# Patient Record
Sex: Female | Born: 1950 | Race: Black or African American | Hispanic: No | Marital: Married | Smoking: Never smoker
Health system: Southern US, Community
[De-identification: ages and names within clinical notes are randomized; demographics above are authoritative.]

## PROBLEM LIST (undated history)

## (undated) DIAGNOSIS — G238 Other specified degenerative diseases of basal ganglia: Secondary | ICD-10-CM

## (undated) HISTORY — DX: Other specified degenerative diseases of basal ganglia: G23.8

---

## 2001-09-13 ENCOUNTER — Inpatient Hospital Stay (HOSPITAL_COMMUNITY): Admission: EM | Admit: 2001-09-13 | Discharge: 2001-09-16 | Payer: Self-pay | Admitting: Emergency Medicine

## 2001-09-14 ENCOUNTER — Encounter: Payer: Self-pay | Admitting: Emergency Medicine

## 2001-09-14 ENCOUNTER — Encounter (HOSPITAL_BASED_OUTPATIENT_CLINIC_OR_DEPARTMENT_OTHER): Payer: Self-pay | Admitting: Internal Medicine

## 2001-09-15 ENCOUNTER — Encounter (HOSPITAL_BASED_OUTPATIENT_CLINIC_OR_DEPARTMENT_OTHER): Payer: Self-pay | Admitting: Internal Medicine

## 2001-09-30 ENCOUNTER — Encounter: Admission: RE | Admit: 2001-09-30 | Discharge: 2001-09-30 | Payer: Self-pay | Admitting: Internal Medicine

## 2001-10-06 ENCOUNTER — Ambulatory Visit (HOSPITAL_COMMUNITY): Admission: RE | Admit: 2001-10-06 | Discharge: 2001-10-06 | Payer: Self-pay | Admitting: Internal Medicine

## 2002-04-13 ENCOUNTER — Encounter: Admission: RE | Admit: 2002-04-13 | Discharge: 2002-04-13 | Payer: Self-pay | Admitting: Internal Medicine

## 2002-07-10 ENCOUNTER — Encounter: Admission: RE | Admit: 2002-07-10 | Discharge: 2002-07-10 | Payer: Self-pay | Admitting: Internal Medicine

## 2002-07-11 ENCOUNTER — Encounter: Admission: RE | Admit: 2002-07-11 | Discharge: 2002-07-11 | Payer: Self-pay | Admitting: Internal Medicine

## 2002-08-14 ENCOUNTER — Other Ambulatory Visit: Admission: RE | Admit: 2002-08-14 | Discharge: 2002-08-14 | Payer: Self-pay | Admitting: Internal Medicine

## 2002-08-14 ENCOUNTER — Encounter: Admission: RE | Admit: 2002-08-14 | Discharge: 2002-08-14 | Payer: Self-pay | Admitting: Internal Medicine

## 2002-10-09 ENCOUNTER — Ambulatory Visit (HOSPITAL_COMMUNITY): Admission: RE | Admit: 2002-10-09 | Discharge: 2002-10-09 | Payer: Self-pay | Admitting: Internal Medicine

## 2002-11-27 ENCOUNTER — Encounter: Admission: RE | Admit: 2002-11-27 | Discharge: 2002-11-27 | Payer: Self-pay | Admitting: Internal Medicine

## 2002-12-01 ENCOUNTER — Encounter: Admission: RE | Admit: 2002-12-01 | Discharge: 2002-12-01 | Payer: Self-pay | Admitting: Internal Medicine

## 2003-04-13 ENCOUNTER — Encounter: Admission: RE | Admit: 2003-04-13 | Discharge: 2003-04-13 | Payer: Self-pay | Admitting: Internal Medicine

## 2003-05-30 ENCOUNTER — Ambulatory Visit (HOSPITAL_COMMUNITY): Admission: RE | Admit: 2003-05-30 | Discharge: 2003-05-30 | Payer: Self-pay | Admitting: Neurology

## 2003-08-06 ENCOUNTER — Encounter: Admission: RE | Admit: 2003-08-06 | Discharge: 2003-08-06 | Payer: Self-pay | Admitting: Internal Medicine

## 2003-08-22 ENCOUNTER — Encounter: Admission: RE | Admit: 2003-08-22 | Discharge: 2003-08-22 | Payer: Self-pay | Admitting: Internal Medicine

## 2003-11-26 ENCOUNTER — Encounter (INDEPENDENT_AMBULATORY_CARE_PROVIDER_SITE_OTHER): Payer: Self-pay | Admitting: Specialist

## 2003-11-26 ENCOUNTER — Encounter: Admission: RE | Admit: 2003-11-26 | Discharge: 2003-11-26 | Payer: Self-pay | Admitting: Internal Medicine

## 2003-12-05 ENCOUNTER — Encounter: Admission: RE | Admit: 2003-12-05 | Discharge: 2003-12-05 | Payer: Self-pay | Admitting: Internal Medicine

## 2004-04-02 ENCOUNTER — Ambulatory Visit: Payer: Self-pay | Admitting: Internal Medicine

## 2004-04-15 ENCOUNTER — Ambulatory Visit: Payer: Self-pay | Admitting: Internal Medicine

## 2004-08-20 ENCOUNTER — Ambulatory Visit: Payer: Self-pay | Admitting: Internal Medicine

## 2004-09-16 ENCOUNTER — Ambulatory Visit (HOSPITAL_COMMUNITY): Admission: RE | Admit: 2004-09-16 | Discharge: 2004-09-16 | Payer: Self-pay | Admitting: Internal Medicine

## 2005-04-08 ENCOUNTER — Ambulatory Visit: Payer: Self-pay | Admitting: Internal Medicine

## 2005-04-23 ENCOUNTER — Ambulatory Visit: Payer: Self-pay | Admitting: Internal Medicine

## 2005-07-05 ENCOUNTER — Emergency Department (HOSPITAL_COMMUNITY): Admission: EM | Admit: 2005-07-05 | Discharge: 2005-07-05 | Payer: Self-pay | Admitting: Emergency Medicine

## 2005-09-24 ENCOUNTER — Ambulatory Visit (HOSPITAL_COMMUNITY): Admission: RE | Admit: 2005-09-24 | Discharge: 2005-09-24 | Payer: Self-pay | Admitting: Internal Medicine

## 2005-10-30 ENCOUNTER — Ambulatory Visit: Payer: Self-pay | Admitting: Internal Medicine

## 2005-10-30 LAB — CONVERTED CEMR LAB: Pap Smear: NEGATIVE

## 2006-02-26 ENCOUNTER — Ambulatory Visit: Payer: Self-pay | Admitting: Internal Medicine

## 2006-03-01 ENCOUNTER — Encounter (INDEPENDENT_AMBULATORY_CARE_PROVIDER_SITE_OTHER): Payer: Self-pay | Admitting: *Deleted

## 2006-03-01 ENCOUNTER — Ambulatory Visit: Payer: Self-pay | Admitting: Hospitalist

## 2006-03-01 LAB — CONVERTED CEMR LAB
Alkaline Phosphatase: 58 units/L (ref 39–117)
BUN: 23 mg/dL (ref 6–23)
CO2: 19 meq/L (ref 19–32)
Cholesterol: 103 mg/dL (ref 0–200)
Creatinine, Ser: 1.43 mg/dL — ABNORMAL HIGH (ref 0.40–1.20)
Glucose, Bld: 179 mg/dL — ABNORMAL HIGH (ref 70–99)
HDL: 30 mg/dL — ABNORMAL LOW (ref >39)
Sodium: 144 meq/L (ref 135–145)
Total Bilirubin: 0.2 mg/dL — ABNORMAL LOW (ref 0.3–1.2)
Total Protein: 7.4 g/dL (ref 6.0–8.3)
Triglycerides: 71 mg/dL (ref <150)
VLDL: 14 mg/dL (ref 0–40)

## 2006-05-14 ENCOUNTER — Telehealth: Payer: Self-pay | Admitting: *Deleted

## 2006-06-03 ENCOUNTER — Encounter (INDEPENDENT_AMBULATORY_CARE_PROVIDER_SITE_OTHER): Payer: Self-pay | Admitting: *Deleted

## 2006-06-03 ENCOUNTER — Ambulatory Visit: Payer: Self-pay | Admitting: Internal Medicine

## 2006-06-03 DIAGNOSIS — G238 Other specified degenerative diseases of basal ganglia: Secondary | ICD-10-CM

## 2006-06-03 DIAGNOSIS — R809 Proteinuria, unspecified: Secondary | ICD-10-CM | POA: Insufficient documentation

## 2006-06-03 DIAGNOSIS — K921 Melena: Secondary | ICD-10-CM

## 2006-06-03 LAB — CONVERTED CEMR LAB
Albumin: 4.6 g/dL (ref 3.5–5.2)
BUN: 14 mg/dL (ref 6–23)
CO2: 24 meq/L (ref 19–32)
Glucose, Bld: 145 mg/dL — ABNORMAL HIGH (ref 70–99)
Sodium: 140 meq/L (ref 135–145)
Total Bilirubin: 0.3 mg/dL (ref 0.3–1.2)
Total Protein: 7.5 g/dL (ref 6.0–8.3)

## 2006-06-14 ENCOUNTER — Ambulatory Visit: Payer: Self-pay | Admitting: Hospitalist

## 2006-09-13 ENCOUNTER — Telehealth (INDEPENDENT_AMBULATORY_CARE_PROVIDER_SITE_OTHER): Payer: Self-pay | Admitting: *Deleted

## 2006-09-27 ENCOUNTER — Ambulatory Visit: Payer: Self-pay | Admitting: Internal Medicine

## 2006-09-27 ENCOUNTER — Encounter (INDEPENDENT_AMBULATORY_CARE_PROVIDER_SITE_OTHER): Payer: Self-pay | Admitting: *Deleted

## 2006-09-27 LAB — CONVERTED CEMR LAB
Blood Glucose, Fingerstick: 222
Hgb A1c MFr Bld: 7.5 %

## 2006-09-28 ENCOUNTER — Encounter (INDEPENDENT_AMBULATORY_CARE_PROVIDER_SITE_OTHER): Payer: Self-pay | Admitting: *Deleted

## 2006-09-28 LAB — CONVERTED CEMR LAB
BUN: 23 mg/dL (ref 6–23)
CO2: 18 meq/L — ABNORMAL LOW (ref 19–32)
Chloride: 111 meq/L (ref 96–112)
Creatinine, Ser: 1.39 mg/dL — ABNORMAL HIGH (ref 0.40–1.20)
Creatinine, Urine: 73.8 mg/dL

## 2006-09-30 ENCOUNTER — Ambulatory Visit (HOSPITAL_COMMUNITY): Admission: RE | Admit: 2006-09-30 | Discharge: 2006-09-30 | Payer: Self-pay | Admitting: Internal Medicine

## 2006-10-01 ENCOUNTER — Encounter (INDEPENDENT_AMBULATORY_CARE_PROVIDER_SITE_OTHER): Payer: Self-pay | Admitting: *Deleted

## 2006-10-01 ENCOUNTER — Ambulatory Visit: Payer: Self-pay | Admitting: Internal Medicine

## 2006-10-03 LAB — CONVERTED CEMR LAB
BUN: 39 mg/dL — ABNORMAL HIGH (ref 6–23)
Creatinine, Ser: 1.64 mg/dL — ABNORMAL HIGH (ref 0.40–1.20)

## 2006-10-04 ENCOUNTER — Encounter (INDEPENDENT_AMBULATORY_CARE_PROVIDER_SITE_OTHER): Payer: Self-pay | Admitting: *Deleted

## 2006-10-04 ENCOUNTER — Ambulatory Visit: Payer: Self-pay | Admitting: Internal Medicine

## 2006-10-04 LAB — CONVERTED CEMR LAB
Creatinine, Urine: 89.4 mg/dL
Nitrite: POSITIVE — AB
Sodium, Ur: 78 meq/L
Specific Gravity, Urine: 1.015 (ref 1.005–1.03)
Urobilinogen, UA: 0.2 (ref 0.0–1.0)

## 2006-10-05 LAB — CONVERTED CEMR LAB
Potassium: 4.6 meq/L (ref 3.5–5.3)
Sodium: 142 meq/L (ref 135–145)

## 2006-10-06 ENCOUNTER — Ambulatory Visit: Payer: Self-pay | Admitting: Internal Medicine

## 2006-10-06 ENCOUNTER — Ambulatory Visit (HOSPITAL_COMMUNITY): Admission: RE | Admit: 2006-10-06 | Discharge: 2006-10-06 | Payer: Self-pay | Admitting: Internal Medicine

## 2006-10-06 ENCOUNTER — Encounter (INDEPENDENT_AMBULATORY_CARE_PROVIDER_SITE_OTHER): Payer: Self-pay | Admitting: *Deleted

## 2006-12-15 ENCOUNTER — Telehealth: Payer: Self-pay | Admitting: *Deleted

## 2006-12-17 ENCOUNTER — Ambulatory Visit: Payer: Self-pay | Admitting: Internal Medicine

## 2006-12-17 ENCOUNTER — Telehealth: Payer: Self-pay | Admitting: *Deleted

## 2006-12-17 ENCOUNTER — Encounter (INDEPENDENT_AMBULATORY_CARE_PROVIDER_SITE_OTHER): Payer: Self-pay | Admitting: *Deleted

## 2006-12-17 LAB — CONVERTED CEMR LAB
CO2: 21 meq/L (ref 19–32)
Calcium: 10.4 mg/dL (ref 8.4–10.5)
Creatinine, Ser: 1.25 mg/dL — ABNORMAL HIGH (ref 0.40–1.20)

## 2006-12-29 ENCOUNTER — Ambulatory Visit: Payer: Self-pay | Admitting: Internal Medicine

## 2006-12-29 ENCOUNTER — Encounter (INDEPENDENT_AMBULATORY_CARE_PROVIDER_SITE_OTHER): Payer: Self-pay | Admitting: *Deleted

## 2007-03-28 ENCOUNTER — Ambulatory Visit: Payer: Self-pay | Admitting: Internal Medicine

## 2007-03-28 ENCOUNTER — Encounter (INDEPENDENT_AMBULATORY_CARE_PROVIDER_SITE_OTHER): Payer: Self-pay | Admitting: *Deleted

## 2007-03-28 LAB — CONVERTED CEMR LAB
BUN: 18 mg/dL (ref 6–23)
CO2: 19 meq/L (ref 19–32)
Chloride: 112 meq/L (ref 96–112)
Creatinine, Ser: 1.28 mg/dL — ABNORMAL HIGH (ref 0.40–1.20)
Glucose, Bld: 314 mg/dL — ABNORMAL HIGH (ref 70–99)
Hgb A1c MFr Bld: 7.3 %

## 2007-07-11 ENCOUNTER — Encounter (INDEPENDENT_AMBULATORY_CARE_PROVIDER_SITE_OTHER): Payer: Self-pay | Admitting: *Deleted

## 2007-07-11 ENCOUNTER — Ambulatory Visit: Payer: Self-pay | Admitting: Infectious Diseases

## 2007-07-11 LAB — CONVERTED CEMR LAB: Hgb A1c MFr Bld: 8 %

## 2007-07-22 ENCOUNTER — Encounter (INDEPENDENT_AMBULATORY_CARE_PROVIDER_SITE_OTHER): Payer: Self-pay | Admitting: *Deleted

## 2007-07-22 ENCOUNTER — Ambulatory Visit: Payer: Self-pay | Admitting: Hospitalist

## 2007-07-25 LAB — CONVERTED CEMR LAB
BUN: 27 mg/dL — ABNORMAL HIGH (ref 6–23)
CO2: 18 meq/L — ABNORMAL LOW (ref 19–32)
Calcium: 10.4 mg/dL (ref 8.4–10.5)
Creatinine, Ser: 1.39 mg/dL — ABNORMAL HIGH (ref 0.40–1.20)
Glucose, Bld: 183 mg/dL — ABNORMAL HIGH (ref 70–99)

## 2007-07-29 ENCOUNTER — Telehealth (INDEPENDENT_AMBULATORY_CARE_PROVIDER_SITE_OTHER): Payer: Self-pay | Admitting: *Deleted

## 2007-09-22 ENCOUNTER — Encounter (INDEPENDENT_AMBULATORY_CARE_PROVIDER_SITE_OTHER): Payer: Self-pay | Admitting: *Deleted

## 2007-10-04 ENCOUNTER — Ambulatory Visit (HOSPITAL_COMMUNITY): Admission: RE | Admit: 2007-10-04 | Discharge: 2007-10-04 | Payer: Self-pay | Admitting: Internal Medicine

## 2007-10-24 ENCOUNTER — Encounter (INDEPENDENT_AMBULATORY_CARE_PROVIDER_SITE_OTHER): Payer: Self-pay | Admitting: *Deleted

## 2007-10-24 ENCOUNTER — Ambulatory Visit: Payer: Self-pay | Admitting: Internal Medicine

## 2007-10-24 DIAGNOSIS — N949 Unspecified condition associated with female genital organs and menstrual cycle: Secondary | ICD-10-CM | POA: Insufficient documentation

## 2007-10-24 LAB — CONVERTED CEMR LAB
Blood Glucose, Fingerstick: 211
Hgb A1c MFr Bld: 7.5 %

## 2008-01-30 ENCOUNTER — Telehealth (INDEPENDENT_AMBULATORY_CARE_PROVIDER_SITE_OTHER): Payer: Self-pay | Admitting: Internal Medicine

## 2008-02-16 ENCOUNTER — Encounter (INDEPENDENT_AMBULATORY_CARE_PROVIDER_SITE_OTHER): Payer: Self-pay | Admitting: Internal Medicine

## 2008-02-16 ENCOUNTER — Ambulatory Visit: Payer: Self-pay | Admitting: Internal Medicine

## 2008-02-16 DIAGNOSIS — G4700 Insomnia, unspecified: Secondary | ICD-10-CM | POA: Insufficient documentation

## 2008-02-16 LAB — CONVERTED CEMR LAB
Blood Glucose, Fingerstick: 359
Hgb A1c MFr Bld: 8 %

## 2008-02-21 ENCOUNTER — Ambulatory Visit: Payer: Self-pay | Admitting: Internal Medicine

## 2008-02-21 LAB — CONVERTED CEMR LAB: Blood Glucose, Fingerstick: 196

## 2008-03-07 ENCOUNTER — Encounter (INDEPENDENT_AMBULATORY_CARE_PROVIDER_SITE_OTHER): Payer: Self-pay | Admitting: Internal Medicine

## 2008-03-15 ENCOUNTER — Telehealth (INDEPENDENT_AMBULATORY_CARE_PROVIDER_SITE_OTHER): Payer: Self-pay | Admitting: *Deleted

## 2008-03-19 ENCOUNTER — Encounter (INDEPENDENT_AMBULATORY_CARE_PROVIDER_SITE_OTHER): Payer: Self-pay | Admitting: Internal Medicine

## 2008-03-26 ENCOUNTER — Ambulatory Visit: Payer: Self-pay | Admitting: Internal Medicine

## 2008-03-29 ENCOUNTER — Encounter (INDEPENDENT_AMBULATORY_CARE_PROVIDER_SITE_OTHER): Payer: Self-pay | Admitting: Internal Medicine

## 2008-04-02 ENCOUNTER — Telehealth (INDEPENDENT_AMBULATORY_CARE_PROVIDER_SITE_OTHER): Payer: Self-pay | Admitting: Internal Medicine

## 2008-04-03 ENCOUNTER — Encounter (INDEPENDENT_AMBULATORY_CARE_PROVIDER_SITE_OTHER): Payer: Self-pay | Admitting: Internal Medicine

## 2008-06-08 ENCOUNTER — Ambulatory Visit: Payer: Self-pay | Admitting: Internal Medicine

## 2008-06-08 DIAGNOSIS — F329 Major depressive disorder, single episode, unspecified: Secondary | ICD-10-CM | POA: Insufficient documentation

## 2008-06-08 LAB — CONVERTED CEMR LAB
Blood Glucose, Fingerstick: 267
Hgb A1c MFr Bld: 8.7 %

## 2008-07-09 ENCOUNTER — Ambulatory Visit: Payer: Self-pay | Admitting: Internal Medicine

## 2008-07-09 LAB — CONVERTED CEMR LAB: Blood Glucose, Fingerstick: 186

## 2008-09-07 ENCOUNTER — Encounter (INDEPENDENT_AMBULATORY_CARE_PROVIDER_SITE_OTHER): Payer: Self-pay | Admitting: Internal Medicine

## 2008-09-07 ENCOUNTER — Ambulatory Visit: Payer: Self-pay | Admitting: *Deleted

## 2008-09-07 LAB — CONVERTED CEMR LAB: Blood Glucose, Fingerstick: 188

## 2008-09-10 LAB — CONVERTED CEMR LAB
Calcium: 11 mg/dL — ABNORMAL HIGH (ref 8.4–10.5)
Cholesterol: 127 mg/dL (ref 0–200)
Creatinine, Ser: 1.4 mg/dL — ABNORMAL HIGH (ref 0.40–1.20)
Creatinine, Urine: 83 mg/dL
GFR calc Af Amer: 46 mL/min — ABNORMAL LOW (ref >60)
Hemoglobin: 11.2 g/dL — ABNORMAL LOW (ref 12.0–15.0)
Ketones, ur: NEGATIVE mg/dL
LDL Cholesterol: 74 mg/dL (ref 0–99)
MCHC: 32.7 g/dL (ref 30.0–36.0)
MCV: 83 fL (ref 78.0–100.0)
Microalb, Ur: 5.84 mg/dL — ABNORMAL HIGH (ref 0.00–1.89)
Nitrite: NEGATIVE
RBC / HPF: NONE SEEN (ref <3)
RDW: 13.8 % (ref 11.5–15.5)
Sodium: 143 meq/L (ref 135–145)
Specific Gravity, Urine: 1.013 (ref 1.005–1.030)
Triglycerides: 117 mg/dL (ref <150)
Urobilinogen, UA: 0.2 (ref 0.0–1.0)
VLDL: 23 mg/dL (ref 0–40)
pH: 6 (ref 5.0–8.0)

## 2008-09-14 ENCOUNTER — Ambulatory Visit: Payer: Self-pay | Admitting: Infectious Disease

## 2008-09-14 LAB — FECAL OCCULT BLOOD, GUAIAC: Fecal Occult Blood: NEGATIVE

## 2008-09-27 LAB — CONVERTED CEMR LAB: OCCULT 2: NEGATIVE

## 2008-10-08 ENCOUNTER — Ambulatory Visit (HOSPITAL_COMMUNITY): Admission: RE | Admit: 2008-10-08 | Discharge: 2008-10-08 | Payer: Self-pay | Admitting: Internal Medicine

## 2008-10-08 LAB — HM MAMMOGRAPHY: HM Mammogram: NEGATIVE

## 2008-10-25 ENCOUNTER — Encounter (INDEPENDENT_AMBULATORY_CARE_PROVIDER_SITE_OTHER): Payer: Self-pay | Admitting: Internal Medicine

## 2008-10-25 ENCOUNTER — Ambulatory Visit: Payer: Self-pay | Admitting: Internal Medicine

## 2008-10-25 LAB — CONVERTED CEMR LAB: Calcium: 10.1 mg/dL (ref 8.4–10.5)

## 2009-01-16 ENCOUNTER — Encounter (INDEPENDENT_AMBULATORY_CARE_PROVIDER_SITE_OTHER): Payer: Self-pay | Admitting: Internal Medicine

## 2009-03-11 ENCOUNTER — Encounter (INDEPENDENT_AMBULATORY_CARE_PROVIDER_SITE_OTHER): Payer: Self-pay | Admitting: Internal Medicine

## 2009-03-18 ENCOUNTER — Ambulatory Visit (HOSPITAL_COMMUNITY): Admission: RE | Admit: 2009-03-18 | Discharge: 2009-03-18 | Payer: Self-pay | Admitting: Internal Medicine

## 2009-03-18 ENCOUNTER — Encounter (INDEPENDENT_AMBULATORY_CARE_PROVIDER_SITE_OTHER): Payer: Self-pay | Admitting: Internal Medicine

## 2009-03-19 ENCOUNTER — Encounter (INDEPENDENT_AMBULATORY_CARE_PROVIDER_SITE_OTHER): Payer: Self-pay | Admitting: Internal Medicine

## 2009-03-19 LAB — HM DIABETES EYE EXAM

## 2009-04-04 ENCOUNTER — Telehealth: Payer: Self-pay | Admitting: *Deleted

## 2009-04-04 DIAGNOSIS — M949 Disorder of cartilage, unspecified: Secondary | ICD-10-CM

## 2009-04-04 DIAGNOSIS — M899 Disorder of bone, unspecified: Secondary | ICD-10-CM | POA: Insufficient documentation

## 2009-04-11 ENCOUNTER — Ambulatory Visit: Payer: Self-pay | Admitting: Internal Medicine

## 2009-04-11 ENCOUNTER — Encounter (INDEPENDENT_AMBULATORY_CARE_PROVIDER_SITE_OTHER): Payer: Self-pay | Admitting: Internal Medicine

## 2009-04-11 DIAGNOSIS — D649 Anemia, unspecified: Secondary | ICD-10-CM | POA: Insufficient documentation

## 2009-04-11 LAB — CONVERTED CEMR LAB
Bilirubin Urine: NEGATIVE
Blood Glucose, Fingerstick: 231
Glucose, Urine, Semiquant: 100
Hgb A1c MFr Bld: 8.6 %
Specific Gravity, Urine: 1.015

## 2009-04-16 LAB — CONVERTED CEMR LAB
Bilirubin Urine: NEGATIVE
CO2: 18 meq/L — ABNORMAL LOW (ref 19–32)
Casts: NONE SEEN /lpf
Ferritin: 22 ng/mL (ref 10–291)
Glucose, Bld: 274 mg/dL — ABNORMAL HIGH (ref 70–99)
Ketones, ur: NEGATIVE mg/dL
MCHC: 33.1 g/dL (ref 30.0–36.0)
Microalb Creat Ratio: 136.5 mg/g — ABNORMAL HIGH (ref 0.0–30.0)
Nitrite: NEGATIVE
Potassium: 4.1 meq/L (ref 3.5–5.3)
RBC / HPF: NONE SEEN (ref <3)
RBC: 3.94 M/uL (ref 3.87–5.11)
Sodium: 140 meq/L (ref 135–145)
TSH: 2.948 microintl units/mL (ref 0.350–4.5)
Vitamin B-12: 422 pg/mL (ref 211–911)
WBC: 9.7 10*3/uL (ref 4.0–10.5)
pH: 5.5 (ref 5.0–8.0)

## 2009-04-29 ENCOUNTER — Ambulatory Visit: Payer: Self-pay | Admitting: Internal Medicine

## 2009-04-29 LAB — CONVERTED CEMR LAB
Blood Glucose, Fingerstick: 218
Calcium: 10.8 mg/dL — ABNORMAL HIGH (ref 8.4–10.5)
Creatinine, Ser: 1.43 mg/dL — ABNORMAL HIGH (ref 0.40–1.20)

## 2009-05-01 ENCOUNTER — Telehealth: Payer: Self-pay | Admitting: *Deleted

## 2009-05-01 ENCOUNTER — Telehealth (INDEPENDENT_AMBULATORY_CARE_PROVIDER_SITE_OTHER): Payer: Self-pay | Admitting: Internal Medicine

## 2009-05-03 ENCOUNTER — Telehealth (INDEPENDENT_AMBULATORY_CARE_PROVIDER_SITE_OTHER): Payer: Self-pay | Admitting: *Deleted

## 2009-05-06 ENCOUNTER — Telehealth (INDEPENDENT_AMBULATORY_CARE_PROVIDER_SITE_OTHER): Payer: Self-pay | Admitting: Internal Medicine

## 2009-05-15 ENCOUNTER — Encounter (INDEPENDENT_AMBULATORY_CARE_PROVIDER_SITE_OTHER): Payer: Self-pay | Admitting: Internal Medicine

## 2009-05-17 ENCOUNTER — Ambulatory Visit: Payer: Self-pay | Admitting: Internal Medicine

## 2009-05-17 LAB — CONVERTED CEMR LAB
AST: 20 units/L (ref 0–37)
Albumin: 4.2 g/dL (ref 3.5–5.2)
Alkaline Phosphatase: 156 units/L — ABNORMAL HIGH (ref 39–117)
Blood Glucose, Fingerstick: 305
LDL Cholesterol: 55 mg/dL (ref 0–99)
Potassium: 4.4 meq/L (ref 3.5–5.3)
Sodium: 140 meq/L (ref 135–145)
Total Bilirubin: 0.4 mg/dL (ref 0.3–1.2)
Total Protein: 7.2 g/dL (ref 6.0–8.3)
VLDL: 18 mg/dL (ref 0–40)

## 2009-05-20 ENCOUNTER — Telehealth (INDEPENDENT_AMBULATORY_CARE_PROVIDER_SITE_OTHER): Payer: Self-pay | Admitting: Internal Medicine

## 2009-05-24 ENCOUNTER — Telehealth (INDEPENDENT_AMBULATORY_CARE_PROVIDER_SITE_OTHER): Payer: Self-pay | Admitting: Internal Medicine

## 2009-05-29 ENCOUNTER — Encounter (INDEPENDENT_AMBULATORY_CARE_PROVIDER_SITE_OTHER): Payer: Self-pay | Admitting: Internal Medicine

## 2009-05-30 ENCOUNTER — Encounter (INDEPENDENT_AMBULATORY_CARE_PROVIDER_SITE_OTHER): Payer: Self-pay | Admitting: Internal Medicine

## 2009-06-04 ENCOUNTER — Telehealth (INDEPENDENT_AMBULATORY_CARE_PROVIDER_SITE_OTHER): Payer: Self-pay | Admitting: Internal Medicine

## 2009-06-18 ENCOUNTER — Encounter (INDEPENDENT_AMBULATORY_CARE_PROVIDER_SITE_OTHER): Payer: Self-pay | Admitting: Internal Medicine

## 2009-09-03 ENCOUNTER — Telehealth (INDEPENDENT_AMBULATORY_CARE_PROVIDER_SITE_OTHER): Payer: Self-pay | Admitting: Internal Medicine

## 2010-05-08 ENCOUNTER — Telehealth: Payer: Self-pay | Admitting: Internal Medicine

## 2010-05-25 LAB — CONVERTED CEMR LAB
CO2: 20 meq/L (ref 19–32)
Calcium: 10.2 mg/dL (ref 8.4–10.5)
Glucose, Bld: 325 mg/dL — ABNORMAL HIGH (ref 70–99)
Sodium: 140 meq/L (ref 135–145)

## 2010-05-27 NOTE — Procedures (Signed)
Summary: [facility]: Colonoscopy  [facility]   Imported By: Florinda Marker 05/22/2009 14:17:25  _____________________________________________________________________  External Attachment:    Type:   Image     Comment:   External Document

## 2010-05-27 NOTE — Progress Notes (Signed)
Summary: new to insulin/diabete support/dmr  Phone Note Outgoing Call   Call placed by: Jamison Neighbor RD,CDE,  May 03, 2009 10:37 AM Summary of Call: called to follow-up on new to insulin and blood sugars: "doing good" per patient, CBGs in ams: 285 first day- but hadn't taken insulin yet,  05/01/08(first day after 9 units)- 238,194  05/02/08- 225 Discussed with patient that she might need to increase her dose. offered to route this note to discuss insulin increase with Dr Clent Ridges- However, patient  wants to wait until next week to see if CBgs decrease further. She agreed to call me early next week with additional CBg readings.

## 2010-05-27 NOTE — Progress Notes (Signed)
Summary: blood sugars improving on new dose/dmr  Phone Note Outgoing Call   Call placed by: Jamison Neighbor RD,CDE,  May 24, 2009 9:37 AM Summary of Call: returned patient call about blood sugars: using 25 units lantus instead of 24 by mistake. CBgs are as follows: all before meals 1/28=  82 this am 1/27=154, 263, 137 1/26= 278, 313, 223  advised her to continue with 25 units and call if symptoms of hypoglycemia- that are relieved with caloric intake no matter what her blood sugar. (Reviewed symptoms), or nay other questions or concerns. She did ask that we check into her pen needle prescription because she had to pay 44.00 for the 100 needles.    Follow-up for Phone Call         [facility] called [facility] [address] and spoke to pharmacist who said Genifer only paid 4 dollars for the pen needles and the way the prescription is written is fine. Follow-up by: Jamison Neighbor RD,CDE,  May 24, 2009 11:09 AM  Additional Follow-up for Phone Call Additional follow up Details #1::        cbg's sound great. thank you donna for speaking with Ms. Tobler about her cbg's and for following up on the pen needle question.

## 2010-05-27 NOTE — Progress Notes (Signed)
Summary: CBGs high/new lantus dose/dmr  Phone Note Call from Patient   Caller: Patient Summary of Call: patient called early to see if she could have her insulin adjusted today because her sugar is still high:  CBgs 340 this am before eating or drinking, yesterday evening 321 Sunday:am 285, 321- went to churchand fixed dinner, watched TV. Saturday: 302, 197 evening- more physical activity- went to mall, shopped.  Initial call taken by: Jamison Neighbor RD,CDE,  May 06, 2009 9:18 AM  Follow-up for Phone Call        disussed with Dr. Clent Ridges who is in clinic today who advised that patient  increase insulin to 15 units daily. Follow-up by: Jamison Neighbor RD,CDE,  May 06, 2009 9:22 AM  Additional Follow-up for Phone Call Additional follow up Details #1::        Called patient- she repeated back new insulin dose of 15 units- understands to give it 3-5 days for full effectiveness and call us if blood sugars still not satisfactory Additional Follow-up by: Jamison Neighbor RD,CDE,  May 06, 2009 9:24 AM    Additional Follow-up for Phone Call Additional follow up Details #2::    Thank you Lupita Leash.  I agree with this plan.  New/Updated Medications: LANTUS SOLOSTAR 100 UNIT/ML SOLN (INSULIN GLARGINE) Inject 15 units once a day at the same time each day.

## 2010-05-27 NOTE — Assessment & Plan Note (Signed)
Summary: med questions/pcp-Alaja Goldinger/hla   Vital Signs:  Patient profile:   60 year old female Height:      62 inches (157.48 cm) Weight:      135.7 pounds (61.68 kg) BMI:     24.91 Temp:     97.8 degrees F (36.56 degrees C) oral Pulse rate:   86 / minute BP sitting:   109 / 77  (right arm)  Vitals Entered By: Stanton Kidney Ditzler RN (April 29, 2009 2:21 PM) Is Patient Diabetic? Yes Did you bring your meter with you today? No Pain Assessment Patient in pain? no      Nutritional Status BMI of 19 -24 = normal Nutritional Status Detail appetite good CBG Result 218  Have you ever been in a relationship where you felt threatened, hurt or afraid?denies   Does patient need assistance? Functional Status Self care Ambulation Normal Comments Discuss meds.   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  I have recently stopped her metformin due to concern about metabolic acidosis and renal insufficiency.  I then increased her glypizide to control cbg's.  I also increased her lisinopril in response to a high microalb/cr ratio.  She stopped the metformin and then her cbg's went out of control, so she started taking it again.  She wants to know what to do.     Depression History:     The patient denies a depressed mood most of the day and a diminished interest in her usual daily activities.         Preventive Screening-Counseling & Management  Alcohol-Tobacco     Alcohol drinks/day: 0     Smoking Status: quit     Year Quit: 1969  Caffeine-Diet-Exercise     Does Patient Exercise: yes     Type  of exercise: walking  Current Medications (verified): 1)  Lisinopril 10 Mg Tabs (Lisinopril) .... Take One Tablet Daily. 2)  Aspir-Low 81 Mg Tbec (Aspirin) .... Take 1 Tablet By Mouth Once A Day 3)  Bayer Contour Test  Strp (Glucose Blood) 4)  Glipizide 10 Mg Tabs (Glipizide) .... Take One Tablet Two Times A Day For Diabetes. 5)  Oyster Calcium + D 500-125 Mg-Unit Tabs (Calcium-Vitamin D) .... Take Two Tablets Daily. 6)  Ferrous Sulfate 325 (65 Fe) Mg Tabs (Ferrous Sulfate) .... Take One Tablet Two Times A Day.  Allergies: No Known Drug Allergies  Review of Systems       per hpi  Physical Exam  General:  no exam.  Diabetes Management Exam:    Foot Exam (with socks and/or shoes not present):       Sensory-Monofilament:          Left foot: normal          Right foot: normal   Impression & Recommendations:  Problem # 1:  DIABETES MELLITUS, TYPE II (ICD-250.00) Off metformin due to metabolic acidosis and renal dysfunction. Glypizide alone not controling cbg's. Will stop pills and start insulin today. start lantus at 0.15u/kgx 61kg= 9 units daily. rtc in 2 weeks to see how it is going. keep checking cbg's two times a day. visit with DR asap to go over insulin use. BMET today to be sure lisinopril is not worsening CRI.  The following medications were removed from the medication list:    Glipizide 10 Mg Tabs (Glipizide) .Marland Kitchen... Take one tablet two times a day for diabetes. Her updated medication list for this problem includes:    Lisinopril 10 Mg Tabs (Lisinopril) .Marland Kitchen... Take one tablet daily.    Aspir-low 81 Mg Tbec (Aspirin) .Marland Kitchen... Take 1 tablet by mouth once  a day    Lantus Solostar 100 Unit/ml Soln (Insulin glargine) ..... Inject 9 units once a day at the same time each day.  Orders: Capillary Blood Glucose/CBG ([address]) Diabetic Clinic Referral (Diabetic) T-Basic Metabolic Panel ([id number])  Problem # 2:  RENAL INSUFFICIENCY, CHRONIC (ICD-585.9) Cr increased and nongap acidosis.  CRI likely due to DM.  Have stopped metformin.  Concerned that ACE will worsen CRI, but may help protenuria.  Will monitor Cr carefully.  Labs Reviewed: BUN: 15 (04/11/2009)   Cr: 1.24 (04/11/2009)    Hgb: 11.2 (04/11/2009)   Hct: 33.8 (04/11/2009)   Ca++: 10.5 (04/11/2009)    TP: 7.5 (06/03/2006)   Alb: 4.6 (06/03/2006)  Complete Medication List: 1)  Lisinopril 10 Mg Tabs (Lisinopril) .... Take one tablet daily. 2)  Aspir-low 81 Mg Tbec (Aspirin) .... Take 1 tablet by mouth once a day 3)  Bayer Contour Test Strp (Glucose blood) 4)  Oyster Calcium + D 500-125 Mg-unit Tabs (Calcium-vitamin d) .... Take two tablets daily. 5)  Ferrous Sulfate 325 (65 Fe) Mg Tabs (Ferrous sulfate) .... Take one tablet two times a day. 6)  Lantus Solostar 100 Unit/ml Soln (Insulin glargine) .... Inject 9 units once a day at the same time each day.  Patient Instructions: 1)  Please make a follow up appointment in 2 weeks. 2)  Please make an appointment with Jamison Neighbor as soon as possible. 3)  You have a new prescription for Lantus insulin.  You should inject 9 units at the same time each day.  4)  Stop taking metformin and glypizide. 5)  Continue to check your blood sugar three times a day. Prescriptions: LANTUS SOLOSTAR 100  UNIT/ML SOLN (INSULIN GLARGINE) Inject 9 units once a day at the same time each day.  #1 box x 3   Entered and Authorized by:   Elby Showers MD   Signed by:   Elby Showers MD on 04/29/2009   Method used:   [facility]        [facility]  [address] [phone]* (retail)       [address]       [place], [place]   [id number]       Ph: [phone] or [phone]       Fax: [phone]   RxID:   [id number]  Process Orders Check Orders Results:     [facility]: ABN not required for this insurance Tests Sent for requisitioning (April 29, 2009 3:12 PM):     04/29/2009: [facility] -- T-Basic Metabolic Panel [phone] (signed)    Prevention & Chronic Care Immunizations   Influenza vaccine: Not documented    Tetanus booster: Not documented    Pneumococcal vaccine: Not documented    H. zoster vaccine: Not documented  Colorectal Screening   Hemoccult: Not documented    Colonoscopy: Not documented   Colonoscopy action/deferral: GI referral  (10/25/2008)  Other Screening   Pap smear: NEGATIVE FOR INTRAEPITHELIAL LESIONS OR MALIGNANCY. BENIGN REACTIVE/REPARATIVE CHANGES  (10/24/2007)   Pap smear due: 10/2007    Mammogram: ASSESSMENT: Negative - BI-RADS 1^MM DIGITAL SCREENING  (10/08/2008)   Mammogram due: 09/2007    DXA bone density scan: Not documented   DXA bone density action/deferral: Ordered  (10/25/2008)   Smoking status: quit  (04/29/2009)  Diabetes Mellitus   HgbA1C: 8.6  (04/11/2009)   HgbA1C action/deferral: Ordered  (10/25/2008)    Eye exam: No diabetic retinopathy.     (03/19/2009)   Diabetic eye exam action/deferral: Ophthalmology referral  (10/25/2008)   Eye exam due: 03/2010    Foot exam: yes  (04/29/2009)   Foot exam action/deferral: Not indicated   High risk foot: Not documented   Foot care education: Not documented   Foot exam due: 10/25/2009    Urine microalbumin/creatinine ratio: 136.5  (04/11/2009)  Lipids   Total Cholesterol: 127  (09/07/2008)   Lipid panel action/deferral: Not indicated   LDL: 74  (09/07/2008)   LDL Direct: Not documented   HDL: 30  (09/07/2008)   Triglycerides: 117  (09/07/2008)  Self-Management Support :    Patient will work on the following items until the next clinic visit to reach  self-care goals:     Medications and monitoring: take my medicines every day, check my blood sugar, check my blood pressure, weigh myself weekly, examine my feet every day  (04/29/2009)     Eating: drink diet soda or water instead of juice or soda, eat more vegetables, use fresh or frozen vegetables, eat foods that are low in salt, eat baked foods instead of fried foods, eat fruit for snacks and desserts  (04/29/2009)     Activity: take a 30 minute walk every day, take the stairs instead of the elevator, park at the far end of the parking lot  (04/29/2009)    Diabetes self-management support: Not documented   Last diabetes self-management training by diabetes educator: 06/15/2008   Referred for diabetes self-mgmt training.   Last medical nutrition therapy: 06/08/2008  Last LDL:  74 (09/07/2008 9:55:00 PM)        Diabetic Foot Exam Foot Inspection Is there a history of a foot ulcer?              No Is there a foot ulcer now?              No Can the patient see the bottom of their feet?          Yes Are the shoes appropriate in style and fit?          Yes Is there swelling or an abnormal foot shape?          No Are the toenails long?                No Are the toenails thick?                No Are the toenails ingrown?              No Is there heavy callous build-up?              No Is there a claw toe deformity?                          No Is there elevated skin temperature?            No Is there limited ankle dorsiflexion?            No Is there foot or ankle muscle weakness?            No Do you have pain in calf while walking?           No         10-g (5.07) Semmes-Weinstein Monofilament Test Performed by: Stanton Kidney Ditzler RN          Right Foot          Left Foot Visual Inspection     normal         normal Test Control      normal         normal Site 1         normal         normal Site 2         normal         normal Site 3          normal         normal Site 4         normal         normal Site 5         normal         normal Site 6         normal         normal Site 7         normal         normal Site 8         normal         normal Site 9         normal         normal Site 10         normal         normal  Impression      normal         normal   Appended Document: med questions/pcp-Talise Sligh/hla    Clinical Lists Changes  Medications: Added  new medication of PEN NEEDLES 5/16" 31G X 8 MM MISC (INSULIN PEN NEEDLE) use to inject insulin once daily- do not leave needle on pen, do not reuse. - Signed Rx of PEN NEEDLES 5/16" 31G X 8 MM MISC (INSULIN PEN NEEDLE) use to inject insulin once daily- do not leave needle on pen, do not reuse.;  #100 x 3;  Signed;  Entered by: Jamison Neighbor RD,CDE;  Authorized by: Elby Showers MD;  Method used: Electronically to [facility] [address], [place], [place]  [id number], Ph: [phone], Fax: [phone]    Prescriptions: PEN NEEDLES 5/16" 31G X 8 MM MISC (INSULIN PEN NEEDLE) use to inject insulin once daily- do not leave needle on pen, do not reuse.  #100 x 3   Entered by:   Jamison Neighbor RD,CDE   Authorized by:   Elby Showers MD   Signed by:   Elby Showers MD on 04/29/2009   Method used:   Electronically to        [facility] (retail)       [address]       [place], [place]  [address]       Ph: [phone]       Fax: [phone]   RxID:   [id number]

## 2010-05-27 NOTE — Progress Notes (Signed)
Summary: refill/gg  Phone Note Refill Request  on June 04, 2009 2:24 PM  Refills Requested: Medication #1:  LISINOPRIL 10 MG TABS Take one tablet daily.  Method Requested: Electronic Initial call taken by: Merrie Roof RN,  June 04, 2009 2:25 PM    Prescriptions: LISINOPRIL 10 MG TABS (LISINOPRIL) Take one tablet daily.  #32 x 2   Entered and Authorized by:   Elby Showers MD   Signed by:   Elby Showers MD on 06/05/2009   Method used:   [facility]        [facility]  [address] [phone]* (retail)       [address] [place] [address]       [place], [place]  [id number]       Ph: [phone] or [phone]       Fax: [phone]   RxID:   [id number] LISINOPRIL 10 MG TABS (LISINOPRIL) Take one tablet daily.  #32 x 2   Entered and Authorized by:   Elby Showers MD   Signed by:   Elby Showers MD on 06/05/2009   Method used:   [facility] ...       [facility]       [address]       [place], [place]  [address]       Ph: [phone]       Fax: [phone]   RxID:   [id number]

## 2010-05-27 NOTE — Consult Note (Signed)
Summary: [facility]  [facility]   Imported By: Florinda Marker 06/04/2009 16:02:19  _____________________________________________________________________  External Attachment:    Type:   Image     Comment:   External Document

## 2010-05-27 NOTE — Progress Notes (Signed)
Summary: refill/gg  Phone Note Refill Request  on Sep 03, 2009 2:25 PM  Refills Requested: Medication #1:  LISINOPRIL 10 MG TABS Take one tablet daily.   Last Refilled: 08/03/2009  Method Requested: Electronic Initial call taken by: Merrie Roof RN,  Sep 03, 2009 2:26 PM    Prescriptions: LISINOPRIL 10 MG TABS (LISINOPRIL) Take one tablet daily.  #32 x 6   Entered and Authorized by:   Elby Showers MD   Signed by:   Elby Showers MD on 09/06/2009   Method used:   [facility]        [facility]  [address] [phone]* (retail)       [address]       [place], [place]  [address]       Ph: [phone] or [phone]       Fax: [phone]   RxID:   [id number]

## 2010-05-27 NOTE — Miscellaneous (Signed)
  Clinical Lists Changes  Medications: Changed medication from PEN NEEDLES 5/16" 31G X 8 MM MISC (INSULIN PEN NEEDLE) use to inject insulin once daily- do not leave needle on pen, do not reuse. to BD PEN NEEDLE SHORT U/F 31G X 8 MM MISC (INSULIN PEN NEEDLE) Use insulin as directed - Signed Rx of BD PEN NEEDLE SHORT U/F 31G X 8 MM MISC (INSULIN PEN NEEDLE) Use insulin as directed;  #60 x 6;  Signed;  Entered by: Elby Showers MD;  Authorized by: Elby Showers MD;  Method used: Electronically to [facility] [address], [place], [place]  [id number], Ph: [phone], Fax: [phone]    Prescriptions: BD PEN NEEDLE SHORT U/F 31G X 8 MM MISC (INSULIN PEN NEEDLE) Use insulin as directed  #60 x 6   Entered and Authorized by:   Elby Showers MD   Signed by:   Elby Showers MD on 05/29/2009   Method used:   Electronically to        [facility] (retail)       [address]       [place], [place]  [id number]       Ph: [phone]       Fax: [phone]   RxID:   [id number]

## 2010-05-27 NOTE — Miscellaneous (Signed)
Summary: [facility]   Imported By: Florinda Marker 06/19/2009 16:14:32  _____________________________________________________________________  External Attachment:    Type:   Image     Comment:   External Document

## 2010-05-27 NOTE — Assessment & Plan Note (Signed)
Summary: 2WK F/U/EST/VS   Vital Signs:  Patient profile:   60 year old female Height:      62 inches (157.48 cm) Weight:      132.2 pounds (60.09 kg) BMI:     24.27 Temp:     97.6 degrees F (36.44 degrees C) oral Pulse rate:   84 / minute BP sitting:   123 / 81  (left arm)  Vitals Entered By: Stanton Kidney Ditzler RN (May 17, 2009 10:20 AM) Is Patient Diabetic? Yes Did you bring your meter with you today? Yes Pain Assessment Patient in pain? no      Nutritional Status BMI of 19 -24 = normal Nutritional Status Detail appetite good CBG Result 305  Have you ever been in a relationship where you felt threatened, hurt or afraid?denies   Does patient need assistance? Functional Status Self care Ambulation Normal Comments FU - CBG are higher since on insulin. Has not taken meds today.   Primary Care Provider:  Thayer Dallas   History of Present Illness: This is a  year old woman with past medical history of DM.  She is here to discuss new medication for dm.  She was started on lantus 9 units 3 weeks ago.  Cbg's were high on this, so dose was increased to 15 units, cbg's have gotten even higher!  She has no symptoms and feels "the same."  Her cbg yesterday afternoon was 600!  Depression History:      The patient denies a depressed mood most of the day and a diminished interest in her usual daily activities.         Preventive Screening-Counseling & Management  Alcohol-Tobacco     Alcohol drinks/day: 0     Smoking Status: quit     Year Quit: 1969  Caffeine-Diet-Exercise     Does Patient Exercise: yes     Type of exercise: walking  Medications Prior to Update: 1)  Lisinopril 10 Mg Tabs (Lisinopril) .... Take One Tablet Daily. 2)  Aspir-Low 81 Mg Tbec (Aspirin) .... Take 1 Tablet By Mouth Once A Day 3)  Bayer Contour Test  Strp (Glucose Blood) 4)  Oyster Calcium + D 500-125 Mg-Unit Tabs (Calcium-Vitamin D) .... Take Two Tablets Daily. 5)  Ferrous Sulfate 325 (65 Fe) Mg  Tabs (Ferrous Sulfate) .... Take One Tablet Two Times A Day. 6)  Lantus Solostar 100 Unit/ml Soln (Insulin Glargine) .... Inject 15 Units Once A Day At The Same Time Each Day. 7)  Pen Needles 5/16" 31g X 8 Mm Misc (Insulin Pen Needle) .... Use To Inject Insulin Once Daily- Do Not Leave Needle On Pen, Do Not Reuse.  Allergies (verified): No Known Drug Allergies  Review of Systems       per hpi  Physical Exam  General:  alert and well-developed.     Impression & Recommendations:  Problem # 1:  DIABETES MELLITUS, TYPE II (ICD-250.00) cbg's higher since started on lantus and taken off metformin.   looking at her meter she was  with cbg's 70-200 before the switch, then went to 180- 250's on 9 units of lantus, we increased dose to 15 units and cbg's are now even higher in the 300-400-600 range. no other changes in diet and life style etc... demonstrated appropirate use of the pen injection to abdomen. Will increase lantus to 20 units daily.  Will ask her to check cbg qid for one week and rtc with meter. I am not sure why cbg's continue to increase,  she was moderatley controled on oral regimen.  May need to consider meal time coverage if cbg's do not improve significantly in this week.  Her updated medication list for this problem includes:    Lisinopril 10 Mg Tabs (Lisinopril) .Marland Kitchen... Take one tablet daily.    Aspir-low 81 Mg Tbec (Aspirin) .Marland Kitchen... Take 1 tablet by mouth once a day    Lantus Solostar 100 Unit/ml Soln (Insulin glargine) ..... Inject 20 units once a day at the same time each day.  Orders: Capillary Blood Glucose/CBG ([address]) T-Lipid Profile ([id number])  Problem # 2:  RENAL INSUFFICIENCY, CHRONIC (ICD-585.9) Will recheck today to see if there is any improvement off of metformin.  Complete Medication List: 1)  Lisinopril 10 Mg Tabs (Lisinopril) .... Take one tablet daily. 2)  Aspir-low 81 Mg Tbec (Aspirin) .... Take 1 tablet by mouth once a day 3)  Bayer Contour Test Strp  (Glucose blood) 4)  Oyster Calcium + D 500-125 Mg-unit Tabs (Calcium-vitamin d) .... Take two tablets daily. 5)  Ferrous Sulfate 325 (65 Fe) Mg Tabs (Ferrous sulfate) .... Take one tablet two times a day. 6)  Lantus Solostar 100 Unit/ml Soln (Insulin glargine) .... Inject 20 units once a day at the same time each day. 7)  Pen Needles 5/16" 31g X 8 Mm Misc (Insulin pen needle) .... Use to inject insulin once daily- do not leave needle on pen, do not reuse.  Other Orders: T-Comprehensive Metabolic Panel [id number])  Patient Instructions: 1)  You had blood work done today, we will call you if there is anything that needs to be addressed. 2)  Please increase Lantus to 20 units daily. 3)  Please check your blood sugars 4 times a day, before meals and bedtime for one week. 4)  Please schedule a follow-up appointment in 1 week, bring your meter. Process Orders Check Orders Results:     [facility]: ABN not required for this insurance Tests Sent for requisitioning (May 17, 2009 11:04 AM):     05/17/2009: [facility] -- T-Lipid Profile [phone] (signed)     05/17/2009: [facility] -- T-Comprehensive Metabolic Panel [id number] (signed)    Prevention & Chronic Care Immunizations   Influenza vaccine: Not documented    Tetanus booster: Not documented    Pneumococcal vaccine: Not documented    H. zoster vaccine: Not documented  Colorectal Screening   Hemoccult: Not documented    Colonoscopy: Not documented   Colonoscopy action/deferral: GI referral  (10/25/2008)  Other Screening   Pap smear: NEGATIVE FOR INTRAEPITHELIAL LESIONS OR MALIGNANCY. BENIGN REACTIVE/REPARATIVE CHANGES  (10/24/2007)   Pap smear due: 10/2007    Mammogram: ASSESSMENT: Negative - BI-RADS 1^MM DIGITAL SCREENING  (10/08/2008)   Mammogram due: 09/2007    DXA bone density scan: Not documented   DXA bone density action/deferral: Ordered   (10/25/2008)   Smoking status: quit  (05/17/2009)  Diabetes Mellitus   HgbA1C: 8.6  (04/11/2009)   HgbA1C action/deferral: Ordered  (10/25/2008)    Eye exam: No diabetic retinopathy.     (03/19/2009)   Diabetic eye exam action/deferral: Ophthalmology referral  (10/25/2008)   Eye exam due: 03/2010    Foot exam: yes  (04/29/2009)   Foot exam action/deferral: Not indicated   High risk foot: Not documented   Foot care education: Not documented   Foot exam due: 10/25/2009    Urine microalbumin/creatinine ratio: 136.5  (04/11/2009)  Lipids   Total Cholesterol: 127  (09/07/2008)   Lipid panel action/deferral: Not indicated  LDL: 74  (09/07/2008)   LDL Direct: Not documented   HDL: 30  (09/07/2008)   Triglycerides: 117  (09/07/2008)  Self-Management Support :    Patient will work on the following items until the next clinic visit to reach self-care goals:     Medications and monitoring: take my medicines every day, check my blood sugar, check my blood pressure, bring all of my medications to every visit, weigh myself weekly, examine my feet every day  (05/17/2009)     Eating: drink diet soda or water instead of juice or soda, eat more vegetables, use fresh or frozen vegetables, eat foods that are low in salt, eat baked foods instead of fried foods, eat fruit for snacks and desserts, limit or avoid alcohol  (05/17/2009)     Activity: take a 30 minute walk every day, take the stairs instead of the elevator, park at the far end of the parking lot  (05/17/2009)    Diabetes self-management support: Not documented   Last diabetes self-management training by diabetes educator: 05/27/2009   Last medical nutrition therapy: 06/08/2008

## 2010-05-27 NOTE — Progress Notes (Signed)
Summary: high blood sugars/dmr  Phone Note Call from Patient Call back at Home Phone [phone]   Caller: Patient Summary of Call: Patient called to report that blood sugar is still high on 20 units: CBGs in am and ?after breakfast vs before lunch: saturday 369/ 386 Sunday 342/348-   548 lst night before bed Today 322/367, feels the same, urine not darker yellow, drinking water, no diet or physical activity chage, no othe rnew medications, not feeling ill or symptoms of hypoglycemia. Suspect profound insulin resistance- fasting blood sugar trend is downward.  Consider having patient increase 4 units every 3 days if fasting glucose is > 180 mg/dl, increase 2 units between 130-180 until fastings 90-130 range. she is welcome to call me every three days with the increase.  requests Friday prescription [facility] [address]. Initial call taken by: Jamison Neighbor RD,CDE,  May 20, 2009 9:31 AM  Follow-up for Phone Call        That sounds like a good plan.  I have spoken with her about it and she is nervous about self titration.  She will take 24 units tomorrow morning and for Tuesday, Wednesday and Thursday.  She will call you on Thursday or Friday to discuss.  Thank you.    New/Updated Medications: LANTUS SOLOSTAR 100 UNIT/ML SOLN (INSULIN GLARGINE) Inject 20 units once a day. Increase 4 units every 3 days if fasting glucose is > 180 mg/dl, increase 2 units between 130 and 180 until fasting glucose is between 90- 130

## 2010-05-27 NOTE — Progress Notes (Signed)
Summary: needs pen needles [facility]r  Phone Note Call from Patient Call back at Home Phone [phone]   Caller: Patient Summary of Call: returned patient call about: how many days does she have to take insulin before blood sugar levels out?  pharmacy told her insurance didn't cover insulin 160.00, she calle [facility]insuranfe and was told they did, went back to pharmacy- she had to pay 185.00 for the pens as she has not met her deductable yet. [facility]: this am it was 238 after only 1 dose Lantus,  told her to call back Friday with CBGs to see if dose needs to be increased  [facility] pen needles [facility] [address] Initial call taken by: Jamison Neighbor RD,CDE,  May 01, 2009 12:56 PM  Follow-up for Phone Call        [facility]. Follow-up by: Chinita Pester RN,  May 02, 2009 8:46 AM

## 2010-05-27 NOTE — Progress Notes (Signed)
Summary: Refill/gh  Phone Note Refill Request Message from:  Fax from Pharmacy on May 01, 2009 10:11 AM  Refills Requested: Medication #1:  PEN NEEDLES 5/16" 31G X 8 MM MISC use to inject insulin once daily- do not leave needle on pen  Method Requested: Electronic Initial call taken by: Angelina Ok RN,  May 01, 2009 10:12 AM  Follow-up for Phone Call        prescription signed.  please call in.  thank you.    Prescriptions: PEN NEEDLES 5/16" 31G X 8 MM MISC (INSULIN PEN NEEDLE) use to inject insulin once daily- do not leave needle on pen, do not reuse.  #100 x 3   Entered and Authorized by:   Elby Showers MD   Signed by:   Elby Showers MD on 05/01/2009   Method used:   Telephoned to ...       [facility]       [address]       [place], [place]  [address]       Ph: [phone]       Fax: [phone]   RxID:   [id number]   Appended Document: Refill/gh Above Rx [facility].

## 2010-05-27 NOTE — Assessment & Plan Note (Signed)
Summary: dm trainin   Allergies: No Known Drug Allergies   Complete Medication List: 1)  Lisinopril 10 Mg Tabs (Lisinopril) .... Take one tablet daily. 2)  Aspir-low 81 Mg Tbec (Aspirin) .... Take 1 tablet by mouth once a day 3)  Bayer Contour Test Strp (Glucose blood) 4)  Oyster Calcium + D 500-125 Mg-unit Tabs (Calcium-vitamin d) .... Take two tablets daily. 5)  Ferrous Sulfate 325 (65 Fe) Mg Tabs (Ferrous sulfate) .... Take one tablet two times a day. 6)  Lantus Solostar 100 Unit/ml Soln (Insulin glargine) .... Inject 9 units once a day at the same time each day.  Other Orders: DSMT(Medicare) Individual, 30 Minutes (G0108)   Potential Barriers  Visual Impairment  Transportation  Economic/Supplies  Demonstrates competency  Diabetes Medications:  Lipid lowering Meds? No Anti-platelet Meds? Yes Herbs or Supplements: Yes Comments: taughter patient about storage, action, duration an dinjection techniques with lantus solostar. patient sel finjected 9 units of saline with a syinge and was also abel to deminstrate proper technique of injection with lantus solostar insulin pen. sample pen kit provided.   Long Acting  Insulin Type:Lantus Breakfast Dose: 9 units    Monitoring Self monitoring blood glucose 2 times a day Name of Meter  Ascencia Contour 2 Measures urine ketones? No  Recent Episodes of: Requiring Help from another person  Hypoglycemia: No Severe Hypoglycemia : No   Wears Medical I.D. No Carrys Food for Low Blood sugar Yes   Would you  say you are physically active: Yes Diabetes Disease Process  Discussed today  Medications State name-action-dose-duration-side effects-and time to take medication: Demonstrates competency   State appropriate timing of food related to medication: Demonstrates competency   Demonstrates/verbalizes site selection and rotation for injections Demonstrates competency   Correctly draw up and administer  insulin-Byetta-Symlin-glucagon: Demonstrates competency   Describe safe needle/lancet disposal: Psychiatrist    Nutritional Management  Monitoring State purpose and frequency of monitoring BG-ketones-HgbA1C  : Designer, multimedia target blood glucose and HgbA1C goals: Demonstrates competency    Complications State the causes- signs and symptoms and prevention of hypoglycemia: Demonstrates competency   Explain proper treatment of hypoglycemia: Demonstrates competency    Exercise  Psychosocial Adjustment State three common feelings that might be experienced when learning to cope with diabetes: Demonstrates competency   Identify two methods to cope with these feelings: Demonstrates competency   Diabetes Management Education Done: 05/27/2009    BEHAVIORAL GOALS INITIAL Utilizing medications if for therapeutic effectiveness: inject insulin once daily        Follow-up: by phone and then in 8-12 weeks with meter Diabetes Self Management Support:spouse, clinic staff

## 2010-05-29 NOTE — Progress Notes (Signed)
Summary: needs refill/ hla  Phone Note Call from Patient   Summary of Call: pt was called due to length of time from last appt, she was somewhat rude and stated she is going to a dr stone now, i notified the pharmacy and was informed that dr stone closed her practice 05/07/2010. so pt may need to be seen in internal med again, pharmacy will inform pt. the refill was for insulin so i would hope pt will contact the clinic. Initial call taken by: Marin Roberts RN,  May 08, 2010 2:09 PM  Follow-up for Phone Call        Agree Follow-up by: Blanch Media MD,  May 08, 2010 3:59 PM

## 2010-06-27 ENCOUNTER — Encounter: Payer: Self-pay | Admitting: Internal Medicine

## 2010-07-10 DIAGNOSIS — G238 Other specified degenerative diseases of basal ganglia: Secondary | ICD-10-CM | POA: Insufficient documentation

## 2010-07-10 DIAGNOSIS — D638 Anemia in other chronic diseases classified elsewhere: Secondary | ICD-10-CM | POA: Insufficient documentation

## 2010-07-13 LAB — GLUCOSE, CAPILLARY
Glucose-Capillary: 218 mg/dL — ABNORMAL HIGH (ref 70–99)
Glucose-Capillary: 305 mg/dL — ABNORMAL HIGH (ref 70–99)

## 2010-07-28 LAB — GLUCOSE, CAPILLARY: Glucose-Capillary: 231 mg/dL — ABNORMAL HIGH (ref 70–99)

## 2010-08-07 LAB — GLUCOSE, CAPILLARY: Glucose-Capillary: 186 mg/dL — ABNORMAL HIGH (ref 70–99)

## 2010-09-04 DIAGNOSIS — E1121 Type 2 diabetes mellitus with diabetic nephropathy: Secondary | ICD-10-CM | POA: Insufficient documentation

## 2010-09-12 NOTE — Consult Note (Signed)
[place]  Patient:    Victoria Morrow, Victoria Morrow Visit Number: [id number] MRN: [id number]          Service Type: MED Location: [id number] Attending Physician:  Terald Sleeper Dictated by:   Melvyn Novas, M.D. Proc. Date: 09/14/01 Admit Date:  09/13/2001 Discharge Date: 09/16/2001   CC:         Terald Sleeper, M.D.   Consultation Report  ATTENDING PHYSICIAN: Terald Sleeper, M.D.  CHIEF COMPLAINT:  Progressive ataxia for approximately six months.  HISTORY OF PRESENT ILLNESS:  This 60 year old, African-American, right-handed female is admitted to the service of Terald Sleeper, M.D., during unassigned call night.  The patient believed that she was well until the early summer of 2002 when she developed progressive problems with gait imbalance. She also found that her voice was changing and she has become increasingly weak over the last two months.  She states that she easily becomes dizzy and that over two days prior to the admission she was unable to walk at all.  The patient denies diplopia, dysphagia, and any numbness or tingling sensation, but she has had complaints of friends and family members calling her house since she changed the message on the answering machine and friends and family were unable to understand her message.  The patient has bilateral glaucoma and cataracts.  She states that she is on no medication, except for a multivitamin and has avoiding seeing a doctor for fear of having an intractable or untreatable disease.  She was brought to the ER somewhat against her will by her husband, who states that his wife has been developing a very monotonous speech that has been difficult even for him to understand.  She has been increasingly weak and walks occasionally "like being drunk."  Her son interjects further that the patient has been drinking milk and sugary sodas in large quantities throughout the day for  the last three to four months and that she has been having to go to the bathroom a lot, especially at night.  Both husband and son also mention that the patients lower extremities seem to have muscle shrinkage.  PAST MEDICAL HISTORY:  She was diagnosed with glaucoma in 2002 by her eye doctor.  MEDICATIONS:  She takes a multivitamins.  She occasionally takes an aspirin, but only for headaches.  SOCIAL HISTORY:  The patient is married for over 28 years and lives at home with her husband.  An adult son lives close nearby.  She used to work for 25 years in one of [employer] industries until she was laid off a year ago.  The patient does not state that the loss of her employment was related to her decline in physical capacity, her husband believes that this was the beginning of his wifes decline.  He also stated that she has been unable to find a new job, although trying very hard.  The patient has a 10th grade education.  She denies smoking or alcohol use, but then interjects that she stopped smoking approximately five years ago.  She denies having ever used illegal drugs.  FAMILY HISTORY:  The patients family history is somewhat scattered.  The husband states that the patients mother had diabetes mellitus and end-stage renal disease and was on hemodialysis and died with complications of diabetes. There is a sister of his wife that became ill for unknown reasons and died within two months in progressive decline and failure to thrive.  Neither  the patient nor her husband can tell me if the sister had a malignancy, infection, or if the reason for her illness was ever found.  The patient states that "everybody" in her family has diabetes mellitus and that there is also a lot of heart disease, high blood pressure, and strokes.  ALLERGIES:  The patient has no known drug allergies.  REVIEW OF SYSTEMS:  The patient complains of progressive decline in visual acuity, inability to read,  and inability to write.  She states that she has shaking hands and is unable to even sign coarsely when she receives a mail-in order or has to sign a check.  She further states that she has had weight loss, frequent urination without loss of continence, tendency to bruise, loss of appetite, feeling depressed and fatigued, loss of energy, and having frequent constipation and nausea.  PHYSICAL EXAMINATION:  Temperature 98.7 degrees, pulse rate 100, respiratory rate 20, blood pressure 165/80.  HEENT:  Bilateral cataracts with marked cloudiness on light reflex evaluation. Pupils bilaterally relative to light.  Extraocular movements show significant nystagmus.  NECK:  Unremarkable for bruits or jugular venous distention.  HEART:  Regular rate and rhythm.  No murmur.  LUNGS:  Clear to auscultation.  Normal lung expansion.  ABDOMEN:  Soft and round with positive bowel sounds.  The patients abdomen is somewhat distended, but raises the question of possible ascites.  BREASTS:  Exam was deferred.  NEUROLOGIC:  The patient appears awake, alert, and cooperative.  She is oriented to person, place, and time, but is notably psychomotor slowed.  Her behavior is not inappropriate, but the patient is very hesitant to ask questions and appears very tired and fatigued.  Her attention span is limited. She shows significant dysarthria with complete loss of prosody.  I cannot detect an aphasia syndrome.  There is also no dyspraxia, but significant ataxia.  The patient is able to name, follow commands, and repeat.  Cranial Nerves:  The patients visual fields to double simultaneous stimuli are significantly decreased.  Her pupils are round and reactive without anisocoria, but the fundi are not evaluated due to the significant cataracts. Optokinetic responses to moving finger shows zygotic eye movements almost scanning with rapid components horizontally.  There is no vertical nystagmus. The patients  hearing is bilaterally equal.  She does not lateralize the  ______ test.  She has symmetric facial strength, but probably bilateral facial weakness and loss of facial expression.  Her corneal reflex is present. The tongue and uvula are midline.  No palatomyoclonus.  No vesiculations on the tongue.  Motor:  Decreased strength, tone, and mass in the upper and lower extremities.  Decreased fine motor movements due to significant ataxia.  Grip strength is bilaterally 4/5.  The patient does not show a pronator drift, but significant almost asterixis-like movements with both arms when extended with closed eyes.  Reflexes are brisk and symmetrically so.  The patient has a three-beat clonus.  No upgoing toes.  Sensory:  The patients sensory is intact to primary and cortical modalities, cold, vibration, stereoagnosis, and two-point discrimination.  Cerebellar:  Massive impairment of finger-to-nose test with dysdiadochokinesia.  Unable to perform any rapid alternating movements.  Unable to perform a ______ test.  Significant asterixis-like coarse tremor, dystaxia, and dysmetria.  No presence of synkinesis.  Gait and station significantly impair the patients needs to hold onto an object.  She shows gait ataxia with massive swaying of the truncal structure, as well as small steps.  The  Romberg is strictly positive.  She is unable to balance her gait once she cannot focus her eyes towards the floor.  A tandem gait is virtually impossible.  Heel and toe walk were deferred.  LABORATORY DATA:  The patient was admitted through the hospital ER the night prior to the consultation.  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  An MRI/MRA is ordered within the hour of this evaluation.  The CBC found the white blood cell count to be 10.4, hemoglobin 14.4, hematocrit 42, and platelets 400.  The metabolic panel showed potassium  3.9, sodium 134, chloride 99, and CO2 24.  The glucose at admission was 486, BUN 14, and creatinine 0.9.  The urinalysis showed 15 ketones, 300 mg of protein, greater than 500 mg of glucose, a few bacteria, and a few yeast.  DIFFERENTIAL DIAGNOSIS:  The patients preexisting white matter disease and her cerebellar atrophy by CT would fit a nonacute degenerative course of a disease.  Also, a six to eight-month course of progressive ataxia in a 60 year old female without proven family history of oligopontocerebellar degeneration appears rarely.  In the differential therefore is a paraneoplastic syndrome such as occurs with lymphoma, small-cell lung cancer, and some of the breast cancer types.  It would be helpful to evaluate some of the patients siblings and if possible obtain genetic testing as her children and her grandchildren might be effected by a possible hereditary trait and this could effect their family planning.  To evaluate for a possible paraneoplastic disease, it seems beneficial to place the patient for a mammography to rule out breast cancer, as well as for a chest CT for tumor search.  A lymphoma was ruled out by the fairly normal CBC here in the hospital.  The patient will become a patient of the family practice clinic as she currently has no primary care physician.  Neurology would like to follow up and should the patient be discharged over the holiday weekend, I would like to see her at [facility] within two to three months for a follow-up visit.  Should there be progressive decline and admission to the hospital, an empiric treatment with IgG if a paraneoplastic disease is still suspected can be beneficial.  Further, I initiated isoniazid therapy at 300 mg daily, which is a treatment recommended in some of the literature.  Vitamin B6 is in addition needed to prevent isoniazid-induced polyneuropathy.  The patient will further follow up for diabetic  treatment and PT and will require a speech therapy evaluation. Dictated by:   Melvyn Novas, M.D. Attending Physician:  Terald Sleeper DD:  09/20/01 TD:  09/21/01 Job: [id number]

## 2010-09-12 NOTE — Discharge Summary (Signed)
[place]ia Morrow. [facility]  Patient:    Victoria Morrow, Victoria Morrow Visit Number: [id number] MRN: [id number]          Service Type: MED Location: 3000 [id number] Attending Physician:  Victoria Morrow Dictated by:   Victoria Morrow, N.P. Admit Date:  09/13/2001 Discharge Date: 09/16/2001   CC:         Victoria Morrow [facility]  Victoria Morrow, M.D.   Discharge Summary  DATE OF BIRTH:  May 05, 1950  CHIEF COMPLAINT:  Progressive gait ataxia for approximately six months.  HISTORY OF PRESENT ILLNESS:  This 60 year old female was admitted to the service of Victoria Morrow as unassigned call.  The patient was last well in the Summer of 2002.  Since that time, the patient has developed a progressive gait decline.  Recently, she has only been able to walk holding onto furniture in the home.  Over 48 hours prior to this admission, she was unable to walk at all.  She denies any diplopia, dysphagia and changes in sensation.  There are some complaints of dysarthric speech and decline in her visual acuity.  She is on no medications except for a multivitamin.  No alcohol or drug use.  PAST MEDICAL HISTORY:  Glaucoma diagnosed last year.  MEDICATION:  Multivitamin.  SOCIAL HISTORY:  The patient lives at home with her husband.  She was working up to approximately one year ago when her symptomatology began.  REVIEW OF SYSTEMS:  HEENT: The patient complains of decline in her visual acuity.  GU: No bladder pain.  GI: No bowel problems reported.  Other history as above.  FAMILY HISTORY:  No family history for neurologic disorders or diabetes.  PHYSICAL EXAMINATION:  VITAL SIGNS:  Temperature 99.4, pulse 113, respiratory rate 24, blood pressure 180/87.  HEENT:  Noted cataracts, otherwise unremarkable.  NECK:  Unremarkable for bruits or jugular venous distention.  HEART:  Regular rate and rhythm regular without murmur, S3, S4.  LUNGS:  Breath sounds  clear and equal without distress.  ABDOMEN:  Soft and round with positive bowel sounds.  No pain or masses with palpation.  BREASTS:  No masses palpable.  NEUROLOGIC:  Noted right gaze nystagmus.  Decreased left-sided gag reflex. Appears to have normal strength and tone.  Appears to have normal sensation. Positive Hoffmanns reflex bilaterally.  Reflexes appeared pathologically brisk 3+ at all sites.  Toes downgoing bilaterally.  Speech was dysarthric.  ASSESSMENT/PLAN:  The patient was admitted for further evaluation with rapidly worsening gait ataxia and dysarthric speech.  We suspected the possibility of brain tumor.  The patient was also noted to have significantly elevated blood glucose at 486 which is a new finding with no history of previous diabetes. Also noted dense, bilateral cataracts on admission exam.  LABORATORY DATA AND X-RAY FINDINGS:  CBC on Sep 13, 2001, found WBC 10.4, hemoglobin 14.4, hematocrit 42.0, platelets 400.  Metabolic panel with sodium slightly low at 134, potassium 3.9, chloride 99, CO2 24, glucose high at 486, BUN 14, creatinine 0.9.  Calcium slightly elevated at 10.9.  Alk phos high at 157 with other liver indices unremarkable.  Urinalysis with possible UTI with 15 ketones, 300 protein, greater than 1000 glucose, many epithelials, 7-10 wbcs, few bacteria and few yeast.  Urine culture was obtained.  A chest x-ray found no evidence of acute disease.  The CT scan of the head was done to evaluate for possible brain tumor.  She noted cerebellar atrophy with white  matter disease in the inferior and cerebellar hemispheres bilaterally. This does not appear to reflect an acute abnormality.  A cerebellar degenerative disorder is possible with MRI suggested for confirmation.  There was no acute intracranial abnormalities noted.  A MRI/MRA of the brain was done as followup on Sep 14, 2001, noting cerebellar atrophy with atrophy of the pons and atrophy of the  medulla/olivary region, abnormal signal within the middle cerebral peduncle extends into the inferior cerebral peduncles and involves the inferior/anterior aspect of the lower medulla.  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  Lateral and left orbital wall is noted a fatty heterogeneous lesion measuring 2 x 2.4 x 1.8 cm, not a simple lipoma.  This possibly represents a dermoid.  Exact etiology unclear.  The MRA portion suggested irregularity in the intracranial vasculature (see reports for details).  HOSPITAL COURSE:  A neurology consult was obtained with Victoria Morrow.  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  It was felt reasonable to proceed with a perineoplastic evaluation, most likely etiologies among cancer, breast cancer or lymphoma.  Recommended genetic screen to allow family planning, particularly for her children.  The patient was placed on sliding scale insulin as well as 10 units of Lantus at bedtime.  Oral agent, Glyburide, at 5 mg once daily a.m. was also added.  Blood sugars are better controlled prior to discharge, but will definitely require follow up postdischarge with likely increase needed in patients oral agent.  Per neurology, isoniazid therapy was initiated at 300 mg daily.  Vitamin B6 also provided.  She was seen in consultation by speech therapy considering marked dysarthria, question dysphagia.  Their evaluation noted no significant problems and recommended regular diet with thin liquids.  Per diabetes coordinator, dietary education was initiated.  A home glucometer will be provided.  She was seen by physical and occupational therapy.  Recommendations are for a rolling walker, three-in-one seat and continued home health physical and occupational therapy. I also will request home health nursing to follow the patients diabetes initially.  An appointment was arranged with the  outpatient clinic at the family medicine  practice at [facility] at 2:15 p.m. A chest CT scan was done to screen for any possible malignancy and on Sep 15, 2001, was negative.  It did note some atrophy in the right kidney.  She will still need to have a mammogram screening to rule out any malignancies there to rule out as an outpatient there.  I will ask case management to schedule this prior to discharge.  Followup blood work appears stable at discharge.  Please see discharge labs below.  Blood sugars are reasonably controlled and will discontinue sliding scale and Lantus insulin at discharge and ask home health nursing to follow blood sugars until the patient can be seen at follow-up appointment.  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  She will, however, require significant followup postdischarge with her functional ability likely to continue to decline.  DISCHARGE LABORATORY DATA AND X-RAY FINDINGS:  CBC done Sep 16, 2001, found WBC 11.1, hemoglobin 12.9, hematocrit 36.7, platelets 366.  Metabolic panel with sodium 140, potassium 3.3, chloride 110, CO2 24, BUN 6, creatinine 0.7. Urine culture produced only 15,000 colony count of multiple species. Radiology as reported above.  DISCHARGE MEDICATIONS: 1. Glyburide 5 mg once daily. 2. Isoniazid 300 mg once daily. 3. Multivitamin daily. 4. Vitamin B6 25 mg daily.  CONSULTATION:  Victoria Morrow, neurology.  PROCEDURES: 1. Computed tomography scan of the chest on Sep 15, 2001, with results as    above. 2. Computed tomography scan of the brain and magnetic resonance imaging and    magnetic resonance angiography of the brain on Sep 14, 2001, with results    as above.  DIET:  1800 calorie ADA diet.  SPECIAL INSTRUCTIONS:  Home health R.N. to follow postdischarge, esure blood sugar monitoring and reasonable control on oral agent only, pending followup at [facility] on September 30, 2001.  Home health PT/OT to follow postdischarge to ensure gait  stability with rolling walker and home safety.  FOLLOWUP:  Follow-up appointment arranged with [facility] on September 30, 2001, at 2:15 p.m.  Will need to follow up in regards to blood sugar control, physical and occupational therapy progress.  Will need to coordinate any follow-up appointments needed with neurology services.  Will attempt to arrange a mammogram postdischarge.  Results of this will need to be followed up at the next office appointment with family practice.  CONDITION ON DISCHARGE:  Stable, although expect further decline in the patients overall ADL ability.  DISPOSITION:  Returning home with home health services.  Extensive family support is available.  Discharge process greater than 30 minutes from 8 a.m. through 8:45 a.m. Dictated by:   Victoria Morrow, N.P. Attending Physician:  Victoria Morrow DD:  09/16/01 TD:  09/19/01 Job: [id number] [id number]

## 2011-05-21 ENCOUNTER — Other Ambulatory Visit (HOSPITAL_COMMUNITY): Payer: Self-pay | Admitting: Family Medicine

## 2011-05-21 DIAGNOSIS — Z780 Asymptomatic menopausal state: Secondary | ICD-10-CM

## 2011-05-21 DIAGNOSIS — Z1231 Encounter for screening mammogram for malignant neoplasm of breast: Secondary | ICD-10-CM

## 2011-06-19 ENCOUNTER — Ambulatory Visit (HOSPITAL_COMMUNITY)
Admission: RE | Admit: 2011-06-19 | Discharge: 2011-06-19 | Disposition: A | Discharge status: Home / Self Care | Payer: Medicare Other | Source: Ambulatory Visit | Attending: Family Medicine | Admitting: Family Medicine

## 2011-06-19 DIAGNOSIS — Z780 Asymptomatic menopausal state: Secondary | ICD-10-CM

## 2011-06-19 DIAGNOSIS — Z1231 Encounter for screening mammogram for malignant neoplasm of breast: Secondary | ICD-10-CM | POA: Insufficient documentation

## 2011-06-19 DIAGNOSIS — Z13820 Encounter for screening for osteoporosis: Secondary | ICD-10-CM | POA: Insufficient documentation

## 2012-05-24 ENCOUNTER — Other Ambulatory Visit (HOSPITAL_COMMUNITY): Payer: Self-pay | Admitting: Family Medicine

## 2012-05-24 DIAGNOSIS — Z1231 Encounter for screening mammogram for malignant neoplasm of breast: Secondary | ICD-10-CM

## 2012-06-20 ENCOUNTER — Ambulatory Visit (HOSPITAL_COMMUNITY)
Admission: RE | Admit: 2012-06-20 | Discharge: 2012-06-20 | Disposition: A | Discharge status: Home / Self Care | Payer: Medicare Other | Source: Ambulatory Visit | Attending: Family Medicine | Admitting: Family Medicine

## 2012-06-20 DIAGNOSIS — Z1231 Encounter for screening mammogram for malignant neoplasm of breast: Secondary | ICD-10-CM | POA: Insufficient documentation

## 2013-05-16 ENCOUNTER — Other Ambulatory Visit (HOSPITAL_COMMUNITY): Payer: Self-pay | Admitting: Family Medicine

## 2013-05-16 DIAGNOSIS — Z1231 Encounter for screening mammogram for malignant neoplasm of breast: Secondary | ICD-10-CM

## 2013-05-17 ENCOUNTER — Other Ambulatory Visit (HOSPITAL_COMMUNITY): Payer: Self-pay | Admitting: Family Medicine

## 2013-05-17 DIAGNOSIS — M8580 Other specified disorders of bone density and structure, unspecified site: Secondary | ICD-10-CM

## 2013-06-21 ENCOUNTER — Ambulatory Visit (HOSPITAL_COMMUNITY)
Admission: RE | Admit: 2013-06-21 | Discharge: 2013-06-21 | Disposition: A | Discharge status: Home / Self Care | Payer: Medicare HMO | Source: Ambulatory Visit | Attending: Family Medicine | Admitting: Family Medicine

## 2013-06-21 DIAGNOSIS — M8580 Other specified disorders of bone density and structure, unspecified site: Secondary | ICD-10-CM

## 2013-06-21 DIAGNOSIS — Z1231 Encounter for screening mammogram for malignant neoplasm of breast: Secondary | ICD-10-CM

## 2013-09-27 DIAGNOSIS — H35039 Hypertensive retinopathy, unspecified eye: Secondary | ICD-10-CM | POA: Insufficient documentation

## 2014-05-04 ENCOUNTER — Other Ambulatory Visit: Payer: Self-pay | Admitting: Family Medicine

## 2014-05-04 DIAGNOSIS — R7989 Other specified abnormal findings of blood chemistry: Secondary | ICD-10-CM

## 2014-05-10 ENCOUNTER — Ambulatory Visit
Admission: RE | Admit: 2014-05-10 | Discharge: 2014-05-10 | Disposition: A | Discharge status: Home / Self Care | Payer: Medicare HMO | Source: Ambulatory Visit | Attending: Family Medicine | Admitting: Family Medicine

## 2014-05-10 DIAGNOSIS — R7989 Other specified abnormal findings of blood chemistry: Secondary | ICD-10-CM

## 2015-04-28 ENCOUNTER — Encounter (HOSPITAL_COMMUNITY): Payer: Self-pay | Admitting: Emergency Medicine

## 2015-04-28 ENCOUNTER — Inpatient Hospital Stay (HOSPITAL_COMMUNITY)
Admission: EM | Admit: 2015-04-28 | Discharge: 2015-05-24 | DRG: 438 | Disposition: A | Discharge status: Other Acute Inpatient Hospital | Payer: Commercial Managed Care - HMO | Attending: Internal Medicine | Admitting: Internal Medicine

## 2015-04-28 ENCOUNTER — Inpatient Hospital Stay (HOSPITAL_COMMUNITY): Payer: Commercial Managed Care - HMO

## 2015-04-28 ENCOUNTER — Emergency Department (HOSPITAL_COMMUNITY): Payer: Commercial Managed Care - HMO

## 2015-04-28 DIAGNOSIS — Z789 Other specified health status: Secondary | ICD-10-CM

## 2015-04-28 DIAGNOSIS — Z7984 Long term (current) use of oral hypoglycemic drugs: Secondary | ICD-10-CM | POA: Diagnosis not present

## 2015-04-28 DIAGNOSIS — R509 Fever, unspecified: Secondary | ICD-10-CM

## 2015-04-28 DIAGNOSIS — Z6824 Body mass index (BMI) 24.0-24.9, adult: Secondary | ICD-10-CM | POA: Diagnosis not present

## 2015-04-28 DIAGNOSIS — Z4659 Encounter for fitting and adjustment of other gastrointestinal appliance and device: Secondary | ICD-10-CM

## 2015-04-28 DIAGNOSIS — J690 Pneumonitis due to inhalation of food and vomit: Secondary | ICD-10-CM | POA: Diagnosis not present

## 2015-04-28 DIAGNOSIS — K851 Biliary acute pancreatitis without necrosis or infection: Secondary | ICD-10-CM | POA: Diagnosis not present

## 2015-04-28 DIAGNOSIS — R6521 Severe sepsis with septic shock: Secondary | ICD-10-CM | POA: Diagnosis present

## 2015-04-28 DIAGNOSIS — I4891 Unspecified atrial fibrillation: Secondary | ICD-10-CM | POA: Diagnosis not present

## 2015-04-28 DIAGNOSIS — Z8249 Family history of ischemic heart disease and other diseases of the circulatory system: Secondary | ICD-10-CM

## 2015-04-28 DIAGNOSIS — E872 Acidosis: Secondary | ICD-10-CM | POA: Diagnosis present

## 2015-04-28 DIAGNOSIS — N359 Urethral stricture, unspecified: Secondary | ICD-10-CM | POA: Diagnosis present

## 2015-04-28 DIAGNOSIS — R609 Edema, unspecified: Secondary | ICD-10-CM | POA: Diagnosis not present

## 2015-04-28 DIAGNOSIS — E870 Hyperosmolality and hypernatremia: Secondary | ICD-10-CM | POA: Diagnosis present

## 2015-04-28 DIAGNOSIS — K8591 Acute pancreatitis with uninfected necrosis, unspecified: Principal | ICD-10-CM

## 2015-04-28 DIAGNOSIS — L0291 Cutaneous abscess, unspecified: Secondary | ICD-10-CM

## 2015-04-28 DIAGNOSIS — R6510 Systemic inflammatory response syndrome (SIRS) of non-infectious origin without acute organ dysfunction: Secondary | ICD-10-CM | POA: Diagnosis not present

## 2015-04-28 DIAGNOSIS — N189 Chronic kidney disease, unspecified: Secondary | ICD-10-CM | POA: Diagnosis not present

## 2015-04-28 DIAGNOSIS — Z841 Family history of disorders of kidney and ureter: Secondary | ICD-10-CM

## 2015-04-28 DIAGNOSIS — R112 Nausea with vomiting, unspecified: Secondary | ICD-10-CM | POA: Diagnosis present

## 2015-04-28 DIAGNOSIS — K8020 Calculus of gallbladder without cholecystitis without obstruction: Secondary | ICD-10-CM | POA: Diagnosis not present

## 2015-04-28 DIAGNOSIS — N179 Acute kidney failure, unspecified: Secondary | ICD-10-CM | POA: Diagnosis present

## 2015-04-28 DIAGNOSIS — B3749 Other urogenital candidiasis: Secondary | ICD-10-CM | POA: Diagnosis present

## 2015-04-28 DIAGNOSIS — R740 Nonspecific elevation of levels of transaminase and lactic acid dehydrogenase [LDH]: Secondary | ICD-10-CM | POA: Diagnosis not present

## 2015-04-28 DIAGNOSIS — E861 Hypovolemia: Secondary | ICD-10-CM | POA: Diagnosis present

## 2015-04-28 DIAGNOSIS — R0603 Acute respiratory distress: Secondary | ICD-10-CM

## 2015-04-28 DIAGNOSIS — D638 Anemia in other chronic diseases classified elsewhere: Secondary | ICD-10-CM | POA: Diagnosis present

## 2015-04-28 DIAGNOSIS — J189 Pneumonia, unspecified organism: Secondary | ICD-10-CM

## 2015-04-28 DIAGNOSIS — N183 Chronic kidney disease, stage 3 unspecified: Secondary | ICD-10-CM | POA: Diagnosis present

## 2015-04-28 DIAGNOSIS — K5710 Diverticulosis of small intestine without perforation or abscess without bleeding: Secondary | ICD-10-CM | POA: Diagnosis present

## 2015-04-28 DIAGNOSIS — R109 Unspecified abdominal pain: Secondary | ICD-10-CM

## 2015-04-28 DIAGNOSIS — Z7982 Long term (current) use of aspirin: Secondary | ICD-10-CM

## 2015-04-28 DIAGNOSIS — N3941 Urge incontinence: Secondary | ICD-10-CM | POA: Diagnosis present

## 2015-04-28 DIAGNOSIS — K429 Umbilical hernia without obstruction or gangrene: Secondary | ICD-10-CM

## 2015-04-28 DIAGNOSIS — E876 Hypokalemia: Secondary | ICD-10-CM | POA: Diagnosis present

## 2015-04-28 DIAGNOSIS — N8110 Cystocele, unspecified: Secondary | ICD-10-CM | POA: Diagnosis present

## 2015-04-28 DIAGNOSIS — R0600 Dyspnea, unspecified: Secondary | ICD-10-CM | POA: Diagnosis not present

## 2015-04-28 DIAGNOSIS — K8689 Other specified diseases of pancreas: Secondary | ICD-10-CM

## 2015-04-28 DIAGNOSIS — R7401 Elevation of levels of liver transaminase levels: Secondary | ICD-10-CM

## 2015-04-28 DIAGNOSIS — J9620 Acute and chronic respiratory failure, unspecified whether with hypoxia or hypercapnia: Secondary | ICD-10-CM

## 2015-04-28 DIAGNOSIS — E1165 Type 2 diabetes mellitus with hyperglycemia: Secondary | ICD-10-CM | POA: Diagnosis present

## 2015-04-28 DIAGNOSIS — G238 Other specified degenerative diseases of basal ganglia: Secondary | ICD-10-CM | POA: Diagnosis present

## 2015-04-28 DIAGNOSIS — J9621 Acute and chronic respiratory failure with hypoxia: Secondary | ICD-10-CM | POA: Diagnosis not present

## 2015-04-28 DIAGNOSIS — E440 Moderate protein-calorie malnutrition: Secondary | ICD-10-CM | POA: Diagnosis present

## 2015-04-28 DIAGNOSIS — E119 Type 2 diabetes mellitus without complications: Secondary | ICD-10-CM

## 2015-04-28 DIAGNOSIS — A419 Sepsis, unspecified organism: Secondary | ICD-10-CM | POA: Diagnosis present

## 2015-04-28 DIAGNOSIS — E785 Hyperlipidemia, unspecified: Secondary | ICD-10-CM | POA: Diagnosis present

## 2015-04-28 DIAGNOSIS — E1121 Type 2 diabetes mellitus with diabetic nephropathy: Secondary | ICD-10-CM | POA: Diagnosis present

## 2015-04-28 DIAGNOSIS — K567 Ileus, unspecified: Secondary | ICD-10-CM | POA: Diagnosis not present

## 2015-04-28 DIAGNOSIS — K8592 Acute pancreatitis with infected necrosis, unspecified: Secondary | ICD-10-CM

## 2015-04-28 DIAGNOSIS — E1122 Type 2 diabetes mellitus with diabetic chronic kidney disease: Secondary | ICD-10-CM | POA: Diagnosis present

## 2015-04-28 DIAGNOSIS — Z794 Long term (current) use of insulin: Secondary | ICD-10-CM

## 2015-04-28 DIAGNOSIS — I471 Supraventricular tachycardia: Secondary | ICD-10-CM | POA: Diagnosis present

## 2015-04-28 DIAGNOSIS — J9601 Acute respiratory failure with hypoxia: Secondary | ICD-10-CM | POA: Diagnosis not present

## 2015-04-28 DIAGNOSIS — K859 Acute pancreatitis without necrosis or infection, unspecified: Secondary | ICD-10-CM

## 2015-04-28 DIAGNOSIS — R0682 Tachypnea, not elsewhere classified: Secondary | ICD-10-CM

## 2015-04-28 DIAGNOSIS — Z452 Encounter for adjustment and management of vascular access device: Secondary | ICD-10-CM

## 2015-04-28 DIAGNOSIS — J9600 Acute respiratory failure, unspecified whether with hypoxia or hypercapnia: Secondary | ICD-10-CM

## 2015-04-28 LAB — CBC
HEMATOCRIT: 40.8 % (ref 36.0–46.0)
Hemoglobin: 13.9 g/dL (ref 12.0–15.0)
MCH: 29.6 pg (ref 26.0–34.0)
MCHC: 34.1 g/dL (ref 30.0–36.0)
MCV: 87 fL (ref 78.0–100.0)
Platelets: 460 10*3/uL — ABNORMAL HIGH (ref 150–400)
RBC: 4.69 MIL/uL (ref 3.87–5.11)
RDW: 13.6 % (ref 11.5–15.5)
WBC: 33.2 10*3/uL — ABNORMAL HIGH (ref 4.0–10.5)

## 2015-04-28 LAB — COMPREHENSIVE METABOLIC PANEL
ALBUMIN: 4.2 g/dL (ref 3.5–5.0)
ALT: 67 U/L — ABNORMAL HIGH (ref 14–54)
AST: 124 U/L — AB (ref 15–41)
Alkaline Phosphatase: 78 U/L (ref 38–126)
Anion gap: 14 (ref 5–15)
BILIRUBIN TOTAL: 1 mg/dL (ref 0.3–1.2)
BUN: 31 mg/dL — AB (ref 6–20)
CHLORIDE: 109 mmol/L (ref 101–111)
CO2: 17 mmol/L — AB (ref 22–32)
Calcium: 10.6 mg/dL — ABNORMAL HIGH (ref 8.9–10.3)
Creatinine, Ser: 2.08 mg/dL — ABNORMAL HIGH (ref 0.44–1.00)
GFR calc Af Amer: 27 mL/min — ABNORMAL LOW (ref >60)
GFR calc non Af Amer: 24 mL/min — ABNORMAL LOW (ref >60)
GLUCOSE: 356 mg/dL — AB (ref 65–99)
POTASSIUM: 4.2 mmol/L (ref 3.5–5.1)
SODIUM: 140 mmol/L (ref 135–145)
Total Protein: 7.3 g/dL (ref 6.5–8.1)

## 2015-04-28 LAB — BASIC METABOLIC PANEL
Anion gap: 11 (ref 5–15)
BUN: 37 mg/dL — AB (ref 6–20)
CALCIUM: 9.2 mg/dL (ref 8.9–10.3)
CHLORIDE: 117 mmol/L — AB (ref 101–111)
CO2: 13 mmol/L — AB (ref 22–32)
CREATININE: 2.13 mg/dL — AB (ref 0.44–1.00)
GFR calc Af Amer: 27 mL/min — ABNORMAL LOW (ref >60)
GFR calc non Af Amer: 23 mL/min — ABNORMAL LOW (ref >60)
GLUCOSE: 362 mg/dL — AB (ref 65–99)
Potassium: 5.6 mmol/L — ABNORMAL HIGH (ref 3.5–5.1)
Sodium: 141 mmol/L (ref 135–145)

## 2015-04-28 LAB — LACTIC ACID, PLASMA
LACTIC ACID, VENOUS: 2 mmol/L (ref 0.5–2.0)
Lactic Acid, Venous: 1.9 mmol/L (ref 0.5–2.0)

## 2015-04-28 LAB — PROCALCITONIN: PROCALCITONIN: 11.43 ng/mL

## 2015-04-28 LAB — GLUCOSE, CAPILLARY
Glucose-Capillary: 297 mg/dL — ABNORMAL HIGH (ref 65–99)
Glucose-Capillary: 294 mg/dL — ABNORMAL HIGH (ref 65–99)

## 2015-04-28 LAB — I-STAT CG4 LACTIC ACID, ED
Lactic Acid, Venous: 4.2 mmol/L (ref 0.5–2.0)
Lactic Acid, Venous: 1.74 mmol/L (ref 0.5–2.0)

## 2015-04-28 LAB — MRSA PCR SCREENING: MRSA by PCR: NEGATIVE

## 2015-04-28 LAB — LIPASE, BLOOD: Lipase: >3000 U/L — ABNORMAL HIGH (ref 11–51)

## 2015-04-28 LAB — TRIGLYCERIDES: TRIGLYCERIDES: 104 mg/dL (ref <150)

## 2015-04-28 LAB — PROTIME-INR
INR: 1.04 (ref 0.00–1.49)
Prothrombin Time: 13.8 seconds (ref 11.6–15.2)

## 2015-04-28 LAB — APTT: APTT: 27 s (ref 24–37)

## 2015-04-28 MED ORDER — CHLORHEXIDINE GLUCONATE 0.12 % MT SOLN
15.0000 mL | Freq: Two times a day (BID) | OROMUCOSAL | Status: DC
  Administered 2015-04-28 – 2015-05-01 (×6): 15 mL via OROMUCOSAL
  Filled 2015-04-28 (×6): qty 15

## 2015-04-28 MED ORDER — ACETAMINOPHEN 650 MG RE SUPP
650.0000 mg | Freq: Four times a day (QID) | RECTAL | Status: DC | PRN
  Administered 2015-05-11 – 2015-05-22 (×6): 650 mg via RECTAL
  Filled 2015-04-28 (×9): qty 1

## 2015-04-28 MED ORDER — IMIPENEM-CILASTATIN 250 MG IV SOLR
250.0000 mg | Freq: Once | INTRAVENOUS | Status: AC
  Administered 2015-04-28: 250 mg via INTRAVENOUS
  Filled 2015-04-28: qty 250

## 2015-04-28 MED ORDER — HYDROMORPHONE HCL 1 MG/ML IJ SOLN
1.0000 mg | INTRAMUSCULAR | Status: DC | PRN
  Administered 2015-04-29: 0.5 mg via INTRAVENOUS
  Filled 2015-04-28: qty 1

## 2015-04-28 MED ORDER — ONDANSETRON HCL 4 MG PO TABS: 4.0000 mg | ORAL_TABLET | Freq: Four times a day (QID) | ORAL | Status: DC | PRN

## 2015-04-28 MED ORDER — SODIUM BICARBONATE 8.4 % IV SOLN
INTRAVENOUS | Status: DC
  Administered 2015-04-29: 01:00:00 via INTRAVENOUS
  Filled 2015-04-28: qty 50

## 2015-04-28 MED ORDER — SODIUM CHLORIDE 0.9 % IV SOLN
250.0000 mg | Freq: Three times a day (TID) | INTRAVENOUS | Status: DC
  Administered 2015-04-29 – 2015-04-30 (×4): 250 mg via INTRAVENOUS
  Filled 2015-04-28 (×5): qty 250

## 2015-04-28 MED ORDER — SODIUM BICARBONATE 8.4 % IV SOLN
100.0000 meq | Freq: Once | INTRAVENOUS | Status: AC
  Administered 2015-04-29: 100 meq via INTRAVENOUS
  Filled 2015-04-28: qty 50

## 2015-04-28 MED ORDER — ONDANSETRON HCL 4 MG/2ML IJ SOLN
4.0000 mg | Freq: Four times a day (QID) | INTRAMUSCULAR | Status: DC | PRN
  Administered 2015-04-28: 4 mg via INTRAVENOUS
  Filled 2015-04-28: qty 2

## 2015-04-28 MED ORDER — SODIUM POLYSTYRENE SULFONATE 15 GM/60ML PO SUSP
30.0000 g | Freq: Once | ORAL | Status: AC
  Administered 2015-04-29: 30 g via RECTAL
  Filled 2015-04-28: qty 120

## 2015-04-28 MED ORDER — SODIUM CHLORIDE 0.9 % IV BOLUS (SEPSIS)
1000.0000 mL | Freq: Once | INTRAVENOUS | Status: AC
  Administered 2015-04-28: 1000 mL via INTRAVENOUS

## 2015-04-28 MED ORDER — INSULIN ASPART 100 UNIT/ML ~~LOC~~ SOLN
0.0000 [IU] | SUBCUTANEOUS | Status: AC
  Administered 2015-04-28 – 2015-04-29 (×2): 11 [IU] via SUBCUTANEOUS
  Administered 2015-04-29: 3 [IU] via SUBCUTANEOUS
  Administered 2015-04-29 (×2): 11 [IU] via SUBCUTANEOUS
  Administered 2015-04-29: 7 [IU] via SUBCUTANEOUS
  Administered 2015-04-30 – 2015-05-01 (×4): 4 [IU] via SUBCUTANEOUS
  Administered 2015-05-01 (×2): 3 [IU] via SUBCUTANEOUS
  Administered 2015-05-01: 4 [IU] via SUBCUTANEOUS
  Administered 2015-05-02 (×3): 3 [IU] via SUBCUTANEOUS
  Administered 2015-05-03: 4 [IU] via SUBCUTANEOUS
  Administered 2015-05-03: 7 [IU] via SUBCUTANEOUS
  Administered 2015-05-03 (×2): 3 [IU] via SUBCUTANEOUS
  Administered 2015-05-03: 4 [IU] via SUBCUTANEOUS
  Administered 2015-05-03 – 2015-05-04 (×2): 7 [IU] via SUBCUTANEOUS
  Administered 2015-05-04: 4 [IU] via SUBCUTANEOUS
  Administered 2015-05-04 (×3): 7 [IU] via SUBCUTANEOUS
  Administered 2015-05-05 (×3): 4 [IU] via SUBCUTANEOUS
  Administered 2015-05-05: 3 [IU] via SUBCUTANEOUS
  Administered 2015-05-05: 4 [IU] via SUBCUTANEOUS
  Administered 2015-05-05: 7 [IU] via SUBCUTANEOUS
  Administered 2015-05-06 (×2): 3 [IU] via SUBCUTANEOUS
  Administered 2015-05-06 (×2): 4 [IU] via SUBCUTANEOUS
  Administered 2015-05-06 – 2015-05-07 (×4): 7 [IU] via SUBCUTANEOUS
  Administered 2015-05-07: 4 [IU] via SUBCUTANEOUS
  Administered 2015-05-07 – 2015-05-08 (×3): 7 [IU] via SUBCUTANEOUS
  Administered 2015-05-08: 15 [IU] via SUBCUTANEOUS
  Administered 2015-05-08: 4 [IU] via SUBCUTANEOUS
  Administered 2015-05-08: 15 [IU] via SUBCUTANEOUS
  Administered 2015-05-08: 7 [IU] via SUBCUTANEOUS
  Administered 2015-05-08: 15 [IU] via SUBCUTANEOUS
  Administered 2015-05-09: 7 [IU] via SUBCUTANEOUS
  Administered 2015-05-09: 11 [IU] via SUBCUTANEOUS
  Administered 2015-05-09 (×3): 7 [IU] via SUBCUTANEOUS
  Administered 2015-05-09: 11 [IU] via SUBCUTANEOUS
  Administered 2015-05-10 (×5): 7 [IU] via SUBCUTANEOUS
  Administered 2015-05-10: 4 [IU] via SUBCUTANEOUS
  Administered 2015-05-11: 7 [IU] via SUBCUTANEOUS
  Administered 2015-05-11: 4 [IU] via SUBCUTANEOUS
  Administered 2015-05-11 (×4): 7 [IU] via SUBCUTANEOUS
  Administered 2015-05-11 – 2015-05-12 (×2): 4 [IU] via SUBCUTANEOUS
  Administered 2015-05-12 (×3): 7 [IU] via SUBCUTANEOUS
  Administered 2015-05-12 – 2015-05-13 (×3): 4 [IU] via SUBCUTANEOUS
  Administered 2015-05-13: 3 [IU] via SUBCUTANEOUS
  Administered 2015-05-13: 4 [IU] via SUBCUTANEOUS
  Administered 2015-05-13 – 2015-05-15 (×7): 3 [IU] via SUBCUTANEOUS
  Administered 2015-05-16: 4 [IU] via SUBCUTANEOUS
  Administered 2015-05-16: 3 [IU] via SUBCUTANEOUS
  Administered 2015-05-16: 4 [IU] via SUBCUTANEOUS
  Administered 2015-05-16 – 2015-05-17 (×6): 3 [IU] via SUBCUTANEOUS
  Administered 2015-05-17 – 2015-05-18 (×4): 4 [IU] via SUBCUTANEOUS
  Administered 2015-05-18 (×3): 3 [IU] via SUBCUTANEOUS
  Administered 2015-05-18: 4 [IU] via SUBCUTANEOUS
  Administered 2015-05-19: 3 [IU] via SUBCUTANEOUS
  Administered 2015-05-19: 4 [IU] via SUBCUTANEOUS
  Administered 2015-05-19 (×2): 3 [IU] via SUBCUTANEOUS
  Administered 2015-05-19 (×2): 4 [IU] via SUBCUTANEOUS
  Administered 2015-05-20 – 2015-05-21 (×10): 3 [IU] via SUBCUTANEOUS
  Administered 2015-05-21 – 2015-05-22 (×3): 4 [IU] via SUBCUTANEOUS
  Administered 2015-05-22: 3 [IU] via SUBCUTANEOUS
  Administered 2015-05-22: 4 [IU] via SUBCUTANEOUS
  Administered 2015-05-22 – 2015-05-23 (×3): 3 [IU] via SUBCUTANEOUS

## 2015-04-28 MED ORDER — CETYLPYRIDINIUM CHLORIDE 0.05 % MT LIQD
7.0000 mL | Freq: Two times a day (BID) | OROMUCOSAL | Status: DC
  Administered 2015-04-29 – 2015-05-02 (×7): 7 mL via OROMUCOSAL

## 2015-04-28 MED ORDER — ALBUTEROL SULFATE (2.5 MG/3ML) 0.083% IN NEBU: 2.5000 mg | INHALATION_SOLUTION | RESPIRATORY_TRACT | Status: DC | PRN

## 2015-04-28 MED ORDER — MORPHINE SULFATE (PF) 4 MG/ML IV SOLN
4.0000 mg | Freq: Once | INTRAVENOUS | Status: AC
  Administered 2015-04-28: 4 mg via INTRAVENOUS
  Filled 2015-04-28: qty 1

## 2015-04-28 MED ORDER — SODIUM CHLORIDE 0.9 % IV SOLN
INTRAVENOUS | Status: DC
  Administered 2015-04-28: 125 mL/h via INTRAVENOUS

## 2015-04-28 MED ORDER — IOHEXOL 300 MG/ML  SOLN
25.0000 mL | Freq: Once | INTRAMUSCULAR | Status: AC | PRN
  Administered 2015-04-28: 25 mL via ORAL

## 2015-04-28 MED ORDER — INSULIN GLARGINE 100 UNIT/ML ~~LOC~~ SOLN
15.0000 [IU] | Freq: Every day | SUBCUTANEOUS | Status: DC
  Administered 2015-04-28: 15 [IU] via SUBCUTANEOUS
  Filled 2015-04-28: qty 0.15

## 2015-04-28 MED ORDER — SODIUM CHLORIDE 0.9 % IJ SOLN
3.0000 mL | Freq: Two times a day (BID) | INTRAMUSCULAR | Status: DC
  Administered 2015-04-28 – 2015-05-23 (×31): 3 mL via INTRAVENOUS

## 2015-04-28 MED ORDER — ACETAMINOPHEN 325 MG PO TABS
650.0000 mg | ORAL_TABLET | Freq: Four times a day (QID) | ORAL | Status: DC | PRN
  Administered 2015-05-05 – 2015-05-15 (×5): 650 mg via ORAL
  Filled 2015-04-28 (×6): qty 2

## 2015-04-28 MED ORDER — HEPARIN SODIUM (PORCINE) 5000 UNIT/ML IJ SOLN
5000.0000 [IU] | Freq: Three times a day (TID) | INTRAMUSCULAR | Status: DC
  Administered 2015-04-28 – 2015-05-24 (×77): 5000 [IU] via SUBCUTANEOUS
  Filled 2015-04-28 (×77): qty 1

## 2015-04-28 MED ORDER — OXYCODONE HCL 5 MG PO TABS: 5.0000 mg | ORAL_TABLET | ORAL | Status: DC | PRN

## 2015-04-28 NOTE — ED Notes (Signed)
Pt not able to urinate.   

## 2015-04-28 NOTE — ED Notes (Signed)
Patient transported to CT 

## 2015-04-28 NOTE — ED Provider Notes (Signed)
CSN: [id number]     Arrival date & time 04/28/15  1131 History   First MD Initiated Contact with Patient 04/28/15 1145     Chief Complaint  Patient presents with  . Diarrhea  . Abdominal Pain    HPI   Victoria Morrow is a 65 y.o. female with a PMH of DM, olivopontocerebellar atrophy who presents to the ED with constant diffuse abdominal pain, nausea, vomiting, and diarrhea, which she states started this morning prior to arrival. She reports talking exacerbates her pain. She has not tried anything for symptom relief. She reports subjective fever and chills. She denies hematemesis, hematochezia, melena, dysuria, urgency, frequency.   Past Medical History  Diagnosis Date  . Olivopontocerebellar atrophy (HCC)     Mgmt Dr Vickey Huger  . Diabetes mellitus     Type II. Insulin dependent. Diabetic nephropathy.   History reviewed. No pertinent past surgical history. Family History  Problem Relation Age of Onset  . Kidney disease Mother   . Heart disease Father    Social History  Substance Use Topics  . Smoking status: Never Smoker   . Smokeless tobacco: None  . Alcohol Use: No   OB History    No data available      Review of Systems  Constitutional: Positive for fever and chills.  Respiratory: Negative for shortness of breath.   Cardiovascular: Negative for chest pain.  Gastrointestinal: Positive for nausea, vomiting, abdominal pain and diarrhea. Negative for constipation and blood in stool.  Genitourinary: Negative for dysuria, urgency and frequency.  All other systems reviewed and are negative.     Allergies  Review of patient's allergies indicates no known allergies.  Home Medications   Prior to Admission medications   Medication Sig Start Date End Date Taking? Authorizing Provider  acetaminophen (TYLENOL) 500 MG tablet Take 500 mg by mouth every 6 (six) hours as needed. pain 11/18/10  Yes Historical Provider, MD  aspirin 81 MG tablet Take 81 mg by mouth daily.     Yes  Historical Provider, MD  atorvastatin (LIPITOR) 10 MG tablet Take 10 mg by mouth daily. 03/19/15  Yes Historical Provider, MD  Calcium Carbonate-Vitamin D (OYSTER CALCIUM + D) 500-125 MG-UNIT TABS Take 2 tablets by mouth daily.     Yes Historical Provider, MD  fenofibrate 160 MG tablet Take 160 mg by mouth daily. 02/12/15  Yes Historical Provider, MD  ferrous sulfate 325 (65 FE) MG tablet Take 325 mg by mouth daily with breakfast.    Yes Historical Provider, MD  insulin glargine (LANTUS) 100 UNIT/ML injection Inject 55 Units into the skin at bedtime.    Yes Historical Provider, MD  lisinopril (PRINIVIL,ZESTRIL) 30 MG tablet Take 30 mg by mouth daily.   Yes Historical Provider, MD  metFORMIN (GLUCOPHAGE) 500 MG tablet Take 500 mg by mouth daily. 04/15/15  Yes Historical Provider, MD    BP 158/93 mmHg  Pulse 86  Temp(Src) 97.8 F (36.6 C) (Oral)  Resp 16  SpO2 100% Physical Exam  Constitutional: She is oriented to person, place, and time. She appears well-developed and well-nourished. No distress.  HENT:  Head: Normocephalic and atraumatic.  Right Ear: External ear normal.  Left Ear: External ear normal.  Nose: Nose normal.  Mouth/Throat: Uvula is midline, oropharynx is clear and moist and mucous membranes are normal.  Eyes: Conjunctivae, EOM and lids are normal. Pupils are equal, round, and reactive to light. Right eye exhibits no discharge. Left eye exhibits no discharge. No scleral icterus.  Neck: Normal range of motion. Neck supple.  Cardiovascular: Normal rate, regular rhythm, normal heart sounds, intact distal pulses and normal pulses.   Pulmonary/Chest: Effort normal and breath sounds normal. No respiratory distress. She has no wheezes. She has no rales.  Abdominal: Soft. Normal appearance and bowel sounds are normal. She exhibits no distension and no mass. There is tenderness. There is CVA tenderness. There is no rigidity, no rebound and no guarding.  Diffuse TTP. No rebound,  guarding, or masses. Bilateral CVA tenderness.  Musculoskeletal: Normal range of motion. She exhibits no edema or tenderness.  Neurological: She is alert and oriented to person, place, and time. She has normal strength. No sensory deficit.  Skin: Skin is warm, dry and intact. No rash noted. She is not diaphoretic. No erythema. No pallor.  Psychiatric: She has a normal mood and affect. Her speech is normal and behavior is normal.  Nursing note and vitals reviewed.   ED Course  Procedures (including critical care time) Labs Review Labs Reviewed  COMPREHENSIVE METABOLIC PANEL - Abnormal; Notable for the following:    CO2 17 (*)    Glucose, Bld 356 (*)    BUN 31 (*)    Creatinine, Ser 2.08 (*)    Calcium 10.6 (*)    AST 124 (*)    ALT 67 (*)    GFR calc non Af Amer 24 (*)    GFR calc Af Amer 27 (*)    All other components within normal limits  CBC - Abnormal; Notable for the following:    WBC 33.2 (*)    Platelets 460 (*)    All other components within normal limits  LIPASE, BLOOD - Abnormal; Notable for the following:    Lipase <3000 (*)    All other components within normal limits  I-STAT CG4 LACTIC ACID, ED - Abnormal; Notable for the following:    Lactic Acid, Venous 4.20 (*)    All other components within normal limits  CULTURE, BLOOD (ROUTINE X 2)  CULTURE, BLOOD (ROUTINE X 2)  URINE CULTURE  URINALYSIS, ROUTINE W REFLEX MICROSCOPIC (NOT AT [facility])  I-STAT CG4 LACTIC ACID, ED    Imaging Review Ct Abdomen Pelvis Wo Contrast  04/28/2015  CLINICAL DATA:  Diarrhea and abdominal cramping for 3 hours. Blood glucose 305. Abdominal pain. EXAM: CT ABDOMEN AND PELVIS WITHOUT CONTRAST TECHNIQUE: Multidetector CT imaging of the abdomen and pelvis was performed following the standard protocol without IV contrast. COMPARISON:  Abdominal ultrasound of 05/10/2014.  No prior CT. FINDINGS: Lower chest: Clear lung bases. Normal heart size without pericardial or pleural effusion. A tiny hiatal  hernia. Hepatobiliary: Normal noncontrast appearance of the liver. Gallstones. No pericholecystic edema. No choledocholithiasis or biliary duct dilatation. Pancreas: Enlargement of the pancreatic neck, head, and uncinate process. Peripancreatic edema, without well-defined collection. No pancreatic duct dilatation. Spleen: Normal in size, without focal abnormality. Adrenals/Urinary Tract: Normal adrenal glands. Mild left and moderate right renal cortical thinning. No hydronephrosis. No hydroureter or ureteric calculi. No bladder calculi. Stomach/Bowel: Normal remainder of the stomach. Normal colon and terminal ileum. The appendix is mildly prominent, measuring up to 8 mm on image 58/ series 2. No periappendiceal inflammation. Favored to be within normal variation. Gas and soft tissue density cephalad the transverse duodenum on image 36/ series 2 is favored to be related to a duodenal diverticulum. No edema posterior to this area. Small bowel otherwise unremarkable. Vascular/Lymphatic: Aortic and branch vessel atherosclerosis. No abdominopelvic adenopathy. Reproductive: Normal uterus and adnexa. Other: No  free pelvic fluid. small volume perihepatic ascites. Mild pelvic floor laxity. Musculoskeletal: Prominent disc bulge at L4-5. IMPRESSION: 1. Upper abdominal edema, eccentric right. Centered about the pancreatic head, uncinate process, and neck. Favor pancreatitis. Correlate with pancreatic enzymes (pending). If these are abnormal, less likely differential consideration would include peptic ulcer disease. 2. Cholelithiasis without specific evidence of cholecystitis or biliary duct dilatation. 3. Trace perihepatic ascites, likely secondary. 4. Small hiatal hernia. 5. Pelvic floor laxity. 6. Renal atrophy. Electronically Signed   By: Jeronimo GreavesKyle  Talbot M.D.   On: 04/28/2015 14:49   I have personally reviewed and evaluated these images and lab results as part of my medical decision-making.   EKG Interpretation None       MDM   Final diagnoses:  Abdominal pain  Acute pancreatitis, unspecified pancreatitis type    65 year old female presents with abdominal pain and diarrhea, which she states started earlier today prior to arrival. Reports associated nausea and vomiting. Denies hematemesis, hematochezia, melena, dysuria, urgency, frequency. Reports subjective fever and chills.  Patient is afebrile. Blood pressure 90s systolic. Patient given 1L NS bolus. Heart regular rate and rhythm. Lungs clear to auscultation bilaterally. Abdomen soft, with mild diffuse tenderness to palpation. No rebound, guarding, or masses. Bilateral CVA tenderness.  CBC remarkable for leukocytosis of 33.2. CMP remarkable for glucose elevated at 356, anion gap within normal limits, creatinine elevated at 2.08, AST elevated at 124, ALT elevated at 67. Lipase significantly elevated at <3000. Lactic acid 4.20.  Patient given fluids and pain medication.  Will obtain CT abdomen pelvis.  CT abdomen pelvis remarkable for gallstones and upper abdominal edema centered about pancreas, consistent with pancreatitis. Hospitalist consulted for admisson. Spoke with Dr. Rito EhrlichKrishnan, who will admit for further evaluation and management. General surgery consulted. Spoke with Dr. Andrey CampanileWilson, who advised surgery will see the patient but that pancreatitis will need to be resolved prior to intervention and that the patient can follow up as an outpatient for cholesytectomy.   Patient discussed with and seen by Dr. Fredderick PhenixBelfi.   BP 158/93 mmHg  Pulse 86  Temp(Src) 97.8 F (36.6 C) (Oral)  Resp 16  SpO2 100%   Mady Gemmalizabeth C Westfall, PA-C 04/28/15 1637  Rolan BuccoMelanie Belfi, MD 04/29/15 [phone]

## 2015-04-28 NOTE — ED Notes (Signed)
MAIN LAB IS TO DRAW BLOOD CULTURES AND LIPASE

## 2015-04-28 NOTE — ED Notes (Signed)
Md Belfi aware of istat lactic result

## 2015-04-28 NOTE — ED Notes (Signed)
Unsuccessful blood draw x2 main lab called

## 2015-04-28 NOTE — Progress Notes (Signed)
Called by PA due to gallstone pancreatitis.  Advised pa that we would see her in consultation However given degree of peripancreatic inflammation on CT it is unlikely that pt would be ready for cholecystectomy anytime soon and perhaps not during hospitalization depending on she responds.   Pt would benefit from a GI consult sooner than a surgery consult No cholecystitis on Ct - so no abx for gb Defer abx for pancreatitis to medicine and GI - Full consult to follow  Mary SellaEric M. Andrey CampanileWilson, MD, FACS General, Bariatric, & Minimally Invasive Surgery [facility], [place]

## 2015-04-28 NOTE — ED Notes (Signed)
Per EMS, Pt c/o diarrhea and abdominal cramping x 3 hours. Denies blood in diarrhea. A&Ox4. Pt from home. CBG 305. Pt only take insulin at night. Pt not very talkative.

## 2015-04-28 NOTE — Progress Notes (Signed)
ANTIBIOTIC CONSULT NOTE - INITIAL  Pharmacy Consult for Primaxin Indication: rule out sepsis  No Known Allergies  Patient Measurements: Height: 5\' 2"  (157.5 cm) Weight: 133 lb 9.6 oz (60.6 kg) IBW/kg (Calculated) : 50.1  Vital Signs: Temp: 97.8 F (36.6 C) (01/01 1140) Temp Source: Oral (01/01 1140) BP: 163/96 mmHg (01/01 1750) Pulse Rate: 111 (01/01 1750) Intake/Output from previous day:   Intake/Output from this shift:    Labs:  Recent Labs  04/28/15 1225  WBC 33.2*  HGB 13.9  PLT 460*  CREATININE 2.08*   Estimated Creatinine Clearance: 22.8 mL/min (by C-G formula based on Cr of 2.08). No results for input(s): VANCOTROUGH, VANCOPEAK, VANCORANDOM, GENTTROUGH, GENTPEAK, GENTRANDOM, TOBRATROUGH, TOBRAPEAK, TOBRARND, AMIKACINPEAK, AMIKACINTROU, AMIKACIN in the last 72 hours.   Microbiology: No results found for this or any previous visit (from the past 720 hour(s)).  Medical History: Past Medical History  Diagnosis Date  . Olivopontocerebellar atrophy (HCC)     Mgmt Dr Vickey Hugerohmeier  . Diabetes mellitus     Type II. Insulin dependent. Diabetic nephropathy.   Medications:  Scheduled:  . heparin  5,000 Units Subcutaneous 3 times per day  . imipenem-cilastatin  250 mg Intravenous Once  . [START ON 04/29/2015] imipenem-cilastatin  250 mg Intravenous Q8H  . insulin aspart  0-20 Units Subcutaneous 6 times per day  . insulin glargine  15 Units Subcutaneous QHS  . sodium chloride  3 mL Intravenous Q12H   Anti-infectives    Start     Dose/Rate Route Frequency Ordered Stop   04/29/15 0200  imipenem-cilastatin (PRIMAXIN) 250 mg in sodium chloride 0.9 % 100 mL IVPB     250 mg 200 mL/hr over 30 Minutes Intravenous Every 8 hours 04/28/15 1812     04/28/15 1830  imipenem-cilastatin (PRIMAXIN) 250 mg in sodium chloride 0.9 % 100 mL IVPB     250 mg 200 mL/hr over 30 Minutes Intravenous  Once 04/28/15 1812       Assessment: [age] yoF with gallstone pancreatitis, hx of CKD, HPL,  DM2-insulin dependent.  To ED with N/V/D, abd pain. Lactic acidosis, rule out sepsis, begin Primaxin per Pharmacy dosing.  Goal of Therapy:  Antibioti, dose, schedule appropriate for treatment, renal function  Plan:   Primaxin 250mg  IV q8hr  Otho BellowsGreen, Tariyah Pendry L PharmD Pager [phone] 04/28/2015, 6:25 PM

## 2015-04-28 NOTE — ED Notes (Signed)
Pt attempted to urinate on bedpan. Unsuccessful.

## 2015-04-28 NOTE — H&P (Signed)
[facility] History and Physical  Victoria Morrow HER:[id number] DOB: 28-Nov-1950 DOA: 04/28/2015   PCP: Rachell Cipro, MD  Specialists: Patient is followed by Dr. Moshe Cipro with nephrology  Chief Complaint: Nausea, vomiting, diarrhea and abdominal pain  HPI: Victoria Morrow is a 65 y.o. female with a past medical history of type 2 diabetes on insulin, chronic kidney disease of unknown stage, hyperlipidemia, who was in her usual state of health till earlier today when she started developing nausea followed by vomiting. She also started having upper abdominal pain. Symptoms got worse and she decided to come into the hospital for further evaluation. She denies having had similar symptoms in the past. Pain is located in the upper abdomen without any radiation. It's achy pain 10 out of 10 in intensity. No aggravating or relieving factors. No known precipitant factors. Denies any recent antibiotic use. No sick contacts. No recent hospitalization. Denies any fever but has had chills. Denies any blood in the emesis or in the stool.  Home Medications: Prior to Admission medications   Medication Sig Start Date End Date Taking? Authorizing Provider  acetaminophen (TYLENOL) 500 MG tablet Take 500 mg by mouth every 6 (six) hours as needed. pain 11/18/10  Yes Historical Provider, MD  aspirin 81 MG tablet Take 81 mg by mouth daily.     Yes Historical Provider, MD  atorvastatin (LIPITOR) 10 MG tablet Take 10 mg by mouth daily. 03/19/15  Yes Historical Provider, MD  Calcium Carbonate-Vitamin D (OYSTER CALCIUM + D) 500-125 MG-UNIT TABS Take 2 tablets by mouth daily.     Yes Historical Provider, MD  fenofibrate 160 MG tablet Take 160 mg by mouth daily. 02/12/15  Yes Historical Provider, MD  ferrous sulfate 325 (65 FE) MG tablet Take 325 mg by mouth daily with breakfast.    Yes Historical Provider, MD  insulin glargine (LANTUS) 100 UNIT/ML injection Inject 55 Units into the skin at bedtime.    Yes  Historical Provider, MD  lisinopril (PRINIVIL,ZESTRIL) 30 MG tablet Take 30 mg by mouth daily.   Yes Historical Provider, MD  metFORMIN (GLUCOPHAGE) 500 MG tablet Take 500 mg by mouth daily. 04/15/15  Yes Historical Provider, MD    Allergies: No Known Allergies  Past Medical History: Past Medical History  Diagnosis Date  . Olivopontocerebellar atrophy (HCC)     Mgmt Dr Brett Fairy  . Diabetes mellitus     Type II. Insulin dependent. Diabetic nephropathy.    History reviewed. No pertinent past surgical history.  Social History: She lives in [place] with her husband. Does not work. Denies any history of smoking or alcohol use. No illicit drug use. Independent with daily activities.  Family History:  Family History  Problem Relation Age of Onset  . Kidney disease Mother   . Heart disease Father      Review of Systems - History obtained from the patient General ROS: positive for  - fatigue Psychological ROS: negative Ophthalmic ROS: negative ENT ROS: negative Allergy and Immunology ROS: negative Hematological and Lymphatic ROS: negative Endocrine ROS: negative Respiratory ROS: no cough, shortness of breath, or wheezing Cardiovascular ROS: no chest pain or dyspnea on exertion Gastrointestinal ROS: as in hpi Genito-Urinary ROS: no dysuria, trouble voiding, or hematuria Musculoskeletal ROS: negative Neurological ROS: no TIA or stroke symptoms Dermatological ROS: negative  Physical Examination  Filed Vitals:   04/28/15 1140 04/28/15 1330 04/28/15 1430 04/28/15 1515  BP: 98/62 119/55 127/66 158/93  Pulse: 81 82 86 86  Temp: 97.8 F (36.6 C)  TempSrc: Oral     Resp: _0 SpO2: 100% 100% 100% 100%    BP 158/93 mmHg  Pulse 86  Temp(Src) 97.8 F (36.6 C) (Oral)  Resp 16  SpO2 100%  General appearance: alert, cooperative, appears stated age and no distress Head: Normocephalic, without obvious abnormality, atraumatic Eyes: conjunctivae/corneas clear.  PERRL, EOM's intact.  Throat: dry mm Neck: no adenopathy, no carotid bruit, no JVD, supple, symmetrical, trachea midline and thyroid not enlarged, symmetric, no tenderness/mass/nodules Resp: clear to auscultation bilaterally Cardio: regular rate and rhythm, S1, S2 normal, no murmur, click, rub or gallop GI: Abdomen is obese. Soft. Tender in the epigastric as well as right upper quadrant areas. No rebound, rigidity or guarding. No masses or organomegaly. Bowel sounds are present. Extremities: extremities normal, atraumatic, no cyanosis or edema Pulses: 2+ and symmetric Skin: Skin color normal. No rashes or lesions Lymph nodes: Cervical, supraclavicular, and axillary nodes normal. Neurologic: Alert and oriented 3. No facial asymmetry. Motor strength is equal bilateral upper and lower extremities.  Laboratory Data: Results for orders placed or performed during the hospital encounter of 04/28/15 (from the past 48 hour(s))  Comprehensive metabolic panel     Status: Abnormal   Collection Time: 04/28/15 12:25 PM  Result Value Ref Range   Sodium 140 135 - 145 mmol/L   Potassium 4.2 3.5 - 5.1 mmol/L   Chloride 109 101 - 111 mmol/L   CO2 17 (L) 22 - 32 mmol/L   Glucose, Bld 356 (H) 65 - 99 mg/dL   BUN 31 (H) 6 - 20 mg/dL   Creatinine, Ser 2.08 (H) 0.44 - 1.00 mg/dL   Calcium 10.6 (H) 8.9 - 10.3 mg/dL   Total Protein 7.3 6.5 - 8.1 g/dL   Albumin 4.2 3.5 - 5.0 g/dL   AST 124 (H) 15 - 41 U/L   ALT 67 (H) 14 - 54 U/L   Alkaline Phosphatase 78 38 - 126 U/L   Total Bilirubin 1.0 0.3 - 1.2 mg/dL   GFR calc non Af Amer 24 (L) >60 mL/min   GFR calc Af Amer 27 (L) >60 mL/min    Comment: (NOTE) The eGFR has been calculated using the CKD EPI equation. This calculation has not been validated in all clinical situations. eGFR's persistently <60 mL/min signify possible Chronic Kidney Disease.    Anion gap 14 5 - 15  CBC     Status: Abnormal   Collection Time: 04/28/15 12:25 PM  Result Value Ref  Range   WBC 33.2 (H) 4.0 - 10.5 K/uL   RBC 4.69 3.87 - 5.11 MIL/uL   Hemoglobin 13.9 12.0 - 15.0 g/dL   HCT 40.8 36.0 - 46.0 %   MCV 87.0 78.0 - 100.0 fL   MCH 29.6 26.0 - 34.0 pg   MCHC 34.1 30.0 - 36.0 g/dL   RDW 13.6 11.5 - 15.5 %   Platelets 460 (H) 150 - 400 K/uL  I-Stat CG4 Lactic Acid, ED     Status: Abnormal   Collection Time: 04/28/15  1:17 PM  Result Value Ref Range   Lactic Acid, Venous 4.20 (HH) 0.5 - 2.0 mmol/L   Comment NOTIFIED PHYSICIAN   Lipase, blood     Status: Abnormal   Collection Time: 04/28/15  1:50 PM  Result Value Ref Range   Lipase <3000 (H) 11 - 51 U/L    Comment: RESULTS CONFIRMED BY MANUAL DILUTION    Radiology Reports: Ct Abdomen Pelvis Wo Contrast  04/28/2015  CLINICAL  DATA:  Diarrhea and abdominal cramping for 3 hours. Blood glucose 305. Abdominal pain. EXAM: CT ABDOMEN AND PELVIS WITHOUT CONTRAST TECHNIQUE: Multidetector CT imaging of the abdomen and pelvis was performed following the standard protocol without IV contrast. COMPARISON:  Abdominal ultrasound of 05/10/2014.  No prior CT. FINDINGS: Lower chest: Clear lung bases. Normal heart size without pericardial or pleural effusion. A tiny hiatal hernia. Hepatobiliary: Normal noncontrast appearance of the liver. Gallstones. No pericholecystic edema. No choledocholithiasis or biliary duct dilatation. Pancreas: Enlargement of the pancreatic neck, head, and uncinate process. Peripancreatic edema, without well-defined collection. No pancreatic duct dilatation. Spleen: Normal in size, without focal abnormality. Adrenals/Urinary Tract: Normal adrenal glands. Mild left and moderate right renal cortical thinning. No hydronephrosis. No hydroureter or ureteric calculi. No bladder calculi. Stomach/Bowel: Normal remainder of the stomach. Normal colon and terminal ileum. The appendix is mildly prominent, measuring up to 8 mm on image 58/ series 2. No periappendiceal inflammation. Favored to be within normal variation. Gas  and soft tissue density cephalad the transverse duodenum on image 36/ series 2 is favored to be related to a duodenal diverticulum. No edema posterior to this area. Small bowel otherwise unremarkable. Vascular/Lymphatic: Aortic and branch vessel atherosclerosis. No abdominopelvic adenopathy. Reproductive: Normal uterus and adnexa. Other: No free pelvic fluid. small volume perihepatic ascites. Mild pelvic floor laxity. Musculoskeletal: Prominent disc bulge at L4-5. IMPRESSION: 1. Upper abdominal edema, eccentric right. Centered about the pancreatic head, uncinate process, and neck. Favor pancreatitis. Correlate with pancreatic enzymes (pending). If these are abnormal, less likely differential consideration would include peptic ulcer disease. 2. Cholelithiasis without specific evidence of cholecystitis or biliary duct dilatation. 3. Trace perihepatic ascites, likely secondary. 4. Small hiatal hernia. 5. Pelvic floor laxity. 6. Renal atrophy. Electronically Signed   By: Abigail Miyamoto M.D.   On: 04/28/2015 14:49     Problem List  Principal Problem:   Acute pancreatitis Active Problems:   Gallstones   Transaminitis   DM type 2 (diabetes mellitus, type 2) (HCC)   Hyperlipidemia   CKD (chronic kidney disease)   ARF (acute renal failure) (HCC)   Assessment: This is a 65 year old African-American female with past medical history as stated earlier, who presents with abdominal pain, nausea, vomiting and diarrhea. CT scan is suggestive of acute pancreatitis. Her lipase level appears to be significantly elevated. She also has transaminitis. There is evidence for renal insufficiency. She also has lactic acidosis.   Plan: #1 Acute pancreatitis: CT scan also showed gallstones. Patient denies any history of alcohol intake. This is probably gallstone pancreatitis. Ultrasound of the abdomen will be obtained. Triglyceride levels will be checked. She'll be given IV fluids. She'll be kept nothing by mouth. Will need to  involve general surgery once she is more medically stable.  #2 Lactic acidosis/possible sepsis: She is afebrile. She does have significantly elevated WBC. Most likely this is all secondary to her severe acute pancreatitis. Anion gap is normal and so DKA is thought to be less likely. Lactic acid levels will be repeated. Since this pancreatitis appears to be severe we will initiate imipenem for now. No evidence for pancreatic necrosis noted on CT scan. Check pro-calcitonin levels. Follow-up on blood cultures. Repeat basic metabolic panel later today.  #3 Possible acute on chronic kidney disease. Stage is unknown. UA is pending. IV fluids will be continued. Monitor urine output. No hydronephrosis noted on CT scan.  #4 Transaminitis: Probably related to her acute pancreatitis and gallstones. Ultrasound of the right upper quadrant will be  ordered. Hepatitis panel will be ordered.  #5 diabetes mellitus type 2, poorly controlled, with chronic kidney disease: Continue Lantus at a lower dose. Monitor blood sugars closely. Check HbA1c.  DVT Prophylaxis: Subcutaneous heparin Code Status: Full code Family Communication: Discussed with the patient and her husband  Disposition Plan: Patient will need close monitoring in the step down unit. Inpatient hospitalization.   Further management decisions will depend on results of further testing and patient's response to treatment.   [facility]  [facility] Pager [phone]  If 7PM-7AM, please contact night-coverage [email or web address] Password [email or web address]  04/28/2015, 4:13 PM

## 2015-04-28 NOTE — ED Notes (Signed)
Bed: WA02 Expected date: 04/28/15 Expected time: 11:31 AM Means of arrival: Ambulance Comments: Diarrhea x 3 hours CBG 355

## 2015-04-28 NOTE — Consult Note (Signed)
Reason for Consult:gallstone pancreatitis Referring Physician: Dr Terence Lux is an 65 y.o. female.  HPI: 65 yo AAF with DM, CKD who was in her usual state of health til earlier today when she developed acute onset diarrhea, nausea/vomiting, upper abd pain. Reports she was completely fine yesterday. Eating/drinking well. Denies changes in meds. Denies etoh. Came to ED found to ARF, severe pancreatitis, elevated lactate,  mildly elevated transaminases. O/w ROS negative.   Past Medical History  Diagnosis Date  . Olivopontocerebellar atrophy (HCC)     Mgmt Dr Brett Fairy  . Diabetes mellitus     Type II. Insulin dependent. Diabetic nephropathy.    History reviewed. No pertinent past surgical history.  Family History  Problem Relation Age of Onset  . Kidney disease Mother   . Heart disease Father     Social History:  reports that she has never smoked. She does not have any smokeless tobacco history on file. She reports that she does not drink alcohol or use illicit drugs.  Allergies: No Known Allergies  Medications: I have reviewed the patient's current medications.  Results for orders placed or performed during the hospital encounter of 04/28/15 (from the past 48 hour(s))  Comprehensive metabolic panel     Status: Abnormal   Collection Time: 04/28/15 12:25 PM  Result Value Ref Range   Sodium 140 135 - 145 mmol/L   Potassium 4.2 3.5 - 5.1 mmol/L   Chloride 109 101 - 111 mmol/L   CO2 17 (L) 22 - 32 mmol/L   Glucose, Bld 356 (H) 65 - 99 mg/dL   BUN 31 (H) 6 - 20 mg/dL   Creatinine, Ser 2.08 (H) 0.44 - 1.00 mg/dL   Calcium 10.6 (H) 8.9 - 10.3 mg/dL   Total Protein 7.3 6.5 - 8.1 g/dL   Albumin 4.2 3.5 - 5.0 g/dL   AST 124 (H) 15 - 41 U/L   ALT 67 (H) 14 - 54 U/L   Alkaline Phosphatase 78 38 - 126 U/L   Total Bilirubin 1.0 0.3 - 1.2 mg/dL   GFR calc non Af Amer 24 (L) >60 mL/min   GFR calc Af Amer 27 (L) >60 mL/min    Comment: (NOTE) The eGFR has been calculated  using the CKD EPI equation. This calculation has not been validated in all clinical situations. eGFR's persistently <60 mL/min signify possible Chronic Kidney Disease.    Anion gap 14 5 - 15  CBC     Status: Abnormal   Collection Time: 04/28/15 12:25 PM  Result Value Ref Range   WBC 33.2 (H) 4.0 - 10.5 K/uL   RBC 4.69 3.87 - 5.11 MIL/uL   Hemoglobin 13.9 12.0 - 15.0 g/dL   HCT 40.8 36.0 - 46.0 %   MCV 87.0 78.0 - 100.0 fL   MCH 29.6 26.0 - 34.0 pg   MCHC 34.1 30.0 - 36.0 g/dL   RDW 13.6 11.5 - 15.5 %   Platelets 460 (H) 150 - 400 K/uL  I-Stat CG4 Lactic Acid, ED     Status: Abnormal   Collection Time: 04/28/15  1:17 PM  Result Value Ref Range   Lactic Acid, Venous 4.20 (HH) 0.5 - 2.0 mmol/L   Comment NOTIFIED PHYSICIAN   Lipase, blood     Status: Abnormal   Collection Time: 04/28/15  1:50 PM  Result Value Ref Range   Lipase <3000 (H) 11 - 51 U/L    Comment: RESULTS CONFIRMED BY MANUAL DILUTION  I-Stat CG4 Lactic Acid,  ED     Status: None   Collection Time: 04/28/15  5:05 PM  Result Value Ref Range   Lactic Acid, Venous 1.74 0.5 - 2.0 mmol/L  Protime-INR     Status: None   Collection Time: 04/28/15  6:20 PM  Result Value Ref Range   Prothrombin Time 13.8 11.6 - 15.2 seconds   INR 1.04 0.00 - 1.49  APTT     Status: None   Collection Time: 04/28/15  6:20 PM  Result Value Ref Range   aPTT 27 24 - 37 seconds  Lactic acid, plasma     Status: None   Collection Time: 04/28/15  6:20 PM  Result Value Ref Range   Lactic Acid, Venous 2.0 0.5 - 2.0 mmol/L  Basic metabolic panel     Status: Abnormal   Collection Time: 04/28/15  6:20 PM  Result Value Ref Range   Sodium 141 135 - 145 mmol/L   Potassium 5.6 (H) 3.5 - 5.1 mmol/L    Comment: DELTA CHECK NOTED NO VISIBLE HEMOLYSIS    Chloride 117 (H) 101 - 111 mmol/L   CO2 13 (L) 22 - 32 mmol/L   Glucose, Bld 362 (H) 65 - 99 mg/dL   BUN 37 (H) 6 - 20 mg/dL   Creatinine, Ser 2.13 (H) 0.44 - 1.00 mg/dL   Calcium 9.2 8.9 - 10.3  mg/dL   GFR calc non Af Amer 23 (L) >60 mL/min   GFR calc Af Amer 27 (L) >60 mL/min    Comment: (NOTE) The eGFR has been calculated using the CKD EPI equation. This calculation has not been validated in all clinical situations. eGFR's persistently <60 mL/min signify possible Chronic Kidney Disease.    Anion gap 11 5 - 15    Ct Abdomen Pelvis Wo Contrast  04/28/2015  CLINICAL DATA:  Diarrhea and abdominal cramping for 3 hours. Blood glucose 305. Abdominal pain. EXAM: CT ABDOMEN AND PELVIS WITHOUT CONTRAST TECHNIQUE: Multidetector CT imaging of the abdomen and pelvis was performed following the standard protocol without IV contrast. COMPARISON:  Abdominal ultrasound of 05/10/2014.  No prior CT. FINDINGS: Lower chest: Clear lung bases. Normal heart size without pericardial or pleural effusion. A tiny hiatal hernia. Hepatobiliary: Normal noncontrast appearance of the liver. Gallstones. No pericholecystic edema. No choledocholithiasis or biliary duct dilatation. Pancreas: Enlargement of the pancreatic neck, head, and uncinate process. Peripancreatic edema, without well-defined collection. No pancreatic duct dilatation. Spleen: Normal in size, without focal abnormality. Adrenals/Urinary Tract: Normal adrenal glands. Mild left and moderate right renal cortical thinning. No hydronephrosis. No hydroureter or ureteric calculi. No bladder calculi. Stomach/Bowel: Normal remainder of the stomach. Normal colon and terminal ileum. The appendix is mildly prominent, measuring up to 8 mm on image 58/ series 2. No periappendiceal inflammation. Favored to be within normal variation. Gas and soft tissue density cephalad the transverse duodenum on image 36/ series 2 is favored to be related to a duodenal diverticulum. No edema posterior to this area. Small bowel otherwise unremarkable. Vascular/Lymphatic: Aortic and branch vessel atherosclerosis. No abdominopelvic adenopathy. Reproductive: Normal uterus and adnexa. Other: No  free pelvic fluid. small volume perihepatic ascites. Mild pelvic floor laxity. Musculoskeletal: Prominent disc bulge at L4-5. IMPRESSION: 1. Upper abdominal edema, eccentric right. Centered about the pancreatic head, uncinate process, and neck. Favor pancreatitis. Correlate with pancreatic enzymes (pending). If these are abnormal, less likely differential consideration would include peptic ulcer disease. 2. Cholelithiasis without specific evidence of cholecystitis or biliary duct dilatation. 3. Trace perihepatic ascites, likely secondary.  4. Small hiatal hernia. 5. Pelvic floor laxity. 6. Renal atrophy. Electronically Signed   By: Abigail Miyamoto M.D.   On: 04/28/2015 14:49    Review of Systems  Constitutional: Positive for chills. Negative for weight loss.  HENT: Negative for nosebleeds.   Eyes: Negative for blurred vision.  Respiratory: Negative for shortness of breath.   Cardiovascular: Negative for chest pain, palpitations, orthopnea and PND.       Denies DOE  Gastrointestinal: Positive for nausea, vomiting, abdominal pain and diarrhea.  Genitourinary: Negative for dysuria and hematuria.  Musculoskeletal: Negative.   Skin: Negative for itching and rash.  Neurological: Negative for dizziness, focal weakness, seizures, loss of consciousness and headaches.       Denies TIAs, amaurosis fugax  Endo/Heme/Allergies: Does not bruise/bleed easily.  Psychiatric/Behavioral: The patient is not nervous/anxious.    Blood pressure 153/72, pulse 107, temperature 97.8 F (36.6 C), temperature source Oral, resp. rate 27, height 5' 2"  (1.575 m), weight 60.6 kg (133 lb 9.6 oz), SpO2 99 %. Physical Exam  Constitutional: She appears well-developed and well-nourished. She appears lethargic. She has a sickly appearance. No distress.  HENT:  Head: Normocephalic and atraumatic.  Right Ear: External ear normal.  Left Ear: External ear normal.  Eyes: Conjunctivae are normal. No scleral icterus.  Neck: Normal range  of motion. Neck supple. No tracheal deviation present.  Cardiovascular: Normal heart sounds.   Respiratory: Effort normal and breath sounds normal. No stridor. No respiratory distress. She has no wheezes.  GI: Soft. She exhibits distension. There is tenderness in the right upper quadrant and epigastric area. There is no rigidity, no rebound and no guarding.    A little distended  Musculoskeletal: She exhibits no edema or tenderness.  Neurological: She appears lethargic. GCS eye subscore is 4. GCS verbal subscore is 5. GCS motor subscore is 6.  Skin: Skin is warm and dry. No rash noted. No erythema.    Assessment/Plan: Acute pancreatitis Cholelithiasis Mildly elevated transaminases Duodenal diverticulum ARF on CKD  She does not have cholecystitis based on ct findings therefore doesn't need coverage for GB. Normally would not recommend abx coverage for pancreatitis, but given the degree of severe peripancreatic inflammation, very elevated wbc, would consider abx.   Aggressive IVF resuscitation Bowel rest Timing of cholecystectomy will depend on she responds/pancreatitis  Daily labs  Leighton Ruff. Redmond Pulling, MD, FACS General, Bariatric, & Minimally Invasive Surgery [facility], [place]    [facility] M 04/28/2015, 8:01 PM

## 2015-04-29 DIAGNOSIS — N179 Acute kidney failure, unspecified: Secondary | ICD-10-CM

## 2015-04-29 DIAGNOSIS — K8020 Calculus of gallbladder without cholecystitis without obstruction: Secondary | ICD-10-CM

## 2015-04-29 DIAGNOSIS — K851 Biliary acute pancreatitis without necrosis or infection: Secondary | ICD-10-CM

## 2015-04-29 LAB — COMPREHENSIVE METABOLIC PANEL
ALK PHOS: 65 U/L (ref 38–126)
ALT: 66 U/L — AB (ref 14–54)
AST: 94 U/L — AB (ref 15–41)
Albumin: 3.2 g/dL — ABNORMAL LOW (ref 3.5–5.0)
Anion gap: 13 (ref 5–15)
BILIRUBIN TOTAL: 1.1 mg/dL (ref 0.3–1.2)
BUN: 39 mg/dL — AB (ref 6–20)
CHLORIDE: 118 mmol/L — AB (ref 101–111)
CO2: 16 mmol/L — ABNORMAL LOW (ref 22–32)
CREATININE: 2.25 mg/dL — AB (ref 0.44–1.00)
Calcium: 8.5 mg/dL — ABNORMAL LOW (ref 8.9–10.3)
GFR calc Af Amer: 25 mL/min — ABNORMAL LOW (ref >60)
GFR, EST NON AFRICAN AMERICAN: 22 mL/min — AB (ref >60)
Glucose, Bld: 300 mg/dL — ABNORMAL HIGH (ref 65–99)
Potassium: 4.1 mmol/L (ref 3.5–5.1)
SODIUM: 147 mmol/L — AB (ref 135–145)
Total Protein: 6.3 g/dL — ABNORMAL LOW (ref 6.5–8.1)

## 2015-04-29 LAB — BASIC METABOLIC PANEL
Anion gap: 12 (ref 5–15)
BUN: 48 mg/dL — AB (ref 6–20)
CALCIUM: 7.8 mg/dL — AB (ref 8.9–10.3)
CHLORIDE: 119 mmol/L — AB (ref 101–111)
CO2: 18 mmol/L — ABNORMAL LOW (ref 22–32)
CREATININE: 2.84 mg/dL — AB (ref 0.44–1.00)
GFR, EST AFRICAN AMERICAN: 19 mL/min — AB (ref >60)
GFR, EST NON AFRICAN AMERICAN: 16 mL/min — AB (ref >60)
Glucose, Bld: 192 mg/dL — ABNORMAL HIGH (ref 65–99)
Potassium: 4 mmol/L (ref 3.5–5.1)
SODIUM: 149 mmol/L — AB (ref 135–145)

## 2015-04-29 LAB — URINE MICROSCOPIC-ADD ON: RBC / HPF: NONE SEEN RBC/hpf (ref 0–5)

## 2015-04-29 LAB — CBC
HCT: 41.1 % (ref 36.0–46.0)
Hemoglobin: 14 g/dL (ref 12.0–15.0)
MCH: 28.9 pg (ref 26.0–34.0)
MCHC: 34.1 g/dL (ref 30.0–36.0)
MCV: 84.9 fL (ref 78.0–100.0)
PLATELETS: 335 10*3/uL (ref 150–400)
RBC: 4.84 MIL/uL (ref 3.87–5.11)
RDW: 13.4 % (ref 11.5–15.5)
WBC: 16.3 10*3/uL — AB (ref 4.0–10.5)

## 2015-04-29 LAB — URINALYSIS, ROUTINE W REFLEX MICROSCOPIC
BILIRUBIN URINE: NEGATIVE
Glucose, UA: 500 mg/dL — AB
KETONES UR: NEGATIVE mg/dL
NITRITE: POSITIVE — AB
Protein, ur: 30 mg/dL — AB
SPECIFIC GRAVITY, URINE: 1.013 (ref 1.005–1.030)
pH: 5.5 (ref 5.0–8.0)

## 2015-04-29 LAB — GLUCOSE, CAPILLARY
GLUCOSE-CAPILLARY: 266 mg/dL — AB (ref 65–99)
GLUCOSE-CAPILLARY: 209 mg/dL — AB (ref 65–99)
GLUCOSE-CAPILLARY: 128 mg/dL — AB (ref 65–99)
GLUCOSE-CAPILLARY: 108 mg/dL — AB (ref 65–99)
Glucose-Capillary: 253 mg/dL — ABNORMAL HIGH (ref 65–99)
Glucose-Capillary: 274 mg/dL — ABNORMAL HIGH (ref 65–99)

## 2015-04-29 LAB — LIPASE, BLOOD: LIPASE: 2825 U/L — AB (ref 11–51)

## 2015-04-29 MED ORDER — SODIUM BICARBONATE 8.4 % IV SOLN
INTRAVENOUS | Status: AC
  Filled 2015-04-29: qty 50

## 2015-04-29 MED ORDER — LACTATED RINGERS IV SOLN
INTRAVENOUS | Status: DC
  Administered 2015-04-29: 125 mL/h via INTRAVENOUS
  Administered 2015-04-30: 1000 mL via INTRAVENOUS
  Administered 2015-04-30 (×2): via INTRAVENOUS
  Administered 2015-05-01: 1000 mL via INTRAVENOUS

## 2015-04-29 MED ORDER — SODIUM CHLORIDE 0.45 % IV SOLN
INTRAVENOUS | Status: DC
  Administered 2015-04-29: 100 mL/h via INTRAVENOUS

## 2015-04-29 MED ORDER — SODIUM CHLORIDE 0.45 % IV BOLUS
500.0000 mL | Freq: Once | INTRAVENOUS | Status: AC
  Administered 2015-04-29: 500 mL via INTRAVENOUS

## 2015-04-29 MED ORDER — LACTATED RINGERS IV BOLUS (SEPSIS)
500.0000 mL | Freq: Once | INTRAVENOUS | Status: AC
  Administered 2015-04-29: 500 mL via INTRAVENOUS

## 2015-04-29 MED ORDER — HYDROMORPHONE HCL 1 MG/ML IJ SOLN
0.5000 mg | INTRAMUSCULAR | Status: DC | PRN
  Administered 2015-04-29: 1 mg via INTRAVENOUS
  Filled 2015-04-29: qty 1

## 2015-04-29 MED ORDER — INSULIN GLARGINE 100 UNIT/ML ~~LOC~~ SOLN
20.0000 [IU] | Freq: Every day | SUBCUTANEOUS | Status: DC
  Administered 2015-04-29: 20 [IU] via SUBCUTANEOUS
  Filled 2015-04-29: qty 0.2

## 2015-04-29 MED ORDER — VITAMINS A & D EX OINT
TOPICAL_OINTMENT | CUTANEOUS | Status: AC
  Administered 2015-04-29: 1
  Filled 2015-04-29: qty 5

## 2015-04-29 MED ORDER — SODIUM CHLORIDE 0.9 % IV SOLN: INTRAVENOUS | Status: DC

## 2015-04-29 MED ORDER — HYDROMORPHONE HCL 1 MG/ML IJ SOLN
0.5000 mg | INTRAMUSCULAR | Status: DC | PRN
  Administered 2015-04-29 – 2015-04-30 (×5): 1 mg via INTRAVENOUS
  Administered 2015-04-30 (×2): 0.5 mg via INTRAVENOUS
  Administered 2015-04-30 – 2015-05-01 (×3): 1 mg via INTRAVENOUS
  Filled 2015-04-29 (×11): qty 1

## 2015-04-29 NOTE — Progress Notes (Signed)
eLink Physician-Brief Progress Note Patient Name: Ave Filternn C Markson DOB: Nov 02, 1950 MRN: [id number]   Date of Service  04/29/2015  HPI/Events of Note  RN notified of borderline hypotension & tachycardia.  eICU Interventions  1. EKG 2. LR 500cc bolus IV     Intervention Category Intermediate Interventions: Hypotension - evaluation and management  Lawanda CousinsJennings Keiyon Plack 04/29/2015, 7:35 PM

## 2015-04-29 NOTE — Progress Notes (Signed)
Subjective: Feels a little better. No vomiting. Wants water. Still with abd pain  Objective: Vital signs in last 24 hours: Temp:  [97.8 F (36.6 C)-98.2 F (36.8 C)] 97.8 F (36.6 C) (01/02 0800) Pulse Rate:  [81-134] 134 (01/02 1034) Resp:  [16-35] 28 (01/02 1034) BP: (88-163)/(55-100) 93/70 mmHg (01/02 1034) SpO2:  [98 %-100 %] 100 % (01/02 1034) Weight:  [60.6 kg (133 lb 9.6 oz)] 60.6 kg (133 lb 9.6 oz) (01/01 1750) Last BM Date: 04/28/15  Intake/Output from previous day: 01/01 0701 - 01/02 0700 In: 1347 [I.V.:1147; IV Piggyback:200] Out: 225 [Urine:225] Intake/Output this shift: Total I/O In: 230 [I.V.:130; IV Piggyback:100] Out: 375 [Urine:375]  Ill appearing but nontoxic Soft, bloated, TTP in upper abd. No rt/peritonitis  Lab Results:   Recent Labs  04/28/15 1225 04/29/15 0350  WBC 33.2* 16.3*  HGB 13.9 14.0  HCT 40.8 41.1  PLT 460* 335   BMET  Recent Labs  04/28/15 1820 04/29/15 0350  NA 141 147*  K 5.6* 4.1  CL 117* 118*  CO2 13* 16*  GLUCOSE 362* 300*  BUN 37* 39*  CREATININE 2.13* 2.25*  CALCIUM 9.2 8.5*   Hepatic Function Latest Ref Rng 04/29/2015 04/28/2015 05/17/2009  Total Protein 6.5 - 8.1 g/dL 6.3(L) 7.3 7.2  Albumin 3.5 - 5.0 g/dL 3.2(L) 4.2 4.2  AST 15 - 41 U/L 94(H) 124(H) 20  ALT 14 - 54 U/L 66(H) 67(H) 40(H)  Alk Phosphatase 38 - 126 U/L 65 78 156(H)  Total Bilirubin 0.3 - 1.2 mg/dL 1.1 1.0 0.4     PT/INR  Recent Labs  04/28/15 1820  LABPROT 13.8  INR 1.04   ABG No results for input(s): PHART, HCO3 in the last 72 hours.  Invalid input(s): PCO2, PO2  Studies/Results: Ct Abdomen Pelvis Wo Contrast  04/28/2015  CLINICAL DATA:  Diarrhea and abdominal cramping for 3 hours. Blood glucose 305. Abdominal pain. EXAM: CT ABDOMEN AND PELVIS WITHOUT CONTRAST TECHNIQUE: Multidetector CT imaging of the abdomen and pelvis was performed following the standard protocol without IV contrast. COMPARISON:  Abdominal ultrasound of  05/10/2014.  No prior CT. FINDINGS: Lower chest: Clear lung bases. Normal heart size without pericardial or pleural effusion. A tiny hiatal hernia. Hepatobiliary: Normal noncontrast appearance of the liver. Gallstones. No pericholecystic edema. No choledocholithiasis or biliary duct dilatation. Pancreas: Enlargement of the pancreatic neck, head, and uncinate process. Peripancreatic edema, without well-defined collection. No pancreatic duct dilatation. Spleen: Normal in size, without focal abnormality. Adrenals/Urinary Tract: Normal adrenal glands. Mild left and moderate right renal cortical thinning. No hydronephrosis. No hydroureter or ureteric calculi. No bladder calculi. Stomach/Bowel: Normal remainder of the stomach. Normal colon and terminal ileum. The appendix is mildly prominent, measuring up to 8 mm on image 58/ series 2. No periappendiceal inflammation. Favored to be within normal variation. Gas and soft tissue density cephalad the transverse duodenum on image 36/ series 2 is favored to be related to a duodenal diverticulum. No edema posterior to this area. Small bowel otherwise unremarkable. Vascular/Lymphatic: Aortic and branch vessel atherosclerosis. No abdominopelvic adenopathy. Reproductive: Normal uterus and adnexa. Other: No free pelvic fluid. small volume perihepatic ascites. Mild pelvic floor laxity. Musculoskeletal: Prominent disc bulge at L4-5. IMPRESSION: 1. Upper abdominal edema, eccentric right. Centered about the pancreatic head, uncinate process, and neck. Favor pancreatitis. Correlate with pancreatic enzymes (pending). If these are abnormal, less likely differential consideration would include peptic ulcer disease. 2. Cholelithiasis without specific evidence of cholecystitis or biliary duct dilatation. 3. Trace perihepatic ascites, likely  secondary. 4. Small hiatal hernia. 5. Pelvic floor laxity. 6. Renal atrophy. Electronically Signed   By: Abigail Miyamoto M.D.   On: 04/28/2015 14:49   US  Abdomen Limited Ruq  04/28/2015  CLINICAL DATA:  Elevated LFTs. Acute pancreatitis noted on CT, with concern for underlying gallstones. Initial encounter. EXAM: US ABDOMEN LIMITED - RIGHT UPPER QUADRANT COMPARISON:  CT of the abdomen and pelvis performed earlier today at 2:28 p.m. FINDINGS: Gallbladder: Gallbladder wall thickening is noted, with mild pericholecystic fluid. This is nonspecific in the presence of the patient's known pancreatitis. Multiple stones are seen at the gallbladder fundus, measuring up to 5 mm, and a 5 mm stone is seen lodged at the neck of the gallbladder. No ultrasonographic Murphy's sign is elicited. Common bile duct: Diameter: 0.4 cm, within normal limits in caliber. No obstructing stone is seen. Liver: No focal lesion identified. Within normal limits in parenchymal echogenicity. Assess trace ascites about the liver reflects the patient's known pancreatitis. IMPRESSION: 1. Gallbladder wall thickening, with mild pericholecystic fluid. This is nonspecific in the presence of the patient's known pancreatitis. Stones noted within the gallbladder, with a 5 mm stone lodged at the neck of the gallbladder. Mild acute cholecystitis cannot be entirely excluded, but is considered less likely. 2. No evidence for bile duct obstruction. The patient's apparent duodenal diverticulum is not well characterized on ultrasound. 3. Trace ascites about the liver reflects the patient's known pancreatitis. Electronically Signed   By: Garald Balding M.D.   On: 04/28/2015 21:56    Anti-infectives: Anti-infectives    Start     Dose/Rate Route Frequency Ordered Stop   04/29/15 0200  imipenem-cilastatin (PRIMAXIN) 250 mg in sodium chloride 0.9 % 100 mL IVPB     250 mg 200 mL/hr over 30 Minutes Intravenous Every 8 hours 04/28/15 1812     04/28/15 1830  imipenem-cilastatin (PRIMAXIN) 250 mg in sodium chloride 0.9 % 100 mL IVPB     250 mg 200 mL/hr over 30 Minutes Intravenous  Once 04/28/15 1812 04/28/15 1904       Assessment/Plan: Severe acute pancreatitis Gallstones ARF DM2   Transaminases trending down Still with significant pancreatitis. Agree with increased IVF.  No role for cholecystectomy right now Will follow May have limited sips of water  Leighton Ruff. Redmond Pulling, MD, FACS General, Bariatric, & Minimally Invasive Surgery [facility], [place]    LOS: 1 day    Gayland Curry 04/29/2015

## 2015-04-29 NOTE — Consult Note (Signed)
Urology Consult  Referring physician: Dr. Krishinan  Reason for referral: difficult foley placement  Chief Complaint: abdominal pain  History of Present Illness: Victoria Morrow is a 64yo with no significant GU hx who is admitted with acute pancreatitis and acute renal failure. She has had marginal urine output and multiple attempts were made to place a foley which were unsuccessful. Currently the patient complains of severe suprapubic pain and an inability to urinate. Prior to admission she had significant frequency, urgency with urge incontinence and a feeling of incomplete emptying.   Past Medical History  Diagnosis Date  . Olivopontocerebellar atrophy (HCC)     Mgmt Dr Dohmeier  . Diabetes mellitus     Type II. Insulin dependent. Diabetic nephropathy.   History reviewed. No pertinent past surgical history.  Medications: I have reviewed the patient's current medications. Allergies: No Known Allergies  Family History  Problem Relation Age of Onset  . Kidney disease Mother   . Heart disease Father    Social History:  reports that she has never smoked. She does not have any smokeless tobacco history on file. She reports that she does not drink alcohol or use illicit drugs.  Review of Systems  Constitutional: Positive for malaise/fatigue.  Gastrointestinal: Positive for nausea and abdominal pain.  Genitourinary: Positive for dysuria, urgency and frequency.  Neurological: Positive for weakness.  All other systems reviewed and are negative.   Physical Exam:  Vital signs in last 24 hours: Temp:  [97.8 F (36.6 C)-98.2 F (36.8 C)] 97.8 F (36.6 C) (01/02 1200) Pulse Rate:  [106-134] 120 (01/02 1302) Resp:  [23-35] 31 (01/02 1302) BP: (83-163)/(56-100) 89/63 mmHg (01/02 1302) SpO2:  [97 %-100 %] 97 % (01/02 1302) Weight:  [60.6 kg (133 lb 9.6 oz)] 60.6 kg (133 lb 9.6 oz) (01/01 1750) Physical Exam  Constitutional: She is oriented to person, place, and time. She appears  well-developed and well-nourished.  HENT:  Head: Normocephalic and atraumatic.  Eyes: EOM are normal. Pupils are equal, round, and reactive to light.  Neck: Normal range of motion. No thyromegaly present.  Cardiovascular: Normal rate and regular rhythm.   Respiratory: Effort normal. No respiratory distress.  GI: Soft. She exhibits no distension and no mass. There is tenderness. There is no rebound and no guarding. Hernia confirmed negative in the right inguinal area and confirmed negative in the left inguinal area.  Genitourinary: Pelvic exam was performed with patient supine. There is no rash, tenderness, lesion or injury on the right labia. There is no rash, tenderness, lesion or injury on the left labia. No bleeding in the vagina. No vaginal discharge found.  Grade 2 cystocele. Urethral stenosis  Musculoskeletal: Normal range of motion.  Neurological: She is alert and oriented to person, place, and time.  Skin: Skin is warm and dry.  Psychiatric: She has a normal mood and affect. Her behavior is normal. Judgment and thought content normal.    Laboratory Data:  Results for orders placed or performed during the hospital encounter of 04/28/15 (from the past 72 hour(s))  Comprehensive metabolic panel     Status: Abnormal   Collection Time: 04/28/15 12:25 PM  Result Value Ref Range   Sodium 140 135 - 145 mmol/Morrow   Potassium 4.2 3.5 - 5.1 mmol/Morrow   Chloride 109 101 - 111 mmol/Morrow   CO2 17 (Morrow) 22 - 32 mmol/Morrow   Glucose, Bld 356 (H) 65 - 99 mg/dL   BUN 31 (H) 6 - 20 mg/dL   Creatinine, Ser   2.08 (H) 0.44 - 1.00 mg/dL   Calcium 10.6 (H) 8.9 - 10.3 mg/dL   Total Protein 7.3 6.5 - 8.1 g/dL   Albumin 4.2 3.5 - 5.0 g/dL   AST 124 (H) 15 - 41 U/Morrow   ALT 67 (H) 14 - 54 U/Morrow   Alkaline Phosphatase 78 38 - 126 U/Morrow   Total Bilirubin 1.0 0.3 - 1.2 mg/dL   GFR calc non Af Amer 24 (Morrow) >60 mL/min   GFR calc Af Amer 27 (Morrow) >60 mL/min    Comment: (NOTE) The eGFR has been calculated using the CKD EPI  equation. This calculation has not been validated in all clinical situations. eGFR's persistently <60 mL/min signify possible Chronic Kidney Disease.    Anion gap 14 5 - 15  CBC     Status: Abnormal   Collection Time: 04/28/15 12:25 PM  Result Value Ref Range   WBC 33.2 (H) 4.0 - 10.5 K/uL   RBC 4.69 3.87 - 5.11 MIL/uL   Hemoglobin 13.9 12.0 - 15.0 g/dL   HCT 40.8 36.0 - 46.0 %   MCV 87.0 78.0 - 100.0 fL   MCH 29.6 26.0 - 34.0 pg   MCHC 34.1 30.0 - 36.0 g/dL   RDW 13.6 11.5 - 15.5 %   Platelets 460 (H) 150 - 400 K/uL  I-Stat CG4 Lactic Acid, ED     Status: Abnormal   Collection Time: 04/28/15  1:17 PM  Result Value Ref Range   Lactic Acid, Venous 4.20 (HH) 0.5 - 2.0 mmol/Morrow   Comment NOTIFIED PHYSICIAN   Blood culture (routine x 2)     Status: None (Preliminary result)   Collection Time: 04/28/15  1:50 PM  Result Value Ref Range   Specimen Description BLOOD BLOOD LEFT ARM    Special Requests IN PEDIATRIC BOTTLE 1CC    Culture      NO GROWTH < 24 HOURS Performed at [facility]    Report Status PENDING   Lipase, blood     Status: Abnormal   Collection Time: 04/28/15  1:50 PM  Result Value Ref Range   Lipase >3000 (H) 11 - 51 U/Morrow    Comment: RESULT CALLED TO, READ BACK BY AND VERIFIED WITH: B.[email or web address] RN AT 2058 ON 1.1.17 BY W.[email or web address]. RESULTS CONFIRMED BY MANUAL DILUTION CORRECTED ON 01/01 AT 2057: PREVIOUSLY REPORTED AS <3000 RESULTS CONFIRMED BY MANUAL DILUTION   Blood culture (routine x 2)     Status: None (Preliminary result)   Collection Time: 04/28/15  1:55 PM  Result Value Ref Range   Specimen Description BLOOD BLOOD RIGHT HAND    Special Requests IN PEDIATRIC BOTTLE 1CC    Culture      NO GROWTH < 24 HOURS Performed at [facility]    Report Status PENDING   I-Stat CG4 Lactic Acid, ED     Status: None   Collection Time: 04/28/15  5:05 PM  Result Value Ref Range   Lactic Acid, Venous 1.74 0.5 - 2.0 mmol/Morrow  MRSA PCR Screening     Status: None    Collection Time: 04/28/15  5:59 PM  Result Value Ref Range   MRSA by PCR NEGATIVE NEGATIVE    Comment:        The GeneXpert MRSA Assay (FDA approved for NASAL specimens only), is one component of a comprehensive MRSA colonization surveillance program. It is not intended to diagnose MRSA infection nor to guide or monitor treatment for MRSA infections.   Triglycerides  Status: None   Collection Time: 04/28/15  6:20 PM  Result Value Ref Range   Triglycerides 104 <150 mg/dL    Comment: Performed at [facility]  Procalcitonin     Status: None   Collection Time: 04/28/15  6:20 PM  Result Value Ref Range   Procalcitonin 11.43 ng/mL    Comment:        Interpretation: PCT >= 10 ng/mL: Important systemic inflammatory response, almost exclusively due to severe bacterial sepsis or septic shock. (NOTE)         ICU PCT Algorithm               Non ICU PCT Algorithm    ----------------------------     ------------------------------         PCT < 0.25 ng/mL                 PCT < 0.1 ng/mL     Stopping of antibiotics            Stopping of antibiotics       strongly encouraged.               strongly encouraged.    ----------------------------     ------------------------------       PCT level decrease by               PCT < 0.25 ng/mL       >= 80% from peak PCT       OR PCT 0.25 - 0.5 ng/mL          Stopping of antibiotics                                             encouraged.     Stopping of antibiotics           encouraged.    ----------------------------     ------------------------------       PCT level decrease by              PCT >= 0.25 ng/mL       < 80% from peak PCT        AND PCT >= 0.5 ng/mL             Continuing antibiotics                                              encouraged.       Continuing antibiotics            encouraged.    ----------------------------     ------------------------------     PCT level increase compared          PCT > 0.5 ng/mL          with peak PCT AND          PCT >= 0.5 ng/mL             Escalation of antibiotics                                          strongly encouraged.      Escalation of antibiotics          strongly encouraged.   Protime-INR     Status: None   Collection Time: 04/28/15  6:20 PM  Result Value Ref Range   Prothrombin Time 13.8 11.6 - 15.2 seconds   INR 1.04 0.00 - 1.49  APTT     Status: None   Collection Time: 04/28/15  6:20 PM  Result Value Ref Range   aPTT 27 24 - 37 seconds  Lactic acid, plasma     Status: None   Collection Time: 04/28/15  6:20 PM  Result Value Ref Range   Lactic Acid, Venous 2.0 0.5 - 2.0 mmol/Morrow  Basic metabolic panel     Status: Abnormal   Collection Time: 04/28/15  6:20 PM  Result Value Ref Range   Sodium 141 135 - 145 mmol/Morrow   Potassium 5.6 (H) 3.5 - 5.1 mmol/Morrow    Comment: DELTA CHECK NOTED NO VISIBLE HEMOLYSIS    Chloride 117 (H) 101 - 111 mmol/Morrow   CO2 13 (Morrow) 22 - 32 mmol/Morrow   Glucose, Bld 362 (H) 65 - 99 mg/dL   BUN 37 (H) 6 - 20 mg/dL   Creatinine, Ser 2.13 (H) 0.44 - 1.00 mg/dL   Calcium 9.2 8.9 - 10.3 mg/dL   GFR calc non Af Amer 23 (Morrow) >60 mL/min   GFR calc Af Amer 27 (Morrow) >60 mL/min    Comment: (NOTE) The eGFR has been calculated using the CKD EPI equation. This calculation has not been validated in all clinical situations. eGFR's persistently <60 mL/min signify possible Chronic Kidney Disease.    Anion gap 11 5 - 15  Glucose, capillary     Status: Abnormal   Collection Time: 04/28/15  7:45 PM  Result Value Ref Range   Glucose-Capillary 297 (H) 65 - 99 mg/dL  Lactic acid, plasma     Status: None   Collection Time: 04/28/15  9:23 PM  Result Value Ref Range   Lactic Acid, Venous 1.9 0.5 - 2.0 mmol/Morrow  Glucose, capillary     Status: Abnormal   Collection Time: 04/28/15  9:52 PM  Result Value Ref Range   Glucose-Capillary 294 (H) 65 - 99 mg/dL  Urinalysis, Routine w reflex microscopic (not at [facility])     Status: Abnormal   Collection Time:  04/29/15 12:05 AM  Result Value Ref Range   Color, Urine YELLOW YELLOW   APPearance CLOUDY (A) CLEAR   Specific Gravity, Urine 1.013 1.005 - 1.030   pH 5.5 5.0 - 8.0   Glucose, UA 500 (A) NEGATIVE mg/dL   Hgb urine dipstick TRACE (A) NEGATIVE   Bilirubin Urine NEGATIVE NEGATIVE   Ketones, ur NEGATIVE NEGATIVE mg/dL   Protein, ur 30 (A) NEGATIVE mg/dL   Nitrite POSITIVE (A) NEGATIVE   Leukocytes, UA MODERATE (A) NEGATIVE  Urine microscopic-add on     Status: Abnormal   Collection Time: 04/29/15 12:05 AM  Result Value Ref Range   Squamous Epithelial / LPF 6-30 (A) NONE SEEN   WBC, UA 6-30 0 - 5 WBC/hpf   RBC / HPF NONE SEEN 0 - 5 RBC/hpf   Bacteria, UA MANY (A) NONE SEEN   Casts HYALINE CASTS (A) NEGATIVE  Glucose, capillary     Status: Abnormal   Collection Time: 04/29/15 12:27 AM  Result Value Ref Range   Glucose-Capillary 274 (H) 65 - 99 mg/dL  Lipase, blood     Status: Abnormal   Collection Time: 04/29/15  3:50 AM  Result Value Ref Range   Lipase 2825 (H) 11 - 51  U/Morrow    Comment: RESULTS CONFIRMED BY MANUAL DILUTION  Comprehensive metabolic panel     Status: Abnormal   Collection Time: 04/29/15  3:50 AM  Result Value Ref Range   Sodium 147 (H) 135 - 145 mmol/Morrow   Potassium 4.1 3.5 - 5.1 mmol/Morrow    Comment: DELTA CHECK NOTED REPEATED TO VERIFY    Chloride 118 (H) 101 - 111 mmol/Morrow   CO2 16 (Morrow) 22 - 32 mmol/Morrow   Glucose, Bld 300 (H) 65 - 99 mg/dL   BUN 39 (H) 6 - 20 mg/dL   Creatinine, Ser 2.25 (H) 0.44 - 1.00 mg/dL   Calcium 8.5 (Morrow) 8.9 - 10.3 mg/dL   Total Protein 6.3 (Morrow) 6.5 - 8.1 g/dL   Albumin 3.2 (Morrow) 3.5 - 5.0 g/dL   AST 94 (H) 15 - 41 U/Morrow   ALT 66 (H) 14 - 54 U/Morrow   Alkaline Phosphatase 65 38 - 126 U/Morrow   Total Bilirubin 1.1 0.3 - 1.2 mg/dL   GFR calc non Af Amer 22 (Morrow) >60 mL/min   GFR calc Af Amer 25 (Morrow) >60 mL/min    Comment: (NOTE) The eGFR has been calculated using the CKD EPI equation. This calculation has not been validated in all clinical  situations. eGFR's persistently <60 mL/min signify possible Chronic Kidney Disease.    Anion gap 13 5 - 15  CBC     Status: Abnormal   Collection Time: 04/29/15  3:50 AM  Result Value Ref Range   WBC 16.3 (H) 4.0 - 10.5 K/uL   RBC 4.84 3.87 - 5.11 MIL/uL   Hemoglobin 14.0 12.0 - 15.0 g/dL   HCT 41.1 36.0 - 46.0 %   MCV 84.9 78.0 - 100.0 fL   MCH 28.9 26.0 - 34.0 pg   MCHC 34.1 30.0 - 36.0 g/dL   RDW 13.4 11.5 - 15.5 %   Platelets 335 150 - 400 K/uL  Glucose, capillary     Status: Abnormal   Collection Time: 04/29/15  4:23 AM  Result Value Ref Range   Glucose-Capillary 253 (H) 65 - 99 mg/dL  Glucose, capillary     Status: Abnormal   Collection Time: 04/29/15  8:03 AM  Result Value Ref Range   Glucose-Capillary 266 (H) 65 - 99 mg/dL   Comment 1 Notify RN    Comment 2 Document in Chart   Glucose, capillary     Status: Abnormal   Collection Time: 04/29/15 12:09 PM  Result Value Ref Range   Glucose-Capillary 209 (H) 65 - 99 mg/dL   Comment 1 Notify RN    Comment 2 Document in Chart   Basic metabolic panel     Status: Abnormal   Collection Time: 04/29/15  2:56 PM  Result Value Ref Range   Sodium 149 (H) 135 - 145 mmol/Morrow   Potassium 4.0 3.5 - 5.1 mmol/Morrow   Chloride 119 (H) 101 - 111 mmol/Morrow   CO2 18 (Morrow) 22 - 32 mmol/Morrow   Glucose, Bld 192 (H) 65 - 99 mg/dL   BUN 48 (H) 6 - 20 mg/dL   Creatinine, Ser 2.84 (H) 0.44 - 1.00 mg/dL   Calcium 7.8 (Morrow) 8.9 - 10.3 mg/dL   GFR calc non Af Amer 16 (Morrow) >60 mL/min   GFR calc Af Amer 19 (Morrow) >60 mL/min    Comment: (NOTE) The eGFR has been calculated using the CKD EPI equation. This calculation has not been validated in all clinical situations. eGFR's persistently <60 mL/min signify   possible Chronic Kidney Disease.    Anion gap 12 5 - 15   Recent Results (from the past 240 hour(s))  Blood culture (routine x 2)     Status: None (Preliminary result)   Collection Time: 04/28/15  1:50 PM  Result Value Ref Range Status   Specimen Description  BLOOD BLOOD LEFT ARM  Final   Special Requests IN PEDIATRIC BOTTLE [place]  Final   Culture   Final    NO GROWTH < 24 HOURS Performed at [facility]    Report Status PENDING  Incomplete  Blood culture (routine x 2)     Status: None (Preliminary result)   Collection Time: 04/28/15  1:55 PM  Result Value Ref Range Status   Specimen Description BLOOD BLOOD RIGHT HAND  Final   Special Requests IN PEDIATRIC BOTTLE [place]  Final   Culture   Final    NO GROWTH < 24 HOURS Performed at [facility]    Report Status PENDING  Incomplete  MRSA PCR Screening     Status: None   Collection Time: 04/28/15  5:59 PM  Result Value Ref Range Status   MRSA by PCR NEGATIVE NEGATIVE Final    Comment:        The GeneXpert MRSA Assay (FDA approved for NASAL specimens only), is one component of a comprehensive MRSA colonization surveillance program. It is not intended to diagnose MRSA infection nor to guide or monitor treatment for MRSA infections.    Creatinine:  Recent Labs  04/28/15 1225 04/28/15 1820 04/29/15 0350 04/29/15 1456  CREATININE 2.08* 2.13* 2.25* 2.84*   Baseline Creatinine: unknown  Impression/Assessment:  64yo with acute pancreatitis, ARF, urinary retention, and urethral stenosis  Plan:  1. 16 french foley placed without complication and 938BO of dark urine drained. Foley should continue for 5 days prior to voiding trial Urology signing off. Please reconsult if necessary  Victoria Morrow 04/29/2015, 4:30 PM

## 2015-04-29 NOTE — Progress Notes (Signed)
[facility] PROGRESS NOTE  Ave [address] Vonruden JYN:[id number] DOB: [birth year] DOA: 04/28/2015  PCP: Maryelizabeth RowanEWEY,ELIZABETH, MD  Brief HPI: 65 year old African-American female with a past medical history of type 2 diabetes on insulin, chronic kidney disease of unknown stage, presented with abdominal pain, nausea and vomiting. She was found to have severe pancreatitis thought to be biliary in origin. She was hospitalized for further management.  Past medical history:  Past Medical History  Diagnosis Date  . Olivopontocerebellar atrophy (HCC)     Mgmt Dr Vickey Hugerohmeier  . Diabetes mellitus     Type II. Insulin dependent. Diabetic nephropathy.    Consultants: Gen. surgery. Critical care medicine. Urology  Procedures: None yet  Antibiotics: Imipenem 1/1  Subjective: Patient continues to have pain in her upper abdomen. Denies any further nausea, vomiting or diarrhea. Patient is not very communicative.  Objective: Vital Signs  Filed Vitals:   04/29/15 0700 04/29/15 0800 04/29/15 0900 04/29/15 1034  BP: 103/67 88/74 101/77 93/70  Pulse: 119 122 132 134  Temp:  97.8 F (36.6 C)    TempSrc:  Oral    Resp: 27 30 30 28   Height:      Weight:      SpO2: 98% 98% 99% 100%    Intake/Output Summary (Last 24 hours) at 04/29/15 1053 Last data filed at 04/29/15 1046  Gross per 24 hour  Intake   1577 ml  Output    600 ml  Net    977 ml   Filed Weights   04/28/15 1750  Weight: 60.6 kg (133 lb 9.6 oz)    General appearance: alert, cooperative, appears stated age, no distress and Flat affect Resp: Diminished air entry at the bases. Tachypnea. No definite crackles or wheezing. No rhonchi. Cardio: S1, S2 is tachycardic. Regular. No S3, S4. No rubs, murmurs, or bruit. No pedal edema. GI: Soft. Tender in the epigastric area without any rebound, rigidity. Mild guarding. No masses or organomegaly. Bowel sounds are sluggish Extremities: extremities normal, atraumatic, no cyanosis or  edema Neurologic: Awake and alert. No focal neurological deficits.  Lab Results:  Basic Metabolic Panel:  Recent Labs Lab 04/28/15 1225 04/28/15 1820 04/29/15 0350  NA 140 141 147*  K 4.2 5.6* 4.1  CL 109 117* 118*  CO2 17* 13* 16*  GLUCOSE 356* 362* 300*  BUN 31* 37* 39*  CREATININE 2.08* 2.13* 2.25*  CALCIUM 10.6* 9.2 8.5*   Liver Function Tests:  Recent Labs Lab 04/28/15 1225 04/29/15 0350  AST 124* 94*  ALT 67* 66*  ALKPHOS 78 65  BILITOT 1.0 1.1  PROT 7.3 6.3*  ALBUMIN 4.2 3.2*    Recent Labs Lab 04/28/15 1350 04/29/15 0350  LIPASE >3000* 2825*   No results for input(s): AMMONIA in the last 168 hours. CBC:  Recent Labs Lab 04/28/15 1225 04/29/15 0350  WBC 33.2* 16.3*  HGB 13.9 14.0  HCT 40.8 41.1  MCV 87.0 84.9  PLT 460* 335    CBG:  Recent Labs Lab 04/28/15 1945 04/28/15 2152 04/29/15 0423 04/29/15 0803  GLUCAP 297* 294* 253* 266*    Recent Results (from the past 240 hour(s))  MRSA PCR Screening     Status: None   Collection Time: 04/28/15  5:59 PM  Result Value Ref Range Status   MRSA by PCR NEGATIVE NEGATIVE Final    Comment:        The GeneXpert MRSA Assay (FDA approved for NASAL specimens only), is one component of a comprehensive MRSA colonization surveillance program.  It is not intended to diagnose MRSA infection nor to guide or monitor treatment for MRSA infections.       Studies/Results: Ct Abdomen Pelvis Wo Contrast  04/28/2015  CLINICAL DATA:  Diarrhea and abdominal cramping for 3 hours. Blood glucose 305. Abdominal pain. EXAM: CT ABDOMEN AND PELVIS WITHOUT CONTRAST TECHNIQUE: Multidetector CT imaging of the abdomen and pelvis was performed following the standard protocol without IV contrast. COMPARISON:  Abdominal ultrasound of 05/10/2014.  No prior CT. FINDINGS: Lower chest: Clear lung bases. Normal heart size without pericardial or pleural effusion. A tiny hiatal hernia. Hepatobiliary: Normal noncontrast  appearance of the liver. Gallstones. No pericholecystic edema. No choledocholithiasis or biliary duct dilatation. Pancreas: Enlargement of the pancreatic neck, head, and uncinate process. Peripancreatic edema, without well-defined collection. No pancreatic duct dilatation. Spleen: Normal in size, without focal abnormality. Adrenals/Urinary Tract: Normal adrenal glands. Mild left and moderate right renal cortical thinning. No hydronephrosis. No hydroureter or ureteric calculi. No bladder calculi. Stomach/Bowel: Normal remainder of the stomach. Normal colon and terminal ileum. The appendix is mildly prominent, measuring up to 8 mm on image 58/ series 2. No periappendiceal inflammation. Favored to be within normal variation. Gas and soft tissue density cephalad the transverse duodenum on image 36/ series 2 is favored to be related to a duodenal diverticulum. No edema posterior to this area. Small bowel otherwise unremarkable. Vascular/Lymphatic: Aortic and branch vessel atherosclerosis. No abdominopelvic adenopathy. Reproductive: Normal uterus and adnexa. Other: No free pelvic fluid. small volume perihepatic ascites. Mild pelvic floor laxity. Musculoskeletal: Prominent disc bulge at L4-5. IMPRESSION: 1. Upper abdominal edema, eccentric right. Centered about the pancreatic head, uncinate process, and neck. Favor pancreatitis. Correlate with pancreatic enzymes (pending). If these are abnormal, less likely differential consideration would include peptic ulcer disease. 2. Cholelithiasis without specific evidence of cholecystitis or biliary duct dilatation. 3. Trace perihepatic ascites, likely secondary. 4. Small hiatal hernia. 5. Pelvic floor laxity. 6. Renal atrophy. Electronically Signed   By: Jeronimo Greaves M.D.   On: 04/28/2015 14:49   US Abdomen Limited Ruq  04/28/2015  CLINICAL DATA:  Elevated LFTs. Acute pancreatitis noted on CT, with concern for underlying gallstones. Initial encounter. EXAM: US ABDOMEN LIMITED -  RIGHT UPPER QUADRANT COMPARISON:  CT of the abdomen and pelvis performed earlier today at 2:28 p.m. FINDINGS: Gallbladder: Gallbladder wall thickening is noted, with mild pericholecystic fluid. This is nonspecific in the presence of the patient's known pancreatitis. Multiple stones are seen at the gallbladder fundus, measuring up to 5 mm, and a 5 mm stone is seen lodged at the neck of the gallbladder. No ultrasonographic Murphy's sign is elicited. Common bile duct: Diameter: 0.4 cm, within normal limits in caliber. No obstructing stone is seen. Liver: No focal lesion identified. Within normal limits in parenchymal echogenicity. Assess trace ascites about the liver reflects the patient's known pancreatitis. IMPRESSION: 1. Gallbladder wall thickening, with mild pericholecystic fluid. This is nonspecific in the presence of the patient's known pancreatitis. Stones noted within the gallbladder, with a 5 mm stone lodged at the neck of the gallbladder. Mild acute cholecystitis cannot be entirely excluded, but is considered less likely. 2. No evidence for bile duct obstruction. The patient's apparent duodenal diverticulum is not well characterized on ultrasound. 3. Trace ascites about the liver reflects the patient's known pancreatitis. Electronically Signed   By: Roanna Raider M.D.   On: 04/28/2015 21:56    Medications:  Scheduled: . antiseptic oral rinse  7 mL Mouth Rinse q12n4p  . chlorhexidine  15 mL Mouth Rinse BID  . heparin  5,000 Units Subcutaneous 3 times per day  . imipenem-cilastatin  250 mg Intravenous Q8H  . insulin aspart  0-20 Units Subcutaneous 6 times per day  . insulin glargine  15 Units Subcutaneous QHS  . sodium chloride  3 mL Intravenous Q12H   Continuous: . sodium chloride 100 mL/hr (04/29/15 0842)  . sodium chloride 10 mL/hr at 04/29/15 0023  .  sodium bicarbonate  infusion 1000 mL 50 mL/hr at 04/29/15 0036   ZOX:[id number] **OR** acetaminophen, albuterol, HYDROmorphone  (DILAUDID) injection, ondansetron **OR** ondansetron (ZOFRAN) IV, oxyCODONE  Assessment/Plan:  Principal Problem:   Acute pancreatitis Active Problems:   Gallstones   Transaminitis   DM type 2 (diabetes mellitus, type 2) (HCC)   Hyperlipidemia   CKD (chronic kidney disease)   ARF (acute renal failure) (HCC)    Acute Biliary pancreatitis CT scan also showed gallstones. Patient denies any history of alcohol intake. This is probably gallstone pancreatitis. Ultrasound of the abdomen as above. CBD is normal size. General surgery was consulted by ED physician. No clear evidence for acute cholecystitis. Triglyceride was 104. It appears that the rate of her IV fluid got reduced overnight as she was initiated on bicarbonate infusion. We will give her a fluid bolus and increase the rate this morning. Due to severity of her pancreatitis and significantly elevated WBCshe was started on imipenem which will be continued for now. No necrotic changes in the pancreas noted on CT scan.  Sinus Tachycardia and tachypnea Most likely due to severe inflammation and acute illness. Continue supportive treatment as outlined. We'll also get critical care medicine input.  Metabolic acidosis/Lactic acidosis/possible sepsis Her bicarbonate level dropped overnight. She was started on a bicarbonate infusion with improvement in the levels this morning. Anion gap continues to be normal. Lactic acid level has improved. Ketones were negative. She was experiencing diarrhea as well. Her metabolic acidosis is most likely multifactorial including renal failure, hypovolemia. Continue to monitor closely for now. Pro-calcitonin levels noted to be elevated. Continue imipenem. WBC is improved this morning.  Possible acute on chronic kidney disease Stage is unknown. UA is reviewed. Hasn't had much urine output in the last 12 hours. Some degree of urinary retention is present per her nurse. Foley catheter could not be placed due to some  kind of anatomical deformity. Urology has been consulted. We need strict ins and outs in this patient who is quite sick. Continue IV fluids. If renal function continues to get worse, we may need to involve nephrology. No hydronephrosis noted on CT scan.  Transaminitis Probably related to her acute pancreatitis and gallstones. Ultrasound of the right upper quadrant as above. General surgery is following. Hepatitis panel is pending. Continue to trend LFTs.  Diabetes mellitus type 2, poorly controlled, with chronic kidney disease Continue Lantus. Monitor blood sugars closely. HbA1c is pending. Anion gap is normal.   DVT Prophylaxis: Subcutaneous heparin    Code Status: Full code  Family Communication: Discussed with the patient  Disposition Plan: Continue current treatment as outlined above. Critical care medicine has been consulted. Await urology input as well.     LOS: 1 day   [facility]  [facility] Pager [phone] 04/29/2015, 10:53 AM  If 7PM-7AM, please contact night-coverage at [email or web address], password [email or web address]

## 2015-04-29 NOTE — Progress Notes (Signed)
Utilization review completed.  

## 2015-04-29 NOTE — Consult Note (Signed)
PULMONARY / CRITICAL CARE MEDICINE   Name: Victoria Morrow MRN: [id number] DOB: 19-Jul-1950    ADMISSION DATE:  04/28/2015 CONSULTATION DATE: 1/2  REFERRING MD:  Rito Ehrlich  CHIEF COMPLAINT:  Hypotension and pancreatitis   HISTORY OF PRESENT ILLNESS:   65 year old African-American female with a past medical history of type 2 diabetes on insulin, chronic kidney disease of unknown stage, presented with abdominal pain, nausea and vomiting. She was found to have severe pancreatitis thought to be biliary in origin. She was hospitalized for further management. PCCM asked to see on 1/2 for boarderline hypotension, AKI and metabolic acidosis.   PAST MEDICAL HISTORY :  She  has a past medical history of Olivopontocerebellar atrophy (HCC) and Diabetes mellitus.  PAST SURGICAL HISTORY: She  has no past surgical history on file.  No Known Allergies  No current facility-administered medications on file prior to encounter.   Current Outpatient Prescriptions on File Prior to Encounter  Medication Sig  . aspirin 81 MG tablet Take 81 mg by mouth daily.    . Calcium Carbonate-Vitamin D (OYSTER CALCIUM + D) 500-125 MG-UNIT TABS Take 2 tablets by mouth daily.    . ferrous sulfate 325 (65 FE) MG tablet Take 325 mg by mouth daily with breakfast.   . insulin glargine (LANTUS) 100 UNIT/ML injection Inject 55 Units into the skin at bedtime.     FAMILY HISTORY:  Her indicated that her mother is deceased. She indicated that her father is deceased. She indicated that both of her brothers are alive. She indicated that only one of her two sons is alive.   SOCIAL HISTORY: She  reports that she has never smoked. She does not have any smokeless tobacco history on file. She reports that she does not drink alcohol or use illicit drugs.  REVIEW OF SYSTEMS:   Gen: no fever, + weakness, + pain. HENT: no HA, no visual changes, MMM. Pulm: denies SOB, cough, wheeze or CP. Card: no CP, palps, orthostasis. GU: no freq,  hesitancy, had difficult foley placement which required urology to place Abd: + pain, no nausea currently no vomiting currently MS: generalized weakness. Neuro: no memory loss, HA, dizziness. Endo: no hot cold abnormality, no wt loss or gain.   SUBJECTIVE:  No distress.   VITAL SIGNS: BP 93/70 mmHg  Pulse 134  Temp(Src) 97.8 F (36.6 C) (Oral)  Resp 28  Ht 5\' 2"  (1.575 m)  Wt 60.6 kg (133 lb 9.6 oz)  BMI 24.43 kg/m2  SpO2 100%  HEMODYNAMICS:    VENTILATOR SETTINGS:    INTAKE / OUTPUT: I/O last 3 completed shifts: In: 1347 [I.V.:1147; IV Piggyback:200] Out: 225 [Urine:225]  PHYSICAL EXAMINATION: General:  65 year old female, resting in bed. Not in acute distress but not comfortable  Neuro:  Awake, alert, no focal def  HEENT:  NCAT, no JVD, MMM Cardiovascular:  ST, w/ no MRG Lungs:  Clear, equal, no accessory muscle use  Abdomen:  Distended. Tender to palp. + bowel sounds Musculoskeletal:  Intact, equal strength and bulk Skin:  Warm, dry and intact   LABS:  BMET  Recent Labs Lab 04/28/15 1225 04/28/15 1820 04/29/15 0350  NA 140 141 147*  K 4.2 5.6* 4.1  CL 109 117* 118*  CO2 17* 13* 16*  BUN 31* 37* 39*  CREATININE 2.08* 2.13* 2.25*  GLUCOSE 356* 362* 300*    Electrolytes  Recent Labs Lab 04/28/15 1225 04/28/15 1820 04/29/15 0350  CALCIUM 10.6* 9.2 8.5*    CBC  Recent Labs Lab 04/28/15 1225 04/29/15 0350  WBC 33.2* 16.3*  HGB 13.9 14.0  HCT 40.8 41.1  PLT 460* 335    Coag's  Recent Labs Lab 04/28/15 1820  APTT 27  INR 1.04    Sepsis Markers  Recent Labs Lab 04/28/15 1705 04/28/15 1820 04/28/15 2123  LATICACIDVEN 1.74 2.0 1.9  PROCALCITON  --  11.43  --     ABG No results for input(s): PHART, PCO2ART, PO2ART in the last 168 hours.  Liver Enzymes  Recent Labs Lab 04/28/15 1225 04/29/15 0350  AST 124* 94*  ALT 67* 66*  ALKPHOS 78 65  BILITOT 1.0 1.1  ALBUMIN 4.2 3.2*    Cardiac Enzymes No results for  input(s): TROPONINI, PROBNP in the last 168 hours.  Glucose  Recent Labs Lab 04/28/15 1945 04/28/15 2152 04/29/15 0423 04/29/15 0803  GLUCAP 297* 294* 253* 266*    Imaging Ct Abdomen Pelvis Wo Contrast  04/28/2015  CLINICAL DATA:  Diarrhea and abdominal cramping for 3 hours. Blood glucose 305. Abdominal pain. EXAM: CT ABDOMEN AND PELVIS WITHOUT CONTRAST TECHNIQUE: Multidetector CT imaging of the abdomen and pelvis was performed following the standard protocol without IV contrast. COMPARISON:  Abdominal ultrasound of 05/10/2014.  No prior CT. FINDINGS: Lower chest: Clear lung bases. Normal heart size without pericardial or pleural effusion. A tiny hiatal hernia. Hepatobiliary: Normal noncontrast appearance of the liver. Gallstones. No pericholecystic edema. No choledocholithiasis or biliary duct dilatation. Pancreas: Enlargement of the pancreatic neck, head, and uncinate process. Peripancreatic edema, without well-defined collection. No pancreatic duct dilatation. Spleen: Normal in size, without focal abnormality. Adrenals/Urinary Tract: Normal adrenal glands. Mild left and moderate right renal cortical thinning. No hydronephrosis. No hydroureter or ureteric calculi. No bladder calculi. Stomach/Bowel: Normal remainder of the stomach. Normal colon and terminal ileum. The appendix is mildly prominent, measuring up to 8 mm on image 58/ series 2. No periappendiceal inflammation. Favored to be within normal variation. Gas and soft tissue density cephalad the transverse duodenum on image 36/ series 2 is favored to be related to a duodenal diverticulum. No edema posterior to this area. Small bowel otherwise unremarkable. Vascular/Lymphatic: Aortic and branch vessel atherosclerosis. No abdominopelvic adenopathy. Reproductive: Normal uterus and adnexa. Other: No free pelvic fluid. small volume perihepatic ascites. Mild pelvic floor laxity. Musculoskeletal: Prominent disc bulge at L4-5. IMPRESSION: 1. Upper  abdominal edema, eccentric right. Centered about the pancreatic head, uncinate process, and neck. Favor pancreatitis. Correlate with pancreatic enzymes (pending). If these are abnormal, less likely differential consideration would include peptic ulcer disease. 2. Cholelithiasis without specific evidence of cholecystitis or biliary duct dilatation. 3. Trace perihepatic ascites, likely secondary. 4. Small hiatal hernia. 5. Pelvic floor laxity. 6. Renal atrophy. Electronically Signed   By: Jeronimo Greaves M.D.   On: 04/28/2015 14:49   US Abdomen Limited Ruq  04/28/2015  CLINICAL DATA:  Elevated LFTs. Acute pancreatitis noted on CT, with concern for underlying gallstones. Initial encounter. EXAM: US ABDOMEN LIMITED - RIGHT UPPER QUADRANT COMPARISON:  CT of the abdomen and pelvis performed earlier today at 2:28 p.m. FINDINGS: Gallbladder: Gallbladder wall thickening is noted, with mild pericholecystic fluid. This is nonspecific in the presence of the patient's known pancreatitis. Multiple stones are seen at the gallbladder fundus, measuring up to 5 mm, and a 5 mm stone is seen lodged at the neck of the gallbladder. No ultrasonographic Murphy's sign is elicited. Common bile duct: Diameter: 0.4 cm, within normal limits in caliber. No obstructing stone is seen. Liver: No focal  lesion identified. Within normal limits in parenchymal echogenicity. Assess trace ascites about the liver reflects the patient's known pancreatitis. IMPRESSION: 1. Gallbladder wall thickening, with mild pericholecystic fluid. This is nonspecific in the presence of the patient's known pancreatitis. Stones noted within the gallbladder, with a 5 mm stone lodged at the neck of the gallbladder. Mild acute cholecystitis cannot be entirely excluded, but is considered less likely. 2. No evidence for bile duct obstruction. The patient's apparent duodenal diverticulum is not well characterized on ultrasound. 3. Trace ascites about the liver reflects the  patient's known pancreatitis. Electronically Signed   By: Roanna RaiderJeffery  Chang M.D.   On: 04/28/2015 21:56     STUDIES:  CT abd 1/1 1. Upper abdominal edema, eccentric right. Centered about the pancreatic head, uncinate process, and neck. Favor pancreatitis. Correlate with pancreatic enzymes (pending). If these are abnormal, less likely differential consideration would include peptic ulcer Disease. 2. Cholelithiasis without specific evidence of cholecystitis or biliary duct dilatation. 3. Trace perihepatic ascites, likely secondary. 4. Small hiatal hernia. 5. Pelvic floor laxity. 6. Renal atrophy. US abd 1/1: 1. Gallbladder wall thickening, with mild pericholecystic fluid.This is nonspecific in the presence of the patient's known pancreatitis. Stones noted within the gallbladder, with a 5 mm stone lodged at the neck of the gallbladder. Mild acute cholecystitis cannot be entirely excluded, but is considered less likely. 2. No evidence for bile duct obstruction. The patient's apparent duodenal diverticulum is not well characterized on ultrasound.  CULTURES: BCX2 1/1>>> UC 1/1>>>  ANTIBIOTICS: Imipenem 1/1>>>  SIGNIFICANT EVENTS:   LINES/TUBES:   DISCUSSION: 65 year old female admitted w/ Gallstone pancreatitis. No evidence of acute cholecystitis currently. Lipase is trending down. Has AKI w/ NAGMA-->hoping that this is hitting a plateau. Goal to keep SBP >95, cont IVFs, rest GI tract, f/u am lipase. Timing of cholecystectomy per general surgery.     ASSESSMENT / PLAN:  PULMONARY A: No acute but at risk for sympathetic effusions and atx d/t pancreatitis P:   Aspiration precautions Careful w/ IVFs F/u CXR in am 1/3 IS q hour while awake  CARDIOVASCULAR A:  SIRS in setting of pancreatitis r/o sepsis (LA normal) P:  Cont IVFs SBP goal >95 Tele  RENAL A:   AKI NAGMA Hyperchloremia  Hypernatremia  P:   Dc bicarb gtt, change to LR @ 15925ml/hr I&O Avoid hypotension F/u chemistry  this afternoon   GASTROINTESTINAL A:   Acute gallstone pancreatitis-->lipase trending down. No evidence of cholecystitis at this point  P:   Trend lipase rx pain  NPO/bowel rest w/ sips only Timing of Cholecystectomy per gen surg    HEMATOLOGIC A:   Leukocytosis in setting of SIRS/pancreatitis -->trending down P:  Trend CBC Transfuse per ICU protocol  [place] heparin   INFECTIOUS A:   SIRS in setting of gallstone Pancreatitis -->PCT elevated. Unclear if truly infected. No evidence of pseudocyst at this point but at risk P:   F/u culture data Cont imipenem (see above)  ENDOCRINE A:   Hyperglycemia P:   ICU glycemic control protocol   NEUROLOGIC A:   pain P:   PRN anlagesia   FAMILY  - Updates:   - Inter-disciplinary family meet or Palliative Care meeting due by:  Day 7 Simonne MartinetPeter E Babcock ACNP-BC [facility] Pager # [phone] OR # [phone] if no answer   04/29/2015, 11:35 AM

## 2015-04-30 DIAGNOSIS — Z794 Long term (current) use of insulin: Secondary | ICD-10-CM

## 2015-04-30 DIAGNOSIS — E1122 Type 2 diabetes mellitus with diabetic chronic kidney disease: Secondary | ICD-10-CM

## 2015-04-30 DIAGNOSIS — R6510 Systemic inflammatory response syndrome (SIRS) of non-infectious origin without acute organ dysfunction: Secondary | ICD-10-CM

## 2015-04-30 LAB — CBC
HEMATOCRIT: 34.7 % — AB (ref 36.0–46.0)
HEMOGLOBIN: 11.7 g/dL — AB (ref 12.0–15.0)
MCH: 28.5 pg (ref 26.0–34.0)
MCHC: 33.7 g/dL (ref 30.0–36.0)
MCV: 84.4 fL (ref 78.0–100.0)
Platelets: 265 10*3/uL (ref 150–400)
RBC: 4.11 MIL/uL (ref 3.87–5.11)
RDW: 13.4 % (ref 11.5–15.5)
WBC: 20.6 10*3/uL — AB (ref 4.0–10.5)

## 2015-04-30 LAB — URINALYSIS, ROUTINE W REFLEX MICROSCOPIC
Glucose, UA: NEGATIVE mg/dL
Ketones, ur: NEGATIVE mg/dL
Nitrite: NEGATIVE
Protein, ur: 30 mg/dL — AB
Specific Gravity, Urine: 1.012 (ref 1.005–1.030)
pH: 5.5 (ref 5.0–8.0)

## 2015-04-30 LAB — COMPREHENSIVE METABOLIC PANEL
ALK PHOS: 53 U/L (ref 38–126)
ALT: 45 U/L (ref 14–54)
AST: 77 U/L — AB (ref 15–41)
Albumin: 2.6 g/dL — ABNORMAL LOW (ref 3.5–5.0)
Anion gap: 10 (ref 5–15)
BUN: 45 mg/dL — AB (ref 6–20)
CALCIUM: 7.4 mg/dL — AB (ref 8.9–10.3)
CHLORIDE: 119 mmol/L — AB (ref 101–111)
CO2: 19 mmol/L — ABNORMAL LOW (ref 22–32)
CREATININE: 2.69 mg/dL — AB (ref 0.44–1.00)
GFR, EST AFRICAN AMERICAN: 20 mL/min — AB (ref >60)
GFR, EST NON AFRICAN AMERICAN: 17 mL/min — AB (ref >60)
Glucose, Bld: 98 mg/dL (ref 65–99)
Potassium: 3.3 mmol/L — ABNORMAL LOW (ref 3.5–5.1)
Sodium: 148 mmol/L — ABNORMAL HIGH (ref 135–145)
Total Bilirubin: 1.2 mg/dL (ref 0.3–1.2)
Total Protein: 5.6 g/dL — ABNORMAL LOW (ref 6.5–8.1)

## 2015-04-30 LAB — URINE MICROSCOPIC-ADD ON

## 2015-04-30 LAB — GLUCOSE, CAPILLARY
GLUCOSE-CAPILLARY: 161 mg/dL — AB (ref 65–99)
Glucose-Capillary: 113 mg/dL — ABNORMAL HIGH (ref 65–99)
Glucose-Capillary: 156 mg/dL — ABNORMAL HIGH (ref 65–99)
Glucose-Capillary: 78 mg/dL (ref 65–99)
Glucose-Capillary: 85 mg/dL (ref 65–99)
Glucose-Capillary: 90 mg/dL (ref 65–99)

## 2015-04-30 LAB — HEMOGLOBIN A1C
HEMOGLOBIN A1C: 7.7 % — AB (ref 4.8–5.6)
MEAN PLASMA GLUCOSE: 174 mg/dL

## 2015-04-30 LAB — HEPATITIS PANEL, ACUTE
HCV Ab: <0.1 s/co ratio (ref 0.0–0.9)
Hep A IgM: NEGATIVE
Hep B C IgM: NEGATIVE
Hepatitis B Surface Ag: NEGATIVE

## 2015-04-30 LAB — LIPASE, BLOOD: LIPASE: 2495 U/L — AB (ref 11–51)

## 2015-04-30 MED ORDER — CEFTRIAXONE SODIUM 1 G IJ SOLR: 1.0000 g | INTRAMUSCULAR | Status: DC

## 2015-04-30 MED ORDER — METRONIDAZOLE IN NACL 5-0.79 MG/ML-% IV SOLN
500.0000 mg | Freq: Two times a day (BID) | INTRAVENOUS | Status: DC
  Administered 2015-04-30 – 2015-05-01 (×4): 500 mg via INTRAVENOUS
  Filled 2015-04-30 (×4): qty 100

## 2015-04-30 MED ORDER — DEXTROSE 5 % IV SOLN
1.0000 g | INTRAVENOUS | Status: DC
  Administered 2015-04-30 – 2015-05-01 (×2): 1 g via INTRAVENOUS
  Filled 2015-04-30 (×2): qty 10

## 2015-04-30 MED ORDER — INSULIN GLARGINE 100 UNIT/ML ~~LOC~~ SOLN
10.0000 [IU] | Freq: Every day | SUBCUTANEOUS | Status: DC
  Administered 2015-04-30 – 2015-05-03 (×4): 10 [IU] via SUBCUTANEOUS
  Filled 2015-04-30 (×4): qty 0.1

## 2015-04-30 MED ORDER — POTASSIUM CHLORIDE 10 MEQ/100ML IV SOLN
10.0000 meq | INTRAVENOUS | Status: AC
  Administered 2015-04-30 (×2): 10 meq via INTRAVENOUS
  Filled 2015-04-30 (×2): qty 100

## 2015-04-30 MED ORDER — METOPROLOL TARTRATE 1 MG/ML IV SOLN: 2.5000 mg | INTRAVENOUS | Status: DC | PRN

## 2015-04-30 MED ORDER — LACTATED RINGERS IV BOLUS (SEPSIS)
500.0000 mL | Freq: Once | INTRAVENOUS | Status: AC
  Administered 2015-04-30: 500 mL via INTRAVENOUS

## 2015-04-30 NOTE — Progress Notes (Signed)
Patient ID: Victoria Morrow, female   DOB: [birth year], 65 y.o.   MRN: [id number] [facility] Progress Note:   * No surgery found *  Subjective: Mental status is clear.  Pain better in upper abdomen Objective: Vital signs in last 24 hours: Temp:  [98 F (36.7 C)-99.6 F (37.6 C)] 98.6 F (37 C) (01/03 1200) Pulse Rate:  [119-139] 130 (01/03 0800) Resp:  [23-41] 33 (01/03 0800) BP: (83-140)/(47-89) 140/89 mmHg (01/03 0800) SpO2:  [95 %-99 %] 96 % (01/03 0800) Weight:  [64 kg (141 lb 1.5 oz)] 64 kg (141 lb 1.5 oz) (01/03 0446)  Intake/Output from previous day: 01/02 0701 - 01/03 0700 In: 2808.3 [P.O.:120; I.V.:2388.3; IV Piggyback:300] Out: 1085 [Urine:1085] Intake/Output this shift: Total I/O In: 250 [I.V.:250] Out: 505 [Urine:505]  Physical Exam: Work of breathing slightly elevated.  Mid abdominal pain  Lab Results:  Results for orders placed or performed during the hospital encounter of 04/28/15 (from the past 48 hour(s))  I-Stat CG4 Lactic Acid, ED     Status: Abnormal   Collection Time: 04/28/15  1:17 PM  Result Value Ref Range   Lactic Acid, Venous 4.20 (HH) 0.5 - 2.0 mmol/L   Comment NOTIFIED PHYSICIAN   Blood culture (routine x 2)     Status: None (Preliminary result)   Collection Time: 04/28/15  1:50 PM  Result Value Ref Range   Specimen Description BLOOD BLOOD LEFT ARM    Special Requests IN PEDIATRIC BOTTLE 1CC    Culture      NO GROWTH < 24 HOURS Performed at [facility]    Report Status PENDING   Lipase, blood     Status: Abnormal   Collection Time: 04/28/15  1:50 PM  Result Value Ref Range   Lipase >3000 (H) 11 - 51 U/L    Comment: RESULT CALLED TO, READ BACK BY AND VERIFIED WITH: B.MCNABB RN AT 2058 ON 1.1.17 BY W.BUCHHOLZ. RESULTS CONFIRMED BY MANUAL DILUTION CORRECTED ON 01/01 AT 2057: PREVIOUSLY REPORTED AS <3000 RESULTS CONFIRMED BY MANUAL DILUTION   Blood culture (routine x 2)     Status: None (Preliminary result)   Collection  Time: 04/28/15  1:55 PM  Result Value Ref Range   Specimen Description BLOOD BLOOD RIGHT HAND    Special Requests IN PEDIATRIC BOTTLE 1CC    Culture      NO GROWTH < 24 HOURS Performed at [facility]    Report Status PENDING   I-Stat CG4 Lactic Acid, ED     Status: None   Collection Time: 04/28/15  5:05 PM  Result Value Ref Range   Lactic Acid, Venous 1.74 0.5 - 2.0 mmol/L  MRSA PCR Screening     Status: None   Collection Time: 04/28/15  5:59 PM  Result Value Ref Range   MRSA by PCR NEGATIVE NEGATIVE    Comment:        The GeneXpert MRSA Assay (FDA approved for NASAL specimens only), is one component of a comprehensive MRSA colonization surveillance program. It is not intended to diagnose MRSA infection nor to guide or monitor treatment for MRSA infections.   Triglycerides     Status: None   Collection Time: 04/28/15  6:20 PM  Result Value Ref Range   Triglycerides 104 <150 mg/dL    Comment: Performed at [facility]  Procalcitonin     Status: None   Collection Time: 04/28/15  6:20 PM  Result Value Ref Range   Procalcitonin 11.43 ng/mL  Comment:        Interpretation: PCT >= 10 ng/mL: Important systemic inflammatory response, almost exclusively due to severe bacterial sepsis or septic shock. (NOTE)         ICU PCT Algorithm               Non ICU PCT Algorithm    ----------------------------     ------------------------------         PCT < 0.25 ng/mL                 PCT < 0.1 ng/mL     Stopping of antibiotics            Stopping of antibiotics       strongly encouraged.               strongly encouraged.    ----------------------------     ------------------------------       PCT level decrease by               PCT < 0.25 ng/mL       >= 80% from peak PCT       OR PCT 0.25 - 0.5 ng/mL          Stopping of antibiotics                                             encouraged.     Stopping of antibiotics           encouraged.     ----------------------------     ------------------------------       PCT level decrease by              PCT >= 0.25 ng/mL       < 80% from peak PCT        AND PCT >= 0.5 ng/mL             Continuing antibiotics                                              encouraged.       Continuing antibiotics            encouraged.    ----------------------------     ------------------------------     PCT level increase compared          PCT > 0.5 ng/mL         with peak PCT AND          PCT >= 0.5 ng/mL             Escalation of antibiotics                                          strongly encouraged.      Escalation of antibiotics        strongly encouraged.   Protime-INR     Status: None   Collection Time: 04/28/15  6:20 PM  Result Value Ref Range   Prothrombin Time 13.8 11.6 - 15.2 seconds   INR 1.04 0.00 - 1.49  APTT     Status: None   Collection Time: 04/28/15  6:20 PM  Result Value Ref Range   aPTT 27 24 - 37 seconds  Lactic acid, plasma     Status: None   Collection Time: 04/28/15  6:20 PM  Result Value Ref Range   Lactic Acid, Venous 2.0 0.5 - 2.0 mmol/L  Basic metabolic panel     Status: Abnormal   Collection Time: 04/28/15  6:20 PM  Result Value Ref Range   Sodium 141 135 - 145 mmol/L   Potassium 5.6 (H) 3.5 - 5.1 mmol/L    Comment: DELTA CHECK NOTED NO VISIBLE HEMOLYSIS    Chloride 117 (H) 101 - 111 mmol/L   CO2 13 (L) 22 - 32 mmol/L   Glucose, Bld 362 (H) 65 - 99 mg/dL   BUN 37 (H) 6 - 20 mg/dL   Creatinine, Ser 2.13 (H) 0.44 - 1.00 mg/dL   Calcium 9.2 8.9 - 10.3 mg/dL   GFR calc non Af Amer 23 (L) >60 mL/min   GFR calc Af Amer 27 (L) >60 mL/min    Comment: (NOTE) The eGFR has been calculated using the CKD EPI equation. This calculation has not been validated in all clinical situations. eGFR's persistently <60 mL/min signify possible Chronic Kidney Disease.    Anion gap 11 5 - 15  Glucose, capillary     Status: Abnormal   Collection Time: 04/28/15  7:45 PM   Result Value Ref Range   Glucose-Capillary 297 (H) 65 - 99 mg/dL  Lactic acid, plasma     Status: None   Collection Time: 04/28/15  9:23 PM  Result Value Ref Range   Lactic Acid, Venous 1.9 0.5 - 2.0 mmol/L  Glucose, capillary     Status: Abnormal   Collection Time: 04/28/15  9:52 PM  Result Value Ref Range   Glucose-Capillary 294 (H) 65 - 99 mg/dL  Urinalysis, Routine w reflex microscopic (not at [facility])     Status: Abnormal   Collection Time: 04/29/15 12:05 AM  Result Value Ref Range   Color, Urine YELLOW YELLOW   APPearance CLOUDY (A) CLEAR   Specific Gravity, Urine 1.013 1.005 - 1.030   pH 5.5 5.0 - 8.0   Glucose, UA 500 (A) NEGATIVE mg/dL   Hgb urine dipstick TRACE (A) NEGATIVE   Bilirubin Urine NEGATIVE NEGATIVE   Ketones, ur NEGATIVE NEGATIVE mg/dL   Protein, ur 30 (A) NEGATIVE mg/dL   Nitrite POSITIVE (A) NEGATIVE   Leukocytes, UA MODERATE (A) NEGATIVE  Urine culture     Status: None (Preliminary result)   Collection Time: 04/29/15 12:05 AM  Result Value Ref Range   Specimen Description URINE, CLEAN CATCH    Special Requests NONE    Culture      >=100,000 COLONIES/mL GRAM NEGATIVE RODS Performed at [facility]    Report Status PENDING   Urine microscopic-add on     Status: Abnormal   Collection Time: 04/29/15 12:05 AM  Result Value Ref Range   Squamous Epithelial / LPF 6-30 (A) NONE SEEN   WBC, UA 6-30 0 - 5 WBC/hpf   RBC / HPF NONE SEEN 0 - 5 RBC/hpf   Bacteria, UA MANY (A) NONE SEEN   Casts HYALINE CASTS (A) NEGATIVE  Glucose, capillary     Status: Abnormal   Collection Time: 04/29/15 12:27 AM  Result Value Ref Range   Glucose-Capillary 274 (H) 65 - 99 mg/dL  Lipase, blood     Status: Abnormal   Collection Time: 04/29/15  3:50 AM  Result Value Ref Range  Lipase 2825 (H) 11 - 51 U/L    Comment: RESULTS CONFIRMED BY MANUAL DILUTION  Comprehensive metabolic panel     Status: Abnormal   Collection Time: 04/29/15  3:50 AM  Result Value Ref Range    Sodium 147 (H) 135 - 145 mmol/L   Potassium 4.1 3.5 - 5.1 mmol/L    Comment: DELTA CHECK NOTED REPEATED TO VERIFY    Chloride 118 (H) 101 - 111 mmol/L   CO2 16 (L) 22 - 32 mmol/L   Glucose, Bld 300 (H) 65 - 99 mg/dL   BUN 39 (H) 6 - 20 mg/dL   Creatinine, Ser 2.25 (H) 0.44 - 1.00 mg/dL   Calcium 8.5 (L) 8.9 - 10.3 mg/dL   Total Protein 6.3 (L) 6.5 - 8.1 g/dL   Albumin 3.2 (L) 3.5 - 5.0 g/dL   AST 94 (H) 15 - 41 U/L   ALT 66 (H) 14 - 54 U/L   Alkaline Phosphatase 65 38 - 126 U/L   Total Bilirubin 1.1 0.3 - 1.2 mg/dL   GFR calc non Af Amer 22 (L) >60 mL/min   GFR calc Af Amer 25 (L) >60 mL/min    Comment: (NOTE) The eGFR has been calculated using the CKD EPI equation. This calculation has not been validated in all clinical situations. eGFR's persistently <60 mL/min signify possible Chronic Kidney Disease.    Anion gap 13 5 - 15  CBC     Status: Abnormal   Collection Time: 04/29/15  3:50 AM  Result Value Ref Range   WBC 16.3 (H) 4.0 - 10.5 K/uL   RBC 4.84 3.87 - 5.11 MIL/uL   Hemoglobin 14.0 12.0 - 15.0 g/dL   HCT 41.1 36.0 - 46.0 %   MCV 84.9 78.0 - 100.0 fL   MCH 28.9 26.0 - 34.0 pg   MCHC 34.1 30.0 - 36.0 g/dL   RDW 13.4 11.5 - 15.5 %   Platelets 335 150 - 400 K/uL  Hemoglobin A1c     Status: Abnormal   Collection Time: 04/29/15  3:50 AM  Result Value Ref Range   Hgb A1c MFr Bld 7.7 (H) 4.8 - 5.6 %    Comment: (NOTE)         Pre-diabetes: 5.7 - 6.4         Diabetes: >6.4         Glycemic control for adults with diabetes: <7.0    Mean Plasma Glucose 174 mg/dL    Comment: (NOTE) Performed At: [facility] [address] [place], [place] [id number] Lindon Romp MD NO:[id number]   Hepatitis panel, acute     Status: None   Collection Time: 04/29/15  3:50 AM  Result Value Ref Range   Hepatitis B Surface Ag Negative Negative   HCV Ab <0.1 0.0 - 0.9 s/co ratio    Comment: (NOTE)                                  Negative:     < 0.8                              Indeterminate: 0.8 - 0.9                                  Positive:     >  0.9 The CDC recommends that a positive HCV antibody result be followed up with a HCV Nucleic Acid Amplification test (010272). Performed At: [facility] [place], [place] [id number] Lindon Romp MD VQ:[id number]    Hep A IgM Negative Negative   Hep B C IgM Negative Negative  Glucose, capillary     Status: Abnormal   Collection Time: 04/29/15  4:23 AM  Result Value Ref Range   Glucose-Capillary 253 (H) 65 - 99 mg/dL  Glucose, capillary     Status: Abnormal   Collection Time: 04/29/15  8:03 AM  Result Value Ref Range   Glucose-Capillary 266 (H) 65 - 99 mg/dL   Comment 1 Notify RN    Comment 2 Document in Chart   Glucose, capillary     Status: Abnormal   Collection Time: 04/29/15 12:09 PM  Result Value Ref Range   Glucose-Capillary 209 (H) 65 - 99 mg/dL   Comment 1 Notify RN    Comment 2 Document in Chart   Basic metabolic panel     Status: Abnormal   Collection Time: 04/29/15  2:56 PM  Result Value Ref Range   Sodium 149 (H) 135 - 145 mmol/L   Potassium 4.0 3.5 - 5.1 mmol/L   Chloride 119 (H) 101 - 111 mmol/L   CO2 18 (L) 22 - 32 mmol/L   Glucose, Bld 192 (H) 65 - 99 mg/dL   BUN 48 (H) 6 - 20 mg/dL   Creatinine, Ser 2.84 (H) 0.44 - 1.00 mg/dL   Calcium 7.8 (L) 8.9 - 10.3 mg/dL   GFR calc non Af Amer 16 (L) >60 mL/min   GFR calc Af Amer 19 (L) >60 mL/min    Comment: (NOTE) The eGFR has been calculated using the CKD EPI equation. This calculation has not been validated in all clinical situations. eGFR's persistently <60 mL/min signify possible Chronic Kidney Disease.    Anion gap 12 5 - 15  Glucose, capillary     Status: Abnormal   Collection Time: 04/29/15  4:47 PM  Result Value Ref Range   Glucose-Capillary 128 (H) 65 - 99 mg/dL   Comment 1 Notify RN    Comment 2 Document in Chart   Glucose, capillary     Status: Abnormal   Collection Time: 04/29/15  8:05 PM   Result Value Ref Range   Glucose-Capillary 108 (H) 65 - 99 mg/dL  Glucose, capillary     Status: None   Collection Time: 04/30/15 12:01 AM  Result Value Ref Range   Glucose-Capillary 85 65 - 99 mg/dL  CBC     Status: Abnormal   Collection Time: 04/30/15  3:36 AM  Result Value Ref Range   WBC 20.6 (H) 4.0 - 10.5 K/uL   RBC 4.11 3.87 - 5.11 MIL/uL   Hemoglobin 11.7 (L) 12.0 - 15.0 g/dL   HCT 34.7 (L) 36.0 - 46.0 %   MCV 84.4 78.0 - 100.0 fL   MCH 28.5 26.0 - 34.0 pg   MCHC 33.7 30.0 - 36.0 g/dL   RDW 13.4 11.5 - 15.5 %   Platelets 265 150 - 400 K/uL  Comprehensive metabolic panel     Status: Abnormal   Collection Time: 04/30/15  3:36 AM  Result Value Ref Range   Sodium 148 (H) 135 - 145 mmol/L   Potassium 3.3 (L) 3.5 - 5.1 mmol/L    Comment: DELTA CHECK NOTED REPEATED TO VERIFY    Chloride 119 (H) 101 - 111 mmol/L  CO2 19 (L) 22 - 32 mmol/L   Glucose, Bld 98 65 - 99 mg/dL   BUN 45 (H) 6 - 20 mg/dL   Creatinine, Ser 2.69 (H) 0.44 - 1.00 mg/dL   Calcium 7.4 (L) 8.9 - 10.3 mg/dL   Total Protein 5.6 (L) 6.5 - 8.1 g/dL   Albumin 2.6 (L) 3.5 - 5.0 g/dL   AST 77 (H) 15 - 41 U/L   ALT 45 14 - 54 U/L   Alkaline Phosphatase 53 38 - 126 U/L   Total Bilirubin 1.2 0.3 - 1.2 mg/dL   GFR calc non Af Amer 17 (L) >60 mL/min   GFR calc Af Amer 20 (L) >60 mL/min    Comment: (NOTE) The eGFR has been calculated using the CKD EPI equation. This calculation has not been validated in all clinical situations. eGFR's persistently <60 mL/min signify possible Chronic Kidney Disease.    Anion gap 10 5 - 15  Lipase, blood     Status: Abnormal   Collection Time: 04/30/15  3:36 AM  Result Value Ref Range   Lipase 2495 (H) 11 - 51 U/L    Comment: RESULTS CONFIRMED BY MANUAL DILUTION  Glucose, capillary     Status: None   Collection Time: 04/30/15  4:20 AM  Result Value Ref Range   Glucose-Capillary 90 65 - 99 mg/dL  Glucose, capillary     Status: None   Collection Time: 04/30/15  7:37 AM   Result Value Ref Range   Glucose-Capillary 78 65 - 99 mg/dL  Urinalysis, Routine w reflex microscopic (not at [facility])     Status: Abnormal   Collection Time: 04/30/15 11:49 AM  Result Value Ref Range   Color, Urine YELLOW YELLOW   APPearance CLEAR CLEAR   Specific Gravity, Urine 1.012 1.005 - 1.030   pH 5.5 5.0 - 8.0   Glucose, UA NEGATIVE NEGATIVE mg/dL   Hgb urine dipstick MODERATE (A) NEGATIVE   Bilirubin Urine SMALL (A) NEGATIVE   Ketones, ur NEGATIVE NEGATIVE mg/dL   Protein, ur 30 (A) NEGATIVE mg/dL   Nitrite NEGATIVE NEGATIVE   Leukocytes, UA TRACE (A) NEGATIVE  Urine microscopic-add on     Status: Abnormal   Collection Time: 04/30/15 11:49 AM  Result Value Ref Range   Squamous Epithelial / LPF 0-5 (A) NONE SEEN   WBC, UA 0-5 0 - 5 WBC/hpf   RBC / HPF 0-5 0 - 5 RBC/hpf   Bacteria, UA FEW (A) NONE SEEN  Glucose, capillary     Status: Abnormal   Collection Time: 04/30/15 11:52 AM  Result Value Ref Range   Glucose-Capillary 113 (H) 65 - 99 mg/dL    Radiology/Results: Ct Abdomen Pelvis Wo Contrast  04/28/2015  CLINICAL DATA:  Diarrhea and abdominal cramping for 3 hours. Blood glucose 305. Abdominal pain. EXAM: CT ABDOMEN AND PELVIS WITHOUT CONTRAST TECHNIQUE: Multidetector CT imaging of the abdomen and pelvis was performed following the standard protocol without IV contrast. COMPARISON:  Abdominal ultrasound of 05/10/2014.  No prior CT. FINDINGS: Lower chest: Clear lung bases. Normal heart size without pericardial or pleural effusion. A tiny hiatal hernia. Hepatobiliary: Normal noncontrast appearance of the liver. Gallstones. No pericholecystic edema. No choledocholithiasis or biliary duct dilatation. Pancreas: Enlargement of the pancreatic neck, head, and uncinate process. Peripancreatic edema, without well-defined collection. No pancreatic duct dilatation. Spleen: Normal in size, without focal abnormality. Adrenals/Urinary Tract: Normal adrenal glands. Mild left and moderate right  renal cortical thinning. No hydronephrosis. No hydroureter or ureteric calculi. No  bladder calculi. Stomach/Bowel: Normal remainder of the stomach. Normal colon and terminal ileum. The appendix is mildly prominent, measuring up to 8 mm on image 58/ series 2. No periappendiceal inflammation. Favored to be within normal variation. Gas and soft tissue density cephalad the transverse duodenum on image 36/ series 2 is favored to be related to a duodenal diverticulum. No edema posterior to this area. Small bowel otherwise unremarkable. Vascular/Lymphatic: Aortic and branch vessel atherosclerosis. No abdominopelvic adenopathy. Reproductive: Normal uterus and adnexa. Other: No free pelvic fluid. small volume perihepatic ascites. Mild pelvic floor laxity. Musculoskeletal: Prominent disc bulge at L4-5. IMPRESSION: 1. Upper abdominal edema, eccentric right. Centered about the pancreatic head, uncinate process, and neck. Favor pancreatitis. Correlate with pancreatic enzymes (pending). If these are abnormal, less likely differential consideration would include peptic ulcer disease. 2. Cholelithiasis without specific evidence of cholecystitis or biliary duct dilatation. 3. Trace perihepatic ascites, likely secondary. 4. Small hiatal hernia. 5. Pelvic floor laxity. 6. Renal atrophy. Electronically Signed   By: Abigail Miyamoto M.D.   On: 04/28/2015 14:49   US Abdomen Limited Ruq  04/28/2015  CLINICAL DATA:  Elevated LFTs. Acute pancreatitis noted on CT, with concern for underlying gallstones. Initial encounter. EXAM: US ABDOMEN LIMITED - RIGHT UPPER QUADRANT COMPARISON:  CT of the abdomen and pelvis performed earlier today at 2:28 p.m. FINDINGS: Gallbladder: Gallbladder wall thickening is noted, with mild pericholecystic fluid. This is nonspecific in the presence of the patient's known pancreatitis. Multiple stones are seen at the gallbladder fundus, measuring up to 5 mm, and a 5 mm stone is seen lodged at the neck of the  gallbladder. No ultrasonographic Murphy's sign is elicited. Common bile duct: Diameter: 0.4 cm, within normal limits in caliber. No obstructing stone is seen. Liver: No focal lesion identified. Within normal limits in parenchymal echogenicity. Assess trace ascites about the liver reflects the patient's known pancreatitis. IMPRESSION: 1. Gallbladder wall thickening, with mild pericholecystic fluid. This is nonspecific in the presence of the patient's known pancreatitis. Stones noted within the gallbladder, with a 5 mm stone lodged at the neck of the gallbladder. Mild acute cholecystitis cannot be entirely excluded, but is considered less likely. 2. No evidence for bile duct obstruction. The patient's apparent duodenal diverticulum is not well characterized on ultrasound. 3. Trace ascites about the liver reflects the patient's known pancreatitis. Electronically Signed   By: Garald Balding M.D.   On: 04/28/2015 21:56    Anti-infectives: Anti-infectives    Start     Dose/Rate Route Frequency Ordered Stop   04/30/15 1100  metroNIDAZOLE (FLAGYL) IVPB 500 mg     500 mg 100 mL/hr over 60 Minutes Intravenous Every 12 hours 04/30/15 0951     04/30/15 1000  cefTRIAXone (ROCEPHIN) 1 g in dextrose 5 % 50 mL IVPB     1 g 100 mL/hr over 30 Minutes Intravenous Every 24 hours 04/30/15 0939     04/30/15 0945  cefTRIAXone (ROCEPHIN) injection 1 g  Status:  Discontinued     1 g Intramuscular Every 24 hours 04/30/15 0930 04/30/15 0938   04/29/15 0200  imipenem-cilastatin (PRIMAXIN) 250 mg in sodium chloride 0.9 % 100 mL IVPB  Status:  Discontinued     250 mg 200 mL/hr over 30 Minutes Intravenous Every 8 hours 04/28/15 1812 04/30/15 0930   04/28/15 1830  imipenem-cilastatin (PRIMAXIN) 250 mg in sodium chloride 0.9 % 100 mL IVPB     250 mg 200 mL/hr over 30 Minutes Intravenous  Once 04/28/15 1812 04/28/15 1904  Assessment/Plan: Problem List: Patient Active Problem List   Diagnosis Date Noted  . Acute  pancreatitis 04/28/2015  . Gallstones 04/28/2015  . Transaminitis 04/28/2015  . DM type 2 (diabetes mellitus, type 2) ([place]) 04/28/2015  . Hyperlipidemia 04/28/2015  . CKD (chronic kidney disease) 04/28/2015  . ARF (acute renal failure) ([place]) 04/28/2015  . RENAL INSUFFICIENCY, CHRONIC 04/29/2009  . UNSPECIFIED ANEMIA 04/11/2009  . OSTEOPENIA 04/04/2009  . DEPRESSION 06/08/2008  . INSOMNIA 02/16/2008  . HYPERCALCEMIA 10/04/2006  . 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 HEMOCCULT POSITIVE STOOL 06/03/2006  . PROTEINURIA 06/03/2006    Pancreatitis with lipase over 2K.  Continue observation.  * No surgery found *    LOS: 2 days   Matt B. Hassell Done, MD, [facility] [phone] beeper [phone]  04/30/2015 12:35 PM

## 2015-04-30 NOTE — Progress Notes (Signed)
eLink Physician-Brief Progress Note Patient Name: Victoria Morrow DOB: 11-22-1950 MRN: [id number]   Date of Service  04/30/2015  HPI/Events of Note  RN reports mild change in mental status. Still with sinus tach on tele. Patient not oriented to year or president.   eICU Interventions  LR 500cc bolus. Limit sedating medications.     Intervention Category Major Interventions: Change in mental status - evaluation and management  Lawanda CousinsJennings Cleveland Yarbro 04/30/2015, 11:03 PM

## 2015-04-30 NOTE — Progress Notes (Signed)
[facility] PROGRESS NOTE  ELLEN GORIS ZOX:[id number] DOB: Sep 17, 1950 DOA: 04/28/2015  PCP: Maryelizabeth Rowan, MD  Brief HPI: 65 year old African-American female with a past medical history of type 2 diabetes on insulin, chronic kidney disease of unknown stage, presented with abdominal pain, nausea and vomiting. She was found to have severe pancreatitis thought to be biliary in origin. She was hospitalized for further management. Critical care medicine was consulted.  Past medical history:  Past Medical History  Diagnosis Date  . Olivopontocerebellar atrophy (HCC)     Mgmt Dr Vickey Huger  . Diabetes mellitus     Type II. Insulin dependent. Diabetic nephropathy.    Consultants: Gen. surgery. Critical care medicine. Urology  Procedures: None yet  Antibiotics: Imipenem 1/1  Subjective: Patient feels slightly better this morning. She states that her pain in the abdomen is somewhat improved. Denies any nausea or vomiting. Patient is not very communicative.  Objective: Vital Signs  Filed Vitals:   04/30/15 0530 04/30/15 0600 04/30/15 0700 04/30/15 0800  BP: 119/67 128/61  140/89  Pulse: 134 131 136 130  Temp:   98 F (36.7 C) 99.6 F (37.6 C)  TempSrc:   Oral Oral  Resp: 37 32 28 33  Height:      Weight:      SpO2: 95% 95% 96% 96%    Intake/Output Summary (Last 24 hours) at 04/30/15 0947 Last data filed at 04/30/15 0700  Gross per 24 hour  Intake 2808.33 ml  Output   1010 ml  Net 1798.33 ml   Filed Weights   04/28/15 1750 04/30/15 0446  Weight: 60.6 kg (133 lb 9.6 oz) 64 kg (141 lb 1.5 oz)    General appearance: alert, cooperative, appears stated age, no distress and Flat affect Resp: Diminished air entry at the bases. Tachypnea. No definite crackles or wheezing. No rhonchi. Cardio: S1, S2 is tachycardic. Regular. No S3, S4. No rubs, murmurs, or bruit. No pedal edema. GI: Soft. Less Tender in the epigastric area without any rebound, rigidity. Mild guarding.  No masses or organomegaly. Bowel sounds are sluggish Extremities: extremities normal, atraumatic, no cyanosis or edema Neurologic: Awake and alert. No focal neurological deficits.  Lab Results:  Basic Metabolic Panel:  Recent Labs Lab 04/28/15 1225 04/28/15 1820 04/29/15 0350 04/29/15 1456 04/30/15 0336  NA 140 141 147* 149* 148*  K 4.2 5.6* 4.1 4.0 3.3*  CL 109 117* 118* 119* 119*  CO2 17* 13* 16* 18* 19*  GLUCOSE 356* 362* 300* 192* 98  BUN 31* 37* 39* 48* 45*  CREATININE 2.08* 2.13* 2.25* 2.84* 2.69*  CALCIUM 10.6* 9.2 8.5* 7.8* 7.4*   Liver Function Tests:  Recent Labs Lab 04/28/15 1225 04/29/15 0350 04/30/15 0336  AST 124* 94* 77*  ALT 67* 66* 45  ALKPHOS 78 65 53  BILITOT 1.0 1.1 1.2  PROT 7.3 6.3* 5.6*  ALBUMIN 4.2 3.2* 2.6*    Recent Labs Lab 04/28/15 1350 04/29/15 0350 04/30/15 0336  LIPASE >3000* 2825* 2495*   No results for input(s): AMMONIA in the last 168 hours. CBC:  Recent Labs Lab 04/28/15 1225 04/29/15 0350 04/30/15 0336  WBC 33.2* 16.3* 20.6*  HGB 13.9 14.0 11.7*  HCT 40.8 41.1 34.7*  MCV 87.0 84.9 84.4  PLT 460* 335 265    CBG:  Recent Labs Lab 04/29/15 1647 04/29/15 2005 04/30/15 0001 04/30/15 0420 04/30/15 0737  GLUCAP 128* 108* 85 90 78    Recent Results (from the past 240 hour(s))  Blood culture (routine x  2)     Status: None (Preliminary result)   Collection Time: 04/28/15  1:50 PM  Result Value Ref Range Status   Specimen Description BLOOD BLOOD LEFT ARM  Final   Special Requests IN PEDIATRIC BOTTLE 1CC  Final   Culture   Final    NO GROWTH < 24 HOURS Performed at [facility]    Report Status PENDING  Incomplete  Blood culture (routine x 2)     Status: None (Preliminary result)   Collection Time: 04/28/15  1:55 PM  Result Value Ref Range Status   Specimen Description BLOOD BLOOD RIGHT HAND  Final   Special Requests IN PEDIATRIC BOTTLE 1CC  Final   Culture   Final    NO GROWTH < 24  HOURS Performed at [facility]    Report Status PENDING  Incomplete  MRSA PCR Screening     Status: None   Collection Time: 04/28/15  5:59 PM  Result Value Ref Range Status   MRSA by PCR NEGATIVE NEGATIVE Final    Comment:        The GeneXpert MRSA Assay (FDA approved for NASAL specimens only), is one component of a comprehensive MRSA colonization surveillance program. It is not intended to diagnose MRSA infection nor to guide or monitor treatment for MRSA infections.       Studies/Results: Ct Abdomen Pelvis Wo Contrast  04/28/2015  CLINICAL DATA:  Diarrhea and abdominal cramping for 3 hours. Blood glucose 305. Abdominal pain. EXAM: CT ABDOMEN AND PELVIS WITHOUT CONTRAST TECHNIQUE: Multidetector CT imaging of the abdomen and pelvis was performed following the standard protocol without IV contrast. COMPARISON:  Abdominal ultrasound of 05/10/2014.  No prior CT. FINDINGS: Lower chest: Clear lung bases. Normal heart size without pericardial or pleural effusion. A tiny hiatal hernia. Hepatobiliary: Normal noncontrast appearance of the liver. Gallstones. No pericholecystic edema. No choledocholithiasis or biliary duct dilatation. Pancreas: Enlargement of the pancreatic neck, head, and uncinate process. Peripancreatic edema, without well-defined collection. No pancreatic duct dilatation. Spleen: Normal in size, without focal abnormality. Adrenals/Urinary Tract: Normal adrenal glands. Mild left and moderate right renal cortical thinning. No hydronephrosis. No hydroureter or ureteric calculi. No bladder calculi. Stomach/Bowel: Normal remainder of the stomach. Normal colon and terminal ileum. The appendix is mildly prominent, measuring up to 8 mm on image 58/ series 2. No periappendiceal inflammation. Favored to be within normal variation. Gas and soft tissue density cephalad the transverse duodenum on image 36/ series 2 is favored to be related to a duodenal diverticulum. No edema posterior to  this area. Small bowel otherwise unremarkable. Vascular/Lymphatic: Aortic and branch vessel atherosclerosis. No abdominopelvic adenopathy. Reproductive: Normal uterus and adnexa. Other: No free pelvic fluid. small volume perihepatic ascites. Mild pelvic floor laxity. Musculoskeletal: Prominent disc bulge at L4-5. IMPRESSION: 1. Upper abdominal edema, eccentric right. Centered about the pancreatic head, uncinate process, and neck. Favor pancreatitis. Correlate with pancreatic enzymes (pending). If these are abnormal, less likely differential consideration would include peptic ulcer disease. 2. Cholelithiasis without specific evidence of cholecystitis or biliary duct dilatation. 3. Trace perihepatic ascites, likely secondary. 4. Small hiatal hernia. 5. Pelvic floor laxity. 6. Renal atrophy. Electronically Signed   By: Jeronimo Greaves M.D.   On: 04/28/2015 14:49   US Abdomen Limited Ruq  04/28/2015  CLINICAL DATA:  Elevated LFTs. Acute pancreatitis noted on CT, with concern for underlying gallstones. Initial encounter. EXAM: US ABDOMEN LIMITED - RIGHT UPPER QUADRANT COMPARISON:  CT of the abdomen and pelvis performed earlier today  at 2:28 p.m. FINDINGS: Gallbladder: Gallbladder wall thickening is noted, with mild pericholecystic fluid. This is nonspecific in the presence of the patient's known pancreatitis. Multiple stones are seen at the gallbladder fundus, measuring up to 5 mm, and a 5 mm stone is seen lodged at the neck of the gallbladder. No ultrasonographic Murphy's sign is elicited. Common bile duct: Diameter: 0.4 cm, within normal limits in caliber. No obstructing stone is seen. Liver: No focal lesion identified. Within normal limits in parenchymal echogenicity. Assess trace ascites about the liver reflects the patient's known pancreatitis. IMPRESSION: 1. Gallbladder wall thickening, with mild pericholecystic fluid. This is nonspecific in the presence of the patient's known pancreatitis. Stones noted within the  gallbladder, with a 5 mm stone lodged at the neck of the gallbladder. Mild acute cholecystitis cannot be entirely excluded, but is considered less likely. 2. No evidence for bile duct obstruction. The patient's apparent duodenal diverticulum is not well characterized on ultrasound. 3. Trace ascites about the liver reflects the patient's known pancreatitis. Electronically Signed   By: Roanna Raider M.D.   On: 04/28/2015 21:56    Medications:  Scheduled: . antiseptic oral rinse  7 mL Mouth Rinse q12n4p  . cefTRIAXone (ROCEPHIN)  IV  1 g Intravenous Q24H  . chlorhexidine  15 mL Mouth Rinse BID  . heparin  5,000 Units Subcutaneous 3 times per day  . insulin aspart  0-20 Units Subcutaneous 6 times per day  . insulin glargine  20 Units Subcutaneous QHS  . sodium chloride  3 mL Intravenous Q12H   Continuous: . lactated ringers 125 mL/hr at 04/30/15 1610   RUE:[id number] **OR** acetaminophen, albuterol, HYDROmorphone (DILAUDID) injection, ondansetron **OR** ondansetron (ZOFRAN) IV, oxyCODONE  Assessment/Plan:  Principal Problem:   Acute pancreatitis Active Problems:   Gallstones   Transaminitis   DM type 2 (diabetes mellitus, type 2) (HCC)   Hyperlipidemia   CKD (chronic kidney disease)   ARF (acute renal failure) (HCC)    Acute Biliary pancreatitis CT scan also showed gallstones. Patient denies any history of alcohol intake. This is probably gallstone pancreatitis. Ultrasound of the abdomen as above. CBD is normal size. General surgery is following. No clear evidence for acute cholecystitis. Triglyceride was 104. Patient states that she is feeling slightly better this morning. She remains tachycardic. Due to severity of her pancreatitis and significantly elevated WBC she was started on imipenem which will be continued for now. No necrotic changes in the pancreas noted on CT scan.  Sinus Tachycardia and tachypnea Most likely due to severe inflammation and acute illness. Continue  supportive treatment as outlined. Appreciate critical care medicine input. Continue to monitor for now.  Metabolic acidosis/Lactic acidosis/possible sepsis Bicarbonate level is improved. Bicarbonate infusion was discontinued by critical care medicine. She is on lactated Ringer's. Anion gap continues to be normal. Lactic acid level has improved. Ketones were negative. She was experiencing diarrhea as well. Her metabolic acidosis is most likely multifactorial including renal failure, hypovolemia. Continue to monitor closely for now. Pro-calcitonin levels noted to be elevated. Continue imipenem. WBC is stable.  Possible acute on chronic kidney disease (unknown stage) with urinary retention Renal function appears to be improving slowly. Urine output has picked up. Some degree of urinary retention was present. Foley catheter was placed by urology. Appreciate their assistance. Continue strict ins and outs. Continue IV fluids. No hydronephrosis noted on CT scan.  Transaminitis LFTs are improving. Probably related to her acute pancreatitis and gallstones. Ultrasound of the right upper quadrant as above.  General surgery is following. Hepatitis panel is negative. Continue to trend LFTs.  Diabetes mellitus type 2, poorly controlled, with chronic kidney disease Blood sugar is noted to be on the lower side this morning. Cut back on dose of Lantus. HbA1c is 7.7.    DVT Prophylaxis: Subcutaneous heparin    Code Status: Full code  Family Communication: Discussed with the patient  Disposition Plan: Continue current treatment as outlined above. Will remain in step down for now.     LOS: 2 days   Malcom Randall Va Medical CenterKRISHNAN,Kenzlei Runions  [facility] Pager [phone] 04/30/2015, 9:47 AM  If 7PM-7AM, please contact night-coverage at [email or web address], password [email or web address]

## 2015-04-30 NOTE — Progress Notes (Signed)
PULMONARY / CRITICAL CARE MEDICINE   Name: Victoria Morrow MRN: [id number] DOB: [birth year]    ADMISSION DATE:  04/28/2015 CONSULTATION DATE: 1/2  REFERRING MD:  Rito EhrlichKrishnan  CHIEF COMPLAINT:  Hypotension and pancreatitis   BRIEF DESCRIPTION:   65 year old female admitted on 1/1 with severe acute pancreatitis believed to be related to gallstones but no CBD or pancreatic duct dilation on admission.  Takes ACE inhibitor baseline.  Has mild gall bladder wall thickening on ultrasound with 5mm stone in neck of gallbladder.   SUBJECTIVE:  Feels better today Wants to eat  VITAL SIGNS: BP 140/89 mmHg  Pulse 130  Temp(Src) 99.6 F (37.6 C) (Oral)  Resp 33  Ht 5\' 2"  (1.575 m)  Wt 64 kg (141 lb 1.5 oz)  BMI 25.80 kg/m2  SpO2 96%  HEMODYNAMICS:    VENTILATOR SETTINGS:    INTAKE / OUTPUT: I/O last 3 completed shifts: In: 4003.3 [P.O.:120; I.V.:3483.3; IV Piggyback:400] Out: 1310 [Urine:1310]  PHYSICAL EXAMINATION: General:  Resting in bed, no distress HENT: NCAT, OP clear PULM: CTA B, equal air entry CV: tachy, regular GI: BS infrequent, mild tenderness epigastric area but no Murphy's sign MSK: normal bulk, tone Neuro: A&Ox4, MAEW  LABS:  BMET  Recent Labs Lab 04/29/15 0350 04/29/15 1456 04/30/15 0336  NA 147* 149* 148*  K 4.1 4.0 3.3*  CL 118* 119* 119*  CO2 16* 18* 19*  BUN 39* 48* 45*  CREATININE 2.25* 2.84* 2.69*  GLUCOSE 300* 192* 98    Electrolytes  Recent Labs Lab 04/29/15 0350 04/29/15 1456 04/30/15 0336  CALCIUM 8.5* 7.8* 7.4*    CBC  Recent Labs Lab 04/28/15 1225 04/29/15 0350 04/30/15 0336  WBC 33.2* 16.3* 20.6*  HGB 13.9 14.0 11.7*  HCT 40.8 41.1 34.7*  PLT 460* 335 265    Coag's  Recent Labs Lab 04/28/15 1820  APTT 27  INR 1.04    Sepsis Markers  Recent Labs Lab 04/28/15 1705 04/28/15 1820 04/28/15 2123  LATICACIDVEN 1.74 2.0 1.9  PROCALCITON  --  11.43  --     ABG No results for input(s): PHART, PCO2ART, PO2ART  in the last 168 hours.  Liver Enzymes  Recent Labs Lab 04/28/15 1225 04/29/15 0350 04/30/15 0336  AST 124* 94* 77*  ALT 67* 66* 45  ALKPHOS 78 65 53  BILITOT 1.0 1.1 1.2  ALBUMIN 4.2 3.2* 2.6*    Cardiac Enzymes No results for input(s): TROPONINI, PROBNP in the last 168 hours.  Glucose  Recent Labs Lab 04/29/15 1209 04/29/15 1647 04/29/15 2005 04/30/15 0001 04/30/15 0420 04/30/15 0737  GLUCAP 209* 128* 108* 85 90 78    Imaging No results found.   STUDIES:  CT abd 1/1 1. Upper abdominal edema, eccentric right. Centered about the pancreatic head, uncinate process, and neck. Favor pancreatitis. Correlate with pancreatic enzymes (pending). If these are abnormal, less likely differential consideration would include peptic ulcer Disease. 2. Cholelithiasis without specific evidence of cholecystitis or biliary duct dilatation. 3. Trace perihepatic ascites, likely secondary. 4. Small hiatal hernia. 5. Pelvic floor laxity. 6. Renal atrophy. US abd 1/1: 1. Gallbladder wall thickening, with mild pericholecystic fluid.This is nonspecific in the presence of the patient's known pancreatitis. Stones noted within the gallbladder, with a 5 mm stone lodged at the neck of the gallbladder. Mild acute cholecystitis cannot be entirely excluded, but is considered less likely. 2. No evidence for bile duct obstruction. The patient's apparent duodenal diverticulum is not well characterized on ultrasound.  CULTURES: BCX2 1/1>>> UC  1/1>>>  ANTIBIOTICS: Imipenem 1/1>>> 1/3 Ceftriaxone 1/3 >  Flagyl 1/3 >   SIGNIFICANT EVENTS:   LINES/TUBES:   DISCUSSION: 65 year old female admitted severe acute pancreatitis, presumably gallstone related but no obstructing stone seen on imaging aside from one in neck of gallbladder.  On ACE so drug related is possible as well.  Also with mild gallbladder wall thickening, but no clear cholecystitis.  Vitals consistent with severe acute pancreatitis.   Possibly UTI, had contaminated urine specimen on admission.  ASSESSMENT / PLAN:  PULMONARY A: Tachypnea but no hypoxemia; at risk for ARDS with pancreatitis P:   Aspiration precautions Monitor O2 saturation IS q hour while awake  CARDIOVASCULAR A:  SIRS in setting of pancreatitis, doubt sepsis (LA normal) Mild hypotension overnight 1/2, resolved P:  Cont IVFs, increase rate SBP goal >95 Tele  RENAL A:   AKI, UOP OK NAGMA Hyperchloremia  Hypernatremia  P:   Continue LR, increase to 150cc/hr I&O Monitor BMET and UOP Replace electrolytes as needed  GASTROINTESTINAL A:   Acute pancreatitis--> she says pain control is adequate Gall bladder wall thickening, GB stone but neg murphy's sign 1/3  P:   Trend LFT Continue Pain control NPO/bowel rest w/ sips only for today Cholecystectomy per gen surg   Continue antibiotic coverage today for GB, but she doesn't need imipenem for pancreatitis (no benefit)  HEMATOLOGIC A:   No acute issues P:  Trend CBC Transfuse per ICU protocol  [place] heparin   INFECTIOUS A:   SIRS most likely due to pancreatitis; doubt cholecystitis based on exam, less likely UTI (dirty urine on U/A)  P:   F/u culture data Repeat U/A> pyuria? Continue antibiotic coverage with ceftriaxone and metronidazole for possible cholecystitis considering vital sign abnormalities Continue serial GB exams  ENDOCRINE A:   Hyperglycemia P:   ICU glycemic control protocol   NEUROLOGIC A:   Pain P:   Dilaudid prn   FAMILY  - Updates: friend updated bedside 1/2  - Inter-disciplinary family meet or Palliative Care meeting due by:  Day 7  She remains critically ill with multi-organ failure.  My cc time managing multiple databases of information, assessing status, reviewing imaging and labs and managing her medications is 40 minutes  Heber Hyde, MD [facility] Pager: [phone] Cell: [phone] After 3pm or if no response, call  [phone]    04/30/2015, 9:37 AM

## 2015-05-01 ENCOUNTER — Inpatient Hospital Stay (HOSPITAL_COMMUNITY): Payer: Commercial Managed Care - HMO

## 2015-05-01 DIAGNOSIS — J9620 Acute and chronic respiratory failure, unspecified whether with hypoxia or hypercapnia: Secondary | ICD-10-CM | POA: Insufficient documentation

## 2015-05-01 DIAGNOSIS — R740 Nonspecific elevation of levels of transaminase and lactic acid dehydrogenase [LDH]: Secondary | ICD-10-CM

## 2015-05-01 DIAGNOSIS — E785 Hyperlipidemia, unspecified: Secondary | ICD-10-CM

## 2015-05-01 DIAGNOSIS — J9621 Acute and chronic respiratory failure with hypoxia: Secondary | ICD-10-CM

## 2015-05-01 DIAGNOSIS — J690 Pneumonitis due to inhalation of food and vomit: Secondary | ICD-10-CM | POA: Insufficient documentation

## 2015-05-01 DIAGNOSIS — N189 Chronic kidney disease, unspecified: Secondary | ICD-10-CM

## 2015-05-01 DIAGNOSIS — J9601 Acute respiratory failure with hypoxia: Secondary | ICD-10-CM

## 2015-05-01 LAB — BLOOD GAS, ARTERIAL
Acid-base deficit: 5 mmol/L — ABNORMAL HIGH (ref 0.0–2.0)
BICARBONATE: 18 meq/L — AB (ref 20.0–24.0)
DRAWN BY: 308601
FIO2: 0.8
O2 Saturation: 97.7 %
PATIENT TEMPERATURE: 98.6
PCO2 ART: 27.6 mmHg — AB (ref 35.0–45.0)
PEEP/CPAP: 10 cmH2O
PH ART: 7.429 (ref 7.350–7.450)
RATE: 20 resp/min
TCO2: 16.5 mmol/L (ref 0–100)
VT: 400 mL
pO2, Arterial: 123 mmHg — ABNORMAL HIGH (ref 80.0–100.0)

## 2015-05-01 LAB — CBC WITH DIFFERENTIAL/PLATELET
BASOS ABS: 0 10*3/uL (ref 0.0–0.1)
BASOS PCT: 0 %
EOS ABS: 0 10*3/uL (ref 0.0–0.7)
EOS PCT: 0 %
HCT: 33.2 % — ABNORMAL LOW (ref 36.0–46.0)
Hemoglobin: 11.2 g/dL — ABNORMAL LOW (ref 12.0–15.0)
LYMPHS PCT: 7 %
Lymphs Abs: 1.3 10*3/uL (ref 0.7–4.0)
MCH: 28.7 pg (ref 26.0–34.0)
MCHC: 33.7 g/dL (ref 30.0–36.0)
MCV: 85.1 fL (ref 78.0–100.0)
MONO ABS: 1.2 10*3/uL — AB (ref 0.1–1.0)
Monocytes Relative: 7 %
Neutro Abs: 15.3 10*3/uL — ABNORMAL HIGH (ref 1.7–7.7)
Neutrophils Relative %: 86 %
PLATELETS: 256 10*3/uL (ref 150–400)
RBC: 3.9 MIL/uL (ref 3.87–5.11)
RDW: 13.6 % (ref 11.5–15.5)
WBC: 17.8 10*3/uL — AB (ref 4.0–10.5)

## 2015-05-01 LAB — COMPREHENSIVE METABOLIC PANEL
ALBUMIN: 2.3 g/dL — AB (ref 3.5–5.0)
ALK PHOS: 55 U/L (ref 38–126)
ALT: 28 U/L (ref 14–54)
AST: 44 U/L — AB (ref 15–41)
Anion gap: 14 (ref 5–15)
BILIRUBIN TOTAL: 1 mg/dL (ref 0.3–1.2)
BUN: 39 mg/dL — AB (ref 6–20)
CALCIUM: 6.6 mg/dL — AB (ref 8.9–10.3)
CO2: 18 mmol/L — ABNORMAL LOW (ref 22–32)
CREATININE: 2.39 mg/dL — AB (ref 0.44–1.00)
Chloride: 117 mmol/L — ABNORMAL HIGH (ref 101–111)
GFR calc Af Amer: 23 mL/min — ABNORMAL LOW (ref >60)
GFR, EST NON AFRICAN AMERICAN: 20 mL/min — AB (ref >60)
GLUCOSE: 129 mg/dL — AB (ref 65–99)
Potassium: 3.6 mmol/L (ref 3.5–5.1)
Sodium: 149 mmol/L — ABNORMAL HIGH (ref 135–145)
TOTAL PROTEIN: 5.4 g/dL — AB (ref 6.5–8.1)

## 2015-05-01 LAB — GLUCOSE, CAPILLARY
GLUCOSE-CAPILLARY: 130 mg/dL — AB (ref 65–99)
GLUCOSE-CAPILLARY: 160 mg/dL — AB (ref 65–99)
GLUCOSE-CAPILLARY: 177 mg/dL — AB (ref 65–99)
Glucose-Capillary: 137 mg/dL — ABNORMAL HIGH (ref 65–99)
Glucose-Capillary: 115 mg/dL — ABNORMAL HIGH (ref 65–99)
Glucose-Capillary: 181 mg/dL — ABNORMAL HIGH (ref 65–99)

## 2015-05-01 LAB — LIPASE, BLOOD: LIPASE: 364 U/L — AB (ref 11–51)

## 2015-05-01 MED ORDER — SODIUM CHLORIDE 0.45 % IV SOLN
INTRAVENOUS | Status: DC
  Administered 2015-05-01 – 2015-05-02 (×4): via INTRAVENOUS
  Filled 2015-05-01 (×11): qty 1000

## 2015-05-01 MED ORDER — FENTANYL CITRATE (PF) 100 MCG/2ML IJ SOLN
50.0000 ug | INTRAMUSCULAR | Status: DC | PRN
  Administered 2015-05-01 – 2015-05-02 (×2): 100 ug via INTRAVENOUS
  Filled 2015-05-01 (×2): qty 2

## 2015-05-01 MED ORDER — ALBUTEROL SULFATE (2.5 MG/3ML) 0.083% IN NEBU
2.5000 mg | INHALATION_SOLUTION | RESPIRATORY_TRACT | Status: DC
  Filled 2015-05-01: qty 3

## 2015-05-01 MED ORDER — PHENYLEPHRINE HCL 10 MG/ML IJ SOLN
0.0000 ug/min | INTRAVENOUS | Status: DC
  Administered 2015-05-01: 200 ug/min via INTRAVENOUS
  Administered 2015-05-02: 50 ug/min via INTRAVENOUS
  Administered 2015-05-02: 80 ug/min via INTRAVENOUS
  Administered 2015-05-02: 100 ug/min via INTRAVENOUS
  Administered 2015-05-03: 70 ug/min via INTRAVENOUS
  Administered 2015-05-03: 100 ug/min via INTRAVENOUS
  Filled 2015-05-01 (×7): qty 4

## 2015-05-01 MED ORDER — PHENYLEPHRINE HCL 10 MG/ML IJ SOLN
0.0000 ug/min | INTRAMUSCULAR | Status: DC
  Administered 2015-05-01: 150 ug/min via INTRAVENOUS
  Administered 2015-05-01: 50 ug/min via INTRAVENOUS
  Filled 2015-05-01 (×3): qty 1

## 2015-05-01 MED ORDER — SODIUM CHLORIDE 0.9 % IV SOLN
250.0000 mg | INTRAVENOUS | Status: AC
  Administered 2015-05-01: 250 mg via INTRAVENOUS
  Filled 2015-05-01: qty 250

## 2015-05-01 MED ORDER — FENTANYL CITRATE (PF) 100 MCG/2ML IJ SOLN: 50.0000 ug | INTRAMUSCULAR | Status: DC | PRN

## 2015-05-01 MED ORDER — MIDAZOLAM HCL 2 MG/2ML IJ SOLN
2.0000 mg | Freq: Once | INTRAMUSCULAR | Status: AC
  Administered 2015-05-01: 2 mg via INTRAVENOUS

## 2015-05-01 MED ORDER — SODIUM CHLORIDE 0.9 % IV SOLN
1.0000 g | Freq: Once | INTRAVENOUS | Status: AC
  Administered 2015-05-01: 1 g via INTRAVENOUS
  Filled 2015-05-01: qty 10

## 2015-05-01 MED ORDER — SODIUM CHLORIDE 0.9 % IV SOLN
25.0000 ug/h | INTRAVENOUS | Status: DC
  Administered 2015-05-01 (×2): 50 ug/h via INTRAVENOUS
  Administered 2015-05-02: 200 ug/h via INTRAVENOUS
  Filled 2015-05-01 (×2): qty 50

## 2015-05-01 MED ORDER — FENTANYL CITRATE (PF) 100 MCG/2ML IJ SOLN
50.0000 ug | Freq: Once | INTRAMUSCULAR | Status: AC
  Administered 2015-05-01: 50 ug via INTRAVENOUS

## 2015-05-01 MED ORDER — IOHEXOL 300 MG/ML  SOLN
50.0000 mL | Freq: Once | INTRAMUSCULAR | Status: AC | PRN
  Administered 2015-05-01: 50 mL via ORAL

## 2015-05-01 MED ORDER — SODIUM BICARBONATE 8.4 % IV SOLN: INTRAVENOUS | Status: DC

## 2015-05-01 MED ORDER — MIDAZOLAM HCL 2 MG/2ML IJ SOLN
1.0000 mg | INTRAMUSCULAR | Status: DC | PRN
  Administered 2015-05-01: 1 mg via INTRAVENOUS

## 2015-05-01 MED ORDER — VITAMINS A & D EX OINT
TOPICAL_OINTMENT | CUTANEOUS | Status: AC
  Administered 2015-05-01: 1
  Filled 2015-05-01: qty 5

## 2015-05-01 MED ORDER — DEXTROSE 5 % IV SOLN: 1.0000 g | INTRAVENOUS | Status: DC

## 2015-05-01 MED ORDER — FENTANYL CITRATE (PF) 100 MCG/2ML IJ SOLN
50.0000 ug | INTRAMUSCULAR | Status: AC | PRN
  Administered 2015-05-01 (×3): 50 ug via INTRAVENOUS
  Filled 2015-05-01 (×3): qty 2

## 2015-05-01 MED ORDER — ANTISEPTIC ORAL RINSE SOLUTION (CORINZ)
7.0000 mL | Freq: Four times a day (QID) | OROMUCOSAL | Status: DC
Start: 2015-05-01 — End: 2015-05-02
  Administered 2015-05-01 – 2015-05-02 (×3): 7 mL via OROMUCOSAL

## 2015-05-01 MED ORDER — LEVALBUTEROL HCL 0.63 MG/3ML IN NEBU
0.6300 mg | INHALATION_SOLUTION | RESPIRATORY_TRACT | Status: DC
  Administered 2015-05-01 – 2015-05-03 (×11): 0.63 mg via RESPIRATORY_TRACT
  Filled 2015-05-01 (×11): qty 3

## 2015-05-01 MED ORDER — DEXMEDETOMIDINE HCL IN NACL 200 MCG/50ML IV SOLN
0.0000 ug/kg/h | INTRAVENOUS | Status: DC
  Administered 2015-05-01: 1.2 ug/kg/h via INTRAVENOUS
  Administered 2015-05-01: 0.4 ug/kg/h via INTRAVENOUS
  Administered 2015-05-01 – 2015-05-02 (×3): 1.2 ug/kg/h via INTRAVENOUS
  Administered 2015-05-02: 0.6 ug/kg/h via INTRAVENOUS
  Administered 2015-05-02 (×2): 1.2 ug/kg/h via INTRAVENOUS
  Filled 2015-05-01: qty 50
  Filled 2015-05-01 (×2): qty 100
  Filled 2015-05-01 (×3): qty 50

## 2015-05-01 MED ORDER — ROCURONIUM BROMIDE 50 MG/5ML IV SOLN
1.0000 mg/kg | Freq: Once | INTRAVENOUS | Status: AC
Start: 2015-05-01 — End: 2015-05-01
  Administered 2015-05-01: 66.5 mg via INTRAVENOUS

## 2015-05-01 MED ORDER — MIDAZOLAM HCL 2 MG/2ML IJ SOLN
INTRAMUSCULAR | Status: AC
  Filled 2015-05-01: qty 4

## 2015-05-01 MED ORDER — SODIUM CHLORIDE 0.9 % IV SOLN
250.0000 mg | Freq: Three times a day (TID) | INTRAVENOUS | Status: DC
  Filled 2015-05-01: qty 250

## 2015-05-01 MED ORDER — PANTOPRAZOLE SODIUM 40 MG IV SOLR
40.0000 mg | Freq: Every day | INTRAVENOUS | Status: DC
  Administered 2015-05-01 – 2015-05-02 (×2): 40 mg via INTRAVENOUS
  Filled 2015-05-01 (×3): qty 40

## 2015-05-01 MED ORDER — SODIUM CHLORIDE 0.45 % IV SOLN: INTRAVENOUS | Status: DC

## 2015-05-01 MED ORDER — LACTATED RINGERS IV BOLUS (SEPSIS)
250.0000 mL | Freq: Once | INTRAVENOUS | Status: AC
  Administered 2015-05-01: 250 mL via INTRAVENOUS

## 2015-05-01 MED ORDER — CHLORHEXIDINE GLUCONATE 0.12% ORAL RINSE (MEDLINE KIT)
15.0000 mL | Freq: Two times a day (BID) | OROMUCOSAL | Status: DC
  Administered 2015-05-01 – 2015-05-02 (×3): 15 mL via OROMUCOSAL

## 2015-05-01 MED ORDER — FENTANYL CITRATE (PF) 100 MCG/2ML IJ SOLN
INTRAMUSCULAR | Status: AC
  Administered 2015-05-01: 50 ug via INTRAVENOUS
  Filled 2015-05-01: qty 2

## 2015-05-01 MED ORDER — MIDAZOLAM HCL 2 MG/2ML IJ SOLN
1.0000 mg | Freq: Once | INTRAMUSCULAR | Status: AC
  Administered 2015-05-01: 1 mg via INTRAVENOUS

## 2015-05-01 MED ORDER — MIDAZOLAM HCL 2 MG/2ML IJ SOLN
1.0000 mg | INTRAMUSCULAR | Status: DC | PRN
  Administered 2015-05-01 (×2): 1 mg via INTRAVENOUS
  Filled 2015-05-01 (×3): qty 2

## 2015-05-01 NOTE — Progress Notes (Signed)
  Subjective: Tachycardia is sinus.  Her I/O is positive 2 liters, IV at 150 ml/hr.  She still has pain, but says it's better.  This is the first time I have seen her but pain is all over the abdomen.  Notes suggest change in mental status last PM, but I don't see anything else documented.  She has a flat affect this AM.    Objective: Vital signs in last 24 hours: Temp:  [97.7 F (36.5 C)-99.6 F (37.6 C)] 98.2 F (36.8 C) (01/04 0503) Pulse Rate:  [127-152] 137 (01/04 0712) Resp:  [26-39] 26 (01/04 0712) BP: (93-147)/(59-90) 98/75 mmHg (01/04 0600) SpO2:  [89 %-97 %] 96 % (01/04 0712) Weight:  [66.5 kg (146 lb 9.7 oz)] 66.5 kg (146 lb 9.7 oz) (01/04 0503) Last BM Date: 04/29/15 100 PO  NPO 1340 urine. Afebrile, tachycardic, HR up into the 140 range last PM Sats OK on [place] @ 3 liters LFT's OK, lipase coming down, creatinine is still up, but stable. WBC 17.8K, H/H is stable.   Intake/Output from previous day: 01/03 0701 - 01/04 0700 In: 3375 [P.O.:100; I.V.:2925; IV Piggyback:350] Out: 1340 [Urine:1340] Intake/Output this shift:    General appearance: alert, cooperative, no distress and flat affect Resp: clear to auscultation bilaterally and anterior exam GI: abdomen is distended. She has pain to palpation all over her abdomen.  she points to mid epigastric area as the worst, but says it is better.  No BS.  Lab Results:   Recent Labs  04/30/15 0336 05/01/15 0340  WBC 20.6* 17.8*  HGB 11.7* 11.2*  HCT 34.7* 33.2*  PLT 265 256    BMET  Recent Labs  04/30/15 0336 05/01/15 0340  NA 148* 149*  K 3.3* 3.6  CL 119* 117*  CO2 19* 18*  GLUCOSE 98 129*  BUN 45* 39*  CREATININE 2.69* 2.39*  CALCIUM 7.4* 6.6*   PT/INR  Recent Labs  04/28/15 1820  LABPROT 13.8  INR 1.04     Recent Labs Lab 04/28/15 1225 04/29/15 0350 04/30/15 0336 05/01/15 0340  AST 124* 94* 77* 44*  ALT 67* 66* 45 28  ALKPHOS 78 65 53 55  BILITOT 1.0 1.1 1.2 1.0  PROT 7.3 6.3* 5.6*  5.4*  ALBUMIN 4.2 3.2* 2.6* 2.3*     Lipase     Component Value Date/Time   LIPASE 364* 05/01/2015 0340     Studies/Results: No results found.  Medications: . antiseptic oral rinse  7 mL Mouth Rinse q12n4p  . cefTRIAXone (ROCEPHIN)  IV  1 g Intravenous Q24H  . chlorhexidine  15 mL Mouth Rinse BID  . heparin  5,000 Units Subcutaneous 3 times per day  . insulin aspart  0-20 Units Subcutaneous 6 times per day  . insulin glargine  10 Units Subcutaneous QHS  . metronidazole  500 mg Intravenous Q12H  . sodium chloride  3 mL Intravenous Q12H   . lactated ringers at 150 ml per hour 1,000 mL (05/01/15 0501)    Assessment/Plan Gallstone pancreatitis AODM type II Chronic renal disease creatinine is 2.39 Hyperlipidemia Olivopontocerebellar atrophy  Antibiotics: Day 4; day 1&2 imipenem-cilastatin , day 2 of ceftriaxone and flagyl. DVT: Heparin/SCD   Plan:  Ongoing pancreatitis.  Her labs are better but exam suggest pancreatitis is ongoing.  Continue medical management.  She is not ready for any surgical treatment at this point.  Her initial CT was on 04/28/15.    LOS: 3 days    Brigida Scotti 05/01/2015

## 2015-05-01 NOTE — Procedures (Signed)
Central Venous Catheter Insertion Procedure Note Ave Filternn C Soza [id number] 26-Dec-1950  Procedure: Insertion of Central Venous Catheter Indications: Assessment of intravascular volume and Drug and/or fluid administration  Procedure Details Consent: Risks of procedure as well as the alternatives and risks of each were explained to the (patient/caregiver).  Consent for procedure obtained. Time Out: Verified patient identification, verified procedure, site/side was marked, verified correct patient position, special equipment/implants available, medications/allergies/relevent history reviewed, required imaging and test results available.  Performed  Maximum sterile technique was used including antiseptics, cap, gloves, gown, hand hygiene, mask and sheet. Skin prep: Chlorhexidine; local anesthetic administered A antimicrobial bonded/coated triple lumen catheter was placed in the left internal jugular vein using the Seldinger technique.  Ultrasound was used to verify the patency of the vein and for real time needle guidance.  Evaluation Blood flow good Complications: No apparent complications Patient did tolerate procedure well. Chest X-ray ordered to verify placement.  CXR: pending.  Max FickleDouglas Katelinn Justice 05/01/2015, 5:31 PM

## 2015-05-01 NOTE — Progress Notes (Signed)
ANTIBIOTIC CONSULT NOTE - INITIAL  Pharmacy Consult for Primaxin Indication: Intra-abdominal Infection  No Known Allergies  Patient Measurements: Height: 5\' 2"  (157.5 cm) Weight: 146 lb 9.7 oz (66.5 kg) IBW/kg (Calculated) : 50.1 Adjusted Body Weight:   Vital Signs: Temp: 100.4 F (38 C) (01/04 1600) Temp Source: Axillary (01/04 1600) BP: 69/50 mmHg (01/04 1955) Pulse Rate: 108 (01/04 1855) Intake/Output from previous day: 01/03 0701 - 01/04 0700 In: 3375 [P.O.:100; I.V.:2925; IV Piggyback:350] Out: 1340 [Urine:1340] Intake/Output from this shift:    Labs:  Recent Labs  04/29/15 0350 04/29/15 1456 04/30/15 0336 05/01/15 0340  WBC 16.3*  --  20.6* 17.8*  HGB 14.0  --  11.7* 11.2*  PLT 335  --  265 256  CREATININE 2.25* 2.84* 2.69* 2.39*   Estimated Creatinine Clearance: 20.7 mL/min (by C-G formula based on Cr of 2.39). No results for input(s): VANCOTROUGH, VANCOPEAK, VANCORANDOM, GENTTROUGH, GENTPEAK, GENTRANDOM, TOBRATROUGH, TOBRAPEAK, TOBRARND, AMIKACINPEAK, AMIKACINTROU, AMIKACIN in the last 72 hours.   Microbiology: Recent Results (from the past 720 hour(s))  Blood culture (routine x 2)     Status: None (Preliminary result)   Collection Time: 04/28/15  1:50 PM  Result Value Ref Range Status   Specimen Description BLOOD BLOOD LEFT ARM  Final   Special Requests IN PEDIATRIC BOTTLE 1CC  Final   Culture   Final    NO GROWTH 3 DAYS Performed at [facility]    Report Status PENDING  Incomplete  Blood culture (routine x 2)     Status: None (Preliminary result)   Collection Time: 04/28/15  1:55 PM  Result Value Ref Range Status   Specimen Description BLOOD BLOOD RIGHT HAND  Final   Special Requests IN PEDIATRIC BOTTLE 1CC  Final   Culture   Final    NO GROWTH 3 DAYS Performed at [facility]    Report Status PENDING  Incomplete  MRSA PCR Screening     Status: None   Collection Time: 04/28/15  5:59 PM  Result Value Ref Range Status   MRSA by  PCR NEGATIVE NEGATIVE Final    Comment:        The GeneXpert MRSA Assay (FDA approved for NASAL specimens only), is one component of a comprehensive MRSA colonization surveillance program. It is not intended to diagnose MRSA infection nor to guide or monitor treatment for MRSA infections.   Urine culture     Status: None (Preliminary result)   Collection Time: 04/29/15 12:05 AM  Result Value Ref Range Status   Specimen Description URINE, CLEAN CATCH  Final   Special Requests NONE  Final   Culture   Final    >=100,000 COLONIES/mL GRAM NEGATIVE RODS IDENTIFICATION AND SUSCEPTIBILITIES TO FOLLOW Performed at [facility]    Report Status PENDING  Incomplete    Medical History: Past Medical History  Diagnosis Date  . Olivopontocerebellar atrophy (HCC)     Mgmt Dr Vickey Huger  . Diabetes mellitus     Type II. Insulin dependent. Diabetic nephropathy.    Assessment: 40 yoF admitted with severe acute pancreatitis currently on ceftriaxone and flagyl for SIRS. CT 1/4 concerning for necrosis of pancreas, cannot be confirmed without IV contrast however pt with AKI.  Pharmacy consulted to broaden coverage to primaxin and continue flagyl.    Anti-infectives 1/1 >> Primaxin  >> 1/3 1/3 >> Rocephin >> 1/4 1/3 >> Flagyl >>  1/4 >> Primaxin >>  Vitals/Labs WBC: Elevated 17.8K Tm24h: 100.6 Renal: SCr elevated, improved the  last few days, CrCl ~21 (N26)  Cultures 1/1 bloodx2: NGTD 1/2 urine: >100K GNRs 1/1 MRSA PCR negative   Goal of Therapy:  Eradication of infection  Plan:  Primaxin 250mg  IV q8h F/u renal fxn, cultures, clinical course  Haynes Hoehnolleen Inga Noller, PharmD, BCPS 05/01/2015, 8:24 PM  Pager: [phone]

## 2015-05-01 NOTE — Progress Notes (Signed)
eLink Physician-Brief Progress Note Patient Name: Victoria Morrow DOB: 09/27/1950 MRN: [id number]   Date of Service  05/01/2015  HPI/Events of Note  Patient hypotensive. Given 250ccLR. Pancreatic necrosis on CT abdomen.  eICU Interventions  Awaiting CVP. Restarting Primaxin. Stopping Rocephin. Awaiting ABG.     Intervention Category Major Interventions: Sepsis - evaluation and management  Lawanda CousinsJennings Nestor 05/01/2015, 8:04 PM

## 2015-05-01 NOTE — Progress Notes (Signed)
Initial Nutrition Assessment  DOCUMENTATION CODES:   Not applicable  INTERVENTION:  -Diet Advancement per MD -RD to continue to monitor for needs    NUTRITION DIAGNOSIS:   Inadequate oral intake related to inability to eat as evidenced by NPO status.  GOAL:   Patient will meet greater than or equal to 90% of their needs  MONITOR:   Diet advancement, Vent status, Labs, I & O's, Skin  REASON FOR ASSESSMENT:   Ventilator    ASSESSMENT:   Victoria Morrow is a 65 y.o. female with a past medical history of type 2 diabetes on insulin, chronic kidney disease of unknown stage, hyperlipidemia, who was in her usual state of health till earlier today when she started developing nausea followed by vomiting. She also started having upper abdominal pain. Symptoms got worse and she decided to come into the hospital for further evaluation. She denies having had similar symptoms in the past. Pain is located in the upper abdomen without any radiation. It's achy pain 10 out of 10 in intensity. No aggravating or relieving factors. No known precipitant factors. Denies any recent antibiotic use. No sick contacts. No recent hospitalization. Denies any fever but has had chills. Denies any blood in the emesis or in the stool.  RD was at bedside when pt was intubated. Pt became hypoxic, was intubated, currently sedated with fentanyl and versed.  MV: 11.4 L/min Temp (24hrs), Avg:98.1 F (36.7 C), Min:97.7 F (36.5 C), Max:98.8 F (37.1 C)  Propofol: None  Family was not at bedside during time of visit/intubation. Pt presented with acute pancreatitis related to gallstones and possible sepsis. She is also an insulin dependent diabetic Per chart pt developed n/v and upper ab pain prior to admission. During intubation pt vomited large amounts of green fluid, though pt had been NPO for 4 days.   Drips: Neosynephrine  Labs: CBGs 115 -161, Na 149, Cl 117, BUN 39, Cr 2.39, EGFR 20 Medications  reviewed.  With current abdominal outlook and pancreatitis, nutrition support via OG is not a viable intervention. Continue to monitor for needs, possible TPN?  Diet Order:  Diet NPO time specified  Skin:  Reviewed, no issues  Last BM:  1/2  Height:   Ht Readings from Last 1 Encounters:  05/01/15 5' 2"  (1.575 m)    Weight:   Wt Readings from Last 1 Encounters:  05/01/15 146 lb 9.7 oz (66.5 kg)    Ideal Body Weight:  50 kg  BMI:  Body mass index is 26.81 kg/(m^2).  Estimated Nutritional Needs:   Kcal:  1370 calories  Protein:  80-100 grams  Fluid:  Per MD  EDUCATION NEEDS:   No education needs identified at this time  Satira Anis. Hortence Charter, MS, RD LDN After Hours/Weekend Pager [phone]

## 2015-05-01 NOTE — Progress Notes (Addendum)
   05/01/15 1200  Clinical Encounter Type  Visited With Family;Patient and family together  Visit Type Critical Care;Psychological support;Spiritual support;Social support;Initial  Referral From Nurse  Consult/Referral To Chaplain;Nurse  Spiritual Encounters  Spiritual Needs Emotional;Prayer    Chaplain consulted due to emergent intubation.  Victoria Morrow (pt) spouse, Victoria Morrow, present at bedside.   Providing emotional and spiritual support, prayers with Victoria HatchAnn and Victoria Huaavid.    Victoria Morrow expressed his fear that Victoria Morrow would not want to be on ventilator.  Reported Victoria Morrow had expressed on several occasions that she would not want this.  Victoria Morrow fears this is what she is attempting to communicate to him.   Victoria Huaavid and Victoria Morrow would benefit from further conversation with critical care about trajectory of care / length of intubation.    Chaplain reported [name] concern to nursing.     [name] concern is connected to the death of their son, Victoria Morrow, 8 years prior.  Victoria Morrow was a frequent sickle cell patient at [facility].  [place] in [place] was hospitalized.  [name] expressed his wish not to be on a ventilator and he died after a terminal extubation. Chaplain provided grief support.     Victoria Huaavid and Victoria Morrow are relying on their faith tradition for coping.  He is at bedside encouraging her to rest and pray.    [name], [name] MD[facility] [phone]

## 2015-05-01 NOTE — Progress Notes (Signed)
Patient currently on phenylephrine infusion.  Discussed with RN and will change to quad strength.    Victoria Morrow, PharmD, BCPS 05/01/2015, 7:30 PM  Pager: [phone]

## 2015-05-01 NOTE — Progress Notes (Signed)
Albuterol Q4 treatments changed to Xopenex Q4 due to increased patient HR of 150's.

## 2015-05-01 NOTE — Progress Notes (Signed)
RT called to the bedside to assist in intubating patient. Patient was on NRB and appeared labored. Victoria SimmondsPete Babcock, NP intubated patient with 7.5 ETT and 23 @ lip. Placement was verified by color change, BBS, chest rise, and CXR. Patient was initially placed on PC for recruitment and changed to PRVC 400/30/+14/100%. PEEP was decrease to +8 per NP and will be increased to +14 when patient BP has improved. Patient is currently comfortable on current settings and will follow ARDS protocol per NP. RT will continue to monitor patient.

## 2015-05-01 NOTE — Progress Notes (Signed)
Patient transported on Vent at 100% to CT. Patient had no complications noted during transport to and from CT. Vitals stable. Patient returned to room and comfortable. RT will continue to monitor patient.

## 2015-05-01 NOTE — Procedures (Signed)
Intubation Procedure Note Victoria Morrow [id number] 1950/11/15  Procedure: Intubation Indications: Respiratory insufficiency  Procedure Details Consent: Unable to obtain consent because of emergent medical necessity. Time Out: Verified patient identification, verified procedure, site/side was marked, verified correct patient position, special equipment/implants available, medications/allergies/relevent history reviewed, required imaging and test results available.  Performed  Maximum sterile technique was used including antiseptics, cap, gloves, hand hygiene and mask.  MAC    Evaluation Hemodynamic Status: BP stable throughout; O2 sats: currently acceptable Patient's Current Condition: stable Complications: Complications of large amt of emisis prior to intubation w/ continued on going emisis during intubation process  Patient did tolerate procedure well. Chest X-ray ordered to verify placement.  CXR: tube position acceptable.   Shelby Mattocksete E Garlan Drewes 05/01/2015

## 2015-05-01 NOTE — Progress Notes (Signed)
PULMONARY / CRITICAL CARE MEDICINE   Name: Victoria Morrow MRN: [id number] DOB: 1950/07/21    ADMISSION DATE:  04/28/2015 CONSULTATION DATE: 1/2  REFERRING MD:  Rito EhrlichKrishnan  CHIEF COMPLAINT:  Hypotension and pancreatitis   BRIEF DESCRIPTION:   65 year old female admitted on 1/1 with severe acute pancreatitis believed to be related to gallstones but no CBD or pancreatic duct dilation on admission.  Takes ACE inhibitor baseline.  Has mild gall bladder wall thickening on ultrasound with 5mm stone in neck of gallbladder.   SUBJECTIVE:  Pain under better control Has not been out of bed Some confusion last night On oxygen now  VITAL SIGNS: BP 90/59 mmHg  Pulse 135  Temp(Src) 97.9 F (36.6 C) (Oral)  Resp 27  Ht 5\' 2"  (1.575 m)  Wt 146 lb 9.7 oz (66.5 kg)  BMI 26.81 kg/m2  SpO2 97%  HEMODYNAMICS:    VENTILATOR SETTINGS:    INTAKE / OUTPUT: I/O last 3 completed shifts: In: 4975 [P.O.:100; I.V.:4425; IV Piggyback:450] Out: 1925 [Urine:1925]  PHYSICAL EXAMINATION: General:  Resting in bed, no distress HENT: NCAT, OP clear PULM: CTA B, equal air entry CV: tachy, regular GI: BS infrequent, mildly distended, no tenderness to my exam today MSK: normal bulk, tone Neuro: A&Ox4, MAEW  LABS:  BMET  Recent Labs Lab 04/29/15 1456 04/30/15 0336 05/01/15 0340  NA 149* 148* 149*  K 4.0 3.3* 3.6  CL 119* 119* 117*  CO2 18* 19* 18*  BUN 48* 45* 39*  CREATININE 2.84* 2.69* 2.39*  GLUCOSE 192* 98 129*    Electrolytes  Recent Labs Lab 04/29/15 1456 04/30/15 0336 05/01/15 0340  CALCIUM 7.8* 7.4* 6.6*    CBC  Recent Labs Lab 04/29/15 0350 04/30/15 0336 05/01/15 0340  WBC 16.3* 20.6* 17.8*  HGB 14.0 11.7* 11.2*  HCT 41.1 34.7* 33.2*  PLT 335 265 256    Coag's  Recent Labs Lab 04/28/15 1820  APTT 27  INR 1.04    Sepsis Markers  Recent Labs Lab 04/28/15 1705 04/28/15 1820 04/28/15 2123  LATICACIDVEN 1.74 2.0 1.9  PROCALCITON  --  11.43  --      ABG No results for input(s): PHART, PCO2ART, PO2ART in the last 168 hours.  Liver Enzymes  Recent Labs Lab 04/29/15 0350 04/30/15 0336 05/01/15 0340  AST 94* 77* 44*  ALT 66* 45 28  ALKPHOS 65 53 55  BILITOT 1.1 1.2 1.0  ALBUMIN 3.2* 2.6* 2.3*    Cardiac Enzymes No results for input(s): TROPONINI, PROBNP in the last 168 hours.  Glucose  Recent Labs Lab 04/30/15 1152 04/30/15 1526 04/30/15 1939 05/01/15 0039 05/01/15 0502 05/01/15 0740  GLUCAP 113* 156* 161* 130* 137* 115*    Imaging No results found.   STUDIES:  CT abd 1/1 1. Upper abdominal edema, eccentric right. Centered about the pancreatic head, uncinate process, and neck. Favor pancreatitis. Correlate with pancreatic enzymes (pending). If these are abnormal, less likely differential consideration would include peptic ulcer Disease. 2. Cholelithiasis without specific evidence of cholecystitis or biliary duct dilatation. 3. Trace perihepatic ascites, likely secondary. 4. Small hiatal hernia. 5. Pelvic floor laxity. 6. Renal atrophy. US abd 1/1: 1. Gallbladder wall thickening, with mild pericholecystic fluid.This is nonspecific in the presence of the patient's known pancreatitis. Stones noted within the gallbladder, with a 5 mm stone lodged at the neck of the gallbladder. Mild acute cholecystitis cannot be entirely excluded, but is considered less likely. 2. No evidence for bile duct obstruction. The patient's apparent duodenal diverticulum  is not well characterized on ultrasound.  CULTURES: BCX2 1/1>>> UC 1/1>>>  ANTIBIOTICS: Imipenem 1/1>>> 1/3 Ceftriaxone 1/3 >  Flagyl 1/3 >   SIGNIFICANT EVENTS:   LINES/TUBES:   DISCUSSION: 65 year old female admitted severe acute pancreatitis, presumably gallstone related but no obstructing stone seen on imaging aside from one in neck of gallbladder.  On ACE so drug related is possible as well.  Also with mild gallbladder wall thickening, but no clear  cholecystitis.  Vitals consistent with severe acute pancreatitis.  Possibly UTI, had contaminated urine specimen on admission.    ASSESSMENT / PLAN:  PULMONARY A: Tachypnea, now with some hypoxemia; at risk for ARDS with pancreatitis, may be volume overloaded P:   O2 as needed for O2 saturation > 92% Aspiration precautions CXR today IS q hour while awake  CARDIOVASCULAR A:  Sinus tach SIRS in setting of pancreatitis P:  Cont IVFs, decrease rate today SBP goal >95 Tele  RENAL A:   AKI, UOP OK > improving Hyperchloremia  Hypernatremia  P:   Continue LR, decrease rate to 75 I&O Monitor BMET and UOP Replace electrolytes as needed  GASTROINTESTINAL A:   Acute pancreatitis--> labs improving but still tachycardic Gall bladder wall thickening, GB stone but neg murphy's sign 1/3  P:   Sips and chips today Continue Pain control NPO still Cholecystectomy per gen surg   Continue antibiotic coverage today for GB  HEMATOLOGIC A:   No acute issues P:  Trend CBC Transfuse per ICU protocol  [place] heparin   INFECTIOUS A:   SIRS due to pancreatitis No UTI No cholecystitis  P:   F/u culture data Continue antibiotic coverage with ceftriaxone and metronidazole for today, will stop after 7 total days antibiotics   ENDOCRINE A:   Hyperglycemia P:   ICU glycemic control protocol   NEUROLOGIC A:   Pain P:   Dilaudid prn   FAMILY  - Updates: friend updated bedside 1/2  - Inter-disciplinary family meet or Palliative Care meeting due by:  Day 7  She remains critically ill with multi-organ failure but is slowly improving.  My cc time managing multiple databases of information, assessing status, reviewing imaging and labs and managing her medications is 32 minutes  Discussed with Dr. Laurey Morale, MD [facility] Pager: [phone] Cell: [phone] After 3pm or if no response, call [phone]    05/01/2015, 9:21 AM

## 2015-05-01 NOTE — Progress Notes (Signed)
ABG not able to be obtained. RT x 2 without success. RN aware.

## 2015-05-01 NOTE — Progress Notes (Signed)
[facility] PROGRESS NOTE  Victoria Morrow EAV:[id number] DOB: [birth year] DOA: 04/28/2015 PCP: Maryelizabeth Rowan, MD  HPI/Brief narrative 65 year old African-American female with a past medical history of type 2 diabetes on insulin, chronic kidney disease of unknown stage, presented with abdominal pain, nausea and vomiting. She was found to have severe pancreatitis thought to be biliary in origin. She was hospitalized for further management. Critical care medicine was consulted.  Assessment/Plan: Acute Biliary pancreatitis CT scan with evidence of gallstones. Patient denies any history of alcohol intake. Suspicion for gallstone pancreatitis. Ultrasound of the abdomen demonstrates GB thickening with pericholecystic fluid and 5mm stone lodged in neck of gallbladder and no Murphy's sign. CBD is normal size. General surgery is following. No clear evidence for acute cholecystitis. Triglyceride was 104. Pt remains tachycardic. Due to severity of her pancreatitis and significantly elevated WBC she was started on imipenem which will be continued for now. No necrotic changes in the pancreas noted on recent CT scan.  Sinus Tachycardia and tachypnea Most likely due to severe inflammation and acute illness. Continue supportive treatment as outlined. Appreciate critical care medicine input.  Metabolic acidosis/Lactic acidosis/possible sepsis Bicarbonate level is improved. Bicarbonate infusion was discontinued by critical care medicine. She is on lactated Ringer's. Anion gap continues to be normal. Lactic acid level has improved. Ketones were negative. She was experiencing diarrhea as well. Her metabolic acidosis is most likely multifactorial including renal failure, hypovolemia. Continue to monitor closely for now. Pro-calcitonin levels noted to be elevated. Continue imipenem. WBC is stable.  Possible acute on chronic kidney disease (unknown stage) with urinary retention Renal function appears to be improving  slowly. Urine output has picked up. Some degree of urinary retention was present. Foley catheter was placed by urology. Appreciate their assistance. Continue strict ins and outs. Continue IV fluids. No hydronephrosis noted on CT scan. Cr has improved somewhat  Transaminitis LFTs are slightly improved. Probably related to her acute pancreatitis and gallstones. Ultrasound of the right upper quadrant as above. General surgery is following. Hepatitis panel is negative. Continue to trend LFTs.  Diabetes mellitus type 2, poorly controlled, with chronic kidney disease Blood sugar is noted to be on the lower side this morning. Cut back on dose of Lantus. HbA1c is 7.7.   Acute Respiratory Failure Happened suddenly this AM Possibly secondary to aspiration PNA vs pneumonitis as pt witnessed to vomit just prior to event Patient was emergently intubated by CCM - now on ventilator   Code Status: Full Family Communication: Pt in room Disposition Plan: disp plans to be determined   Consultants:  PCCM  General Surgery  Urology  Procedures:    Antibiotics: Anti-infectives    Start     Dose/Rate Route Frequency Ordered Stop   05/02/15 1200  cefTRIAXone (ROCEPHIN) 1 g in dextrose 5 % 50 mL IVPB     1 g 100 mL/hr over 30 Minutes Intravenous Every 24 hours 05/01/15 1448     04/30/15 1100  metroNIDAZOLE (FLAGYL) IVPB 500 mg     500 mg 100 mL/hr over 60 Minutes Intravenous Every 12 hours 04/30/15 0951     04/30/15 1000  cefTRIAXone (ROCEPHIN) 1 g in dextrose 5 % 50 mL IVPB  Status:  Discontinued     1 g 100 mL/hr over 30 Minutes Intravenous Every 24 hours 04/30/15 0939 05/01/15 1205   04/30/15 0945  cefTRIAXone (ROCEPHIN) injection 1 g  Status:  Discontinued     1 g Intramuscular Every 24 hours 04/30/15 0930 04/30/15 7829  04/29/15 0200  imipenem-cilastatin (PRIMAXIN) 250 mg in sodium chloride 0.9 % 100 mL IVPB  Status:  Discontinued     250 mg 200 mL/hr over 30 Minutes Intravenous Every 8  hours 04/28/15 1812 04/30/15 0930   04/28/15 1830  imipenem-cilastatin (PRIMAXIN) 250 mg in sodium chloride 0.9 % 100 mL IVPB     250 mg 200 mL/hr over 30 Minutes Intravenous  Once 04/28/15 1812 04/28/15 1904      HPI/Subjective: This AM claimed to be feeling better generally  Objective: Filed Vitals:   05/01/15 1600 05/01/15 1645 05/01/15 1700 05/01/15 1715  BP:   83/53 85/61  Pulse:    43  Temp: 100.4 F (38 C)     TempSrc: Axillary     Resp: 37  35 41  Height:      Weight:      SpO2:  89%  93%    Intake/Output Summary (Last 24 hours) at 05/01/15 1757 Last data filed at 05/01/15 1500  Gross per 24 hour  Intake 3042.5 ml  Output    610 ml  Net 2432.5 ml   Filed Weights   04/28/15 1750 04/30/15 0446 05/01/15 0503  Weight: 60.6 kg (133 lb 9.6 oz) 64 kg (141 lb 1.5 oz) 66.5 kg (146 lb 9.7 oz)    Exam:   General:  Awake, in nad  Cardiovascular: tachycardic, s1, s2  Respiratory: slightly increased resp effort, no wheezing  Abdomen: obese, decreased BS  Musculoskeletal: perfused, no clubbing   Data Reviewed: Basic Metabolic Panel:  Recent Labs Lab 04/28/15 1820 04/29/15 0350 04/29/15 1456 04/30/15 0336 05/01/15 0340  NA 141 147* 149* 148* 149*  K 5.6* 4.1 4.0 3.3* 3.6  CL 117* 118* 119* 119* 117*  CO2 13* 16* 18* 19* 18*  GLUCOSE 362* 300* 192* 98 129*  BUN 37* 39* 48* 45* 39*  CREATININE 2.13* 2.25* 2.84* 2.69* 2.39*  CALCIUM 9.2 8.5* 7.8* 7.4* 6.6*   Liver Function Tests:  Recent Labs Lab 04/28/15 1225 04/29/15 0350 04/30/15 0336 05/01/15 0340  AST 124* 94* 77* 44*  ALT 67* 66* 45 28  ALKPHOS 78 65 53 55  BILITOT 1.0 1.1 1.2 1.0  PROT 7.3 6.3* 5.6* 5.4*  ALBUMIN 4.2 3.2* 2.6* 2.3*    Recent Labs Lab 04/28/15 1350 04/29/15 0350 04/30/15 0336 05/01/15 0340  LIPASE >3000* 2825* 2495* 364*   No results for input(s): AMMONIA in the last 168 hours. CBC:  Recent Labs Lab 04/28/15 1225 04/29/15 0350 04/30/15 0336 05/01/15 0340   WBC 33.2* 16.3* 20.6* 17.8*  NEUTROABS  --   --   --  15.3*  HGB 13.9 14.0 11.7* 11.2*  HCT 40.8 41.1 34.7* 33.2*  MCV 87.0 84.9 84.4 85.1  PLT 460* 335 265 256   Cardiac Enzymes: No results for input(s): CKTOTAL, CKMB, CKMBINDEX, TROPONINI in the last 168 hours. BNP (last 3 results) No results for input(s): BNP in the last 8760 hours.  ProBNP (last 3 results) No results for input(s): PROBNP in the last 8760 hours.  CBG:  Recent Labs Lab 05/01/15 0039 05/01/15 0502 05/01/15 0740 05/01/15 1242 05/01/15 1604  GLUCAP 130* 137* 115* 160* 177*    Recent Results (from the past 240 hour(s))  Blood culture (routine x 2)     Status: None (Preliminary result)   Collection Time: 04/28/15  1:50 PM  Result Value Ref Range Status   Specimen Description BLOOD BLOOD LEFT ARM  Final   Special Requests IN PEDIATRIC BOTTLE 1CC  Final   Culture   Final    NO GROWTH 3 DAYS Performed at [facility]    Report Status PENDING  Incomplete  Blood culture (routine x 2)     Status: None (Preliminary result)   Collection Time: 04/28/15  1:55 PM  Result Value Ref Range Status   Specimen Description BLOOD BLOOD RIGHT HAND  Final   Special Requests IN PEDIATRIC BOTTLE 1CC  Final   Culture   Final    NO GROWTH 3 DAYS Performed at [facility]    Report Status PENDING  Incomplete  MRSA PCR Screening     Status: None   Collection Time: 04/28/15  5:59 PM  Result Value Ref Range Status   MRSA by PCR NEGATIVE NEGATIVE Final    Comment:        The GeneXpert MRSA Assay (FDA approved for NASAL specimens only), is one component of a comprehensive MRSA colonization surveillance program. It is not intended to diagnose MRSA infection nor to guide or monitor treatment for MRSA infections.   Urine culture     Status: None (Preliminary result)   Collection Time: 04/29/15 12:05 AM  Result Value Ref Range Status   Specimen Description URINE, CLEAN CATCH  Final   Special Requests NONE   Final   Culture   Final    >=100,000 COLONIES/mL GRAM NEGATIVE RODS IDENTIFICATION AND SUSCEPTIBILITIES TO FOLLOW Performed at [facility]    Report Status PENDING  Incomplete     Studies: Dg Chest Port 1 View  05/01/2015  CLINICAL DATA:  Central line placement EXAM: PORTABLE CHEST 1 VIEW COMPARISON:  05/01/2015 FINDINGS: Endotracheal tube 1.3 cm from carina unchanged. Interval introduction of a LEFT central venous line with tip in the distal SVC versus proximal RIGHT atrium. No pneumothorax. Low lung volumes. IMPRESSION: 1. Introduction of a LEFT central venous line with tip in the distal SVC / cavoatrial junction. 2. Endotracheal tube unchanged in position. 3. Low lung volumes. Electronically Signed   By: Genevive Bi M.D.   On: 05/01/2015 17:50   Dg Chest Port 1 View  05/01/2015  CLINICAL DATA:  65 year old female with acute respiratory failure status post endotracheal tube placement. EXAM: PORTABLE CHEST 1 VIEW COMPARISON:  No priors. FINDINGS: An endotracheal tube is in place with tip 1.7 cm above the carina. Nasogastric tube extends into the distal third of the esophagus. Lung volumes are low. Bibasilar opacities may reflect areas of atelectasis and/or consolidation. Small left pleural effusion. No evidence of pulmonary edema. Heart size is normal. The patient is rotated to the right on today's exam, resulting in distortion of the mediastinal contours and reduced diagnostic sensitivity and specificity for mediastinal pathology. No pneumothorax. IMPRESSION: 1. Support apparatus, as above. Endotracheal tube could be withdrawn approximately 3 cm for more optimal placement, and a nasogastric tube could be advanced at least 10-15 cm for more optimal placement. 2. Low lung volumes with bibasilar areas of atelectasis and/or consolidation and small left pleural effusion. Electronically Signed   By: Trudie Reed M.D.   On: 05/01/2015 12:18    Scheduled Meds: . antiseptic oral rinse  7  mL Mouth Rinse q12n4p  . antiseptic oral rinse  7 mL Mouth Rinse QID  . [START ON 05/02/2015] cefTRIAXone (ROCEPHIN)  IV  1 g Intravenous Q24H  . chlorhexidine  15 mL Mouth Rinse BID  . chlorhexidine gluconate  15 mL Mouth Rinse BID  . heparin  5,000 Units Subcutaneous 3 times per day  .  insulin aspart  0-20 Units Subcutaneous 6 times per day  . insulin glargine  10 Units Subcutaneous QHS  . levalbuterol  0.63 mg Nebulization Q4H  . metronidazole  500 mg Intravenous Q12H  . midazolam      . midazolam  2 mg Intravenous Once  . pantoprazole (PROTONIX) IV  40 mg Intravenous Daily  . sodium chloride  3 mL Intravenous Q12H   Continuous Infusions: . dexmedetomidine 1.2 mcg/kg/hr (05/01/15 1729)  . fentaNYL infusion INTRAVENOUS 50 mcg/hr (05/01/15 1749)  . phenylephrine (NEO-SYNEPHRINE) Adult infusion 50 mcg/min (05/01/15 1752)  . sodium chloride 0.45 % 1,000 mL with sodium bicarbonate 75 mEq infusion      Principal Problem:   Acute pancreatitis Active Problems:   Gallstones   Transaminitis   DM type 2 (diabetes mellitus, type 2) (HCC)   Hyperlipidemia   CKD (chronic kidney disease)   ARF (acute renal failure) (HCC)   Acute respiratory failure with hypoxia (HCC)   Aspiration pneumonia due to vomit (HCC)   Acute on chronic respiratory failure (HCC)   Pancreatitis, acute    CHIU, STEPHEN K  [facility] Pager [phone]. If 7PM-7AM, please contact night-coverage at [email or web address], password [facility] 05/01/2015, 5:57 PM  LOS: 3 days

## 2015-05-02 ENCOUNTER — Inpatient Hospital Stay (HOSPITAL_COMMUNITY): Payer: Commercial Managed Care - HMO

## 2015-05-02 DIAGNOSIS — J690 Pneumonitis due to inhalation of food and vomit: Secondary | ICD-10-CM

## 2015-05-02 LAB — BASIC METABOLIC PANEL
ANION GAP: 11 (ref 5–15)
Anion gap: 12 (ref 5–15)
BUN: 41 mg/dL — ABNORMAL HIGH (ref 6–20)
BUN: 36 mg/dL — AB (ref 6–20)
CHLORIDE: 116 mmol/L — AB (ref 101–111)
CO2: 22 mmol/L (ref 22–32)
CO2: 22 mmol/L (ref 22–32)
CREATININE: 2.33 mg/dL — AB (ref 0.44–1.00)
Calcium: 6 mg/dL — CL (ref 8.9–10.3)
Calcium: 6.6 mg/dL — ABNORMAL LOW (ref 8.9–10.3)
Chloride: 116 mmol/L — ABNORMAL HIGH (ref 101–111)
Creatinine, Ser: 2.44 mg/dL — ABNORMAL HIGH (ref 0.44–1.00)
GFR calc Af Amer: 23 mL/min — ABNORMAL LOW (ref >60)
GFR, EST AFRICAN AMERICAN: 24 mL/min — AB (ref >60)
GFR, EST NON AFRICAN AMERICAN: 20 mL/min — AB (ref >60)
GFR, EST NON AFRICAN AMERICAN: 21 mL/min — AB (ref >60)
GLUCOSE: 166 mg/dL — AB (ref 65–99)
Glucose, Bld: 163 mg/dL — ABNORMAL HIGH (ref 65–99)
POTASSIUM: 3.6 mmol/L (ref 3.5–5.1)
Potassium: 3.3 mmol/L — ABNORMAL LOW (ref 3.5–5.1)
SODIUM: 149 mmol/L — AB (ref 135–145)
SODIUM: 150 mmol/L — AB (ref 135–145)

## 2015-05-02 LAB — CBC
HCT: 29.6 % — ABNORMAL LOW (ref 36.0–46.0)
Hemoglobin: 9.9 g/dL — ABNORMAL LOW (ref 12.0–15.0)
MCH: 28.9 pg (ref 26.0–34.0)
MCHC: 33.4 g/dL (ref 30.0–36.0)
MCV: 86.3 fL (ref 78.0–100.0)
Platelets: 282 10*3/uL (ref 150–400)
RBC: 3.43 MIL/uL — ABNORMAL LOW (ref 3.87–5.11)
RDW: 13.4 % (ref 11.5–15.5)
WBC: 13.8 10*3/uL — ABNORMAL HIGH (ref 4.0–10.5)

## 2015-05-02 LAB — URINE CULTURE

## 2015-05-02 LAB — BLOOD GAS, ARTERIAL
Acid-base deficit: 3.7 mmol/L — ABNORMAL HIGH (ref 0.0–2.0)
BICARBONATE: 19 meq/L — AB (ref 20.0–24.0)
Drawn by: 308601
FIO2: 0.4
LHR: 20 {breaths}/min
O2 Saturation: 94.8 %
PEEP: 8 cmH2O
PO2 ART: 79.9 mmHg — AB (ref 80.0–100.0)
Patient temperature: 98.6
TCO2: 17.4 mmol/L (ref 0–100)
VT: 400 mL
pCO2 arterial: 27.8 mmHg — ABNORMAL LOW (ref 35.0–45.0)
pH, Arterial: 7.448 (ref 7.350–7.450)

## 2015-05-02 LAB — GLUCOSE, CAPILLARY
GLUCOSE-CAPILLARY: 104 mg/dL — AB (ref 65–99)
GLUCOSE-CAPILLARY: 110 mg/dL — AB (ref 65–99)
Glucose-Capillary: 109 mg/dL — ABNORMAL HIGH (ref 65–99)
Glucose-Capillary: 117 mg/dL — ABNORMAL HIGH (ref 65–99)
Glucose-Capillary: 129 mg/dL — ABNORMAL HIGH (ref 65–99)
Glucose-Capillary: 136 mg/dL — ABNORMAL HIGH (ref 65–99)

## 2015-05-02 MED ORDER — CHLORHEXIDINE GLUCONATE 0.12 % MT SOLN: 15.0000 mL | Freq: Two times a day (BID) | OROMUCOSAL | Status: DC

## 2015-05-02 MED ORDER — SODIUM CHLORIDE 0.9 % IV SOLN
500.0000 mg | Freq: Two times a day (BID) | INTRAVENOUS | Status: DC
Start: 2015-05-02 — End: 2015-05-08
  Administered 2015-05-02 – 2015-05-08 (×14): 500 mg via INTRAVENOUS
  Filled 2015-05-02 (×18): qty 0.5

## 2015-05-02 MED ORDER — ONDANSETRON HCL 4 MG/2ML IJ SOLN
4.0000 mg | Freq: Four times a day (QID) | INTRAMUSCULAR | Status: DC | PRN
  Administered 2015-05-03 – 2015-05-04 (×3): 4 mg via INTRAVENOUS
  Filled 2015-05-02 (×3): qty 2

## 2015-05-02 MED ORDER — CETYLPYRIDINIUM CHLORIDE 0.05 % MT LIQD
7.0000 mL | Freq: Two times a day (BID) | OROMUCOSAL | Status: DC
  Administered 2015-05-02 (×2): 7 mL via OROMUCOSAL

## 2015-05-02 MED ORDER — CETYLPYRIDINIUM CHLORIDE 0.05 % MT LIQD
7.0000 mL | Freq: Two times a day (BID) | OROMUCOSAL | Status: DC
  Administered 2015-05-02 – 2015-05-04 (×4): 7 mL via OROMUCOSAL

## 2015-05-02 MED ORDER — LIP MEDEX EX OINT
TOPICAL_OINTMENT | CUTANEOUS | Status: AC
  Administered 2015-05-02: 18:00:00
  Filled 2015-05-02: qty 7

## 2015-05-02 MED ORDER — HYDRALAZINE HCL 20 MG/ML IJ SOLN: 20.0000 mg | INTRAMUSCULAR | Status: DC | PRN

## 2015-05-02 MED ORDER — SODIUM CHLORIDE 0.9 % IV SOLN
2.0000 g | Freq: Once | INTRAVENOUS | Status: AC
  Administered 2015-05-02: 2 g via INTRAVENOUS
  Filled 2015-05-02: qty 20

## 2015-05-02 MED ORDER — FENTANYL CITRATE (PF) 100 MCG/2ML IJ SOLN
12.5000 ug | INTRAMUSCULAR | Status: DC | PRN
  Administered 2015-05-02: 12.5 ug via INTRAVENOUS
  Administered 2015-05-02 – 2015-05-04 (×8): 25 ug via INTRAVENOUS
  Filled 2015-05-02 (×9): qty 2

## 2015-05-02 MED ORDER — FAMOTIDINE IN NACL 20-0.9 MG/50ML-% IV SOLN
20.0000 mg | INTRAVENOUS | Status: DC
  Administered 2015-05-02 – 2015-05-22 (×21): 20 mg via INTRAVENOUS
  Filled 2015-05-02 (×21): qty 50

## 2015-05-02 MED ORDER — SODIUM CHLORIDE 0.9 % IV SOLN
1.0000 g | Freq: Once | INTRAVENOUS | Status: AC
  Administered 2015-05-02: 1 g via INTRAVENOUS
  Filled 2015-05-02: qty 10

## 2015-05-02 MED ORDER — CETYLPYRIDINIUM CHLORIDE 0.05 % MT LIQD: 7.0000 mL | Freq: Two times a day (BID) | OROMUCOSAL | Status: DC

## 2015-05-02 NOTE — Progress Notes (Signed)
Subjective: Sedated on the Vent, but wakes up easily, very anxious, has mittens in place.    Objective: Vital signs in last 24 hours: Temp:  [99.3 F (37.4 C)-101.3 F (38.5 C)] 101.3 F (38.5 C) (01/05 0400) Pulse Rate:  [25-165] 96 (01/05 0359) Resp:  [20-44] 21 (01/05 0700) BP: (49-193)/(27-103) 106/73 mmHg (01/05 0700) SpO2:  [16 %-100 %] 99 % (01/05 0412) FiO2 (%):  [40 %-100 %] 40 % (01/05 0412) Weight:  [70.5 kg (155 lb 6.8 oz)] 70.5 kg (155 lb 6.8 oz) (01/05 0400) Last BM Date: 05/01/15 400 NG 2000 urine 3792 IV yesterday Febrile, tem up to 101 range; tachycardia is better BP stable on Vent Na 149, creatinine is stable Calcium is low glucose up. CXR:  Lung volumes with persistent right base infiltrate and/or atelectasis and new left base infiltrate and/or atelectasis.   CT without contrast last PM:  Progressive pancreatitis with edema and loss of normal pancreatic density within the pancreatic body and head. Findings are concerning for necrosis of the pancreas. This cannot be confirmed without IV contrast.   Interval increase in the fluid along the LEFT and RIGHT anterior pararenal spaces without organized fluid collections.  Several small gallstones within the gallbladder without evidence of cholecystitis.  Dilatation of the proximal small bowel with poor progression of contrast is most consistent pancreatitis associated ileus.  Marked increase in bilateral pleural effusions and bibasilar atelectasis.   Intake/Output from previous day: 01/04 0701 - 01/05 0700 In: 3792.4 [I.V.:3332.4; IV Piggyback:370] Out: 2400 [Urine:2000; Emesis/NG output:400] Intake/Output this shift: Total I/O In: -  Out: 350 [Urine:350]  General appearance: alert and wakes up easily, doesn't want to be examined when she wakes up.  GI: while she was asleep, abdome distended, no Bowel sounds, When she woke up she didn't want to be examined, I could not tell if it was anxiety, or discomfort with  the exam.  Lab Results:   Recent Labs  04/30/15 0336 05/01/15 0340  WBC 20.6* 17.8*  HGB 11.7* 11.2*  HCT 34.7* 33.2*  PLT 265 256    BMET  Recent Labs  05/01/15 0340 05/02/15 0430  NA 149* 149*  K 3.6 3.6  CL 117* 116*  CO2 18* 22  GLUCOSE 129* 166*  BUN 39* 41*  CREATININE 2.39* 2.44*  CALCIUM 6.6* 6.0*   PT/INR No results for input(s): LABPROT, INR in the last 72 hours.   Recent Labs Lab 04/28/15 1225 04/29/15 0350 04/30/15 0336 05/01/15 0340  AST 124* 94* 77* 44*  ALT 67* 66* 45 28  ALKPHOS 78 65 53 55  BILITOT 1.0 1.1 1.2 1.0  PROT 7.3 6.3* 5.6* 5.4*  ALBUMIN 4.2 3.2* 2.6* 2.3*     Lipase     Component Value Date/Time   LIPASE 364* 05/01/2015 0340     Studies/Results: Ct Abdomen Pelvis Wo Contrast  05/01/2015  CLINICAL DATA:  Acute pancreatitis.  Cholelithiasis. EXAM: CT ABDOMEN AND PELVIS WITHOUT CONTRAST TECHNIQUE: Multidetector CT imaging of the abdomen and pelvis was performed following the standard protocol without IV contrast. COMPARISON:  CT 04/28/2015, ultrasound 04/28/2015 FINDINGS: Lower chest: Interval increase in bilateral pleural effusions with bibasilar with passive atelectasis and air bronchograms. No pericardial fluid. Hepatobiliary: The no focal hepatic lesions noncontrast exam. No biliary duct dilatation is evident on this noncontrast exam. Gallbladder is not distended. There several small gallstones noted measuring approximately 4 mm each. The common bile duct is difficult define does not appear distended. Pancreas: There is a decreased attenuation  in the pancreas head and body as well as increase in pancreatic edema through the pancreatic head and body. The tail of the pancreas (image 29, series 2) is normal density without edema. The body of the pancreas measures 3.8 cm (image 30, series )2 increased in thickness from 3.2 cm comparison CT. There is fluid along the LEFT and RIGHT anterior para renal space decreased prior. No organized  fluid collections present. Spleen: Normal spleen Adrenals/urinary tract: Adrenal glands are normal. Kidneys appear atrophic. Foley catheter within a collapsed bladder. Stomach/Bowel: The stomach is normal. NG tube in the distal esophagus. 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 No complication. There is no evidence of leakage of oral contrast through the duodenum. The proximal small bowel is dilated up to 5 cm. There is poor egress of the oral contrast through the dilated small bowel which is fluid-filled. Favor these findings represent ileus associated with pancreatitis. Colon is collapsed. Small amount of contrast with the rectum related to prior CT scan on 04/28/2015. No intraperitoneal free air. Vascular/Lymphatic: Abdominal aorta is normal caliber with atherosclerotic calcification. There is no retroperitoneal or periportal lymphadenopathy. No pelvic lymphadenopathy. Reproductive: Uterus and ovaries are normal. Other: No free fluid. Musculoskeletal: No aggressive osseous lesion. IMPRESSION: 1. Progressive pancreatitis with edema and loss of normal pancreatic density within the pancreatic body and head. Findings are concerning for necrosis of the pancreas. This cannot be confirmed without IV contrast. 2. Interval increase in the fluid along the LEFT and RIGHT anterior pararenal spaces without organized fluid collections. 3. Several small gallstones within the gallbladder without evidence of cholecystitis. 4. Dilatation of the proximal small bowel with poor progression of contrast is most consistent pancreatitis associated ileus. 5. Marked increase in bilateral pleural effusions and bibasilar atelectasis. These results will be called to the ordering clinician or representative by the Radiologist Assistant, and communication documented in the PACS or zVision Dashboard. Electronically Signed   By: Genevive Bi M.D.   On: 05/01/2015 19:12   Dg Chest Port 1  View  05/02/2015  CLINICAL DATA:  Respiratory failure. EXAM: PORTABLE CHEST 1 VIEW COMPARISON:  05/01/2015 . FINDINGS: Endotracheal tube, NG tube, left IJ line in stable position. Persistent right base infiltrate and/or on. New left base infiltrate and/or atelectasis. Low lung volumes with atelectasis. No pleural effusion or pneumothorax. Heart size stable. IMPRESSION: 1. Lines and tubes in stable position. 2. Lung volumes with persistent right base infiltrate and/or atelectasis and new left base infiltrate and/or atelectasis Electronically Signed   By: Maisie Fus  Register   On: 05/02/2015 07:08   Dg Chest Port 1 View  05/01/2015  CLINICAL DATA:  Central line placement EXAM: PORTABLE CHEST 1 VIEW COMPARISON:  05/01/2015 FINDINGS: Endotracheal tube 1.3 cm from carina unchanged. Interval introduction of a LEFT central venous line with tip in the distal SVC versus proximal RIGHT atrium. No pneumothorax. Low lung volumes. IMPRESSION: 1. Introduction of a LEFT central venous line with tip in the distal SVC / cavoatrial junction. 2. Endotracheal tube unchanged in position. 3. Low lung volumes. Electronically Signed   By: Genevive Bi M.D.   On: 05/01/2015 17:50   Dg Chest Port 1 View  05/01/2015  CLINICAL DATA:  65 year old female with acute respiratory failure status post endotracheal tube placement. EXAM: PORTABLE CHEST 1 VIEW COMPARISON:  No priors. FINDINGS: An endotracheal tube is in place with tip 1.7 cm above the carina. Nasogastric tube extends into the distal third of the esophagus.  Lung volumes are low. Bibasilar opacities may reflect areas of atelectasis and/or consolidation. Small left pleural effusion. No evidence of pulmonary edema. Heart size is normal. The patient is rotated to the right on today's exam, resulting in distortion of the mediastinal contours and reduced diagnostic sensitivity and specificity for mediastinal pathology. No pneumothorax. IMPRESSION: 1. Support apparatus, as above.  Endotracheal tube could be withdrawn approximately 3 cm for more optimal placement, and a nasogastric tube could be advanced at least 10-15 cm for more optimal placement. 2. Low lung volumes with bibasilar areas of atelectasis and/or consolidation and small left pleural effusion. Electronically Signed   By: Trudie Reedaniel  Entrikin M.D.   On: 05/01/2015 12:18    Medications: . antiseptic oral rinse  7 mL Mouth Rinse q12n4p  . antiseptic oral rinse  7 mL Mouth Rinse QID  . calcium gluconate  1 g Intravenous Once  . chlorhexidine  15 mL Mouth Rinse BID  . chlorhexidine gluconate  15 mL Mouth Rinse BID  . famotidine (PEPCID) IV  20 mg Intravenous Q24H  . heparin  5,000 Units Subcutaneous 3 times per day  . insulin aspart  0-20 Units Subcutaneous 6 times per day  . insulin glargine  10 Units Subcutaneous QHS  . levalbuterol  0.63 mg Nebulization Q4H  . meropenem (MERREM) IV  500 mg Intravenous Q12H  . pantoprazole (PROTONIX) IV  40 mg Intravenous Daily  . sodium chloride  3 mL Intravenous Q12H    Assessment/Plan Gallstone pancreatitis with Systemic inflammatory response syndrome Acute respiratory failure with intubation 04/30/14 AODM type II Chronic renal disease creatinine is 2.39 Hyperlipidemia Olivopontocerebellar atrophy  Antibiotics: Day 5; day 1&2 imipenem-cilastatin , 2 days of ceftriaxone and flagyl, started on Meropenem 04/30/14 DVT: Heparin/SCD   Plan:  Ongoing medical management for worsening pancreatitis.    LOS: 4 days    Jacquelinne Speak 05/02/2015

## 2015-05-02 NOTE — Progress Notes (Signed)
Wasted in sink 240mL of fentanyl (10 mcg/mL).  Witnessed by Dallas BreedingPam West RN.  Erick Blinksuchman, Mylik Pro D, RN

## 2015-05-02 NOTE — Progress Notes (Signed)
PULMONARY / CRITICAL CARE MEDICINE   Name: Victoria Morrow MRN: [id number] DOB: [birth year]    ADMISSION DATE:  04/28/2015 CONSULTATION DATE: 1/2  REFERRING MD:  Rito EhrlichKrishnan  CHIEF COMPLAINT:  Hypotension and pancreatitis   BRIEF DESCRIPTION:   65 year old female admitted on 1/1 with severe acute pancreatitis believed to be related to gallstones but no CBD or pancreatic duct dilation on admission.  Takes ACE inhibitor baseline.  Has mild gall bladder wall thickening on ultrasound with 5mm stone in neck of gallbladder.   SUBJECTIVE:  Intubated yesterday On neosynephrine  VITAL SIGNS: BP 106/73 mmHg  Pulse 96  Temp(Src) 101.3 F (38.5 C) (Axillary)  Resp 21  Ht 5\' 2"  (1.575 m)  Wt 155 lb 6.8 oz (70.5 kg)  BMI 28.42 kg/m2  SpO2 99%  HEMODYNAMICS: CVP:  [9 mmHg-10 mmHg] 9 mmHg  VENTILATOR SETTINGS: Vent Mode:  [-] PRVC FiO2 (%):  [40 %-100 %] 40 % Set Rate:  [20 bmp-30 bmp] 20 bmp Vt Set:  [400 mL] 400 mL PEEP:  [8 cmH20-14 cmH20] 8 cmH20 Plateau Pressure:  [14 cmH20-24 cmH20] 14 cmH20  INTAKE / OUTPUT: I/O last 3 completed shifts: In: 5692.4 [I.V.:5132.4; Other:90; IV Piggyback:470] Out: 2910 [Urine:2510; Emesis/NG output:400]  PHYSICAL EXAMINATION: General:  Sedated heavily on vent HENT: NCAT, ETT in place PULM: CTA B, vent supported breaths CV: RRR, nomgr GI: Belly soft, nontender MSK: normal bulk and tone Neuro: heavily sedated, does not arouse to sternal rub  LABS:  BMET  Recent Labs Lab 04/30/15 0336 05/01/15 0340 05/02/15 0430  NA 148* 149* 149*  K 3.3* 3.6 3.6  CL 119* 117* 116*  CO2 19* 18* 22  BUN 45* 39* 41*  CREATININE 2.69* 2.39* 2.44*  GLUCOSE 98 129* 166*    Electrolytes  Recent Labs Lab 04/30/15 0336 05/01/15 0340 05/02/15 0430  CALCIUM 7.4* 6.6* 6.0*    CBC  Recent Labs Lab 04/29/15 0350 04/30/15 0336 05/01/15 0340  WBC 16.3* 20.6* 17.8*  HGB 14.0 11.7* 11.2*  HCT 41.1 34.7* 33.2*  PLT 335 265 256     Coag's  Recent Labs Lab 04/28/15 1820  APTT 27  INR 1.04    Sepsis Markers  Recent Labs Lab 04/28/15 1705 04/28/15 1820 04/28/15 2123  LATICACIDVEN 1.74 2.0 1.9  PROCALCITON  --  11.43  --     ABG  Recent Labs Lab 05/01/15 1547 05/01/15 2100 05/02/15 0435  PHART 7.431 7.429 7.448  PCO2ART 20.9* 27.6* 27.8*  PO2ART 203* 123* 79.9*    Liver Enzymes  Recent Labs Lab 04/29/15 0350 04/30/15 0336 05/01/15 0340  AST 94* 77* 44*  ALT 66* 45 28  ALKPHOS 65 53 55  BILITOT 1.1 1.2 1.0  ALBUMIN 3.2* 2.6* 2.3*    Cardiac Enzymes No results for input(s): TROPONINI, PROBNP in the last 168 hours.  Glucose  Recent Labs Lab 05/01/15 0740 05/01/15 1242 05/01/15 1604 05/01/15 1949 05/02/15 0003 05/02/15 0431  GLUCAP 115* 160* 177* 181* 109* 104*    Imaging  STUDIES:  CT abd 1/1 1. Upper abdominal edema, eccentric right. Centered about the pancreatic head, uncinate process, and neck. Favor pancreatitis. Correlate with pancreatic enzymes (pending). If these are abnormal, less likely differential consideration would include peptic ulcer Disease. 2. Cholelithiasis without specific evidence of cholecystitis or biliary duct dilatation. 3. Trace perihepatic ascites, likely secondary. 4. Small hiatal hernia. 5. Pelvic floor laxity. 6. Renal atrophy. US abd 1/1: 1. Gallbladder wall thickening, with mild pericholecystic fluid.This is nonspecific in the presence  of the patient's known pancreatitis. Stones noted within the gallbladder, with a 5 mm stone lodged at the neck of the gallbladder. Mild acute cholecystitis cannot be entirely excluded, but is considered less likely. 2. No evidence for bile duct obstruction. The patient's apparent duodenal diverticulum is not well characterized on ultrasound. CT abdomen/pelvis 1/5 > necrotic pancreas most likely, limited by lack of contrast, no cholecystitis, small stones, prox small bowel dilation, likely ileus, increased bilateral  atelectasis/effusions, some peritoneal fluid  1/5 CXR images personally reviewed> ETT in place, L IJ in place, bilateral atelectasis, effusions, no significant airspace disease  CULTURES: BCX2 1/1>>> UC 1/1>>> GNR  ANTIBIOTICS: Imipenem 1/1>>> 1/3 Ceftriaxone 1/3 >  Flagyl 1/3 > 1/5  SIGNIFICANT EVENTS: 1/4 vomited, aspirated, intubated, shock  LINES/TUBES: 1/4 ETT > 1/4 L IJ CVL >   DISCUSSION: 65 year old female admitted severe acute pancreatitis, presumably gallstone related but no obstructing stone seen on imaging aside from one in neck of gallbladder.  Aspirated on 1/4, requiring intubation and central line placement.  CT abdomen on 1/4 shows likely pancreatic necrosis.  ASSESSMENT / PLAN:  PULMONARY A: Acute respiratory failure with hypoxemia P:   Full vent support Wean PEEP today Pulmonary toilette Prn xopenex  CARDIOVASCULAR A:  Mild shock, likely volume depelete P:  Cont IVFs CVP monitoring SBP goal >95 Tele  RENAL A:   AKI, UOP OK > improving Hyperchloremia  Hypernatremia  Hypocalcemia P:   Continue IVF at current rate I&O strict Replete Ca today Repeat BMET later today Monitor BMET and UOP Replace electrolytes as needed  GASTROINTESTINAL A:   Acute pancreatitis--> likely necrosis Gall bladder wall thickening, GB stone but neg Murphy's sign 1/3  P:   Sips and chips today Continue Pain control NPO still Cholecystectomy per gen surg    HEMATOLOGIC A:   No acute issues P:  Trend CBC Transfuse per ICU protocol  [place] heparin   INFECTIOUS A:   SIRS due to pancreatitis UTI ? No cholecystitis  P:   F/u culture data D/c imipenem D/c flagyl Continue meropenem for now, but will narrow based on urine culture; broad spectrum antibiotics won't change outcomes for severe pancreatitis, even with necrosis.  ENDOCRINE A:   Hyperglycemia P:   ICU glycemic control protocol   NEUROLOGIC A:   Sedation needs for vent synchrony P:    RASS score -1 Fentanyl prn precedex   FAMILY  - Updates: husband updated 1/4 PM  - Inter-disciplinary family meet or Palliative Care meeting due by:  Day 7  She remains critically ill with multi-organ failure but is slowly improving.  My cc time managing multiple databases of information, assessing status, reviewing imaging and labs and managing her medications is 35  minutes  Heber Moulton, MD [place] [facility] Pager: [phone] Cell: [phone] After 3pm or if no response, call [phone]    05/02/2015, 7:51 AM

## 2015-05-02 NOTE — Procedures (Signed)
Extubation Procedure Note  Patient Details:   Name: Victoria Morrow DOB: 1950/07/07 MRN: [id number]   Airway Documentation:     Evaluation  O2 sats: stable throughout Complications: No apparent complications Patient did tolerate procedure well. Bilateral Breath Sounds: Diminished, Clear Suctioning: Airway Yes  Suzan GaribaldiCraddock, Florean Hoobler Ipek 05/02/2015, 12:31 PM

## 2015-05-02 NOTE — Progress Notes (Signed)
eLink Physician-Brief Progress Note Patient Name: Victoria Morrow DOB: 04/20/1951 MRN: [id number]   Date of Service  05/02/2015  HPI/Events of Note  Possible pancreatitis noted on CT abdomen.   eICU Interventions  Will change abx to Merrem.      Intervention Category Major Interventions: Infection - evaluation and management  Shane Crutchradeep Helaman Mecca 05/02/2015, 12:25 AM

## 2015-05-02 NOTE — Progress Notes (Signed)
Vent removed from room. Pt extubated at noon. No distress noted at this time.

## 2015-05-02 NOTE — Progress Notes (Signed)
eLink Physician-Brief Progress Note Patient Name: Victoria Morrow DOB: Dec 07, 1950 MRN: [id number]   Date of Service  05/02/2015  HPI/Events of Note  Camera check on patient post extubation. Some mild abdominal pain. RR in 30s & BP labile. Suspect abdominal pain contributing to some rapid shallow breathing. Overall comfort & no acute distress.  eICU Interventions  Continue current plan of care.     Intervention Category Intermediate Interventions: Other:  Lawanda CousinsJennings Tykesha Konicki 05/02/2015, 7:10 PM

## 2015-05-02 NOTE — Progress Notes (Signed)
Date: May 02, 2015 Chart reviewed for concurrent status and case management needs. Will continue to follow patient for changes and needs: Rhonda Davis, RN, BSN, CCM   [phone] 

## 2015-05-02 NOTE — Progress Notes (Signed)
CRITICAL VALUE ALERT  Critical value received:  Ca+ 6.0  Date of notification:  05/02/2015  Time of notification:  05:10  Critical value read back:Yes.    Nurse who received alert:  Briseidy Spark D. Davell Beckstead  MD notified (1st page):  ELINK  Time of first page:  05:15  MD notified (2nd page): N/A  Time of second page: N/A  Responding MD:  Nicholos Johnsamachandran  Time MD responded:  05:15

## 2015-05-02 NOTE — Progress Notes (Signed)
ANTIBIOTIC CONSULT NOTE - INITIAL  Pharmacy Consult for Meropenem Indication: intra-abdominal infection  No Known Allergies  Patient Measurements: Height: 5\' 2"  (157.5 cm) Weight: 146 lb 9.7 oz (66.5 kg) IBW/kg (Calculated) : 50.1 Adjusted Body Weight:   Vital Signs: Temp: 101.2 F (38.4 C) (01/04 2332) Temp Source: Axillary (01/04 2332) BP: 131/85 mmHg (01/05 0000) Pulse Rate: 99 (01/04 2332) Intake/Output from previous day: 01/04 0701 - 01/05 0700 In: 2401.4 [I.V.:2131.4; IV Piggyback:250] Out: 725 [Urine:725] Intake/Output from this shift: Total I/O In: 1276.4 [I.V.:1056.4; Other:20; IV Piggyback:200] Out: 525 [Urine:525]  Labs:  Recent Labs  04/29/15 0350 04/29/15 1456 04/30/15 0336 05/01/15 0340  WBC 16.3*  --  20.6* 17.8*  HGB 14.0  --  11.7* 11.2*  PLT 335  --  265 256  CREATININE 2.25* 2.84* 2.69* 2.39*   Estimated Creatinine Clearance: 20.7 mL/min (by C-G formula based on Cr of 2.39). No results for input(s): VANCOTROUGH, VANCOPEAK, VANCORANDOM, GENTTROUGH, GENTPEAK, GENTRANDOM, TOBRATROUGH, TOBRAPEAK, TOBRARND, AMIKACINPEAK, AMIKACINTROU, AMIKACIN in the last 72 hours.   Microbiology: Recent Results (from the past 720 hour(s))  Blood culture (routine x 2)     Status: None (Preliminary result)   Collection Time: 04/28/15  1:50 PM  Result Value Ref Range Status   Specimen Description BLOOD BLOOD LEFT ARM  Final   Special Requests IN PEDIATRIC BOTTLE 1CC  Final   Culture   Final    NO GROWTH 3 DAYS Performed at [facility]    Report Status PENDING  Incomplete  Blood culture (routine x 2)     Status: None (Preliminary result)   Collection Time: 04/28/15  1:55 PM  Result Value Ref Range Status   Specimen Description BLOOD BLOOD RIGHT HAND  Final   Special Requests IN PEDIATRIC BOTTLE 1CC  Final   Culture   Final    NO GROWTH 3 DAYS Performed at [facility]    Report Status PENDING  Incomplete  MRSA PCR Screening     Status: None    Collection Time: 04/28/15  5:59 PM  Result Value Ref Range Status   MRSA by PCR NEGATIVE NEGATIVE Final    Comment:        The GeneXpert MRSA Assay (FDA approved for NASAL specimens only), is one component of a comprehensive MRSA colonization surveillance program. It is not intended to diagnose MRSA infection nor to guide or monitor treatment for MRSA infections.   Urine culture     Status: None (Preliminary result)   Collection Time: 04/29/15 12:05 AM  Result Value Ref Range Status   Specimen Description URINE, CLEAN CATCH  Final   Special Requests NONE  Final   Culture   Final    >=100,000 COLONIES/mL GRAM NEGATIVE RODS IDENTIFICATION AND SUSCEPTIBILITIES TO FOLLOW Performed at [facility]    Report Status PENDING  Incomplete    Medical History: Past Medical History  Diagnosis Date  . Olivopontocerebellar atrophy (HCC)     Mgmt Dr Vickey Huger  . Diabetes mellitus     Type II. Insulin dependent. Diabetic nephropathy.    Medications:  Anti-infectives    Start     Dose/Rate Route Frequency Ordered Stop   05/02/15 1200  cefTRIAXone (ROCEPHIN) 1 g in dextrose 5 % 50 mL IVPB  Status:  Discontinued     1 g 100 mL/hr over 30 Minutes Intravenous Every 24 hours 05/01/15 1448 05/01/15 2004   05/02/15 0600  imipenem-cilastatin (PRIMAXIN) 250 mg in sodium chloride 0.9 % 100 mL  IVPB  Status:  Discontinued     250 mg 200 mL/hr over 30 Minutes Intravenous 3 times per day 05/01/15 2025 05/02/15 0027   05/02/15 0030  meropenem (MERREM) 500 mg in sodium chloride 0.9 % 50 mL IVPB     500 mg 100 mL/hr over 30 Minutes Intravenous Every 12 hours 05/02/15 0027     05/01/15 2030  imipenem-cilastatin (PRIMAXIN) 250 mg in sodium chloride 0.9 % 100 mL IVPB     250 mg 200 mL/hr over 30 Minutes Intravenous NOW 05/01/15 2025 05/01/15 2110   04/30/15 1100  metroNIDAZOLE (FLAGYL) IVPB 500 mg     500 mg 100 mL/hr over 60 Minutes Intravenous Every 12 hours 04/30/15 0951     04/30/15 1000   cefTRIAXone (ROCEPHIN) 1 g in dextrose 5 % 50 mL IVPB  Status:  Discontinued     1 g 100 mL/hr over 30 Minutes Intravenous Every 24 hours 04/30/15 0939 05/01/15 1205   04/30/15 0945  cefTRIAXone (ROCEPHIN) injection 1 g  Status:  Discontinued     1 g Intramuscular Every 24 hours 04/30/15 0930 04/30/15 0938   04/29/15 0200  imipenem-cilastatin (PRIMAXIN) 250 mg in sodium chloride 0.9 % 100 mL IVPB  Status:  Discontinued     250 mg 200 mL/hr over 30 Minutes Intravenous Every 8 hours 04/28/15 1812 04/30/15 0930   04/28/15 1830  imipenem-cilastatin (PRIMAXIN) 250 mg in sodium chloride 0.9 % 100 mL IVPB     250 mg 200 mL/hr over 30 Minutes Intravenous  Once 04/28/15 1812 04/28/15 1904     Assessment: Patient already known to pharmacy with prior antibiotic dosing.  Pharmacy now asked to dose meropenem for intra-abdominal infection/possible pancreatitis.  Goal of Therapy:  Meropenem dosed based on patient weight and renal function   Plan:  Follow up culture results  Meropenem 500mg  iv q12hr  Aleene DavidsonGrimsley Jr, Osmani Kersten Crowford 05/02/2015,12:43 AM

## 2015-05-03 ENCOUNTER — Inpatient Hospital Stay (HOSPITAL_COMMUNITY): Payer: Commercial Managed Care - HMO

## 2015-05-03 DIAGNOSIS — N390 Urinary tract infection, site not specified: Secondary | ICD-10-CM

## 2015-05-03 DIAGNOSIS — R579 Shock, unspecified: Secondary | ICD-10-CM

## 2015-05-03 LAB — BLOOD GAS, ARTERIAL
Acid-base deficit: 8.8 mmol/L — ABNORMAL HIGH (ref 0.0–2.0)
BICARBONATE: 13.7 meq/L — AB (ref 20.0–24.0)
DRAWN BY: 441261
FIO2: 0.8
O2 SAT: 98.7 %
PCO2 ART: 20.9 mmHg — AB (ref 35.0–45.0)
PEEP: 10 cmH2O
PH ART: 7.431 (ref 7.350–7.450)
Patient temperature: 98.6
RATE: 30 resp/min
TCO2: 12.4 mmol/L (ref 0–100)
VT: 400 mL
pO2, Arterial: 203 mmHg — ABNORMAL HIGH (ref 80.0–100.0)

## 2015-05-03 LAB — CBC WITH DIFFERENTIAL/PLATELET
BASOS PCT: 0 %
Basophils Absolute: 0 10*3/uL (ref 0.0–0.1)
EOS ABS: 0 10*3/uL (ref 0.0–0.7)
EOS PCT: 0 %
HCT: 29 % — ABNORMAL LOW (ref 36.0–46.0)
Hemoglobin: 9.8 g/dL — ABNORMAL LOW (ref 12.0–15.0)
LYMPHS ABS: 2.6 10*3/uL (ref 0.7–4.0)
Lymphocytes Relative: 14 %
MCH: 28.5 pg (ref 26.0–34.0)
MCHC: 33.8 g/dL (ref 30.0–36.0)
MCV: 84.3 fL (ref 78.0–100.0)
MONO ABS: 1.7 10*3/uL — AB (ref 0.1–1.0)
MONOS PCT: 9 %
Neutro Abs: 14 10*3/uL — ABNORMAL HIGH (ref 1.7–7.7)
Neutrophils Relative %: 77 %
Platelets: 333 10*3/uL (ref 150–400)
RBC: 3.44 MIL/uL — ABNORMAL LOW (ref 3.87–5.11)
RDW: 13.2 % (ref 11.5–15.5)
WBC: 18.4 10*3/uL — ABNORMAL HIGH (ref 4.0–10.5)

## 2015-05-03 LAB — GLUCOSE, CAPILLARY
GLUCOSE-CAPILLARY: 214 mg/dL — AB (ref 65–99)
GLUCOSE-CAPILLARY: 207 mg/dL — AB (ref 65–99)
Glucose-Capillary: 136 mg/dL — ABNORMAL HIGH (ref 65–99)
Glucose-Capillary: 149 mg/dL — ABNORMAL HIGH (ref 65–99)
Glucose-Capillary: 158 mg/dL — ABNORMAL HIGH (ref 65–99)
Glucose-Capillary: 140 mg/dL — ABNORMAL HIGH (ref 65–99)
Glucose-Capillary: 195 mg/dL — ABNORMAL HIGH (ref 65–99)

## 2015-05-03 LAB — CULTURE, BLOOD (ROUTINE X 2)
Culture: NO GROWTH
Culture: NO GROWTH

## 2015-05-03 LAB — BASIC METABOLIC PANEL
ANION GAP: 11 (ref 5–15)
Anion gap: 14 (ref 5–15)
BUN: 38 mg/dL — AB (ref 6–20)
BUN: 37 mg/dL — AB (ref 6–20)
CALCIUM: 7 mg/dL — AB (ref 8.9–10.3)
CALCIUM: 6.9 mg/dL — AB (ref 8.9–10.3)
CHLORIDE: 113 mmol/L — AB (ref 101–111)
CO2: 23 mmol/L (ref 22–32)
CO2: 22 mmol/L (ref 22–32)
CREATININE: 2.33 mg/dL — AB (ref 0.44–1.00)
CREATININE: 2.22 mg/dL — AB (ref 0.44–1.00)
Chloride: 111 mmol/L (ref 101–111)
GFR calc Af Amer: 25 mL/min — ABNORMAL LOW (ref >60)
GFR calc non Af Amer: 21 mL/min — ABNORMAL LOW (ref >60)
GFR, EST AFRICAN AMERICAN: 24 mL/min — AB (ref >60)
GFR, EST NON AFRICAN AMERICAN: 22 mL/min — AB (ref >60)
GLUCOSE: 192 mg/dL — AB (ref 65–99)
GLUCOSE: 272 mg/dL — AB (ref 65–99)
Potassium: 2.8 mmol/L — ABNORMAL LOW (ref 3.5–5.1)
Potassium: 3.1 mmol/L — ABNORMAL LOW (ref 3.5–5.1)
Sodium: 150 mmol/L — ABNORMAL HIGH (ref 135–145)
Sodium: 144 mmol/L (ref 135–145)

## 2015-05-03 LAB — C DIFFICILE QUICK SCREEN W PCR REFLEX
C DIFFICILE (CDIFF) INTERP: NEGATIVE
C DIFFICILE (CDIFF) TOXIN: NEGATIVE
C DIFFICLE (CDIFF) ANTIGEN: NEGATIVE

## 2015-05-03 MED ORDER — POTASSIUM CHLORIDE 10 MEQ/50ML IV SOLN
10.0000 meq | INTRAVENOUS | Status: AC
  Administered 2015-05-03 (×6): 10 meq via INTRAVENOUS
  Filled 2015-05-03 (×6): qty 50

## 2015-05-03 MED ORDER — POTASSIUM CHLORIDE IN NACL 20-0.45 MEQ/L-% IV SOLN
INTRAVENOUS | Status: AC
  Administered 2015-05-03 – 2015-05-05 (×5): via INTRAVENOUS
  Filled 2015-05-03 (×9): qty 1000

## 2015-05-03 MED ORDER — VANCOMYCIN 50 MG/ML ORAL SOLUTION
500.0000 mg | Freq: Four times a day (QID) | ORAL | Status: DC
  Administered 2015-05-03 – 2015-05-04 (×4): 500 mg via ORAL
  Filled 2015-05-03 (×8): qty 10

## 2015-05-03 MED ORDER — BOOST / RESOURCE BREEZE PO LIQD
1.0000 | Freq: Three times a day (TID) | ORAL | Status: DC
  Administered 2015-05-03 (×2): 1 via ORAL

## 2015-05-03 MED ORDER — SODIUM CHLORIDE 0.9 % IV SOLN
1.0000 g | Freq: Once | INTRAVENOUS | Status: AC
  Administered 2015-05-03: 1 g via INTRAVENOUS
  Filled 2015-05-03: qty 10

## 2015-05-03 NOTE — Progress Notes (Signed)
eLink Physician-Brief Progress Note Patient Name: Victoria Morrow DOB: 31-Jul-1950 MRN: [id number]   Date of Service  05/03/2015  HPI/Events of Note  Diarrhea reported, started today. Her abx have been recently broadened. No fever, WBC has been decreasing. Will defer c diff testing at this time. If continues and unable to manage consider rectal tube, defer for now  eICU Interventions       Intervention Category Intermediate Interventions: Other:  Egidio Lofgren S. 05/03/2015, 1:29 AM

## 2015-05-03 NOTE — Progress Notes (Signed)
Nutrition Follow-up  DOCUMENTATION CODES:   Not applicable  INTERVENTION:  - Will order Boost Breeze TID, each supplement provides 250 kcal and 9 grams of protein - Diet advancement as medically feasible - RD will continue to monitor for needs  NUTRITION DIAGNOSIS:   Inadequate oral intake related to inability to eat as evidenced by NPO status. -ongoing despite advancement to CLD  GOAL:   Patient will meet greater than or equal to 90% of their needs -unmet  MONITOR:   Diet advancement, PO intake, Weight trends, Labs, Skin, I & O's  ASSESSMENT:   Victoria Morrow is a 65 y.o. female with a past medical history of type 2 diabetes on insulin, chronic kidney disease of unknown stage, hyperlipidemia, who was in her usual state of health till earlier today when she started developing nausea followed by vomiting. She also started having upper abdominal pain. Symptoms got worse and she decided to come into the hospital for further evaluation. She denies having had similar symptoms in the past. Pain is located in the upper abdomen without any radiation. It's achy pain 10 out of 10 in intensity. No aggravating or relieving factors. No known precipitant factors. Denies any recent antibiotic use. No sick contacts. No recent hospitalization. Denies any fever but has had chills. Denies any blood in the emesis or in the stool.  1/6 Pt was extubated and nutrition needs updated based on this; needs remain elevated due to pancreatitis. Pt very drowsy at time of RD visit; unable to obtain information from PTA. She denies consuming any liquids this AM. Not meeting needs. Will order clear liquid supplement and monitor for intakes and diet advancement. Medications reviewed. Labs reviewed; CBGs: 109-181 mg/dL, Na: 161150 mmol/L, K: 2.8 mmol/L, Cl: 113 mmol/L, BUN/creatinine elevated, Ca: 7 mg/dL, GFR: 24.   1/4 - RD was at bedside when pt was intubated. - Pt became hypoxic, was intubated, currently sedated with  fentanyl and versed. - MV: 11.4 L/min; Propofol: None - Family was not at bedside during time of visit/intubation. - Pt presented with acute pancreatitis related to gallstones and possible sepsis.  - She is also an insulin dependent diabetic - Per chart pt developed n/v and upper ab pain prior to admission.  - During intubation pt vomited large amounts of green fluid, though pt had been NPO for 4 days.   Diet Order:  Diet clear liquid Room service appropriate?: Yes; Fluid consistency:: Thin  Skin:  Reviewed, no issues  Last BM:  1/6  Height:   Ht Readings from Last 1 Encounters:  05/01/15 5\' 2"  (1.575 m)    Weight:   Wt Readings from Last 1 Encounters:  05/02/15 155 lb 6.8 oz (70.5 kg)    Ideal Body Weight:  50 kg  BMI:  Body mass index is 28.42 kg/(m^2).  Estimated Nutritional Needs:   Kcal:  1800-2000  Protein:  85-95 grams  Fluid:  2-2.2 L/day  EDUCATION NEEDS:   No education needs identified at this time     [name] Ismaeel Arvelo, RD, LDN Inpatient Clinical Dietitian Pager # [phone] After hours/weekend pager # [phone]

## 2015-05-03 NOTE — Progress Notes (Signed)
eLink Physician-Brief Progress Note Patient Name: Ave Filternn C Sharpe DOB: 10/28/1950 MRN: [id number]   Date of Service  05/03/2015  HPI/Events of Note    eICU Interventions  KCl given     Intervention Category Minor Interventions: Electrolytes abnormality - evaluation and management  Duong Haydel S. 05/03/2015, 6:35 AM

## 2015-05-03 NOTE — Progress Notes (Signed)
PULMONARY / CRITICAL CARE MEDICINE   Name: Victoria Morrow MRN: [id number] DOB: 01/31/1951    ADMISSION DATE:  04/28/2015 CONSULTATION DATE: 1/2  REFERRING MD:  Rito Ehrlich  CHIEF COMPLAINT:  Hypotension and pancreatitis   BRIEF DESCRIPTION:   65 year old female admitted on 1/1 with severe acute pancreatitis believed to be related to gallstones but no CBD or pancreatic duct dilation on admission.  Takes ACE inhibitor baseline.  Had mild gall bladder wall thickening on ultrasound with 5mm stone in neck of gallbladder.  Aspirated on 1/4, requiring intubation, extubated 1/5. Fever 1/5.   SUBJECTIVE:  Extubated yesterday Still on neosynephrine WBC 18K Diarrhea overnight Belly cramping Didn't reset well  VITAL SIGNS: BP 111/77 mmHg  Pulse 73  Temp(Src) 98.3 F (36.8 C) (Oral)  Resp 34  Ht 5\' 2"  (1.575 m)  Wt 70.5 kg (155 lb 6.8 oz)  BMI 28.42 kg/m2  SpO2 100%  HEMODYNAMICS: CVP:  [0 mmHg-9 mmHg] 9 mmHg  VENTILATOR SETTINGS: Vent Mode:  [-] PSV;CPAP FiO2 (%):  [40 %] 40 % PEEP:  [5 cmH20] 5 cmH20 Pressure Support:  [5 cmH20] 5 cmH20  INTAKE / OUTPUT: I/O last 3 completed shifts: In: 6711.4 [I.V.:5871.4; Other:210; IV Piggyback:630] Out: 4525 [Urine:4125; Emesis/NG output:400]  PHYSICAL EXAMINATION: General:  Awake, alert, conversant HENT: NCAT, ETT in place PULM: CTA B, normal effort CV: RRR, no mgr GI: Belly soft, mild diffuse tenderness, no RUQ tenderness MSK: normal bulk and tone Neuro: awake, alert, no distress  LABS:  BMET  Recent Labs Lab 05/02/15 0430 05/02/15 1710 05/03/15 0430  NA 149* 150* 150*  K 3.6 3.3* 2.8*  CL 116* 116* 113*  CO2 22 22 23   BUN 41* 36* 38*  CREATININE 2.44* 2.33* 2.33*  GLUCOSE 166* 163* 192*    Electrolytes  Recent Labs Lab 05/02/15 0430 05/02/15 1710 05/03/15 0430  CALCIUM 6.0* 6.6* 7.0*    CBC  Recent Labs Lab 05/01/15 0340 05/02/15 0925 05/03/15 0430  WBC 17.8* 13.8* 18.4*  HGB 11.2* 9.9* 9.8*  HCT  33.2* 29.6* 29.0*  PLT 256 282 333    Coag's  Recent Labs Lab 04/28/15 1820  APTT 27  INR 1.04    Sepsis Markers  Recent Labs Lab 04/28/15 1705 04/28/15 1820 04/28/15 2123  LATICACIDVEN 1.74 2.0 1.9  PROCALCITON  --  11.43  --     ABG  Recent Labs Lab 05/01/15 1547 05/01/15 2100 05/02/15 0435  PHART 7.431 7.429 7.448  PCO2ART 20.9* 27.6* 27.8*  PO2ART 203* 123* 79.9*    Liver Enzymes  Recent Labs Lab 04/29/15 0350 04/30/15 0336 05/01/15 0340  AST 94* 77* 44*  ALT 66* 45 28  ALKPHOS 65 53 55  BILITOT 1.1 1.2 1.0  ALBUMIN 3.2* 2.6* 2.3*    Cardiac Enzymes No results for input(s): TROPONINI, PROBNP in the last 168 hours.  Glucose  Recent Labs Lab 05/02/15 0735 05/02/15 1117 05/02/15 1540 05/02/15 2042 05/02/15 2318 05/03/15 0405  GLUCAP 136* 129* 117* 110* 136* 149*    Imaging  STUDIES:  CT abd 1/1 1. Upper abdominal edema, eccentric right. Centered about the pancreatic head, uncinate process, and neck. Favor pancreatitis. Correlate with pancreatic enzymes (pending). If these are abnormal, less likely differential consideration would include peptic ulcer Disease. 2. Cholelithiasis without specific evidence of cholecystitis or biliary duct dilatation. 3. Trace perihepatic ascites, likely secondary. 4. Small hiatal hernia. 5. Pelvic floor laxity. 6. Renal atrophy. [place] abd 1/1: 1. Gallbladder wall thickening, with mild pericholecystic fluid.This is nonspecific in  the presence of the patient's known pancreatitis. Stones noted within the gallbladder, with a 5 mm stone lodged at the neck of the gallbladder. Mild acute cholecystitis cannot be entirely excluded, but is considered less likely. 2. No evidence for bile duct obstruction. The patient's apparent duodenal diverticulum is not well characterized on ultrasound. CT abdomen/pelvis 1/5 > necrotic pancreas most likely, limited by lack of contrast, no cholecystitis, small stones, prox small bowel  dilation, likely ileus, increased bilateral atelectasis/effusions, some peritoneal fluid  1/6 CXR images personally reviewed> low lung volumes, L IJ in place, bilateral atelectasis, no significant airspace disease  CULTURES: BCX2 1/1>>> neg UC 1/1>>> Klebsiella, amp resistant BC x2 1/5 >  Resp cx 1/5 >  C diff 1/6 >   ANTIBIOTICS: Imipenem 1/1>>> 1/3 Ceftriaxone 1/3 > 1/4 Flagyl 1/3 > 1/5 Meropenem 1/4 >  Oral Vanc 1/6>   SIGNIFICANT EVENTS: 1/4 vomited, aspirated, intubated, shock 1/5 extubated  LINES/TUBES: 1/4 ETT > 1/5 1/4 L IJ CVL >   DISCUSSION: 65 year old female admitted severe acute pancreatitis, presumably gallstone related but no obstructing stone seen on imaging aside from one in neck of gallbladder.  Aspirated on 1/4, requiring intubation and central line placement.  CT abdomen on 1/4 shows likely pancreatic necrosis. Extubated 1/5, diarrhea overnight.  ASSESSMENT / PLAN:  PULMONARY A: Acute respiratory failure with hypoxemia > resolved P:   Incentive spirometry Out of bed Flutter prn Prn xopenex OK to start diet, sit up in chair  CARDIOVASCULAR A:  Shock> Still on neo but blood MAP > 70 Transient Afib 1/6 AM P:  CVP monitoring MAP goal > 55 Continue IVF Wean Neo Tele  RENAL A:   AKI, UOP OK > improving Hyperchloremia  Hypernatremia  Hypocalcemia Hypokalemia P:   Change IVF to 1/2 NS with 20mEq KCL Replete K Repeat BMET later today I&O strict Replete Ca today Monitor BMET and UOP   GASTROINTESTINAL A:   Acute pancreatitis--> likely necrosis Gall bladder wall thickening, GB stone but neg Murphy's sign 1/3 Ileus> improved Diarrhea  P:   Slowly advance to clears today, aspiration precautions Prn zofran Continue Pain control Cholecystectomy per gen surg     HEMATOLOGIC A:   No acute issues P:  Trend CBC Transfuse per ICU protocol  [place] heparin   INFECTIOUS A:   SIRS due to pancreatitis UTI> Klebiella WBC up 1/6,  diarrhea, multiple antibiotics, at risk for C diff P:   Check c diff F/u culture data Continue meropenem today, but if cultures negative 1/7 would narrow to cover klebsiella Add oral vanc until c diff ruled out  ENDOCRINE A:   Hyperglycemia P:   ICU glycemic control protocol   NEUROLOGIC A:   Pain control P:   Fentanyl prn   FAMILY  - Updates: husband updated 1/6 AM bedside  - Inter-disciplinary family meet or Palliative Care meeting due by:  Day 7  My cc time 35 minutes  Heber CarolinaBrent Jaely Silman, MD [place] [facility] Pager: [phone] Cell: [phone] After 3pm or if no response, call [phone]    05/03/2015, 9:17 AM

## 2015-05-03 NOTE — Progress Notes (Signed)
Subjective: Extubated, and pretty alert, but still has mittens on.  Her husband is with her.  She says she is doing and feels terrible.  Still having abdominal pain, and diarrhea now.    Objective: Vital signs in last 24 hours: Temp:  [97.5 F (36.4 C)-102 F (38.9 C)] 97.7 F (36.5 C) (01/06 0400) Pulse Rate:  [25-110] 73 (01/06 0630) Resp:  [23-42] 34 (01/06 0630) BP: (71-173)/(29-127) 111/77 mmHg (01/06 0630) SpO2:  [67 %-100 %] 100 % (01/06 0630) FiO2 (%):  [40 %] 40 % (01/05 1142) Last BM Date: 05/03/15 I/O 1694 positive 7 stools recorded Fever is better, broke around 3 PM yesterday and afebrile since that time Tachycardia is better, BP was down, but that is also better this AM Creatinine is up some but over all stable  WBC is still up at 18.4 K Intake/Output from previous day: 01/05 0701 - 01/06 0700 In: 4019.6 [I.V.:3589.6; IV Piggyback:310] Out: 2325 [Urine:2325] Intake/Output this shift:    General appearance: alert, cooperative and no distress GI: soft, somewhat distended, no bowel sounds.  Lab Results:   Recent Labs  05/02/15 0925 05/03/15 0430  WBC 13.8* 18.4*  HGB 9.9* 9.8*  HCT 29.6* 29.0*  PLT 282 333    BMET  Recent Labs  05/02/15 1710 05/03/15 0430  NA 150* 150*  K 3.3* 2.8*  CL 116* 113*  CO2 22 23  GLUCOSE 163* 192*  BUN 36* 38*  CREATININE 2.33* 2.33*  CALCIUM 6.6* 7.0*   PT/INR No results for input(s): LABPROT, INR in the last 72 hours.   Recent Labs Lab 04/28/15 1225 04/29/15 0350 04/30/15 0336 05/01/15 0340  AST 124* 94* 77* 44*  ALT 67* 66* 45 28  ALKPHOS 78 65 53 55  BILITOT 1.0 1.1 1.2 1.0  PROT 7.3 6.3* 5.6* 5.4*  ALBUMIN 4.2 3.2* 2.6* 2.3*     Lipase     Component Value Date/Time   LIPASE 364* 05/01/2015 0340     Studies/Results: Ct Abdomen Pelvis Wo Contrast  05/01/2015  CLINICAL DATA:  Acute pancreatitis.  Cholelithiasis. EXAM: CT ABDOMEN AND PELVIS WITHOUT CONTRAST TECHNIQUE: Multidetector CT imaging  of the abdomen and pelvis was performed following the standard protocol without IV contrast. COMPARISON:  CT 04/28/2015, ultrasound 04/28/2015 FINDINGS: Lower chest: Interval increase in bilateral pleural effusions with bibasilar with passive atelectasis and air bronchograms. No pericardial fluid. Hepatobiliary: The no focal hepatic lesions noncontrast exam. No biliary duct dilatation is evident on this noncontrast exam. Gallbladder is not distended. There several small gallstones noted measuring approximately 4 mm each. The common bile duct is difficult define does not appear distended. Pancreas: There is a decreased attenuation in the pancreas head and body as well as increase in pancreatic edema through the pancreatic head and body. The tail of the pancreas (image 29, series 2) is normal density without edema. The body of the pancreas measures 3.8 cm (image 30, series )2 increased in thickness from 3.2 cm comparison CT. There is fluid along the LEFT and RIGHT anterior para renal space decreased prior. No organized fluid collections present. Spleen: Normal spleen Adrenals/urinary tract: Adrenal glands are normal. Kidneys appear atrophic. Foley catheter within a collapsed bladder. Stomach/Bowel: The stomach is normal. NG tube in the distal esophagus. 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 No complication. There is no evidence of leakage of oral contrast through the duodenum. The proximal small bowel is dilated up to 5 cm. There is poor  egress of the oral contrast through the dilated small bowel which is fluid-filled. Favor these findings represent ileus associated with pancreatitis. Colon is collapsed. Small amount of contrast with the rectum related to prior CT scan on 04/28/2015. No intraperitoneal free air. Vascular/Lymphatic: Abdominal aorta is normal caliber with atherosclerotic calcification. There is no retroperitoneal or periportal lymphadenopathy. No pelvic  lymphadenopathy. Reproductive: Uterus and ovaries are normal. Other: No free fluid. Musculoskeletal: No aggressive osseous lesion. IMPRESSION: 1. Progressive pancreatitis with edema and loss of normal pancreatic density within the pancreatic body and head. Findings are concerning for necrosis of the pancreas. This cannot be confirmed without IV contrast. 2. Interval increase in the fluid along the LEFT and RIGHT anterior pararenal spaces without organized fluid collections. 3. Several small gallstones within the gallbladder without evidence of cholecystitis. 4. Dilatation of the proximal small bowel with poor progression of contrast is most consistent pancreatitis associated ileus. 5. Marked increase in bilateral pleural effusions and bibasilar atelectasis. These results will be called to the ordering clinician or representative by the Radiologist Assistant, and communication documented in the PACS or zVision Dashboard. Electronically Signed   By: Genevive Bi M.D.   On: 05/01/2015 19:12   Dg Chest Port 1 View  05/03/2015  CLINICAL DATA:  Follow-up respiratory failure.  Extubated. EXAM: PORTABLE CHEST 1 VIEW COMPARISON:  05/01/2014 FINDINGS: Endotracheal tube has been removed. Nasogastric tube is been removed. Left internal jugular central line has withdrawn somewhat, with the tip curled into the azygos vein. Patchy volume loss in the right lower lobe has improved but does persist to a degree. Partial collapse of left lower lobe is slightly improved. No worsening or new findings otherwise. IMPRESSION: Endotracheal tube and nasogastric tube removed. Central line pulled back somewhat with the tip in the azygos vein. Persistent volume loss in both lower lobes, with some improved aeration since yesterday. Electronically Signed   By: Paulina Fusi M.D.   On: 05/03/2015 07:30   Dg Chest Port 1 View  05/02/2015  CLINICAL DATA:  Respiratory failure. EXAM: PORTABLE CHEST 1 VIEW COMPARISON:  05/01/2015 . FINDINGS:  Endotracheal tube, NG tube, left IJ line in stable position. Persistent right base infiltrate and/or on. New left base infiltrate and/or atelectasis. Low lung volumes with atelectasis. No pleural effusion or pneumothorax. Heart size stable. IMPRESSION: 1. Lines and tubes in stable position. 2. Lung volumes with persistent right base infiltrate and/or atelectasis and new left base infiltrate and/or atelectasis Electronically Signed   By: Maisie Fus  Register   On: 05/02/2015 07:08   Dg Chest Port 1 View  05/01/2015  CLINICAL DATA:  Central line placement EXAM: PORTABLE CHEST 1 VIEW COMPARISON:  05/01/2015 FINDINGS: Endotracheal tube 1.3 cm from carina unchanged. Interval introduction of a LEFT central venous line with tip in the distal SVC versus proximal RIGHT atrium. No pneumothorax. Low lung volumes. IMPRESSION: 1. Introduction of a LEFT central venous line with tip in the distal SVC / cavoatrial junction. 2. Endotracheal tube unchanged in position. 3. Low lung volumes. Electronically Signed   By: Genevive Bi M.D.   On: 05/01/2015 17:50   Dg Chest Port 1 View  05/01/2015  CLINICAL DATA:  65 year old female with acute respiratory failure status post endotracheal tube placement. EXAM: PORTABLE CHEST 1 VIEW COMPARISON:  No priors. FINDINGS: An endotracheal tube is in place with tip 1.7 cm above the carina. Nasogastric tube extends into the distal third of the esophagus. Lung volumes are low. Bibasilar opacities may reflect areas of atelectasis and/or  consolidation. Small left pleural effusion. No evidence of pulmonary edema. Heart size is normal. The patient is rotated to the right on today's exam, resulting in distortion of the mediastinal contours and reduced diagnostic sensitivity and specificity for mediastinal pathology. No pneumothorax. IMPRESSION: 1. Support apparatus, as above. Endotracheal tube could be withdrawn approximately 3 cm for more optimal placement, and a nasogastric tube could be advanced at  least 10-15 cm for more optimal placement. 2. Low lung volumes with bibasilar areas of atelectasis and/or consolidation and small left pleural effusion. Electronically Signed   By: Trudie Reedaniel  Entrikin M.D.   On: 05/01/2015 12:18    Medications: . antiseptic oral rinse  7 mL Mouth Rinse BID  . famotidine (PEPCID) IV  20 mg Intravenous Q24H  . heparin  5,000 Units Subcutaneous 3 times per day  . insulin aspart  0-20 Units Subcutaneous 6 times per day  . insulin glargine  10 Units Subcutaneous QHS  . levalbuterol  0.63 mg Nebulization Q4H  . meropenem (MERREM) IV  500 mg Intravenous Q12H  . potassium chloride  10 mEq Intravenous Q1 Hr x 6  . sodium chloride  3 mL Intravenous Q12H    Assessment/Plan Severe pancreatitis with Systemic inflammatory response syndrome Cholelithiasis  Acute respiratory failure with intubation 04/30/14 AODM type II Chronic renal disease creatinine is 2.39 Hyperlipidemia Olivopontocerebellar atrophy  Antibiotics: Day 5; day 1&2 imipenem-cilastatin , 2 days of ceftriaxone and flagyl, started on Meropenem 04/30/14 DVT: Heparin/SCD   Plan:  Ongoing medical management.  I would consider TNA for nutrition, but defer to CCM.      LOS: 5 days    JENNINGS,WILLARD 05/03/2015

## 2015-05-04 DIAGNOSIS — J9620 Acute and chronic respiratory failure, unspecified whether with hypoxia or hypercapnia: Secondary | ICD-10-CM

## 2015-05-04 DIAGNOSIS — K429 Umbilical hernia without obstruction or gangrene: Secondary | ICD-10-CM

## 2015-05-04 DIAGNOSIS — E440 Moderate protein-calorie malnutrition: Secondary | ICD-10-CM

## 2015-05-04 DIAGNOSIS — K8591 Acute pancreatitis with uninfected necrosis, unspecified: Secondary | ICD-10-CM

## 2015-05-04 LAB — CBC WITH DIFFERENTIAL/PLATELET
BASOS ABS: 0 10*3/uL (ref 0.0–0.1)
BASOS PCT: 0 %
EOS ABS: 0.1 10*3/uL (ref 0.0–0.7)
EOS PCT: 0 %
HCT: 30.8 % — ABNORMAL LOW (ref 36.0–46.0)
HEMOGLOBIN: 10.3 g/dL — AB (ref 12.0–15.0)
LYMPHS ABS: 1.7 10*3/uL (ref 0.7–4.0)
Lymphocytes Relative: 12 %
MCH: 28.3 pg (ref 26.0–34.0)
MCHC: 33.4 g/dL (ref 30.0–36.0)
MCV: 84.6 fL (ref 78.0–100.0)
Monocytes Absolute: 1.1 10*3/uL — ABNORMAL HIGH (ref 0.1–1.0)
Monocytes Relative: 8 %
NEUTROS ABS: 11.4 10*3/uL — AB (ref 1.7–7.7)
Neutrophils Relative %: 80 %
PLATELETS: 292 10*3/uL (ref 150–400)
RBC: 3.64 MIL/uL — AB (ref 3.87–5.11)
RDW: 13.1 % (ref 11.5–15.5)
WBC: 14.3 10*3/uL — ABNORMAL HIGH (ref 4.0–10.5)

## 2015-05-04 LAB — MAGNESIUM: MAGNESIUM: 1.9 mg/dL (ref 1.7–2.4)

## 2015-05-04 LAB — BASIC METABOLIC PANEL
ANION GAP: 12 (ref 5–15)
BUN: 37 mg/dL — AB (ref 6–20)
CALCIUM: 7.6 mg/dL — AB (ref 8.9–10.3)
CO2: 20 mmol/L — AB (ref 22–32)
Chloride: 113 mmol/L — ABNORMAL HIGH (ref 101–111)
Creatinine, Ser: 2.28 mg/dL — ABNORMAL HIGH (ref 0.44–1.00)
GFR calc Af Amer: 25 mL/min — ABNORMAL LOW (ref >60)
GFR calc non Af Amer: 21 mL/min — ABNORMAL LOW (ref >60)
GLUCOSE: 273 mg/dL — AB (ref 65–99)
POTASSIUM: 3.2 mmol/L — AB (ref 3.5–5.1)
Sodium: 145 mmol/L (ref 135–145)

## 2015-05-04 LAB — GLUCOSE, CAPILLARY
GLUCOSE-CAPILLARY: 210 mg/dL — AB (ref 65–99)
Glucose-Capillary: 222 mg/dL — ABNORMAL HIGH (ref 65–99)
Glucose-Capillary: 228 mg/dL — ABNORMAL HIGH (ref 65–99)
Glucose-Capillary: 239 mg/dL — ABNORMAL HIGH (ref 65–99)
Glucose-Capillary: 170 mg/dL — ABNORMAL HIGH (ref 65–99)

## 2015-05-04 LAB — LIPASE, BLOOD: LIPASE: 53 U/L — AB (ref 11–51)

## 2015-05-04 MED ORDER — CHLORHEXIDINE GLUCONATE 0.12 % MT SOLN
15.0000 mL | Freq: Two times a day (BID) | OROMUCOSAL | Status: DC
  Administered 2015-05-05 – 2015-05-24 (×39): 15 mL via OROMUCOSAL
  Filled 2015-05-04 (×33): qty 15

## 2015-05-04 MED ORDER — POTASSIUM CHLORIDE 10 MEQ/50ML IV SOLN
10.0000 meq | INTRAVENOUS | Status: DC
  Filled 2015-05-04: qty 50

## 2015-05-04 MED ORDER — ASPIRIN 81 MG PO CHEW
81.0000 mg | CHEWABLE_TABLET | Freq: Every day | ORAL | Status: DC
  Administered 2015-05-04 – 2015-05-24 (×15): 81 mg via ORAL
  Filled 2015-05-04 (×16): qty 1

## 2015-05-04 MED ORDER — SODIUM CHLORIDE 0.9 % IV SOLN
8.0000 mg | Freq: Four times a day (QID) | INTRAVENOUS | Status: DC | PRN
  Filled 2015-05-04: qty 4

## 2015-05-04 MED ORDER — FENTANYL CITRATE (PF) 100 MCG/2ML IJ SOLN
25.0000 ug | INTRAMUSCULAR | Status: DC | PRN
  Administered 2015-05-04 (×3): 50 ug via INTRAVENOUS
  Administered 2015-05-06: 25 ug via INTRAVENOUS
  Administered 2015-05-06 – 2015-05-08 (×6): 50 ug via INTRAVENOUS
  Administered 2015-05-14: 25 ug via INTRAVENOUS
  Administered 2015-05-14 – 2015-05-16 (×3): 50 ug via INTRAVENOUS
  Filled 2015-05-04 (×14): qty 2

## 2015-05-04 MED ORDER — BISMUTH SUBSALICYLATE 262 MG/15ML PO SUSP: 30.0000 mL | Freq: Three times a day (TID) | ORAL | Status: DC | PRN

## 2015-05-04 MED ORDER — LORAZEPAM 2 MG/ML IJ SOLN
2.0000 mg | Freq: Once | INTRAMUSCULAR | Status: AC
  Administered 2015-05-04: 2 mg via INTRAVENOUS
  Filled 2015-05-04: qty 1

## 2015-05-04 MED ORDER — SACCHAROMYCES BOULARDII 250 MG PO CAPS
250.0000 mg | ORAL_CAPSULE | Freq: Two times a day (BID) | ORAL | Status: DC
  Administered 2015-05-04 (×2): 250 mg via ORAL
  Filled 2015-05-04 (×3): qty 1

## 2015-05-04 MED ORDER — DIPHENHYDRAMINE HCL 50 MG/ML IJ SOLN
12.5000 mg | Freq: Four times a day (QID) | INTRAMUSCULAR | Status: DC | PRN
  Administered 2015-05-04: 12.5 mg via INTRAVENOUS
  Filled 2015-05-04: qty 1

## 2015-05-04 MED ORDER — ONDANSETRON HCL 4 MG/2ML IJ SOLN: 4.0000 mg | Freq: Four times a day (QID) | INTRAMUSCULAR | Status: DC | PRN

## 2015-05-04 MED ORDER — SODIUM CHLORIDE 0.9 % IV BOLUS (SEPSIS)
750.0000 mL | Freq: Once | INTRAVENOUS | Status: AC
  Administered 2015-05-04: 750 mL via INTRAVENOUS

## 2015-05-04 MED ORDER — LEVALBUTEROL HCL 0.63 MG/3ML IN NEBU
0.6300 mg | INHALATION_SOLUTION | Freq: Three times a day (TID) | RESPIRATORY_TRACT | Status: DC
  Administered 2015-05-05 – 2015-05-07 (×6): 0.63 mg via RESPIRATORY_TRACT
  Filled 2015-05-04 (×7): qty 3

## 2015-05-04 MED ORDER — PROMETHAZINE HCL 25 MG/ML IJ SOLN: 6.2500 mg | INTRAMUSCULAR | Status: DC | PRN

## 2015-05-04 MED ORDER — INSULIN GLARGINE 100 UNIT/ML ~~LOC~~ SOLN
10.0000 [IU] | Freq: Two times a day (BID) | SUBCUTANEOUS | Status: DC
  Administered 2015-05-04: 10 [IU] via SUBCUTANEOUS
  Filled 2015-05-04 (×2): qty 0.1

## 2015-05-04 MED ORDER — LACTATED RINGERS IV BOLUS (SEPSIS): 1000.0000 mL | Freq: Three times a day (TID) | INTRAVENOUS | Status: AC | PRN

## 2015-05-04 MED ORDER — POTASSIUM CHLORIDE 10 MEQ/100ML IV SOLN
10.0000 meq | INTRAVENOUS | Status: AC
  Administered 2015-05-04 (×2): 10 meq via INTRAVENOUS
  Filled 2015-05-04 (×2): qty 100

## 2015-05-04 MED ORDER — CETYLPYRIDINIUM CHLORIDE 0.05 % MT LIQD
7.0000 mL | Freq: Two times a day (BID) | OROMUCOSAL | Status: DC
  Administered 2015-05-05 – 2015-05-24 (×38): 7 mL via OROMUCOSAL

## 2015-05-04 NOTE — Progress Notes (Signed)
Assessed BUE for PICC placement.  No suitable veins noted.  All veins measure with greater than 55 % catheter occupancy.  Able to obtain PIV after 3 attempts.  Onalee Huaavid RN notified, Dr. Vassie LollAlva paged. Please refer to IR if PICC still desired.

## 2015-05-04 NOTE — Progress Notes (Addendum)
[address] [place], [place] [address] Phone: [phone] FAX: [place] [phone] 01/04/1951   Assessment  GUARDED With severe pancreatitis  Problem List:   Principal Problem:   Acute necrotizing pancreatitis with SIRS Active Problems:   Gallstones   Transaminitis   DM type 2 (diabetes mellitus, type 2) ([place])   Hyperlipidemia   CKD (chronic kidney disease)   ARF (acute renal failure) (H[facility])   Acute respiratory failure with hypoxia (H[facility])   Aspiration pneumonia due to vomit ([place])   Acute on chronic respiratory failure (HCC)   Protein-calorie malnutrition, moderate (H[facility])   Umbilical hernia      * No surgery found *      Plan:  -sips - seriously doubt PO going to work with massive distention  -start TNA for expected poor PO intake given massive distention & necrotic pancreatitis  -at some point would benefit from cholecystectomy (although gallstone pancreatitis is rarely this severe & need medice/PCCM due diligence to r/o other etiologies).  Would wait on chole until SIRS resolved & much more stable from pancreatitis  -DM control - challenge with pancreatitis & TNA   -ARF nonoliguric - follow  -ileus resolving - follow.  Try gentle bowel regimen  -VTE prophylaxis- SCDs, etc -mobilize as tolerated to help recovery  Adin Hector, M.D., F.A.C.S. Gastrointestinal and Minimally Invasive Surgery [facility] [address] [place], [place] [address] [phone] Main / Paging   05/04/2015  Subjective:  Bloated Tolerating a few sips - prefers ice chips Up in chair - wants to get back to bed  Objective:  Vital signs:  Filed Vitals:   05/04/15 0600 05/04/15 0700 05/04/15 0730 05/04/15 0820  BP: 104/86 86/52 132/80 119/78  Pulse:  112 115 116  Temp:      TempSrc:      Resp: 20 35 35 36  Height:      Weight:      SpO2:  100% 100% 100%     Last BM Date: 05/03/15  Intake/Output   Yesterday:  01/06 0701 - 01/07 0700 In: 2178.8 [I.V.:1878.8; IV Piggyback:300] Out: 0211 [ZNBVA:7014; Stool:3650] This shift:     Bowel function:  Flatus: y  BM: loose  Drain: n/a  Physical Exam:  General: Pt awake/alert/oriented x4 in mild acute distress Eyes: PERRL, normal EOM.  Sclera clear.  No icterus Neuro: CN II-XII intact w/o focal sensory/motor deficits. Lymph: No head/neck/groin lymphadenopathy Psych:  No delerium/psychosis/paranoia HENT: Normocephalic, Mucus membranes moist.  No thrush Neck: Supple, No tracheal deviation Chest: No chest wall pain w good excursion CV:  Pulses intact.  Regular rhythm MS: Normal AROM mjr joints.  No obvious deformity Abdomen: Firm.  Massively distended.  Uncomfortable but no pain.  No evidence of peritonitis.  Soft small umb hernia.  No incarcerated hernias. Ext:  SCDs BLE.  No mjr edema.  No cyanosis Skin: No petechiae / purpura  Results:   Labs: Results for orders placed or performed during the hospital encounter of 04/28/15 (from the past 48 hour(s))  Glucose, capillary     Status: Abnormal   Collection Time: 05/02/15 11:17 AM  Result Value Ref Range   Glucose-Capillary 129 (H) 65 - 99 mg/dL  Glucose, capillary     Status: Abnormal   Collection Time: 05/02/15  3:40 PM  Result Value Ref Range   Glucose-Capillary 117 (H) 65 - 99 mg/dL  Basic metabolic panel  Status: Abnormal   Collection Time: 05/02/15  5:10 PM  Result Value Ref Range   Sodium 150 (H) 135 - 145 mmol/L   Potassium 3.3 (L) 3.5 - 5.1 mmol/L   Chloride 116 (H) 101 - 111 mmol/L   CO2 22 22 - 32 mmol/L   Glucose, Bld 163 (H) 65 - 99 mg/dL   BUN 36 (H) 6 - 20 mg/dL   Creatinine, Ser 2.33 (H) 0.44 - 1.00 mg/dL   Calcium 6.6 (L) 8.9 - 10.3 mg/dL   GFR calc non Af Amer 21 (L) >60 mL/min   GFR calc Af Amer 24 (L) >60 mL/min    Comment: (NOTE) The eGFR has been calculated using the CKD EPI equation. This  calculation has not been validated in all clinical situations. eGFR's persistently <60 mL/min signify possible Chronic Kidney Disease.    Anion gap 12 5 - 15  Glucose, capillary     Status: Abnormal   Collection Time: 05/02/15  8:42 PM  Result Value Ref Range   Glucose-Capillary 110 (H) 65 - 99 mg/dL   Comment 1 Notify RN    Comment 2 Document in Chart   Glucose, capillary     Status: Abnormal   Collection Time: 05/02/15 11:18 PM  Result Value Ref Range   Glucose-Capillary 136 (H) 65 - 99 mg/dL   Comment 1 Notify RN    Comment 2 Document in Chart   Glucose, capillary     Status: Abnormal   Collection Time: 05/03/15  4:05 AM  Result Value Ref Range   Glucose-Capillary 149 (H) 65 - 99 mg/dL  Basic metabolic panel     Status: Abnormal   Collection Time: 05/03/15  4:30 AM  Result Value Ref Range   Sodium 150 (H) 135 - 145 mmol/L   Potassium 2.8 (L) 3.5 - 5.1 mmol/L   Chloride 113 (H) 101 - 111 mmol/L   CO2 23 22 - 32 mmol/L   Glucose, Bld 192 (H) 65 - 99 mg/dL   BUN 38 (H) 6 - 20 mg/dL   Creatinine, Ser 2.33 (H) 0.44 - 1.00 mg/dL   Calcium 7.0 (L) 8.9 - 10.3 mg/dL   GFR calc non Af Amer 21 (L) >60 mL/min   GFR calc Af Amer 24 (L) >60 mL/min    Comment: (NOTE) The eGFR has been calculated using the CKD EPI equation. This calculation has not been validated in all clinical situations. eGFR's persistently <60 mL/min signify possible Chronic Kidney Disease.    Anion gap 14 5 - 15  CBC with Differential/Platelet     Status: Abnormal   Collection Time: 05/03/15  4:30 AM  Result Value Ref Range   WBC 18.4 (H) 4.0 - 10.5 K/uL   RBC 3.44 (L) 3.87 - 5.11 MIL/uL   Hemoglobin 9.8 (L) 12.0 - 15.0 g/dL   HCT 29.0 (L) 36.0 - 46.0 %   MCV 84.3 78.0 - 100.0 fL   MCH 28.5 26.0 - 34.0 pg   MCHC 33.8 30.0 - 36.0 g/dL   RDW 13.2 11.5 - 15.5 %   Platelets 333 150 - 400 K/uL   Neutrophils Relative % 77 %   Neutro Abs 14.0 (H) 1.7 - 7.7 K/uL   Lymphocytes Relative 14 %   Lymphs Abs 2.6 0.7  - 4.0 K/uL   Monocytes Relative 9 %   Monocytes Absolute 1.7 (H) 0.1 - 1.0 K/uL   Eosinophils Relative 0 %   Eosinophils Absolute 0.0 0.0 - 0.7 K/uL  Basophils Relative 0 %   Basophils Absolute 0.0 0.0 - 0.1 K/uL  Glucose, capillary     Status: Abnormal   Collection Time: 05/03/15  7:38 AM  Result Value Ref Range   Glucose-Capillary 158 (H) 65 - 99 mg/dL   Comment 1 Notify RN    Comment 2 Document in Chart   C difficile quick scan w PCR reflex     Status: None   Collection Time: 05/03/15 10:30 AM  Result Value Ref Range   C Diff antigen NEGATIVE NEGATIVE   C Diff toxin NEGATIVE NEGATIVE   C Diff interpretation Negative for toxigenic C. difficile     Comment: CORRECTED ON 01/06 AT 1423: PREVIOUSLY REPORTED AS NEGATIVE  Glucose, capillary     Status: Abnormal   Collection Time: 05/03/15 12:08 PM  Result Value Ref Range   Glucose-Capillary 140 (H) 65 - 99 mg/dL   Comment 1 Notify RN    Comment 2 Document in Chart   Glucose, capillary     Status: Abnormal   Collection Time: 05/03/15  3:32 PM  Result Value Ref Range   Glucose-Capillary 214 (H) 65 - 99 mg/dL   Comment 1 Notify RN    Comment 2 Document in Chart   Basic metabolic panel     Status: Abnormal   Collection Time: 05/03/15  4:00 PM  Result Value Ref Range   Sodium 144 135 - 145 mmol/L   Potassium 3.1 (L) 3.5 - 5.1 mmol/L   Chloride 111 101 - 111 mmol/L   CO2 22 22 - 32 mmol/L   Glucose, Bld 272 (H) 65 - 99 mg/dL   BUN 37 (H) 6 - 20 mg/dL   Creatinine, Ser 2.22 (H) 0.44 - 1.00 mg/dL   Calcium 6.9 (L) 8.9 - 10.3 mg/dL   GFR calc non Af Amer 22 (L) >60 mL/min   GFR calc Af Amer 25 (L) >60 mL/min    Comment: (NOTE) The eGFR has been calculated using the CKD EPI equation. This calculation has not been validated in all clinical situations. eGFR's persistently <60 mL/min signify possible Chronic Kidney Disease.    Anion gap 11 5 - 15  Glucose, capillary     Status: Abnormal   Collection Time: 05/03/15  7:57 PM   Result Value Ref Range   Glucose-Capillary 207 (H) 65 - 99 mg/dL  Glucose, capillary     Status: Abnormal   Collection Time: 05/03/15 11:25 PM  Result Value Ref Range   Glucose-Capillary 195 (H) 65 - 99 mg/dL  Basic metabolic panel     Status: Abnormal   Collection Time: 05/04/15  3:37 AM  Result Value Ref Range   Sodium 145 135 - 145 mmol/L   Potassium 3.2 (L) 3.5 - 5.1 mmol/L   Chloride 113 (H) 101 - 111 mmol/L   CO2 20 (L) 22 - 32 mmol/L   Glucose, Bld 273 (H) 65 - 99 mg/dL   BUN 37 (H) 6 - 20 mg/dL   Creatinine, Ser 2.28 (H) 0.44 - 1.00 mg/dL   Calcium 7.6 (L) 8.9 - 10.3 mg/dL   GFR calc non Af Amer 21 (L) >60 mL/min   GFR calc Af Amer 25 (L) >60 mL/min    Comment: (NOTE) The eGFR has been calculated using the CKD EPI equation. This calculation has not been validated in all clinical situations. eGFR's persistently <60 mL/min signify possible Chronic Kidney Disease.    Anion gap 12 5 - 15  CBC with Differential/Platelet  Status: Abnormal   Collection Time: 05/04/15  3:37 AM  Result Value Ref Range   WBC 14.3 (H) 4.0 - 10.5 K/uL   RBC 3.64 (L) 3.87 - 5.11 MIL/uL   Hemoglobin 10.3 (L) 12.0 - 15.0 g/dL   HCT 30.8 (L) 36.0 - 46.0 %   MCV 84.6 78.0 - 100.0 fL   MCH 28.3 26.0 - 34.0 pg   MCHC 33.4 30.0 - 36.0 g/dL   RDW 13.1 11.5 - 15.5 %   Platelets 292 150 - 400 K/uL   Neutrophils Relative % 80 %   Neutro Abs 11.4 (H) 1.7 - 7.7 K/uL   Lymphocytes Relative 12 %   Lymphs Abs 1.7 0.7 - 4.0 K/uL   Monocytes Relative 8 %   Monocytes Absolute 1.1 (H) 0.1 - 1.0 K/uL   Eosinophils Relative 0 %   Eosinophils Absolute 0.1 0.0 - 0.7 K/uL   Basophils Relative 0 %   Basophils Absolute 0.0 0.0 - 0.1 K/uL  Glucose, capillary     Status: Abnormal   Collection Time: 05/04/15  3:46 AM  Result Value Ref Range   Glucose-Capillary 222 (H) 65 - 99 mg/dL  Glucose, capillary     Status: Abnormal   Collection Time: 05/04/15  7:39 AM  Result Value Ref Range   Glucose-Capillary 210  (H) 65 - 99 mg/dL   Comment 1 Notify RN    Comment 2 Document in Chart     Imaging / Studies: Dg Chest Port 1 View  05/03/2015  CLINICAL DATA:  Follow-up respiratory failure.  Extubated. EXAM: PORTABLE CHEST 1 VIEW COMPARISON:  05/01/2014 FINDINGS: Endotracheal tube has been removed. Nasogastric tube is been removed. Left internal jugular central line has withdrawn somewhat, with the tip curled into the azygos vein. Patchy volume loss in the right lower lobe has improved but does persist to a degree. Partial collapse of left lower lobe is slightly improved. No worsening or new findings otherwise. IMPRESSION: Endotracheal tube and nasogastric tube removed. Central line pulled back somewhat with the tip in the azygos vein. Persistent volume loss in both lower lobes, with some improved aeration since yesterday. Electronically Signed   By: Nelson Chimes M.D.   On: 05/03/2015 07:30    Medications / Allergies: per chart  Antibiotics: Anti-infectives    Start     Dose/Rate Route Frequency Ordered Stop   05/03/15 1200  vancomycin (VANCOCIN) 50 mg/mL oral solution 500 mg     500 mg Oral 4 times per day 05/03/15 0917     05/02/15 1200  cefTRIAXone (ROCEPHIN) 1 g in dextrose 5 % 50 mL IVPB  Status:  Discontinued     1 g 100 mL/hr over 30 Minutes Intravenous Every 24 hours 05/01/15 1448 05/01/15 2004   05/02/15 0600  imipenem-cilastatin (PRIMAXIN) 250 mg in sodium chloride 0.9 % 100 mL IVPB  Status:  Discontinued     250 mg 200 mL/hr over 30 Minutes Intravenous 3 times per day 05/01/15 2025 05/02/15 0027   05/02/15 0030  meropenem (MERREM) 500 mg in sodium chloride 0.9 % 50 mL IVPB     500 mg 100 mL/hr over 30 Minutes Intravenous Every 12 hours 05/02/15 0027     05/01/15 2030  imipenem-cilastatin (PRIMAXIN) 250 mg in sodium chloride 0.9 % 100 mL IVPB     250 mg 200 mL/hr over 30 Minutes Intravenous NOW 05/01/15 2025 05/01/15 2110   04/30/15 1100  metroNIDAZOLE (FLAGYL) IVPB 500 mg  Status:   Discontinued  500 mg 100 mL/hr over 60 Minutes Intravenous Every 12 hours 04/30/15 0951 05/02/15 0751   04/30/15 1000  cefTRIAXone (ROCEPHIN) 1 g in dextrose 5 % 50 mL IVPB  Status:  Discontinued     1 g 100 mL/hr over 30 Minutes Intravenous Every 24 hours 04/30/15 0939 05/01/15 1205   04/30/15 0945  cefTRIAXone (ROCEPHIN) injection 1 g  Status:  Discontinued     1 g Intramuscular Every 24 hours 04/30/15 0930 04/30/15 0938   04/29/15 0200  imipenem-cilastatin (PRIMAXIN) 250 mg in sodium chloride 0.9 % 100 mL IVPB  Status:  Discontinued     250 mg 200 mL/hr over 30 Minutes Intravenous Every 8 hours 04/28/15 1812 04/30/15 0930   04/28/15 1830  imipenem-cilastatin (PRIMAXIN) 250 mg in sodium chloride 0.9 % 100 mL IVPB     250 mg 200 mL/hr over 30 Minutes Intravenous  Once 04/28/15 1812 04/28/15 1904        Note: Portions of this report may have been transcribed using voice recognition software. Every effort was made to ensure accuracy; however, inadvertent computerized transcription errors may be present.   Any transcriptional errors that result from this process are unintentional.     Adin Hector, M.D., F.A.C.S. Gastrointestinal and Minimally Invasive Surgery [facility] [address] [place], [place] [address] [phone] Main / Paging   05/04/2015  CARE TEAM:  PCP: Rachell Cipro, MD  Outpatient Care Team: Patient Care Team: Fanny Bien, MD as PCP - General (Family Medicine)  Inpatient Treatment Team: Treatment Team: Attending Provider: Juanito Doom, MD; Rounding Team: Nolon Nations, MD; Rounding Team: Md Pccm, MD; Registered Nurse: Irven Baltimore, RN

## 2015-05-04 NOTE — Progress Notes (Signed)
Pharmacy - TPN  Assessment:  [age] yoF ordered TPN d/t expected prolonged poor intake with pancreatitis and abd distention  No central line present; PICC ordered but IV team unable to place today  Plan:  F/u placement of PICC and begin TPN tomorrow as able  Bernadene Personrew Levelle Edelen, PharmD, BCPS Pager: [phone] 05/04/2015, 5:56 PM

## 2015-05-04 NOTE — Progress Notes (Signed)
PULMONARY / CRITICAL CARE MEDICINE   Name: Victoria Morrow MRN: [id number] DOB: 1950/08/08    ADMISSION DATE:  04/28/2015 CONSULTATION DATE: 1/2  REFERRING MD:  Rito Ehrlich  CHIEF COMPLAINT:  Hypotension and pancreatitis   BRIEF DESCRIPTION:   65 year old female admitted on 1/1 with severe acute pancreatitis believed to be related to gallstones but no CBD or pancreatic duct dilation on admission.  Takes ACE inhibitor baseline.  Had mild gall bladder wall thickening on ultrasound with 5mm stone in neck of gallbladder.  Aspirated on 1/4, requiring intubation, extubated 1/5. Fever 1/5.   SUBJECTIVE:  C/o abd pain Loose stools afebrile Taking some liquids - intake poor  VITAL SIGNS: BP 108/68 mmHg  Pulse 121  Temp(Src) 98.1 F (36.7 C) (Oral)  Resp 34  Ht 5\' 2"  (1.575 m)  Wt 70.5 kg (155 lb 6.8 oz)  BMI 28.42 kg/m2  SpO2 100%  HEMODYNAMICS:    VENTILATOR SETTINGS:    INTAKE / OUTPUT: I/O last 3 completed shifts: In: 4155.8 [I.V.:3695.8; Other:110; IV Piggyback:350] Out: 5500 [Urine:1850; Stool:3650]  PHYSICAL EXAMINATION: General:  Awake, alert, conversant HENT: NCAT, no JVD PULM: CTA B, normal effort CV: RRR, no mgr GI: Belly soft, mild diffuse tenderness MSK: normal bulk and tone Neuro: awake, alert, no distress  LABS:  BMET  Recent Labs Lab 05/03/15 0430 05/03/15 1600 05/04/15 0337  NA 150* 144 145  K 2.8* 3.1* 3.2*  CL 113* 111 113*  CO2 23 22 20*  BUN 38* 37* 37*  CREATININE 2.33* 2.22* 2.28*  GLUCOSE 192* 272* 273*    Electrolytes  Recent Labs Lab 05/03/15 0430 05/03/15 1600 05/04/15 0337  CALCIUM 7.0* 6.9* 7.6*  MG  --   --  1.9    CBC  Recent Labs Lab 05/02/15 0925 05/03/15 0430 05/04/15 0337  WBC 13.8* 18.4* 14.3*  HGB 9.9* 9.8* 10.3*  HCT 29.6* 29.0* 30.8*  PLT 282 333 292    Coag's  Recent Labs Lab 04/28/15 1820  APTT 27  INR 1.04    Sepsis Markers  Recent Labs Lab 04/28/15 1705 04/28/15 1820 04/28/15 2123   LATICACIDVEN 1.74 2.0 1.9  PROCALCITON  --  11.43  --     ABG  Recent Labs Lab 05/01/15 1547 05/01/15 2100 05/02/15 0435  PHART 7.431 7.429 7.448  PCO2ART 20.9* 27.6* 27.8*  PO2ART 203* 123* 79.9*    Liver Enzymes  Recent Labs Lab 04/29/15 0350 04/30/15 0336 05/01/15 0340  AST 94* 77* 44*  ALT 66* 45 28  ALKPHOS 65 53 55  BILITOT 1.1 1.2 1.0  ALBUMIN 3.2* 2.6* 2.3*    Cardiac Enzymes No results for input(s): TROPONINI, PROBNP in the last 168 hours.  Glucose  Recent Labs Lab 05/03/15 1208 05/03/15 1532 05/03/15 1957 05/03/15 2325 05/04/15 0346 05/04/15 0739  GLUCAP 140* 214* 207* 195* 222* 210*    Imaging  STUDIES:  CT abd 1/1 1. Upper abdominal edema, eccentric right. Centered about the pancreatic head, uncinate process, and neck. Favor pancreatitis. Correlate with pancreatic enzymes (pending). If these are abnormal, less likely differential consideration would include peptic ulcer Disease. 2. Cholelithiasis without specific evidence of cholecystitis or biliary duct dilatation. 3. Trace perihepatic ascites, likely secondary. 4. Small hiatal hernia. 5. Pelvic floor laxity. 6. Renal atrophy. [place] abd 1/1: 1. Gallbladder wall thickening, with mild pericholecystic fluid.This is nonspecific in the presence of the patient's known pancreatitis. Stones noted within the gallbladder, with a 5 mm stone lodged at the neck of the gallbladder. Mild acute  cholecystitis cannot be entirely excluded, but is considered less likely. 2. No evidence for bile duct obstruction. The patient's apparent duodenal diverticulum is not well characterized on ultrasound. CT abdomen/pelvis 1/5 > necrotic pancreas most likely, limited by lack of contrast, no cholecystitis, small stones, prox small bowel dilation, likely ileus, increased bilateral atelectasis/effusions, some peritoneal fluid  1/6 CXR images personally reviewed> low lung volumes, L IJ in place, bilateral atelectasis, no significant  airspace disease  CULTURES: BCX2 1/1>>> neg UC 1/1>>> Klebsiella, amp resistant BC x2 1/5 > ng Resp cx 1/5 >  C diff 1/6 > neg  ANTIBIOTICS: Imipenem 1/1>>> 1/3 Ceftriaxone 1/3 > 1/4 Flagyl 1/3 > 1/5 Meropenem 1/4 >  Oral Vanc 1/6>   SIGNIFICANT EVENTS: 1/4 vomited, aspirated, intubated, shock 1/5 extubated  LINES/TUBES: 1/4 ETT > 1/5 1/4 L IJ CVL >   DISCUSSION: 65 year old female admitted severe acute pancreatitis, presumably gallstone related but no obstructing stone seen on imaging aside from one in neck of gallbladder.  Aspirated on 1/4, requiring intubation and central line placement.  CT abdomen on 1/4 shows likely pancreatic necrosis. Extubated 1/5, diarrhea overnight.  ASSESSMENT / PLAN:  PULMONARY A: Acute respiratory failure with hypoxemia > resolved P:   Incentive spirometry Out of bed Flutter prn Prn xopenex  CARDIOVASCULAR A:  Shock> Still on neo but blood MAP > 70 Transient Afib 1/6 AM P:   MAP goal > 55 Continue IVF Off Neo   RENAL A:   AKI, UOP OK > improving Hyperchloremia  Hypernatremia  Hypocalcemia Hypokalemia P:   Ct 1/2 NS with 20mEq KCL Replete lytes I&O strict   GASTROINTESTINAL A:   Acute pancreatitis--> likely necrosis Gall bladder wall thickening, GB stone but neg Murphy's sign 1/3 Ileus> improved Diarrhea  P:   Slowly advance to clears today, aspiration precautions Start TNA Prn zofran Continue Pain control Cholecystectomy eventually per gen surg     HEMATOLOGIC A:   No acute issues P:  Trend CBC Transfuse per ICU protocol  [place] heparin   INFECTIOUS A:   SIRS due to pancreatitis UTI> Klebiella WBC up 1/6, diarrhea, multiple antibiotics, at risk for C diff P:   Continue meropenem today, but if cultures negative 1/7 would narrow to cover klebsiella Dc oral vanc   ENDOCRINE A:   Hyperglycemia P:   ICU glycemic control protocol   NEUROLOGIC A:   Pain control P:   Fentanyl prn   FAMILY  -  Updates: husband updated 1/6 AM bedside  - Inter-disciplinary family meet or Palliative Care meeting due by:  NA  Summary - Etiology of acute pancreatitis not clear, recovering organ failure   The patient is critically ill with multiple organ systems failure and requires high complexity decision making for assessment and support, frequent evaluation and titration of therapies, application of advanced monitoring technologies and extensive interpretation of multiple databases. Critical Care Time devoted to patient care services described in this note independent of APP time is 32 minutes.   Cyril Mourningakesh Annjeanette Sarwar MD. Tonny BollmanFCCP. [facility] Pager [phone] If no response call [phone]    05/04/2015, 12:59 PM

## 2015-05-04 NOTE — Progress Notes (Signed)
eLink Physician-Brief Progress Note Patient Name: Victoria Morrow DOB: 06-Jun-1950 MRN: [id number]   Date of Service  05/04/2015  HPI/Events of Note  Pt confused.  Trying to pull of her monitor leads.  Has prolonged Qt.  eICU Interventions  Will give ativan 2 mg IV x one.     Intervention Category Major Interventions: Other:  Markanthony Gedney 05/04/2015, 11:42 PM

## 2015-05-05 ENCOUNTER — Inpatient Hospital Stay (HOSPITAL_COMMUNITY): Payer: Commercial Managed Care - HMO

## 2015-05-05 DIAGNOSIS — G238 Other specified degenerative diseases of basal ganglia: Secondary | ICD-10-CM | POA: Diagnosis present

## 2015-05-05 LAB — CULTURE, RESPIRATORY
CULTURE: NO GROWTH
SPECIAL REQUESTS: NORMAL

## 2015-05-05 LAB — BASIC METABOLIC PANEL
Anion gap: 14 (ref 5–15)
BUN: 31 mg/dL — ABNORMAL HIGH (ref 6–20)
CHLORIDE: 115 mmol/L — AB (ref 101–111)
CO2: 19 mmol/L — ABNORMAL LOW (ref 22–32)
Calcium: 8.3 mg/dL — ABNORMAL LOW (ref 8.9–10.3)
Creatinine, Ser: 1.85 mg/dL — ABNORMAL HIGH (ref 0.44–1.00)
GFR calc non Af Amer: 27 mL/min — ABNORMAL LOW (ref >60)
GFR, EST AFRICAN AMERICAN: 32 mL/min — AB (ref >60)
Glucose, Bld: 180 mg/dL — ABNORMAL HIGH (ref 65–99)
POTASSIUM: 3.5 mmol/L (ref 3.5–5.1)
Sodium: 148 mmol/L — ABNORMAL HIGH (ref 135–145)

## 2015-05-05 LAB — CBC
HEMATOCRIT: 29.8 % — AB (ref 36.0–46.0)
Hemoglobin: 9.9 g/dL — ABNORMAL LOW (ref 12.0–15.0)
MCH: 28.4 pg (ref 26.0–34.0)
MCHC: 33.2 g/dL (ref 30.0–36.0)
MCV: 85.6 fL (ref 78.0–100.0)
Platelets: 410 10*3/uL — ABNORMAL HIGH (ref 150–400)
RBC: 3.48 MIL/uL — AB (ref 3.87–5.11)
RDW: 13.1 % (ref 11.5–15.5)
WBC: 22 10*3/uL — AB (ref 4.0–10.5)

## 2015-05-05 LAB — GLUCOSE, CAPILLARY
GLUCOSE-CAPILLARY: 174 mg/dL — AB (ref 65–99)
GLUCOSE-CAPILLARY: 248 mg/dL — AB (ref 65–99)
GLUCOSE-CAPILLARY: 176 mg/dL — AB (ref 65–99)
GLUCOSE-CAPILLARY: 131 mg/dL — AB (ref 65–99)
Glucose-Capillary: 140 mg/dL — ABNORMAL HIGH (ref 65–99)
Glucose-Capillary: 176 mg/dL — ABNORMAL HIGH (ref 65–99)

## 2015-05-05 LAB — CULTURE, RESPIRATORY W GRAM STAIN

## 2015-05-05 LAB — MAGNESIUM: Magnesium: 2.1 mg/dL (ref 1.7–2.4)

## 2015-05-05 LAB — PHOSPHORUS: Phosphorus: 2.2 mg/dL — ABNORMAL LOW (ref 2.5–4.6)

## 2015-05-05 LAB — TRIGLYCERIDES: TRIGLYCERIDES: 157 mg/dL — AB (ref <150)

## 2015-05-05 MED ORDER — INSULIN GLARGINE 100 UNIT/ML ~~LOC~~ SOLN
15.0000 [IU] | Freq: Two times a day (BID) | SUBCUTANEOUS | Status: DC
  Administered 2015-05-05 – 2015-05-06 (×3): 15 [IU] via SUBCUTANEOUS
  Filled 2015-05-05 (×4): qty 0.15

## 2015-05-05 MED ORDER — DILTIAZEM LOAD VIA INFUSION
15.0000 mg | Freq: Once | INTRAVENOUS | Status: AC
  Administered 2015-05-05: 15 mg via INTRAVENOUS
  Filled 2015-05-05: qty 15

## 2015-05-05 MED ORDER — DILTIAZEM HCL 100 MG IV SOLR
5.0000 mg/h | INTRAVENOUS | Status: DC
  Administered 2015-05-05: 5 mg/h via INTRAVENOUS
  Administered 2015-05-05 – 2015-05-09 (×8): 15 mg/h via INTRAVENOUS
  Filled 2015-05-05 (×15): qty 100

## 2015-05-05 MED ORDER — POTASSIUM PHOSPHATES 15 MMOLE/5ML IV SOLN
10.0000 mmol | Freq: Once | INTRAVENOUS | Status: AC
  Administered 2015-05-05: 10 mmol via INTRAVENOUS
  Filled 2015-05-05: qty 3.33

## 2015-05-05 NOTE — Progress Notes (Signed)
Pharmacy Antibiotic Follow-up Note  Victoria Morrow is a 65 y.o. year-old female admitted on 04/28/2015.  The patient is currently on day 7 of meropenem for UTI and possible necrosis of pancreas.  Assessment/Plan:  This patient's current antibiotics will be continued without adjustments.  Per MD note, was to stop abx yesterday, but hesitant to recommend stopping today given new fevers and worsening leukocytosis.  Without abscess or fluid collection, normal renally-adjusted dosing should be sufficient. Await MD assessment.  If patient continues to worsen tomorrow and renal function stable/improved, will increase dose.  May need to consider alternate etiology/microbiology if infection not resolving on broad spectrum antibacterials  Temp (24hrs), Avg:99.4 F (37.4 C), Min:97.9 F (36.6 C), Max:101 F (38.3 C)   Recent Labs Lab 05/01/15 0340 05/02/15 0925 05/03/15 0430 05/04/15 0337 05/05/15 0344  WBC 17.8* 13.8* 18.4* 14.3* 22.0*    Recent Labs Lab 05/02/15 1710 05/03/15 0430 05/03/15 1600 05/04/15 0337 05/05/15 0344  CREATININE 2.33* 2.33* 2.22* 2.28* 1.85*   Estimated Creatinine Clearance: 27.5 mL/min (by C-G formula based on Cr of 1.85).    No Known Allergies  Antimicrobials this admission: 1/1 >> Primaxin  >> 1/3 1/3 >> Rocephin >> 1/4 1/3 >> Flagyl >> 1/5 1/4 >> Primaxin >> 1/5 1/5 >> meropenem >> 1/6 >> PO vanc >> 1/7  Levels/dose changes this admission: Meropenem dose reduced for renal function  Microbiology Results: 1/1 BCx: ngtd 1/1 MRSA PCR: neg 1/2 urine: >100k Klebsiella pneumo (R-amp) 1/5 blood: 1/2 ngtd, other canceled 1/5 trach asp: ngtd 1/6 C diff: neg  Thank you for allowing pharmacy to be a part of this patient's care.  Gustin Zobrist A PharmD 05/05/2015 10:54 AM

## 2015-05-05 NOTE — Progress Notes (Signed)
RN called E-link MD about patient's increase in HR 135-145 and respiratory rate 35-45; No further orders; Will continue to monitor.

## 2015-05-05 NOTE — Progress Notes (Signed)
[facility]  [address] [place], [place] [address] Phone: [phone] FAX: [phone]   AMARIONNA ARCA [id number] December 18, 1950   Problem List:   Principal Problem:   Acute necrotizing pancreatitis with SIRS Active Problems:   Gallstones   Transaminitis   DM type 2 (diabetes mellitus, type 2) ([place])   Hyperlipidemia   ARF (acute renal failure) (HCC)   Acute respiratory failure with hypoxia (HCC)   Aspiration pneumonia due to vomit ([place])   Acute on chronic respiratory failure (HCC)   Protein-calorie malnutrition, moderate (HCC)   Umbilical hernia   Chronic kidney disease (CKD), stage III (moderate)   Olivopontocerebellar atrophy (HCC)      * No surgery found *    Assessment  GUARDED With severe pancreatitis    Plan:  -sips - seriously doubt PO going to work with massive distention  -start TNA for expected poor PO intake given massive distention & necrotic pancreatitis  -at some point would benefit from cholecystectomy (although gallstone pancreatitis is rarely this severe & need medice/PCCM due diligence to r/o other etiologies).  Would wait on chole until SIRS resolved & much more stable from pancreatitis  -DM control - challenge with pancreatitis & TNA   -ARF nonoliguric & improving - follow  -ileus resolving - follow.  Try gentle bowel regimen.  D/c rectal tube soon  -VTE prophylaxis- SCDs, etc  -delerium w ICU & severe pancreatitis & Olivopontocerebellar atrophy - follow  -mobilize as tolerated to help recovery  Adin Hector, M.D., F.A.C.S. Gastrointestinal and Minimally Invasive Surgery [facility] [address] [place], [place] [address] [phone] Main / Paging   05/05/2015  Subjective:  Less bloated Confused - better w sedation Inc HR ST - diltiazem started Tolerating a few sips - prefers ice chips  Objective:  Vital signs:  Filed Vitals:    05/05/15 0100 05/05/15 0200 05/05/15 0400 05/05/15 0600  BP: 128/77 144/88 135/77 118/73  Pulse: 132  136 132  Temp:   101 F (38.3 C)   TempSrc:   Axillary   Resp: 37 38 41 41  Height:      Weight:      SpO2: 100%  100% 100%    Last BM Date: 05/04/15  Intake/Output   Yesterday:  01/07 0701 - 01/08 0700 In: 3301.2 [I.V.:1951.2; IV Piggyback:1350] Out: 2485 [Urine:1235; Stool:1250] This shift:     Bowel function:  Flatus: y  BM: loose  Drain: n/a  Physical Exam:  General: Pt awake/alert/oriented x1 in mild acute distress Eyes: PERRL, normal EOM.  Sclera clear.  No icterus Neuro: CN II-XII intact w/o focal sensory/motor deficits. Lymph: No head/neck/groin lymphadenopathy Psych:  No delerium/psychosis/paranoia.  Mildly confused but consolable HENT: Normocephalic, Mucus membranes moist.  No thrush Neck: Supple, No tracheal deviation Chest: No chest wall pain w good excursion CV:  Pulses intact.  Regular rhythm MS: Normal AROM mjr joints.  No obvious deformity Abdomen: Softer.  Moderately distended.  Uncomfortable but no pain.  No evidence of peritonitis.  Soft small umb hernia.  No incarcerated hernias. Ext:  SCDs BLE.  No mjr edema.  No cyanosis Skin: No petechiae / purpura  Results:   Labs: Results for orders placed or performed during the hospital encounter of 04/28/15 (from the past 48 hour(s))  Glucose, capillary     Status: Abnormal   Collection Time: 05/03/15  7:38 AM  Result Value Ref Range   Glucose-Capillary 158 (H) 65 -  99 mg/dL   Comment 1 Notify RN    Comment 2 Document in Chart   C difficile quick scan w PCR reflex     Status: None   Collection Time: 05/03/15 10:30 AM  Result Value Ref Range   C Diff antigen NEGATIVE NEGATIVE   C Diff toxin NEGATIVE NEGATIVE   C Diff interpretation Negative for toxigenic C. difficile     Comment: CORRECTED ON 01/06 AT 1423: PREVIOUSLY REPORTED AS NEGATIVE  Glucose, capillary     Status: Abnormal   Collection  Time: 05/03/15 12:08 PM  Result Value Ref Range   Glucose-Capillary 140 (H) 65 - 99 mg/dL   Comment 1 Notify RN    Comment 2 Document in Chart   Glucose, capillary     Status: Abnormal   Collection Time: 05/03/15  3:32 PM  Result Value Ref Range   Glucose-Capillary 214 (H) 65 - 99 mg/dL   Comment 1 Notify RN    Comment 2 Document in Chart   Basic metabolic panel     Status: Abnormal   Collection Time: 05/03/15  4:00 PM  Result Value Ref Range   Sodium 144 135 - 145 mmol/L   Potassium 3.1 (L) 3.5 - 5.1 mmol/L   Chloride 111 101 - 111 mmol/L   CO2 22 22 - 32 mmol/L   Glucose, Bld 272 (H) 65 - 99 mg/dL   BUN 37 (H) 6 - 20 mg/dL   Creatinine, Ser 2.22 (H) 0.44 - 1.00 mg/dL   Calcium 6.9 (L) 8.9 - 10.3 mg/dL   GFR calc non Af Amer 22 (L) >60 mL/min   GFR calc Af Amer 25 (L) >60 mL/min    Comment: (NOTE) The eGFR has been calculated using the CKD EPI equation. This calculation has not been validated in all clinical situations. eGFR's persistently <60 mL/min signify possible Chronic Kidney Disease.    Anion gap 11 5 - 15  Glucose, capillary     Status: Abnormal   Collection Time: 05/03/15  7:57 PM  Result Value Ref Range   Glucose-Capillary 207 (H) 65 - 99 mg/dL  Glucose, capillary     Status: Abnormal   Collection Time: 05/03/15 11:25 PM  Result Value Ref Range   Glucose-Capillary 195 (H) 65 - 99 mg/dL  Basic metabolic panel     Status: Abnormal   Collection Time: 05/04/15  3:37 AM  Result Value Ref Range   Sodium 145 135 - 145 mmol/L   Potassium 3.2 (L) 3.5 - 5.1 mmol/L   Chloride 113 (H) 101 - 111 mmol/L   CO2 20 (L) 22 - 32 mmol/L   Glucose, Bld 273 (H) 65 - 99 mg/dL   BUN 37 (H) 6 - 20 mg/dL   Creatinine, Ser 2.28 (H) 0.44 - 1.00 mg/dL   Calcium 7.6 (L) 8.9 - 10.3 mg/dL   GFR calc non Af Amer 21 (L) >60 mL/min   GFR calc Af Amer 25 (L) >60 mL/min    Comment: (NOTE) The eGFR has been calculated using the CKD EPI equation. This calculation has not been validated in  all clinical situations. eGFR's persistently <60 mL/min signify possible Chronic Kidney Disease.    Anion gap 12 5 - 15  CBC with Differential/Platelet     Status: Abnormal   Collection Time: 05/04/15  3:37 AM  Result Value Ref Range   WBC 14.3 (H) 4.0 - 10.5 K/uL   RBC 3.64 (L) 3.87 - 5.11 MIL/uL   Hemoglobin 10.3 (L) 12.0 -  15.0 g/dL   HCT 30.8 (L) 36.0 - 46.0 %   MCV 84.6 78.0 - 100.0 fL   MCH 28.3 26.0 - 34.0 pg   MCHC 33.4 30.0 - 36.0 g/dL   RDW 13.1 11.5 - 15.5 %   Platelets 292 150 - 400 K/uL   Neutrophils Relative % 80 %   Neutro Abs 11.4 (H) 1.7 - 7.7 K/uL   Lymphocytes Relative 12 %   Lymphs Abs 1.7 0.7 - 4.0 K/uL   Monocytes Relative 8 %   Monocytes Absolute 1.1 (H) 0.1 - 1.0 K/uL   Eosinophils Relative 0 %   Eosinophils Absolute 0.1 0.0 - 0.7 K/uL   Basophils Relative 0 %   Basophils Absolute 0.0 0.0 - 0.1 K/uL  Lipase, blood     Status: Abnormal   Collection Time: 05/04/15  3:37 AM  Result Value Ref Range   Lipase 53 (H) 11 - 51 U/L  Magnesium     Status: None   Collection Time: 05/04/15  3:37 AM  Result Value Ref Range   Magnesium 1.9 1.7 - 2.4 mg/dL  Glucose, capillary     Status: Abnormal   Collection Time: 05/04/15  3:46 AM  Result Value Ref Range   Glucose-Capillary 222 (H) 65 - 99 mg/dL  Glucose, capillary     Status: Abnormal   Collection Time: 05/04/15  7:39 AM  Result Value Ref Range   Glucose-Capillary 210 (H) 65 - 99 mg/dL   Comment 1 Notify RN    Comment 2 Document in Chart   Glucose, capillary     Status: Abnormal   Collection Time: 05/04/15 11:38 AM  Result Value Ref Range   Glucose-Capillary 228 (H) 65 - 99 mg/dL  Glucose, capillary     Status: Abnormal   Collection Time: 05/04/15  4:01 PM  Result Value Ref Range   Glucose-Capillary 239 (H) 65 - 99 mg/dL  Glucose, capillary     Status: Abnormal   Collection Time: 05/04/15  7:24 PM  Result Value Ref Range   Glucose-Capillary 170 (H) 65 - 99 mg/dL  Basic metabolic panel     Status:  Abnormal   Collection Time: 05/05/15  3:44 AM  Result Value Ref Range   Sodium 148 (H) 135 - 145 mmol/L   Potassium 3.5 3.5 - 5.1 mmol/L   Chloride 115 (H) 101 - 111 mmol/L   CO2 19 (L) 22 - 32 mmol/L   Glucose, Bld 180 (H) 65 - 99 mg/dL   BUN 31 (H) 6 - 20 mg/dL   Creatinine, Ser 1.85 (H) 0.44 - 1.00 mg/dL   Calcium 8.3 (L) 8.9 - 10.3 mg/dL   GFR calc non Af Amer 27 (L) >60 mL/min   GFR calc Af Amer 32 (L) >60 mL/min    Comment: (NOTE) The eGFR has been calculated using the CKD EPI equation. This calculation has not been validated in all clinical situations. eGFR's persistently <60 mL/min signify possible Chronic Kidney Disease.    Anion gap 14 5 - 15  CBC     Status: Abnormal   Collection Time: 05/05/15  3:44 AM  Result Value Ref Range   WBC 22.0 (H) 4.0 - 10.5 K/uL   RBC 3.48 (L) 3.87 - 5.11 MIL/uL   Hemoglobin 9.9 (L) 12.0 - 15.0 g/dL   HCT 29.8 (L) 36.0 - 46.0 %   MCV 85.6 78.0 - 100.0 fL   MCH 28.4 26.0 - 34.0 pg   MCHC 33.2 30.0 - 36.0 g/dL  RDW 13.1 11.5 - 15.5 %   Platelets 410 (H) 150 - 400 K/uL  Magnesium     Status: None   Collection Time: 05/05/15  3:44 AM  Result Value Ref Range   Magnesium 2.1 1.7 - 2.4 mg/dL  Phosphorus     Status: Abnormal   Collection Time: 05/05/15  3:44 AM  Result Value Ref Range   Phosphorus 2.2 (L) 2.5 - 4.6 mg/dL    Imaging / Studies: No results found.  Medications / Allergies: per chart  Antibiotics: Anti-infectives    Start     Dose/Rate Route Frequency Ordered Stop   05/03/15 1200  vancomycin (VANCOCIN) 50 mg/mL oral solution 500 mg  Status:  Discontinued     500 mg Oral 4 times per day 05/03/15 0917 05/04/15 1306   05/02/15 1200  cefTRIAXone (ROCEPHIN) 1 g in dextrose 5 % 50 mL IVPB  Status:  Discontinued     1 g 100 mL/hr over 30 Minutes Intravenous Every 24 hours 05/01/15 1448 05/01/15 2004   05/02/15 0600  imipenem-cilastatin (PRIMAXIN) 250 mg in sodium chloride 0.9 % 100 mL IVPB  Status:  Discontinued     250  mg 200 mL/hr over 30 Minutes Intravenous 3 times per day 05/01/15 2025 05/02/15 0027   05/02/15 0030  meropenem (MERREM) 500 mg in sodium chloride 0.9 % 50 mL IVPB     500 mg 100 mL/hr over 30 Minutes Intravenous Every 12 hours 05/02/15 0027     05/01/15 2030  imipenem-cilastatin (PRIMAXIN) 250 mg in sodium chloride 0.9 % 100 mL IVPB     250 mg 200 mL/hr over 30 Minutes Intravenous NOW 05/01/15 2025 05/01/15 2110   04/30/15 1100  metroNIDAZOLE (FLAGYL) IVPB 500 mg  Status:  Discontinued     500 mg 100 mL/hr over 60 Minutes Intravenous Every 12 hours 04/30/15 0951 05/02/15 0751   04/30/15 1000  cefTRIAXone (ROCEPHIN) 1 g in dextrose 5 % 50 mL IVPB  Status:  Discontinued     1 g 100 mL/hr over 30 Minutes Intravenous Every 24 hours 04/30/15 0939 05/01/15 1205   04/30/15 0945  cefTRIAXone (ROCEPHIN) injection 1 g  Status:  Discontinued     1 g Intramuscular Every 24 hours 04/30/15 0930 04/30/15 0938   04/29/15 0200  imipenem-cilastatin (PRIMAXIN) 250 mg in sodium chloride 0.9 % 100 mL IVPB  Status:  Discontinued     250 mg 200 mL/hr over 30 Minutes Intravenous Every 8 hours 04/28/15 1812 04/30/15 0930   04/28/15 1830  imipenem-cilastatin (PRIMAXIN) 250 mg in sodium chloride 0.9 % 100 mL IVPB     250 mg 200 mL/hr over 30 Minutes Intravenous  Once 04/28/15 1812 04/28/15 1904        Note: Portions of this report may have been transcribed using voice recognition software. Every effort was made to ensure accuracy; however, inadvertent computerized transcription errors may be present.   Any transcriptional errors that result from this process are unintentional.     Adin Hector, M.D., F.A.C.S. Gastrointestinal and Minimally Invasive Surgery [facility] [address] [place], [place] [address] [phone] Main / Paging   05/05/2015  CARE TEAM:  PCP: Rachell Cipro, MD  Outpatient Care Team: Patient Care Team: Fanny Bien, MD as PCP -  General (Family Medicine)  Inpatient Treatment Team: Treatment Team: Attending Provider: Juanito Doom, MD; Rounding Team: Nolon Nations, MD; Rounding Team: Md Pccm, MD

## 2015-05-05 NOTE — Progress Notes (Signed)
eLink Physician-Brief Progress Note Patient Name: Victoria Morrow DOB: [birth year] MRN: [id number]   Date of Service  05/05/2015  HPI/Events of Note  Irregular, narrow complex tachycardia.  Maintaining blood pressure.  K 3.5, Mg 2.1, Ph 2.2.   eICU Interventions  Will start cardizem.  Will give Kphos.     Intervention Category Major Interventions: Other:  Victoria Morrow 05/05/2015, 5:02 AM

## 2015-05-05 NOTE — Progress Notes (Signed)
PULMONARY / CRITICAL CARE MEDICINE   Name: Victoria Morrow MRN: [id number] DOB: [birth year]    ADMISSION DATE:  04/28/2015 CONSULTATION DATE: 1/2  REFERRING MD:  Rito EhrlichKrishnan  CHIEF COMPLAINT:  Hypotension and pancreatitis   BRIEF DESCRIPTION:   65 year old female admitted on 1/1 with severe acute pancreatitis believed to be related to gallstones but no CBD or pancreatic duct dilation on admission.  Takes ACE inhibitor baseline.  Had mild gall bladder wall thickening on ultrasound with 5mm stone in neck of gallbladder.  Aspirated on 1/4, requiring intubation, extubated 1/5. Fever 1/5.   SUBJECTIVE:  Confused overnight SVT requiring cardizem gtt Improved  abd pain Loose stools Low gr febrile   VITAL SIGNS: BP 129/69 mmHg  Pulse 119  Temp(Src) 100.7 F (38.2 C) (Axillary)  Resp 37  Ht 5\' 2"  (1.575 m)  Wt 70.5 kg (155 lb 6.8 oz)  BMI 28.42 kg/m2  SpO2 100%  HEMODYNAMICS:    VENTILATOR SETTINGS:    INTAKE / OUTPUT: I/O last 3 completed shifts: In: 4379.7 [I.V.:2979.7; IV Piggyback:1400] Out: 4560 [Urine:1810; Stool:2750]  PHYSICAL EXAMINATION: General:  Awake, alert, conversant HENT: NCAT, no JVD PULM: CTA B, normal effort CV: RRR, no mgr GI: Belly soft, mild diffuse tenderness MSK: normal bulk and tone Neuro: awake, alert, no distress  LABS:  BMET  Recent Labs Lab 05/03/15 1600 05/04/15 0337 05/05/15 0344  NA 144 145 148*  K 3.1* 3.2* 3.5  CL 111 113* 115*  CO2 22 20* 19*  BUN 37* 37* 31*  CREATININE 2.22* 2.28* 1.85*  GLUCOSE 272* 273* 180*    Electrolytes  Recent Labs Lab 05/03/15 1600 05/04/15 0337 05/05/15 0344  CALCIUM 6.9* 7.6* 8.3*  MG  --  1.9 2.1  PHOS  --   --  2.2*    CBC  Recent Labs Lab 05/03/15 0430 05/04/15 0337 05/05/15 0344  WBC 18.4* 14.3* 22.0*  HGB 9.8* 10.3* 9.9*  HCT 29.0* 30.8* 29.8*  PLT 333 292 410*    Coag's  Recent Labs Lab 04/28/15 1820  APTT 27  INR 1.04    Sepsis Markers  Recent Labs Lab  04/28/15 1705 04/28/15 1820 04/28/15 2123  LATICACIDVEN 1.74 2.0 1.9  PROCALCITON  --  11.43  --     ABG  Recent Labs Lab 05/01/15 1547 05/01/15 2100 05/02/15 0435  PHART 7.431 7.429 7.448  PCO2ART 20.9* 27.6* 27.8*  PO2ART 203* 123* 79.9*    Liver Enzymes  Recent Labs Lab 04/29/15 0350 04/30/15 0336 05/01/15 0340  AST 94* 77* 44*  ALT 66* 45 28  ALKPHOS 65 53 55  BILITOT 1.1 1.2 1.0  ALBUMIN 3.2* 2.6* 2.3*    Cardiac Enzymes No results for input(s): TROPONINI, PROBNP in the last 168 hours.  Glucose  Recent Labs Lab 05/03/15 2325 05/04/15 0346 05/04/15 0739 05/04/15 1138 05/04/15 1601 05/04/15 1924  GLUCAP 195* 222* 210* 228* 239* 170*    Imaging  STUDIES:  CT abd 1/1 1. Upper abdominal edema, eccentric right. Centered about the pancreatic head, uncinate process, and neck. Favor pancreatitis. Correlate with pancreatic enzymes (pending). If these are abnormal, less likely differential consideration would include peptic ulcer Disease. 2. Cholelithiasis without specific evidence of cholecystitis or biliary duct dilatation. 3. Trace perihepatic ascites, likely secondary. 4. Small hiatal hernia. 5. Pelvic floor laxity. 6. Renal atrophy. US abd 1/1: 1. Gallbladder wall thickening, with mild pericholecystic fluid.This is nonspecific in the presence of the patient's known pancreatitis. Stones noted within the gallbladder, with a 5 mm  stone lodged at the neck of the gallbladder. Mild acute cholecystitis cannot be entirely excluded, but is considered less likely. 2. No evidence for bile duct obstruction. The patient's apparent duodenal diverticulum is not well characterized on ultrasound. CT abdomen/pelvis 1/5 > necrotic pancreas most likely, limited by lack of contrast, no cholecystitis, small stones, prox small bowel dilation, likely ileus, increased bilateral atelectasis/effusions, some peritoneal fluid  1/6 CXR images personally reviewed> low lung volumes, L IJ in  place, bilateral atelectasis, no significant airspace disease  CULTURES: BCX2 1/1>>> neg UC 1/1>>> Klebsiella, amp resistant BC x2 1/5 > ng Resp cx 1/5 >  C diff 1/6 > neg  ANTIBIOTICS: Imipenem 1/1>>> 1/3 Ceftriaxone 1/3 > 1/4 Flagyl 1/3 > 1/5 Meropenem 1/4 >  Oral Vanc 1/6>   SIGNIFICANT EVENTS: 1/4 vomited, aspirated, intubated, shock 1/5 extubated  LINES/TUBES: 1/4 ETT > 1/5 1/4 L IJ CVL >   DISCUSSION: 65 year old female admitted severe acute pancreatitis, presumably gallstone related but no obstructing stone seen on imaging aside from one in neck of gallbladder.  Aspirated on 1/4, requiring intubation and central line placement.  CT abdomen on 1/4 shows likely pancreatic necrosis. Extubated 1/5, diarrhea overnight.  ASSESSMENT / PLAN:  PULMONARY A: Acute respiratory failure with hypoxemia > resolved P:   Incentive spirometry Flutter prn Prn xopenex  CARDIOVASCULAR A:  Shock> Still on neo but blood MAP > 70 Transient Afib 1/6, 1/7 - now in s tach P:  Ct cardizem gtt Off Neo   RENAL A:   AKI, UOP OK > improving Hyperchloremia  Hypernatremia  Hypocalcemia Hypokalemia P:   Ct 1/2 NS with KCL Replete lytes I&O strict   GASTROINTESTINAL A:   Acute pancreatitis--> likely necrosis ? etio  Gall bladder wall thickening, GB stone but neg Murphy's sign 1/3 Ileus> improved Diarrhea  P:   npo, aspiration precautions Start TNA once PICC placed Prn zofran Cholecystectomy eventually per gen surg     HEMATOLOGIC A:   No acute issues P:  Trend CBC Transfuse per ICU protocol  [place] heparin   INFECTIOUS A:   SIRS due to pancreatitis UTI> Klebiella WBC up 1/6, diarrhea, multiple antibiotics, at risk for C diff P:   Continue meropenem x 10 ds total -1/11 Dc oral vanc   ENDOCRINE A:   Hyperglycemia P:   ICU glycemic control protocol   NEUROLOGIC A:   Pain control P:   Fentanyl prn   FAMILY  - Updates: husband updated 1/8 AM  bedside  - Inter-disciplinary family meet or Palliative Care meeting due by:  NA  Summary - Etiology of acute pancreatitis not clear, recovering organ failure, needs nutrition   The patient is critically ill with multiple organ systems failure and requires high complexity decision making for assessment and support, frequent evaluation and titration of therapies, application of advanced monitoring technologies and extensive interpretation of multiple databases. Critical Care Time devoted to patient care services described in this note independent of APP time is 32 minutes.   Cyril Mourning MD. Tonny Bollman. [facility] Pager [phone] If no response call [phone]    05/05/2015, 10:55 AM

## 2015-05-05 NOTE — Progress Notes (Addendum)
Pharmacy - TPN  Assessment:  [age] yoF ordered TPN d/t expected prolonged poor intake with pancreatitis and abd distention  No central line present; PICC attempted to place yesterday but unable d/t poor veins.  Deferred PICC placement to IR who will not see patient until Monday  Plan:  F/u placement of PICC and begin TPN as able (hopefully tomorrow, 1/9)  Bernadene Personrew Srihari Shellhammer, PharmD, BCPS Pager: [phone] 05/05/2015, 9:41 AM

## 2015-05-05 NOTE — Progress Notes (Signed)
Nutrition Follow-up  INTERVENTION:   Monitor magnesium, potassium, and phosphorus daily for at least 3 days, MD to replete as needed, as pt is at risk for refeeding syndrome given poor PO intake/ NPO >1 week.  -TPN per Pharmacy once PICC placed -Recommended estimated needs below. -RD to continue to monitor  NUTRITION DIAGNOSIS:   Inadequate oral intake related to inability to eat as evidenced by NPO status.  Ongoing.  GOAL:   Patient will meet greater than or equal to 90% of their needs  Not meeting.  MONITOR:   Diet advancement, Labs, Weight trends, Skin, I & O's, Other (Comment) (TPN initiation)  REASON FOR ASSESSMENT:   Consult New TPN/TNA  ASSESSMENT:   Ave Filternn C Reich is a 65 y.o. female with a past medical history of type 2 diabetes on insulin, chronic kidney disease of unknown stage, hyperlipidemia, who was in her usual state of health till earlier today when she started developing nausea followed by vomiting. She also started having upper abdominal pain. Symptoms got worse and she decided to come into the hospital for further evaluation. She denies having had similar symptoms in the past. Pain is located in the upper abdomen without any radiation. It's achy pain 10 out of 10 in intensity. No aggravating or relieving factors. No known precipitant factors. Denies any recent antibiotic use. No sick contacts. No recent hospitalization. Denies any fever but has had chills. Denies any blood in the emesis or in the stool. 1/5: extubated  Pt to begin TPN once PICC is able to be placed. IR to place Monday 1/9. Pt with poor PO intake since 1/1 and possibly prior to that. May be at risk for refeeding, monitor.  Labs reviewed: CBGs: 170-239  Diet Order:  Diet NPO time specified Except for: Ice Chips  Skin:  Reviewed, no issues  Last BM:  1/7  Height:   Ht Readings from Last 1 Encounters:  05/01/15 5\' 2"  (1.575 m)    Weight:   Wt Readings from Last 1 Encounters:   05/02/15 155 lb 6.8 oz (70.5 kg)    Ideal Body Weight:  50 kg  BMI:  Body mass index is 28.42 kg/(m^2).  Estimated Nutritional Needs:   Kcal:  1800-2000  Protein:  85-95 grams  Fluid:  2-2.2 L/day  EDUCATION NEEDS:   No education needs identified at this time  Tilda FrancoLindsey Marieclaire Bettenhausen, MS, RD, LDN Pager: [phone] After Hours Pager: [phone]

## 2015-05-05 NOTE — Progress Notes (Deleted)
eLink Physician-Brief Progress Note Patient Name: Ave Filternn C Pennick DOB: 1950-08-06 MRN: [id number]   Date of Service  05/05/2015  HPI/Events of Note  Severe agitn, biting ETT on camera  eICU Interventions  On fent gtt Needing freq versed Add propofol gtt     Intervention Category Major Interventions: Delirium, psychosis, severe agitation - evaluation and management  Nikaela Coyne V. 05/05/2015, 3:57 PM

## 2015-05-06 ENCOUNTER — Inpatient Hospital Stay (HOSPITAL_COMMUNITY): Payer: Commercial Managed Care - HMO

## 2015-05-06 LAB — DIFFERENTIAL
BASOS PCT: 0 %
Basophils Absolute: 0 10*3/uL (ref 0.0–0.1)
EOS PCT: 0 %
Eosinophils Absolute: 0 10*3/uL (ref 0.0–0.7)
LYMPHS PCT: 6 %
Lymphs Abs: 1.5 10*3/uL (ref 0.7–4.0)
MONO ABS: 1.3 10*3/uL — AB (ref 0.1–1.0)
MONOS PCT: 5 %
Neutro Abs: 22.8 10*3/uL — ABNORMAL HIGH (ref 1.7–7.7)
Neutrophils Relative %: 89 %

## 2015-05-06 LAB — CBC
HEMATOCRIT: 27.5 % — AB (ref 36.0–46.0)
HEMOGLOBIN: 9.2 g/dL — AB (ref 12.0–15.0)
MCH: 28.4 pg (ref 26.0–34.0)
MCHC: 33.5 g/dL (ref 30.0–36.0)
MCV: 84.9 fL (ref 78.0–100.0)
PLATELETS: 498 10*3/uL — AB (ref 150–400)
RBC: 3.24 MIL/uL — AB (ref 3.87–5.11)
RDW: 13.2 % (ref 11.5–15.5)
WBC: 25.6 10*3/uL — AB (ref 4.0–10.5)

## 2015-05-06 LAB — COMPREHENSIVE METABOLIC PANEL
ALBUMIN: 2.1 g/dL — AB (ref 3.5–5.0)
ALT: 25 U/L (ref 14–54)
ANION GAP: 10 (ref 5–15)
AST: 32 U/L (ref 15–41)
Alkaline Phosphatase: 92 U/L (ref 38–126)
BILIRUBIN TOTAL: 0.8 mg/dL (ref 0.3–1.2)
BUN: 29 mg/dL — AB (ref 6–20)
CHLORIDE: 120 mmol/L — AB (ref 101–111)
CO2: 18 mmol/L — AB (ref 22–32)
Calcium: 8.8 mg/dL — ABNORMAL LOW (ref 8.9–10.3)
Creatinine, Ser: 1.79 mg/dL — ABNORMAL HIGH (ref 0.44–1.00)
GFR calc Af Amer: 33 mL/min — ABNORMAL LOW (ref >60)
GFR calc non Af Amer: 28 mL/min — ABNORMAL LOW (ref >60)
GLUCOSE: 175 mg/dL — AB (ref 65–99)
POTASSIUM: 3.5 mmol/L (ref 3.5–5.1)
SODIUM: 148 mmol/L — AB (ref 135–145)
Total Protein: 6 g/dL — ABNORMAL LOW (ref 6.5–8.1)

## 2015-05-06 LAB — GLUCOSE, CAPILLARY
GLUCOSE-CAPILLARY: 129 mg/dL — AB (ref 65–99)
GLUCOSE-CAPILLARY: 130 mg/dL — AB (ref 65–99)
GLUCOSE-CAPILLARY: 158 mg/dL — AB (ref 65–99)
GLUCOSE-CAPILLARY: 165 mg/dL — AB (ref 65–99)
Glucose-Capillary: 208 mg/dL — ABNORMAL HIGH (ref 65–99)

## 2015-05-06 LAB — TRIGLYCERIDES: Triglycerides: 139 mg/dL (ref <150)

## 2015-05-06 LAB — MAGNESIUM: Magnesium: 2.1 mg/dL (ref 1.7–2.4)

## 2015-05-06 LAB — PHOSPHORUS: Phosphorus: 2.8 mg/dL (ref 2.5–4.6)

## 2015-05-06 LAB — PREALBUMIN: PREALBUMIN: 6.9 mg/dL — AB (ref 18–38)

## 2015-05-06 MED ORDER — DILTIAZEM HCL 100 MG IV SOLR
INTRAVENOUS | Status: AC
  Filled 2015-05-06: qty 100

## 2015-05-06 MED ORDER — POTASSIUM CHLORIDE IN NACL 20-0.45 MEQ/L-% IV SOLN
INTRAVENOUS | Status: DC
  Administered 2015-05-06: 17:00:00 via INTRAVENOUS
  Filled 2015-05-06: qty 1000

## 2015-05-06 MED ORDER — TRACE MINERALS CR-CU-MN-SE-ZN 10-1000-500-60 MCG/ML IV SOLN
INTRAVENOUS | Status: AC
  Administered 2015-05-06: 17:00:00 via INTRAVENOUS
  Filled 2015-05-06: qty 840

## 2015-05-06 MED ORDER — LIDOCAINE HCL 1 % IJ SOLN
INTRAMUSCULAR | Status: AC
  Filled 2015-05-06: qty 20

## 2015-05-06 MED ORDER — FAT EMULSION 20 % IV EMUL
120.0000 mL | INTRAVENOUS | Status: AC
  Administered 2015-05-06: 120 mL via INTRAVENOUS
  Filled 2015-05-06: qty 250

## 2015-05-06 MED ORDER — INSULIN GLARGINE 100 UNIT/ML ~~LOC~~ SOLN
10.0000 [IU] | Freq: Two times a day (BID) | SUBCUTANEOUS | Status: DC
  Administered 2015-05-06 – 2015-05-07 (×2): 10 [IU] via SUBCUTANEOUS
  Filled 2015-05-06 (×3): qty 0.1

## 2015-05-06 NOTE — Procedures (Signed)
RUE PICC 38 cm No comp/EBL

## 2015-05-06 NOTE — Progress Notes (Signed)
Date: May 06, 2015 Chart reviewed for concurrent status and case management needs. Will continue to follow patient for changes and needs: Remains on Iv Cardizem drip Marcelle Smilinghonda Shamiya Demeritt, RN, BSN, [place]   [phone]

## 2015-05-06 NOTE — Progress Notes (Signed)
PULMONARY / CRITICAL CARE MEDICINE   Name: Victoria Morrow MRN: [id number] DOB: 29-Jun-1950    ADMISSION DATE:  04/28/2015 CONSULTATION DATE: 1/2  REFERRING MD:  Rito EhrlichKrishnan  CHIEF COMPLAINT:  Hypotension and pancreatitis   BRIEF DESCRIPTION:   65 year old female admitted on 1/1 with severe acute pancreatitis believed to be related to gallstones but no CBD or pancreatic duct dilation on admission.  Takes ACE inhibitor baseline.  Had mild gall bladder wall thickening on ultrasound with 5mm stone in neck of gallbladder.  Aspirated on 1/4, requiring intubation, extubated 1/5. Fever 1/5.   SIGNIFICANT EVENTS: 1/4 vomited, aspirated, intubated, shock 1/5 extubated SVT requiring cardizem gtt  SUBJECTIVE:  Vomiting overnight Improved  abd pain with NG to LIS febrile   VITAL SIGNS: BP 129/63 mmHg  Pulse 112  Temp(Src) 98.1 F (36.7 C) (Axillary)  Resp 41  Ht 5\' 2"  (1.575 m)  Wt 70.5 kg (155 lb 6.8 oz)  BMI 28.42 kg/m2  SpO2 100%  HEMODYNAMICS:    VENTILATOR SETTINGS:    INTAKE / OUTPUT: I/O last 3 completed shifts: In: 5064.2 [I.V.:3289.2; Other:575; IV Piggyback:1200] Out: 2460 [Urine:1460; Stool:1000]  PHYSICAL EXAMINATION: General:  Awake, alert, conversant , mild resp distress HENT: NCAT, no JVD PULM: CTA B, normal effort CV: RRR, no mgr GI: Belly soft, mild diffuse tenderness MSK: normal bulk and tone Neuro: awake, alert, no distress  LABS:  BMET  Recent Labs Lab 05/04/15 0337 05/05/15 0344 05/06/15 0325  NA 145 148* 148*  K 3.2* 3.5 3.5  CL 113* 115* 120*  CO2 20* 19* 18*  BUN 37* 31* 29*  CREATININE 2.28* 1.85* 1.79*  GLUCOSE 273* 180* 175*    Electrolytes  Recent Labs Lab 05/04/15 0337 05/05/15 0344 05/06/15 0325  CALCIUM 7.6* 8.3* 8.8*  MG 1.9 2.1 2.1  PHOS  --  2.2* 2.8    CBC  Recent Labs Lab 05/04/15 0337 05/05/15 0344 05/06/15 0325  WBC 14.3* 22.0* 25.6*  HGB 10.3* 9.9* 9.2*  HCT 30.8* 29.8* 27.5*  PLT 292 410* 498*     Coag's No results for input(s): APTT, INR in the last 168 hours.  Sepsis Markers No results for input(s): LATICACIDVEN, PROCALCITON, O2SATVEN in the last 168 hours.  ABG  Recent Labs Lab 05/01/15 1547 05/01/15 2100 05/02/15 0435  PHART 7.431 7.429 7.448  PCO2ART 20.9* 27.6* 27.8*  PO2ART 203* 123* 79.9*    Liver Enzymes  Recent Labs Lab 04/30/15 0336 05/01/15 0340 05/06/15 0325  AST 77* 44* 32  ALT 45 28 25  ALKPHOS 53 55 92  BILITOT 1.2 1.0 0.8  ALBUMIN 2.6* 2.3* 2.1*    Cardiac Enzymes No results for input(s): TROPONINI, PROBNP in the last 168 hours.  Glucose  Recent Labs Lab 05/05/15 1210 05/05/15 1648 05/05/15 2010 05/06/15 0031 05/06/15 0429 05/06/15 0728  GLUCAP 248* 176* 131* 158* 129* 130*    Imaging  STUDIES:  CT abd 1/1 1. Upper abdominal edema, eccentric right. Centered about the pancreatic head, uncinate process, and neck. Favor pancreatitis. Correlate with pancreatic enzymes (pending). If these are abnormal, less likely differential consideration would include peptic ulcer Disease. 2. Cholelithiasis without specific evidence of cholecystitis or biliary duct dilatation. 3. Trace perihepatic ascites, likely secondary. 4. Small hiatal hernia. 5. Pelvic floor laxity. 6. Renal atrophy. US abd 1/1: 1. Gallbladder wall thickening, with mild pericholecystic fluid.This is nonspecific in the presence of the patient's known pancreatitis. Stones noted within the gallbladder, with a 5 mm stone lodged at the neck of  the gallbladder. Mild acute cholecystitis cannot be entirely excluded, but is considered less likely. 2. No evidence for bile duct obstruction. The patient's apparent duodenal diverticulum is not well characterized on ultrasound. CT abdomen/pelvis 1/5 > necrotic pancreas most likely, limited by lack of contrast, no cholecystitis, small stones, prox small bowel dilation, likely ileus, increased bilateral atelectasis/effusions, some peritoneal  fluid  1/9  CXR images personally reviewed> low lung volumes, bibasal atelectasis, no significant airspace disease  CULTURES: BCX2 1/1>>> neg UC 1/1>>> Klebsiella, amp resistant BC x2 1/5 > ng Resp cx 1/5 >  C diff 1/6 > neg  ANTIBIOTICS: Imipenem 1/1>>> 1/3 Ceftriaxone 1/3 > 1/4 Flagyl 1/3 > 1/5 Meropenem 1/4 >  Oral Vanc 1/6> 1/8    LINES/TUBES: 1/4 ETT > 1/5 1/4 L IJ CVL >   DISCUSSION: 65 year old female admitted severe acute pancreatitis, presumably gallstone related but no obstructing stone seen on imaging aside from one in neck of gallbladder.  Aspirated on 1/4, requiring intubation and central line placement.  CT abdomen on 1/4 shows likely pancreatic necrosis. Extubated 1/5, diarrhea overnight.  ASSESSMENT / PLAN:  PULMONARY A: Acute respiratory failure with hypoxemia > resolved P:   Incentive spirometry Flutter prn Prn xopenex  CARDIOVASCULAR A:  Shock> Still on neo but blood MAP > 70 Transient Afib 1/6, 1/7 - now in s tach P:  Ct cardizem gtt Off Neo   RENAL A:   AKI, UOP  improving Hyperchloremia  Hypernatremia  Hypocalcemia Hypokalemia P:   Ct 1/2 NS with KCL Replete lytes I&O strict   GASTROINTESTINAL A:   Acute pancreatitis--> likely necrosis ? etio  Gall bladder wall thickening, GB stone but neg Murphy's sign 1/3 Ileus> improved Diarrhea  P:   npo, aspiration precautions Start TNA once PICC placed Prn zofran Cholecystectomy eventually per gen surg   Consider rpt CT since rising WBC   HEMATOLOGIC A:   Anemia Leucocytosis P:  Trend CBC Transfuse per ICU protocol  [place] heparin   INFECTIOUS A:   SIRS due to pancreatitis UTI> Klebiella WBC up 1/6, diarrhea, multiple antibiotics, at risk for C diff P:   Continue meropenem x 10 ds total -1/11   ENDOCRINE A:   Hyperglycemia P:   ICU glycemic control protocol   NEUROLOGIC A:   Pain control P:   Fentanyl prn   FAMILY  - Updates: husband updated 1/8 AM  bedside  - Inter-disciplinary family meet or Palliative Care meeting due by:  NA  Summary - Etiology of acute pancreatitis not clear, recovering organ failure, but rising WBCs, needs nutrition   The patient is critically ill with multiple organ systems failure and requires high complexity decision making for assessment and support, frequent evaluation and titration of therapies, application of advanced monitoring technologies and extensive interpretation of multiple databases. Critical Care Time devoted to patient care services described in this note independent of APP time is 31 minutes.   Cyril Mourning MD. Tonny Bollman. [facility] Pager [phone] If no response call [phone]    05/06/2015, 10:32 AM

## 2015-05-06 NOTE — Progress Notes (Signed)
Subjective: She does not look any better this AM than last week.  NG cannister is full.    Objective: Vital signs in last 24 hours: Temp:  [98.1 F (36.7 C)-101.3 F (38.5 C)] 98.1 F (36.7 C) (01/09 0836) Pulse Rate:  [109-130] 112 (01/09 0902) Resp:  [27-49] 41 (01/09 0902) BP: (107-140)/(58-91) 129/63 mmHg (01/09 0902) SpO2:  [100 %] 100 % (01/09 0902) Last BM Date: 05/06/15 Vomited x 1 yesterday 1798 IV intake 505 urine 550 from rectal tube Fever ;yesterday again, better now, tachycardic, but BP OK Na 148,  Glucose 175, creatinine is up  Prealbumin is 6.9 WBC is rising Last CT done without contrast on 05/01/15. Intake/Output from previous day: 01/08 0701 - 01/09 0700 In: 2798 [I.V.:2073; IV Piggyback:150] Out: 1355 [Urine:805; Stool:550] Intake/Output this shift:    General appearance: alert, cooperative and looks like she feels terrible. GI: distended, no BS, not really complaining of pain this AM.    Lab Results:   Recent Labs  05/05/15 0344 05/06/15 0325  WBC 22.0* 25.6*  HGB 9.9* 9.2*  HCT 29.8* 27.5*  PLT 410* 498*    BMET  Recent Labs  05/05/15 0344 05/06/15 0325  NA 148* 148*  K 3.5 3.5  CL 115* 120*  CO2 19* 18*  GLUCOSE 180* 175*  BUN 31* 29*  CREATININE 1.85* 1.79*  CALCIUM 8.3* 8.8*   PT/INR No results for input(s): LABPROT, INR in the last 72 hours.   Recent Labs Lab 04/30/15 0336 05/01/15 0340 05/06/15 0325  AST 77* 44* 32  ALT 45 28 25  ALKPHOS 53 55 92  BILITOT 1.2 1.0 0.8  PROT 5.6* 5.4* 6.0*  ALBUMIN 2.6* 2.3* 2.1*     Lipase     Component Value Date/Time   LIPASE 53* 05/04/2015 0337     Studies/Results: Dg Chest Port 1 View  05/06/2015  CLINICAL DATA:  Acute respiratory failure. EXAM: PORTABLE CHEST 1 VIEW COMPARISON:  05/03/2015. FINDINGS: Interim removal of left IJ line . NG tube noted coiled in the stomach. Mediastinum hilar structures normal. Persistent bibasilar atelectasis. No pleural effusion or  pneumothorax. IMPRESSION: 1. Interim removal of left IJ line.  NG tube coiled in stomach. 2.  Persistent bibasilar atelectasis. Electronically Signed   By: Maisie Fus  Register   On: 05/06/2015 07:30   Dg Abd Portable 1v  05/05/2015  CLINICAL DATA:  Check nasogastric catheter placement EXAM: PORTABLE ABDOMEN - 1 VIEW COMPARISON:  None. FINDINGS: Nasogastric catheter is coiled within the stomach. Multiple dilated loops of small bowel are noted. IMPRESSION: Nasogastric catheter within the stomach. Electronically Signed   By: Alcide Clever M.D.   On: 05/05/2015 16:57    Medications: . antiseptic oral rinse  7 mL Mouth Rinse q12n4p  . aspirin  81 mg Oral Daily  . chlorhexidine  15 mL Mouth Rinse BID  . famotidine (PEPCID) IV  20 mg Intravenous Q24H  . heparin  5,000 Units Subcutaneous 3 times per day  . insulin aspart  0-20 Units Subcutaneous 6 times per day  . insulin glargine  15 Units Subcutaneous BID  . levalbuterol  0.63 mg Nebulization TID  . meropenem (MERREM) IV  500 mg Intravenous Q12H  . sodium chloride  3 mL Intravenous Q12H    Assessment/Plan Acute necrotizing pancreatitis with SIRS+ Gallstones Malnutrition pre albumin 6.9  On 05/06/15  Transaminitis  DM type 2 (diabetes mellitus, type 2) (HCC)  Hyperlipidemia  ARF (acute renal failure)   Acute respiratory failure with hypoxia  Aspiration pneumonia due to vomit   Acute on chronic respiratory failure   Protein-calorie malnutrition, moderate   Umbilical hernia  Chronic kidney disease (CKD), stage III (moderate)  Olivopontocerebellar atrophy  Antibiotics:  Day 9 antibiotics, day 5 of Meropenem DVT:  Heparin/SCD    Plan:  No improvement of her pancreatitis.  WBC going up.   Ongoing malnutrition, Dr. Michaell CowingGross recommending TNA over the weekend.  I will check on repeating CT, the last was on 05/01/15 and without Contrast at that time due to her renal function, which has improved since admit.  Dr. Abbey Chattersosenbower will see ASAP.  LOS:  8 days    Victoria Morrow 05/06/2015

## 2015-05-06 NOTE — Progress Notes (Signed)
PARENTERAL NUTRITION CONSULT NOTE - INITIAL  Pharmacy Consult for TPN Indication: severe necrotizing pancreatitis with ileus  No Known Allergies  Patient Measurements: Height: 5\' 2"  (157.5 cm) Weight: 155 lb 6.8 oz (70.5 kg) IBW/kg (Calculated) : 50.1 Adjusted Body Weight:  Usual Weight:   Vital Signs: Temp: 98.1 F (36.7 C) (01/09 0836) Temp Source: Axillary (01/09 0200) BP: 129/63 mmHg (01/09 0902) Pulse Rate: 112 (01/09 0902) Intake/Output from previous day: 01/08 0701 - 01/09 0700 In: 2798 [I.V.:2073; IV Piggyback:150] Out: 1355 [Urine:805; Stool:550] Intake/Output from this shift:    Labs:  Recent Labs  05/04/15 0337 05/05/15 0344 05/06/15 0325  WBC 14.3* 22.0* 25.6*  HGB 10.3* 9.9* 9.2*  HCT 30.8* 29.8* 27.5*  PLT 292 410* 498*     Recent Labs  05/04/15 0337 05/05/15 0344 05/06/15 0325  NA 145 148* 148*  K 3.2* 3.5 3.5  CL 113* 115* 120*  CO2 20* 19* 18*  GLUCOSE 273* 180* 175*  BUN 37* 31* 29*  CREATININE 2.28* 1.85* 1.79*  CALCIUM 7.6* 8.3* 8.8*  MG 1.9 2.1 2.1  PHOS  --  2.2* 2.8  PROT  --   --  6.0*  ALBUMIN  --   --  2.1*  AST  --   --  32  ALT  --   --  25  ALKPHOS  --   --  92  BILITOT  --   --  0.8  PREALBUMIN  --   --  6.9*  TRIG  --  157* 139   Estimated Creatinine Clearance: 28.5 mL/min (by C-G formula based on Cr of 1.79).    Recent Labs  05/06/15 0031 05/06/15 0429 05/06/15 0728  GLUCAP 158* 129* 130*    Medical History: Past Medical History  Diagnosis Date  . Olivopontocerebellar atrophy (HCC)     Mgmt Dr Vickey Huger  . Diabetes mellitus     Type II. Insulin dependent. Diabetic nephropathy.    Insulin Requirements:  Currently on lantus 10 units BID and resistant SSI q4h using 25 units/24h  Current Nutrition: NPO   IVF: 1/2NS + at 36ml/hr  Central access: PICC per IR TPN start date: 1/9  ASSESSMENT                                                                                                           HPI:  [id number] YOF presents with N/V and abdominal pain on 1/1, found to have gallstone pancreatitis.  CT revealing necrotizing pancreatitis. Surgery note form 1/8 states severe abdominal distention and ileus.  Pharmacy asked to start TPN on 1/7 but PICC has not been able to be placed by IV team.  Plans for IR to place PICC. Of note, she has DM on lantus and metformin prior to admission as well as hyperlipidemia on statin therapy.   Significant events:   Today:    Glucose - in last 24h, CBG x2 above goal of < 150mg /dl  Electrolytes - Na and Cl elevated, HCO3 decreased; Corr Ca at ULN (Ca x Phos < 55), Mg,  phos WNL  Renal - AKI improving, UOP/24h = 0.635ml/kg/hr, + 1.5L/24h  LFTs - WNL  TGs - 139 (1/9)  Prealbumin - 6.9 (1/9)  NUTRITIONAL GOALS                                                                                           RD recs: Kcal: 1800-2000 Protein: 85-95 grams Fluid: 2-2.2 L/day  Clinimix E 5/15 at a goal rate of 75 ml/hr + 20% fat emulsion at 10 ml/hr to provide: 90 g/day protein, 1758 Kcal/day. (could consider increase to 1880ml/hr if tolerates)  PLAN                                                                                                                         At 1800 today:  Start Clinimix E5/15 at 5035ml/hr.  TPN will be hypotonic so should not contribute to hypernatremia  20% fat emulsion at 515ml/hr.  Monitor for re-feeding   Add 15 units of regular insulin to TPN and titrate as appropriate, monitoring for possible changes in lantus dose   Plan to advance as tolerated to the goal rate.  TPN to contain standard multivitamins and trace elements.  Reduce IVF to 40 ml/hr for start of TPN   Continue SSI/Lantus as ordered.   TPN lab panels on Mondays & Thursdays.  F/u daily.  Labs in am  Juliette Alcideustin Zeigler, PharmD, BCPS.   Pager: [phone] 05/06/2015 11:45 AM

## 2015-05-07 LAB — BASIC METABOLIC PANEL
Anion gap: 9 (ref 5–15)
BUN: 28 mg/dL — ABNORMAL HIGH (ref 6–20)
CALCIUM: 9.1 mg/dL (ref 8.9–10.3)
CHLORIDE: 123 mmol/L — AB (ref 101–111)
CO2: 19 mmol/L — AB (ref 22–32)
CREATININE: 1.75 mg/dL — AB (ref 0.44–1.00)
GFR calc Af Amer: 34 mL/min — ABNORMAL LOW (ref >60)
GFR calc non Af Amer: 29 mL/min — ABNORMAL LOW (ref >60)
GLUCOSE: 227 mg/dL — AB (ref 65–99)
Potassium: 3.8 mmol/L (ref 3.5–5.1)
SODIUM: 151 mmol/L — AB (ref 135–145)

## 2015-05-07 LAB — GLUCOSE, CAPILLARY
GLUCOSE-CAPILLARY: 153 mg/dL — AB (ref 65–99)
GLUCOSE-CAPILLARY: 201 mg/dL — AB (ref 65–99)
GLUCOSE-CAPILLARY: 221 mg/dL — AB (ref 65–99)
GLUCOSE-CAPILLARY: 226 mg/dL — AB (ref 65–99)
Glucose-Capillary: 212 mg/dL — ABNORMAL HIGH (ref 65–99)
Glucose-Capillary: 218 mg/dL — ABNORMAL HIGH (ref 65–99)
Glucose-Capillary: 230 mg/dL — ABNORMAL HIGH (ref 65–99)

## 2015-05-07 LAB — CBC
HCT: 25.8 % — ABNORMAL LOW (ref 36.0–46.0)
Hemoglobin: 8.7 g/dL — ABNORMAL LOW (ref 12.0–15.0)
MCH: 28.9 pg (ref 26.0–34.0)
MCHC: 33.7 g/dL (ref 30.0–36.0)
MCV: 85.7 fL (ref 78.0–100.0)
PLATELETS: 572 10*3/uL — AB (ref 150–400)
RBC: 3.01 MIL/uL — ABNORMAL LOW (ref 3.87–5.11)
RDW: 13.1 % (ref 11.5–15.5)
WBC: 26 10*3/uL — ABNORMAL HIGH (ref 4.0–10.5)

## 2015-05-07 LAB — PHOSPHORUS: Phosphorus: 2.6 mg/dL (ref 2.5–4.6)

## 2015-05-07 LAB — CULTURE, BLOOD (ROUTINE X 2): Culture: NO GROWTH

## 2015-05-07 LAB — MAGNESIUM: MAGNESIUM: 2.1 mg/dL (ref 1.7–2.4)

## 2015-05-07 MED ORDER — SODIUM CHLORIDE 0.9 % IJ SOLN
10.0000 mL | Freq: Two times a day (BID) | INTRAMUSCULAR | Status: DC
  Administered 2015-05-07: 20 mL
  Administered 2015-05-08 – 2015-05-15 (×8): 10 mL
  Administered 2015-05-15: 20 mL
  Administered 2015-05-15 – 2015-05-17 (×3): 10 mL
  Administered 2015-05-18: 30 mL
  Administered 2015-05-18 – 2015-05-19 (×2): 10 mL
  Administered 2015-05-19: 20 mL
  Administered 2015-05-20 – 2015-05-23 (×6): 10 mL
  Administered 2015-05-24: 20 mL

## 2015-05-07 MED ORDER — POTASSIUM CHLORIDE IN NACL 20-0.45 MEQ/L-% IV SOLN
INTRAVENOUS | Status: DC
  Filled 2015-05-07: qty 1000

## 2015-05-07 MED ORDER — FAT EMULSION 20 % IV EMUL
120.0000 mL | INTRAVENOUS | Status: AC
  Administered 2015-05-07: 120 mL via INTRAVENOUS
  Filled 2015-05-07: qty 250

## 2015-05-07 MED ORDER — LEVALBUTEROL HCL 0.63 MG/3ML IN NEBU: 0.6300 mg | INHALATION_SOLUTION | Freq: Four times a day (QID) | RESPIRATORY_TRACT | Status: DC | PRN

## 2015-05-07 MED ORDER — SODIUM CHLORIDE 0.9 % IJ SOLN
10.0000 mL | INTRAMUSCULAR | Status: DC | PRN
  Administered 2015-05-19: 20 mL
  Filled 2015-05-07: qty 40

## 2015-05-07 MED ORDER — INSULIN GLARGINE 100 UNIT/ML ~~LOC~~ SOLN: 20.0000 [IU] | Freq: Every day | SUBCUTANEOUS | Status: DC

## 2015-05-07 MED ORDER — TRACE MINERALS CR-CU-MN-SE-ZN 10-1000-500-60 MCG/ML IV SOLN
INTRAVENOUS | Status: AC
  Administered 2015-05-07: 17:00:00 via INTRAVENOUS
  Filled 2015-05-07: qty 1320

## 2015-05-07 NOTE — Progress Notes (Signed)
PULMONARY / CRITICAL CARE MEDICINE   Name: Victoria Morrow MRN: [id number] DOB: 01-23-1951    ADMISSION DATE:  04/28/2015 CONSULTATION DATE: 1/2  REFERRING MD:  Rito EhrlichKrishnan  CHIEF COMPLAINT:  Hypotension and pancreatitis   BRIEF DESCRIPTION:   65 year old female admitted on 1/1 with severe acute pancreatitis believed to be related to gallstones but no CBD or pancreatic duct dilation on admission.  Takes ACE inhibitor baseline.  Had mild gall bladder wall thickening on ultrasound with 5mm stone in neck of gallbladder.  Aspirated on 1/4, requiring intubation, extubated 1/5. Fever 1/5.   SIGNIFICANT EVENTS: 1/4 vomited, aspirated, intubated, shock 1/5 extubated 1/7 SVT requiring cardizem gtt 1/8 Vomiting - NG to LIS  SUBJECTIVE:  denies abd pain  Remains tachy on cardizem gtt 'hungry' Febrile UO picking up   VITAL SIGNS: BP 116/64 mmHg  Pulse 32  Temp(Src) 98.5 F (36.9 C) (Oral)  Resp 30  Ht 5\' 2"  (1.575 m)  Wt 155 lb 6.8 oz (70.5 kg)  BMI 28.42 kg/m2  SpO2 100%  HEMODYNAMICS:    VENTILATOR SETTINGS:    INTAKE / OUTPUT: I/O last 3 completed shifts: In: 3454.3 [I.V.:2649.3; Other:575; IV Piggyback:150] Out: 2600 [Urine:1400; Emesis/NG output:1200]  PHYSICAL EXAMINATION: General:  Awake, alert, conversant , mild resp distress HENT: NCAT, no JVD PULM: CTA B, normal effort CV: RRR, no mgr GI: Belly soft, distended, no tenderness MSK: normal bulk and tone Neuro: awake, alert, no distress  LABS:  BMET  Recent Labs Lab 05/05/15 0344 05/06/15 0325 05/07/15 0400  NA 148* 148* 151*  K 3.5 3.5 3.8  CL 115* 120* 123*  CO2 19* 18* 19*  BUN 31* 29* 28*  CREATININE 1.85* 1.79* 1.75*  GLUCOSE 180* 175* 227*    Electrolytes  Recent Labs Lab 05/05/15 0344 05/06/15 0325 05/07/15 0400  CALCIUM 8.3* 8.8* 9.1  MG 2.1 2.1 2.1  PHOS 2.2* 2.8 2.6    CBC  Recent Labs Lab 05/05/15 0344 05/06/15 0325 05/07/15 0400  WBC 22.0* 25.6* 26.0*  HGB 9.9* 9.2*  8.7*  HCT 29.8* 27.5* 25.8*  PLT 410* 498* 572*    Coag's No results for input(s): APTT, INR in the last 168 hours.  Sepsis Markers No results for input(s): LATICACIDVEN, PROCALCITON, O2SATVEN in the last 168 hours.  ABG  Recent Labs Lab 05/01/15 1547 05/01/15 2100 05/02/15 0435  PHART 7.431 7.429 7.448  PCO2ART 20.9* 27.6* 27.8*  PO2ART 203* 123* 79.9*    Liver Enzymes  Recent Labs Lab 05/01/15 0340 05/06/15 0325  AST 44* 32  ALT 28 25  ALKPHOS 55 92  BILITOT 1.0 0.8  ALBUMIN 2.3* 2.1*    Cardiac Enzymes No results for input(s): TROPONINI, PROBNP in the last 168 hours.  Glucose  Recent Labs Lab 05/06/15 0728 05/06/15 1627 05/06/15 2031 05/07/15 0041 05/07/15 0346 05/07/15 0817  GLUCAP 130* 165* 208* 226* 153* 201*    Imaging  STUDIES:  CT abd 1/1 1. Upper abdominal edema, eccentric right. Centered about the pancreatic head, uncinate process, and neck. Favor pancreatitis. Correlate with pancreatic enzymes (pending). If these are abnormal, less likely differential consideration would include peptic ulcer Disease. 2. Cholelithiasis without specific evidence of cholecystitis or biliary duct dilatation. 3. Trace perihepatic ascites, likely secondary. 4. Small hiatal hernia. 5. Pelvic floor laxity. 6. Renal atrophy. US abd 1/1: 1. Gallbladder wall thickening, with mild pericholecystic fluid.This is nonspecific in the presence of the patient's known pancreatitis. Stones noted within the gallbladder, with a 5 mm stone lodged at the  neck of the gallbladder. Mild acute cholecystitis cannot be entirely excluded, but is considered less likely. 2. No evidence for bile duct obstruction. The patient's apparent duodenal diverticulum is not well characterized on ultrasound. CT abdomen/pelvis 1/5 > necrotic pancreas most likely, limited by lack of contrast, no cholecystitis, small stones, prox small bowel dilation, likely ileus, increased bilateral atelectasis/effusions,  some peritoneal fluid  1/9  CXR images personally reviewed> low lung volumes, bibasal atelectasis, no significant airspace disease  CULTURES: BCX2 1/1>>> neg UC 1/1>>> Klebsiella, amp resistant BC x2 1/5 > ng Resp cx 1/5 >  C diff 1/6 > neg  ANTIBIOTICS: Imipenem 1/1>>> 1/3 Ceftriaxone 1/3 > 1/4 Flagyl 1/3 > 1/5 Meropenem 1/4 >  Oral Vanc 1/6> 1/8    LINES/TUBES: 1/4 ETT > 1/5 1/4 L IJ CVL >   DISCUSSION: 65 year old female admitted severe acute pancreatitis, presumably gallstone related but no obstructing stone seen on imaging aside from one in neck of gallbladder.  Aspirated on 1/4, requiring intubation and central line placement.  CT abdomen on 1/4 shows likely pancreatic necrosis. Extubated 1/5, diarrhea overnight.  ASSESSMENT / PLAN:  PULMONARY A: Acute respiratory failure with hypoxemia > resolved P:   Incentive spirometry Flutter prn Prn xopenex  CARDIOVASCULAR A:  Shock> Still on neo but blood MAP > 70 Transient Afib 1/6, 1/7 - now in s tach P:  Ct cardizem gtt Off Neo   RENAL A:   AKI, UO  improving Hyperchloremia  Hypernatremia  Hypocalcemia Hypokalemia P:   Ct 1/2 NS with KCL Replete lytes I&O strict   GASTROINTESTINAL A:   Acute pancreatitis--> likely necrosis ? etio  Gall bladder wall thickening, GB stone but neg Murphy's sign 1/3 Ileus> improved Diarrhea  P:   npo, aspiration precautions Ct TNA  Prn zofran Cholecystectomy eventually per gen surg   Consider rpt CT since rising WBC   HEMATOLOGIC A:   Anemia Leucocytosis P:  Trend CBC Transfuse per ICU protocol  [place] heparin   INFECTIOUS A:   SIRS due to pancreatitis UTI> Klebiella WBC up 1/6, diarrhea, multiple antibiotics, at risk for C diff P:   Continue meropenem x 10-14 ds total -1/11?   ENDOCRINE A:   Hyperglycemia P:   ICU glycemic control protocol   NEUROLOGIC A:   Pain control Extreme deconditioning P:   Fentanyl prn PT consult   FAMILY   - Updates: husband updated 1/8 AM bedside  - Inter-disciplinary family meet or Palliative Care meeting due by:  NA  Summary - Etiology of acute pancreatitis not clear, recovering organ failure, but rising WBCs, on TNA Attempt to mobilise   The patient is critically ill with multiple organ systems failure and requires high complexity decision making for assessment and support, frequent evaluation and titration of therapies, application of advanced monitoring technologies and extensive interpretation of multiple databases. Critical Care Time devoted to patient care services described in this note independent of APP time is 31 minutes.   Cyril Mourning MD. Tonny Bollman. [facility] Pager [phone] If no response call [phone]    05/07/2015, 10:25 AM

## 2015-05-07 NOTE — Progress Notes (Addendum)
PARENTERAL NUTRITION CONSULT NOTE - INITIAL  Pharmacy Consult for TPN Indication: severe necrotizing pancreatitis with ileus  No Known Allergies  Patient Measurements: Height: 5\' 2"  (157.5 cm) Weight: 155 lb 6.8 oz (70.5 kg) IBW/kg (Calculated) : 50.1   Vital Signs: Temp: 100.3 F (37.9 C) (01/10 0400) Temp Source: Axillary (01/10 0400) BP: 131/74 mmHg (01/10 0600) Pulse Rate: 127 (01/10 0100) Intake/Output from previous day: 01/09 0701 - 01/10 0700 In: 1836.3 [I.V.:1656.3; IV Piggyback:100; TPN:80] Out: 1850 [Urine:1100; Emesis/NG output:750]    Labs:  Recent Labs  05/05/15 0344 05/06/15 0325 05/07/15 0400  WBC 22.0* 25.6* 26.0*  HGB 9.9* 9.2* 8.7*  HCT 29.8* 27.5* 25.8*  PLT 410* 498* 572*     Recent Labs  05/05/15 0344 05/06/15 0325 05/07/15 0400  NA 148* 148* 151*  K 3.5 3.5 3.8  CL 115* 120* 123*  CO2 19* 18* 19*  GLUCOSE 180* 175* 227*  BUN 31* 29* 28*  CREATININE 1.85* 1.79* 1.75*  CALCIUM 8.3* 8.8* 9.1  MG 2.1 2.1 2.1  PHOS 2.2* 2.8 2.6  PROT  --  6.0*  --   ALBUMIN  --  2.1*  --   AST  --  32  --   ALT  --  25  --   ALKPHOS  --  92  --   BILITOT  --  0.8  --   PREALBUMIN  --  6.9*  --   TRIG 157* 139  --    Estimated Creatinine Clearance: 29.1 mL/min (by C-G formula based on Cr of 1.75).    Recent Labs  05/06/15 2031 05/07/15 0041 05/07/15 0346  GLUCAP 208* 226* 153*    Medical History: Past Medical History  Diagnosis Date  . Olivopontocerebellar atrophy (HCC)     Mgmt Dr Vickey Hugerohmeier  . Diabetes mellitus     Type II. Insulin dependent. Diabetic nephropathy.    Insulin Requirements:  Currently on lantus 10 units BID,  resistant SSI q4h using 32 units/24h, and 15 units of regular insulin added to TPN  Current Nutrition: NPO   IVF: 1/2NS + KCl 20meq at 7640ml/hr  Central access: PICC per IR TPN start date: 1/9  ASSESSMENT                                                                                                           HPI:  [id number] YOF presents with N/V and abdominal pain on 1/1, found to have gallstone pancreatitis.  CT revealing necrotizing pancreatitis. Surgery note form 1/8 states severe abdominal distention and ileus.  Pharmacy asked to start TPN on 1/7 but PICC has not been able to be placed by IV team.  Plans for IR to place PICC. Of note, she has DM on lantus and metformin prior to admission as well as hyperlipidemia on statin therapy.   Significant events:   Today:    Glucose - since TPN started, CBG x4 above goal of < 150mg /dl  Electrolytes - Na and Cl elevated, HCO3 decreased; Corr Ca increased to 10.6 (Ca x Phos < 55),  Mg, phos WNL  Renal - AKI improving, UOP/24h = 0.47ml/kg/hr, I/O even, NG tube  LFTs - WNL (1/9)  TGs - 139 (1/9)  Prealbumin - 6.9 (1/9)  NUTRITIONAL GOALS                                                                                           RD recs: Kcal: 1800-2000 Protein: 85-95 grams Fluid: 2-2.2 L/day  Clinimix E 5/15 at a goal rate of 75 ml/hr + 20% fat emulsion at 10 ml/hr to provide: 90 g/day protein, 1758 Kcal/day. (could consider increase to 47ml/hr if tolerates)  PLAN                                                                                                                         At 1800 today:  Increase Clinimix E5/15 to 61ml/hr (remains below goal)  TPN will be hypotonic so should have little to no contribution to hypernatremia  20% fat emulsion at 28ml/hr.  Currently no signs of refeeding syndrome  Increase to 40 units of regular insulin in TPN (based on SSI use and increase in -CHO with new TPN bag) and titrate as appropriate, monitoring for possible changes in Lantus dose   Plan to advance as tolerated to the goal rate - CBGs may prevent from rapid advancement of TPN  TPN to contain standard multivitamins and trace elements.  Reduce IVF to 91ml/hr for increase in TPN rate  Continue SSI/Lantus as ordered.   TPN lab panels on  Mondays & Thursdays.  F/u daily.  Labs in am  Agree with student's assessment and plan as above.   Juliette Alcide, PharmD, BCPS.   Pager: [phone] 05/07/2015 8:04 AM

## 2015-05-07 NOTE — Progress Notes (Signed)
Patient ID: Victoria Morrow, female   DOB: 1951-01-11, 65 y.o.   MRN: [id number]     [facility] [address] [place], [place] [address]    Phone: [phone] FAX: [phone]     Subjective: Denies pain, although taking in pain meds. WBC up 25.6--->26k.  t maxc 102.3.   BP stable.  Off pressor. On dilt gtt.   Objective:  Vital signs:  Filed Vitals:   05/07/15 0500 05/07/15 0600 05/07/15 0800 05/07/15 0851  BP: 131/63 131/74 116/64   Pulse:   32   Temp:   98.5 F (36.9 C)   TempSrc:   Oral   Resp: 37 30 30   Height:      Weight:      SpO2:   100% 100%    Last BM Date: 05/06/15  Intake/Output   Yesterday:  01/09 0701 - 01/10 0700 In: 1836.3 [I.V.:1656.3; IV Piggyback:100; TPN:80] Out: 8412 [Urine:1100; Emesis/NG output:750] This shift:   Physical Exam: General: Pt awake/alert/oriented x4 in no acute distress  Abdomen: +BS, abdomen is soft, TTP RUQ/epigastrium without guarding   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Walker)    Results:   Labs: Results for orders placed or performed during the hospital encounter of 04/28/15 (from the past 48 hour(s))  Glucose, capillary     Status: Abnormal   Collection Time: 05/05/15 12:10 PM  Result Value Ref Range   Glucose-Capillary 248 (H) 65 - 99 mg/dL   Comment 1 Notify RN    Comment 2 Document in Chart   Glucose, capillary     Status: Abnormal   Collection Time: 05/05/15  4:48 PM  Result  Value Ref Range   Glucose-Capillary 176 (H) 65 - 99 mg/dL   Comment 1 Notify RN    Comment 2 Document in Chart   Glucose, capillary     Status: Abnormal   Collection Time: 05/05/15  8:10 PM  Result Value Ref Range   Glucose-Capillary 131 (H) 65 - 99 mg/dL  Glucose, capillary     Status: Abnormal   Collection Time: 05/06/15 12:31 AM  Result Value Ref Range   Glucose-Capillary 158 (H) 65 - 99 mg/dL  Comprehensive metabolic panel     Status: Abnormal   Collection Time: 05/06/15  3:25 AM  Result Value Ref Range   Sodium 148 (H) 135 - 145 mmol/L   Potassium 3.5 3.5 - 5.1 mmol/L   Chloride 120 (H) 101 - 111 mmol/L   CO2 18 (L) 22 - 32 mmol/L   Glucose, Bld 175 (H) 65 - 99 mg/dL   BUN 29 (H) 6 - 20 mg/dL   Creatinine, Ser 1.79 (H) 0.44 - 1.00 mg/dL   Calcium 8.8 (L) 8.9 - 10.3 mg/dL   Total Protein 6.0 (L) 6.5 - 8.1 g/dL   Albumin 2.1 (L) 3.5 - 5.0 g/dL   AST 32 15 - 41 U/L   ALT 25 14 - 54 U/L  Alkaline Phosphatase 92 38 - 126 U/L   Total Bilirubin 0.8 0.3 - 1.2 mg/dL   GFR calc non Af Amer 28 (L) >60 mL/min   GFR calc Af Amer 33 (L) >60 mL/min    Comment: (NOTE) The eGFR has been calculated using the CKD EPI equation. This calculation has not been validated in all clinical situations. eGFR's persistently <60 mL/min signify possible Chronic Kidney Disease.    Anion gap 10 5 - 15  Magnesium     Status: None   Collection Time: 05/06/15  3:25 AM  Result Value Ref Range   Magnesium 2.1 1.7 - 2.4 mg/dL  Phosphorus     Status: None   Collection Time: 05/06/15  3:25 AM  Result Value Ref Range   Phosphorus 2.8 2.5 - 4.6 mg/dL  CBC     Status: Abnormal   Collection Time: 05/06/15  3:25 AM  Result Value Ref Range   WBC 25.6 (H) 4.0 - 10.5 K/uL    Comment: WHITE COUNT CONFIRMED ON SMEAR   RBC 3.24 (L) 3.87 - 5.11 MIL/uL   Hemoglobin 9.2 (L) 12.0 - 15.0 g/dL   HCT 27.5 (L) 36.0 - 46.0 %   MCV 84.9 78.0 - 100.0 fL   MCH 28.4 26.0 - 34.0 pg   MCHC 33.5 30.0 - 36.0 g/dL   RDW  13.2 11.5 - 15.5 %   Platelets 498 (H) 150 - 400 K/uL  Differential     Status: Abnormal   Collection Time: 05/06/15  3:25 AM  Result Value Ref Range   Neutrophils Relative % 89 %   Lymphocytes Relative 6 %   Monocytes Relative 5 %   Eosinophils Relative 0 %   Basophils Relative 0 %   Neutro Abs 22.8 (H) 1.7 - 7.7 K/uL   Lymphs Abs 1.5 0.7 - 4.0 K/uL   Monocytes Absolute 1.3 (H) 0.1 - 1.0 K/uL   Eosinophils Absolute 0.0 0.0 - 0.7 K/uL   Basophils Absolute 0.0 0.0 - 0.1 K/uL   RBC Morphology POLYCHROMASIA PRESENT     Comment: RARE NRBCs   Smear Review LARGE PLATELETS PRESENT   Triglycerides     Status: None   Collection Time: 05/06/15  3:25 AM  Result Value Ref Range   Triglycerides 139 <150 mg/dL    Comment: Performed at [facility]  Prealbumin     Status: Abnormal   Collection Time: 05/06/15  3:25 AM  Result Value Ref Range   Prealbumin 6.9 (L) 18 - 38 mg/dL    Comment: Performed at [facility]  Glucose, capillary     Status: Abnormal   Collection Time: 05/06/15  4:29 AM  Result Value Ref Range   Glucose-Capillary 129 (H) 65 - 99 mg/dL  Glucose, capillary     Status: Abnormal   Collection Time: 05/06/15  7:28 AM  Result Value Ref Range   Glucose-Capillary 130 (H) 65 - 99 mg/dL  Glucose, capillary     Status: Abnormal   Collection Time: 05/06/15  4:27 PM  Result Value Ref Range   Glucose-Capillary 165 (H) 65 - 99 mg/dL  Glucose, capillary     Status: Abnormal   Collection Time: 05/06/15  8:31 PM  Result Value Ref Range   Glucose-Capillary 208 (H) 65 - 99 mg/dL   Comment 1 Notify RN    Comment 2 Document in Chart   Glucose, capillary     Status: Abnormal   Collection Time: 05/07/15 12:41 AM  Result Value Ref  Range   Glucose-Capillary 226 (H) 65 - 99 mg/dL  Glucose, capillary     Status: Abnormal   Collection Time: 05/07/15  3:46 AM  Result Value Ref Range   Glucose-Capillary 153 (H) 65 - 99 mg/dL  Basic metabolic panel     Status: Abnormal    Collection Time: 05/07/15  4:00 AM  Result Value Ref Range   Sodium 151 (H) 135 - 145 mmol/L   Potassium 3.8 3.5 - 5.1 mmol/L   Chloride 123 (H) 101 - 111 mmol/L   CO2 19 (L) 22 - 32 mmol/L   Glucose, Bld 227 (H) 65 - 99 mg/dL   BUN 28 (H) 6 - 20 mg/dL   Creatinine, Ser 1.75 (H) 0.44 - 1.00 mg/dL   Calcium 9.1 8.9 - 10.3 mg/dL   GFR calc non Af Amer 29 (L) >60 mL/min   GFR calc Af Amer 34 (L) >60 mL/min    Comment: (NOTE) The eGFR has been calculated using the CKD EPI equation. This calculation has not been validated in all clinical situations. eGFR's persistently <60 mL/min signify possible Chronic Kidney Disease.    Anion gap 9 5 - 15  CBC     Status: Abnormal   Collection Time: 05/07/15  4:00 AM  Result Value Ref Range   WBC 26.0 (H) 4.0 - 10.5 K/uL   RBC 3.01 (L) 3.87 - 5.11 MIL/uL   Hemoglobin 8.7 (L) 12.0 - 15.0 g/dL   HCT 25.8 (L) 36.0 - 46.0 %   MCV 85.7 78.0 - 100.0 fL   MCH 28.9 26.0 - 34.0 pg   MCHC 33.7 30.0 - 36.0 g/dL   RDW 13.1 11.5 - 15.5 %   Platelets 572 (H) 150 - 400 K/uL  Magnesium     Status: None   Collection Time: 05/07/15  4:00 AM  Result Value Ref Range   Magnesium 2.1 1.7 - 2.4 mg/dL  Phosphorus     Status: None   Collection Time: 05/07/15  4:00 AM  Result Value Ref Range   Phosphorus 2.6 2.5 - 4.6 mg/dL  Glucose, capillary     Status: Abnormal   Collection Time: 05/07/15  8:17 AM  Result Value Ref Range   Glucose-Capillary 201 (H) 65 - 99 mg/dL   Comment 1 Notify RN    Comment 2 Document in Chart     Imaging / Studies: Ir Fluoro Guide Cv Line Right  05/06/2015  CLINICAL DATA:  Aspiration pneumonia EXAM: RIGHT UPPER EX PICC LINE PLACEMENT WITH ULTRASOUND AND FLUOROSCOPIC GUIDANCE FLUOROSCOPY TIME:  18 seconds PROCEDURE: The patient was advised of the possible risks and complications and agreed to undergo the procedure. The patient was then brought to the angiographic suite for the procedure. The right arm was prepped with chlorhexidine, draped  in the usual sterile fashion using maximum barrier technique (cap and mask, sterile gown, sterile gloves, large sterile sheet, hand hygiene and cutaneous antisepsis) and infiltrated locally with 1% Lidocaine. Ultrasound demonstrated patency of the right basilic vein, and this was documented with an image. Under real-time ultrasound guidance, this vein was accessed with a 21 gauge micropuncture needle and image documentation was performed. A 0.018 wire was introduced in to the vein. Over this, a 5 [place] double lumen power PICC was advanced to the lower SVC/right atrial junction. Fluoroscopy during the procedure and fluoro spot radiograph confirms appropriate catheter position. The catheter was flushed and covered with a sterile dressing. COMPLICATIONS: None LENGTH: 38 cm IMPRESSION: Successful right arm power  PICC line placement with ultrasound and fluoroscopic guidance. The catheter is ready for use. Electronically Signed   By: Marybelle Killings M.D.   On: 05/06/2015 12:57   Ir US Guide Vasc Access Right  05/06/2015  CLINICAL DATA:  Aspiration pneumonia EXAM: RIGHT UPPER EX PICC LINE PLACEMENT WITH ULTRASOUND AND FLUOROSCOPIC GUIDANCE FLUOROSCOPY TIME:  18 seconds PROCEDURE: The patient was advised of the possible risks and complications and agreed to undergo the procedure. The patient was then brought to the angiographic suite for the procedure. The right arm was prepped with chlorhexidine, draped in the usual sterile fashion using maximum barrier technique (cap and mask, sterile gown, sterile gloves, large sterile sheet, hand hygiene and cutaneous antisepsis) and infiltrated locally with 1% Lidocaine. Ultrasound demonstrated patency of the right basilic vein, and this was documented with an image. Under real-time ultrasound guidance, this vein was accessed with a 21 gauge micropuncture needle and image documentation was performed. A 0.018 wire was introduced in to the vein. Over this, a 5 [place] double lumen power  PICC was advanced to the lower SVC/right atrial junction. Fluoroscopy during the procedure and fluoro spot radiograph confirms appropriate catheter position. The catheter was flushed and covered with a sterile dressing. COMPLICATIONS: None LENGTH: 38 cm IMPRESSION: Successful right arm power PICC line placement with ultrasound and fluoroscopic guidance. The catheter is ready for use. Electronically Signed   By: Marybelle Killings M.D.   On: 05/06/2015 12:57   Dg Chest Port 1 View  05/06/2015  CLINICAL DATA:  Acute respiratory failure. EXAM: PORTABLE CHEST 1 VIEW COMPARISON:  05/03/2015. FINDINGS: Interim removal of left IJ line . NG tube noted coiled in the stomach. Mediastinum hilar structures normal. Persistent bibasilar atelectasis. No pleural effusion or pneumothorax. IMPRESSION: 1. Interim removal of left IJ line.  NG tube coiled in stomach. 2.  Persistent bibasilar atelectasis. Electronically Signed   By: Marcello Moores  Register   On: 05/06/2015 07:30   Dg Abd Portable 1v  05/05/2015  CLINICAL DATA:  Check nasogastric catheter placement EXAM: PORTABLE ABDOMEN - 1 VIEW COMPARISON:  None. FINDINGS: Nasogastric catheter is coiled within the stomach. Multiple dilated loops of small bowel are noted. IMPRESSION: Nasogastric catheter within the stomach. Electronically Signed   By: Inez Catalina M.D.   On: 05/05/2015 16:57    Medications / Allergies:  Scheduled Meds: . antiseptic oral rinse  7 mL Mouth Rinse q12n4p  . aspirin  81 mg Oral Daily  . chlorhexidine  15 mL Mouth Rinse BID  . famotidine (PEPCID) IV  20 mg Intravenous Q24H  . heparin  5,000 Units Subcutaneous 3 times per day  . insulin aspart  0-20 Units Subcutaneous 6 times per day  . insulin glargine  10 Units Subcutaneous BID  . levalbuterol  0.63 mg Nebulization TID  . meropenem (MERREM) IV  500 mg Intravenous Q12H  . sodium chloride  3 mL Intravenous Q12H   Continuous Infusions: . 0.45 % NaCl with KCl 20 mEq / L 40 mL/hr at 05/06/15 1713  .  diltiazem (CARDIZEM) infusion 15 mg/hr (05/06/15 1315)  . Marland KitchenTPN (CLINIMIX-E) Adult 35 mL/hr at 05/06/15 1704   And  . fat emulsion 120 mL (05/06/15 1704)   PRN Meds:.acetaminophen **OR** acetaminophen, albuterol, bismuth subsalicylate, diphenhydrAMINE, fentaNYL (SUBLIMAZE) injection, hydrALAZINE, ondansetron (ZOFRAN) IV **OR** ondansetron (ZOFRAN) IV, promethazine  Antibiotics: Anti-infectives    Start     Dose/Rate Route Frequency Ordered Stop   05/03/15 1200  vancomycin (VANCOCIN) 50 mg/mL oral solution 500 mg  Status:  Discontinued     500 mg Oral 4 times per day 05/03/15 0917 05/04/15 1306   05/02/15 1200  cefTRIAXone (ROCEPHIN) 1 g in dextrose 5 % 50 mL IVPB  Status:  Discontinued     1 g 100 mL/hr over 30 Minutes Intravenous Every 24 hours 05/01/15 1448 05/01/15 2004   05/02/15 0600  imipenem-cilastatin (PRIMAXIN) 250 mg in sodium chloride 0.9 % 100 mL IVPB  Status:  Discontinued     250 mg 200 mL/hr over 30 Minutes Intravenous 3 times per day 05/01/15 2025 05/02/15 0027   05/02/15 0030  meropenem (MERREM) 500 mg in sodium chloride 0.9 % 50 mL IVPB     500 mg 100 mL/hr over 30 Minutes Intravenous Every 12 hours 05/02/15 0027     05/01/15 2030  imipenem-cilastatin (PRIMAXIN) 250 mg in sodium chloride 0.9 % 100 mL IVPB     250 mg 200 mL/hr over 30 Minutes Intravenous NOW 05/01/15 2025 05/01/15 2110   04/30/15 1100  metroNIDAZOLE (FLAGYL) IVPB 500 mg  Status:  Discontinued     500 mg 100 mL/hr over 60 Minutes Intravenous Every 12 hours 04/30/15 0951 05/02/15 0751   04/30/15 1000  cefTRIAXone (ROCEPHIN) 1 g in dextrose 5 % 50 mL IVPB  Status:  Discontinued     1 g 100 mL/hr over 30 Minutes Intravenous Every 24 hours 04/30/15 0939 05/01/15 1205   04/30/15 0945  cefTRIAXone (ROCEPHIN) injection 1 g  Status:  Discontinued     1 g Intramuscular Every 24 hours 04/30/15 0930 04/30/15 0938   04/29/15 0200  imipenem-cilastatin (PRIMAXIN) 250 mg in sodium chloride 0.9 % 100 mL IVPB  Status:   Discontinued     250 mg 200 mL/hr over 30 Minutes Intravenous Every 8 hours 04/28/15 1812 04/30/15 0930   04/28/15 1830  imipenem-cilastatin (PRIMAXIN) 250 mg in sodium chloride 0.9 % 100 mL IVPB     250 mg 200 mL/hr over 30 Minutes Intravenous  Once 04/28/15 1812 04/28/15 1904        Assessment/Plan Acute necrotizing pancreatitis with SIRS Gallstones -CT in AM, keep NPO, on antibiotics.  PCM-TPN started  FEN-NPO VTE prophylaxis-SCD/heparin ID- Meropenem D#6 for sirs, uti  Erby Pian, ANP-BC [place] [facility] Pager [phone]([id number])   05/07/2015 8:57 AM

## 2015-05-08 LAB — CBC
HEMATOCRIT: 24.2 % — AB (ref 36.0–46.0)
Hemoglobin: 7.9 g/dL — ABNORMAL LOW (ref 12.0–15.0)
MCH: 27.8 pg (ref 26.0–34.0)
MCHC: 32.6 g/dL (ref 30.0–36.0)
MCV: 85.2 fL (ref 78.0–100.0)
Platelets: 545 10*3/uL — ABNORMAL HIGH (ref 150–400)
RBC: 2.84 MIL/uL — ABNORMAL LOW (ref 3.87–5.11)
RDW: 13.1 % (ref 11.5–15.5)
WBC: 25 10*3/uL — ABNORMAL HIGH (ref 4.0–10.5)

## 2015-05-08 LAB — MAGNESIUM: Magnesium: 1.9 mg/dL (ref 1.7–2.4)

## 2015-05-08 LAB — BASIC METABOLIC PANEL
Anion gap: 9 (ref 5–15)
BUN: 29 mg/dL — AB (ref 6–20)
CALCIUM: 9.2 mg/dL (ref 8.9–10.3)
CHLORIDE: 124 mmol/L — AB (ref 101–111)
CO2: 19 mmol/L — AB (ref 22–32)
CREATININE: 1.42 mg/dL — AB (ref 0.44–1.00)
GFR calc non Af Amer: 38 mL/min — ABNORMAL LOW (ref >60)
GFR, EST AFRICAN AMERICAN: 44 mL/min — AB (ref >60)
GLUCOSE: 278 mg/dL — AB (ref 65–99)
Potassium: 4.2 mmol/L (ref 3.5–5.1)
Sodium: 152 mmol/L — ABNORMAL HIGH (ref 135–145)

## 2015-05-08 LAB — GLUCOSE, CAPILLARY
GLUCOSE-CAPILLARY: 245 mg/dL — AB (ref 65–99)
GLUCOSE-CAPILLARY: 341 mg/dL — AB (ref 65–99)
GLUCOSE-CAPILLARY: 319 mg/dL — AB (ref 65–99)
GLUCOSE-CAPILLARY: 340 mg/dL — AB (ref 65–99)
Glucose-Capillary: 197 mg/dL — ABNORMAL HIGH (ref 65–99)

## 2015-05-08 LAB — PHOSPHORUS: Phosphorus: 3.1 mg/dL (ref 2.5–4.6)

## 2015-05-08 MED ORDER — FAT EMULSION 20 % IV EMUL
120.0000 mL | INTRAVENOUS | Status: AC
  Administered 2015-05-08: 120 mL via INTRAVENOUS
  Filled 2015-05-08: qty 200

## 2015-05-08 MED ORDER — TRACE MINERALS CR-CU-MN-SE-ZN 10-1000-500-60 MCG/ML IV SOLN
INTRAVENOUS | Status: AC
  Administered 2015-05-08: 18:00:00 via INTRAVENOUS
  Filled 2015-05-08: qty 1320

## 2015-05-08 MED ORDER — TRACE MINERALS CR-CU-MN-SE-ZN 10-1000-500-60 MCG/ML IV SOLN
INTRAVENOUS | Status: DC
  Filled 2015-05-08: qty 1320

## 2015-05-08 MED ORDER — INSULIN GLARGINE 100 UNIT/ML ~~LOC~~ SOLN
20.0000 [IU] | Freq: Every day | SUBCUTANEOUS | Status: DC
  Filled 2015-05-08: qty 0.2

## 2015-05-08 MED ORDER — SODIUM CHLORIDE 0.9 % IV SOLN
1.0000 g | Freq: Two times a day (BID) | INTRAVENOUS | Status: DC
  Administered 2015-05-08 – 2015-05-17 (×18): 1 g via INTRAVENOUS
  Filled 2015-05-08 (×20): qty 1

## 2015-05-08 MED ORDER — FAT EMULSION 20 % IV EMUL
120.0000 mL | INTRAVENOUS | Status: DC
  Filled 2015-05-08: qty 250

## 2015-05-08 MED ORDER — INSULIN GLARGINE 100 UNIT/ML ~~LOC~~ SOLN
25.0000 [IU] | Freq: Every day | SUBCUTANEOUS | Status: DC
Start: 2015-05-08 — End: 2015-05-09
  Administered 2015-05-08: 25 [IU] via SUBCUTANEOUS
  Filled 2015-05-08 (×2): qty 0.25

## 2015-05-08 MED ORDER — KCL IN DEXTROSE-NACL 20-5-0.45 MEQ/L-%-% IV SOLN
INTRAVENOUS | Status: DC
  Administered 2015-05-08: 11:00:00 via INTRAVENOUS
  Filled 2015-05-08: qty 1000

## 2015-05-08 NOTE — Progress Notes (Signed)
Patient ID: Victoria Morrow, female   DOB: Jul 15, 1950, 65 y.o.   MRN: [id number]     [facility]      [address] [place], [place] [address]    Phone: [phone] FAX: [phone]     Subjective: Denies pain. Having BMs. 2 doses of pain meds given overnight. Temp curve down.  WBC 26k--->25k.  NGT in place 11.1L out.   Objective:  Vital signs:  Filed Vitals:   05/08/15 0500 05/08/15 0600 05/08/15 0700 05/08/15 0711  BP: 121/58 123/61 137/57   Pulse: 106 109 110   Temp:    99 F (37.2 C)  TempSrc:    Oral  Resp: 26 32 33   Height:      Weight:      SpO2: 98% 98% 98%     Last BM Date: 05/06/15  Intake/Output   Yesterday:  01/10 0701 - 01/11 0700 In: 2685.8 [P.O.:30; I.V.:638.7; NG/GT:10; IV Piggyback:200; TPN:1807.1] Out: 2600 [Urine:1400; Emesis/NG output:1150; Stool:50] This shift:    I/O last 3 completed shifts: In: 3378.8 [P.O.:30; I.V.:1231.7; NG/GT:10; IV Piggyback:300] Out: 3600 [Urine:1950; Emesis/NG output:1600; Stool:50]   Physical Exam: General: Pt awake/alert/oriented x4 in no acute distress  Abdomen: +BS, abdomen is soft, non tender.    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[place])    Results:   Labs: Results for orders placed or performed during the hospital encounter of 04/28/15 (from the past 48 hour(s))  Glucose, capillary     Status: Abnormal   Collection Time: 05/06/15  4:27 PM  Result Value Ref Range   Glucose-Capillary  165 (H) 65 - 99 mg/dL  Glucose, capillary     Status: Abnormal   Collection Time: 05/06/15  8:31 PM  Result Value Ref Range   Glucose-Capillary 208 (H) 65 - 99 mg/dL   Comment 1 Notify RN    Comment 2 Document in Chart   Glucose, capillary     Status: Abnormal   Collection Time: 05/07/15 12:41 AM  Result Value Ref Range   Glucose-Capillary 226 (H) 65 - 99 mg/dL  Glucose, capillary     Status: Abnormal   Collection Time: 05/07/15  3:46 AM  Result Value Ref Range   Glucose-Capillary 153 (H) 65 - 99 mg/dL  Basic metabolic panel     Status: Abnormal   Collection Time: 05/07/15  4:00 AM  Result Value Ref Range   Sodium 151 (H) 135 - 145 mmol/L   Potassium 3.8 3.5 - 5.1 mmol/L   Chloride 123 (H) 101 - 111 mmol/L   CO2 19 (L) 22 - 32 mmol/L   Glucose, Bld 227 (H) 65 - 99 mg/dL   BUN 28 (H) 6 - 20 mg/dL   Creatinine, Ser 1.75 (H) 0.44 - 1.00 mg/dL   Calcium 9.1 8.9 - 10.3 mg/dL   GFR calc non Af Amer 29 (L) >60 mL/min   GFR calc Af Amer 34 (L) >60 mL/min    Comment: (NOTE) The eGFR has been  calculated using the CKD EPI equation. This calculation has not been validated in all clinical situations. eGFR's persistently <60 mL/min signify possible Chronic Kidney Disease.    Anion gap 9 5 - 15  CBC     Status: Abnormal   Collection Time: 05/07/15  4:00 AM  Result Value Ref Range   WBC 26.0 (H) 4.0 - 10.5 K/uL   RBC 3.01 (L) 3.87 - 5.11 MIL/uL   Hemoglobin 8.7 (L) 12.0 - 15.0 g/dL   HCT 25.8 (L) 36.0 - 46.0 %   MCV 85.7 78.0 - 100.0 fL   MCH 28.9 26.0 - 34.0 pg   MCHC 33.7 30.0 - 36.0 g/dL   RDW 13.1 11.5 - 15.5 %   Platelets 572 (H) 150 - 400 K/uL  Magnesium     Status: None   Collection Time: 05/07/15  4:00 AM  Result Value Ref Range   Magnesium 2.1 1.7 - 2.4 mg/dL  Phosphorus     Status: None   Collection Time: 05/07/15  4:00 AM  Result Value Ref Range   Phosphorus 2.6 2.5 - 4.6 mg/dL  Glucose, capillary     Status: Abnormal   Collection Time: 05/07/15  8:17 AM  Result  Value Ref Range   Glucose-Capillary 201 (H) 65 - 99 mg/dL   Comment 1 Notify RN    Comment 2 Document in Chart   Glucose, capillary     Status: Abnormal   Collection Time: 05/07/15 12:49 PM  Result Value Ref Range   Glucose-Capillary 230 (H) 65 - 99 mg/dL  Glucose, capillary     Status: Abnormal   Collection Time: 05/07/15  3:53 PM  Result Value Ref Range   Glucose-Capillary 212 (H) 65 - 99 mg/dL  Glucose, capillary     Status: Abnormal   Collection Time: 05/07/15  7:33 PM  Result Value Ref Range   Glucose-Capillary 221 (H) 65 - 99 mg/dL   Comment 1 Notify RN   Glucose, capillary     Status: Abnormal   Collection Time: 05/07/15 11:50 PM  Result Value Ref Range   Glucose-Capillary 218 (H) 65 - 99 mg/dL   Comment 1 Notify RN   Glucose, capillary     Status: Abnormal   Collection Time: 05/08/15  3:37 AM  Result Value Ref Range   Glucose-Capillary 197 (H) 65 - 99 mg/dL   Comment 1 Notify RN   CBC     Status: Abnormal   Collection Time: 05/08/15  4:40 AM  Result Value Ref Range   WBC 25.0 (H) 4.0 - 10.5 K/uL   RBC 2.84 (L) 3.87 - 5.11 MIL/uL   Hemoglobin 7.9 (L) 12.0 - 15.0 g/dL   HCT 24.2 (L) 36.0 - 46.0 %   MCV 85.2 78.0 - 100.0 fL   MCH 27.8 26.0 - 34.0 pg   MCHC 32.6 30.0 - 36.0 g/dL   RDW 13.1 11.5 - 15.5 %   Platelets 545 (H) 150 - 400 K/uL  Basic metabolic panel     Status: Abnormal   Collection Time: 05/08/15  4:40 AM  Result Value Ref Range   Sodium 152 (H) 135 - 145 mmol/L   Potassium 4.2 3.5 - 5.1 mmol/L   Chloride 124 (H) 101 - 111 mmol/L   CO2 19 (L) 22 - 32 mmol/L   Glucose, Bld 278 (H) 65 - 99 mg/dL   BUN 29 (H) 6 - 20 mg/dL   Creatinine, Ser 1.42 (H) 0.44 - 1.00 mg/dL   Calcium 9.2  8.9 - 10.3 mg/dL   GFR calc non Af Amer 38 (L) >60 mL/min   GFR calc Af Amer 44 (L) >60 mL/min    Comment: (NOTE) The eGFR has been calculated using the CKD EPI equation. This calculation has not been validated in all clinical situations. eGFR's persistently <60 mL/min  signify possible Chronic Kidney Disease.    Anion gap 9 5 - 15  Magnesium     Status: None   Collection Time: 05/08/15  4:40 AM  Result Value Ref Range   Magnesium 1.9 1.7 - 2.4 mg/dL  Phosphorus     Status: None   Collection Time: 05/08/15  4:40 AM  Result Value Ref Range   Phosphorus 3.1 2.5 - 4.6 mg/dL  Glucose, capillary     Status: Abnormal   Collection Time: 05/08/15  7:07 AM  Result Value Ref Range   Glucose-Capillary 245 (H) 65 - 99 mg/dL   Comment 1 Notify RN     Imaging / Studies: Ir Fluoro Guide Cv Line Right  05/06/2015  CLINICAL DATA:  Aspiration pneumonia EXAM: RIGHT UPPER EX PICC LINE PLACEMENT WITH ULTRASOUND AND FLUOROSCOPIC GUIDANCE FLUOROSCOPY TIME:  18 seconds PROCEDURE: The patient was advised of the possible risks and complications and agreed to undergo the procedure. The patient was then brought to the angiographic suite for the procedure. The right arm was prepped with chlorhexidine, draped in the usual sterile fashion using maximum barrier technique (cap and mask, sterile gown, sterile gloves, large sterile sheet, hand hygiene and cutaneous antisepsis) and infiltrated locally with 1% Lidocaine. Ultrasound demonstrated patency of the right basilic vein, and this was documented with an image. Under real-time ultrasound guidance, this vein was accessed with a 21 gauge micropuncture needle and image documentation was performed. A 0.018 wire was introduced in to the vein. Over this, a 5 [place] double lumen power PICC was advanced to the lower SVC/right atrial junction. Fluoroscopy during the procedure and fluoro spot radiograph confirms appropriate catheter position. The catheter was flushed and covered with a sterile dressing. COMPLICATIONS: None LENGTH: 38 cm IMPRESSION: Successful right arm power PICC line placement with ultrasound and fluoroscopic guidance. The catheter is ready for use. Electronically Signed   By: Marybelle Killings M.D.   On: 05/06/2015 12:57   Ir US Guide  Vasc Access Right  05/06/2015  CLINICAL DATA:  Aspiration pneumonia EXAM: RIGHT UPPER EX PICC LINE PLACEMENT WITH ULTRASOUND AND FLUOROSCOPIC GUIDANCE FLUOROSCOPY TIME:  18 seconds PROCEDURE: The patient was advised of the possible risks and complications and agreed to undergo the procedure. The patient was then brought to the angiographic suite for the procedure. The right arm was prepped with chlorhexidine, draped in the usual sterile fashion using maximum barrier technique (cap and mask, sterile gown, sterile gloves, large sterile sheet, hand hygiene and cutaneous antisepsis) and infiltrated locally with 1% Lidocaine. Ultrasound demonstrated patency of the right basilic vein, and this was documented with an image. Under real-time ultrasound guidance, this vein was accessed with a 21 gauge micropuncture needle and image documentation was performed. A 0.018 wire was introduced in to the vein. Over this, a 5 [place] double lumen power PICC was advanced to the lower SVC/right atrial junction. Fluoroscopy during the procedure and fluoro spot radiograph confirms appropriate catheter position. The catheter was flushed and covered with a sterile dressing. COMPLICATIONS: None LENGTH: 38 cm IMPRESSION: Successful right arm power PICC line placement with ultrasound and fluoroscopic guidance. The catheter is ready for use. Electronically Signed  By: Marybelle Killings M.D.   On: 05/06/2015 12:57    Medications / Allergies:  Scheduled Meds: . antiseptic oral rinse  7 mL Mouth Rinse q12n4p  . aspirin  81 mg Oral Daily  . chlorhexidine  15 mL Mouth Rinse BID  . famotidine (PEPCID) IV  20 mg Intravenous Q24H  . heparin  5,000 Units Subcutaneous 3 times per day  . insulin aspart  0-20 Units Subcutaneous 6 times per day  . insulin glargine  20 Units Subcutaneous QHS  . meropenem (MERREM) IV  500 mg Intravenous Q12H  . sodium chloride  10-40 mL Intracatheter Q12H  . sodium chloride  3 mL Intravenous Q12H   Continuous  Infusions: . 0.45 % NaCl with KCl 20 mEq / L 20 mL/hr at 05/07/15 1749  . diltiazem (CARDIZEM) infusion 15 mg/hr (05/08/15 0238)  . Marland KitchenTPN (CLINIMIX-E) Adult 55 mL/hr at 05/07/15 1706   And  . fat emulsion 120 mL (05/07/15 1705)   PRN Meds:.acetaminophen **OR** acetaminophen, bismuth subsalicylate, diphenhydrAMINE, fentaNYL (SUBLIMAZE) injection, hydrALAZINE, levalbuterol, ondansetron (ZOFRAN) IV **OR** ondansetron (ZOFRAN) IV, promethazine, sodium chloride  Antibiotics: Anti-infectives    Start     Dose/Rate Route Frequency Ordered Stop   05/03/15 1200  vancomycin (VANCOCIN) 50 mg/mL oral solution 500 mg  Status:  Discontinued     500 mg Oral 4 times per day 05/03/15 0917 05/04/15 1306   05/02/15 1200  cefTRIAXone (ROCEPHIN) 1 g in dextrose 5 % 50 mL IVPB  Status:  Discontinued     1 g 100 mL/hr over 30 Minutes Intravenous Every 24 hours 05/01/15 1448 05/01/15 2004   05/02/15 0600  imipenem-cilastatin (PRIMAXIN) 250 mg in sodium chloride 0.9 % 100 mL IVPB  Status:  Discontinued     250 mg 200 mL/hr over 30 Minutes Intravenous 3 times per day 05/01/15 2025 05/02/15 0027   05/02/15 0030  meropenem (MERREM) 500 mg in sodium chloride 0.9 % 50 mL IVPB     500 mg 100 mL/hr over 30 Minutes Intravenous Every 12 hours 05/02/15 0027     05/01/15 2030  imipenem-cilastatin (PRIMAXIN) 250 mg in sodium chloride 0.9 % 100 mL IVPB     250 mg 200 mL/hr over 30 Minutes Intravenous NOW 05/01/15 2025 05/01/15 2110   04/30/15 1100  metroNIDAZOLE (FLAGYL) IVPB 500 mg  Status:  Discontinued     500 mg 100 mL/hr over 60 Minutes Intravenous Every 12 hours 04/30/15 0951 05/02/15 0751   04/30/15 1000  cefTRIAXone (ROCEPHIN) 1 g in dextrose 5 % 50 mL IVPB  Status:  Discontinued     1 g 100 mL/hr over 30 Minutes Intravenous Every 24 hours 04/30/15 0939 05/01/15 1205   04/30/15 0945  cefTRIAXone (ROCEPHIN) injection 1 g  Status:  Discontinued     1 g Intramuscular Every 24 hours 04/30/15 0930 04/30/15 0938    04/29/15 0200  imipenem-cilastatin (PRIMAXIN) 250 mg in sodium chloride 0.9 % 100 mL IVPB  Status:  Discontinued     250 mg 200 mL/hr over 30 Minutes Intravenous Every 8 hours 04/28/15 1812 04/30/15 0930   04/28/15 1830  imipenem-cilastatin (PRIMAXIN) 250 mg in sodium chloride 0.9 % 100 mL IVPB     250 mg 200 mL/hr over 30 Minutes Intravenous  Once 04/28/15 1812 04/28/15 1904          Assessment/Plan Acute necrotizing pancreatitis with SIRS Gallstones -WBC improved, temp curve down.  Will hold off on CT scan with contrast given improvement and AKI.  Will  consider soon if sCr continues to improve. -pt is hungry, ileus resolving, non tender.  From a surgical standpoint, may clamp NGT and allow for clears to see if able to tolerate PCM-TPN  VTE prophylaxis-SCD/heparin ID- Meropenem D#7 for sirs, uti   Erby Pian, ANP-BC [facility] Pager [phone])   05/08/2015 7:49 AM

## 2015-05-08 NOTE — Evaluation (Signed)
Physical Therapy Evaluation Patient Details Name: Victoria Morrow MRN: [id number] DOB: September 30, 1950 Today's Date: 05/08/2015   History of Present Illness  65 yo female admitted 04/28/15 with necrotizing pancreatitis. required ventilator  for aspiration. Extubated 1/5.  Clinical Impression  The patient was motivated to mobilize to recliner, present with very weak legs.  RR up to 44 with activity, sats >94 % on RA for the transfer. Placed back on 2 liters [place].  Pt admitted with above diagnosis. Pt currently with functional limitations due to the deficits listed below (see PT Problem List).  Pt will benefit from skilled PT to increase their independence and safety with mobility to allow discharge to the venue listed below.       Follow Up Recommendations SNF;Supervision/Assistance - 24 hour    Equipment Recommendations  None recommended by PT    Recommendations for Other Services OT consult     Precautions / Restrictions Precautions Precautions: Fall Precaution Comments: multi lines, monitor VS      Mobility  Bed Mobility Overal bed mobility: Needs Assistance;+2 for physical assistance;+ 2 for safety/equipment Bed Mobility: Supine to Sit;Rolling Rolling: Mod assist   Supine to sit: Mod assist;+2 for physical assistance;+2 for safety/equipment;HOB elevated     General bed mobility comments: cues to roll, assist with trunk to power up to sitting.  Transfers Overall transfer level: Needs assistance Equipment used: 2 person hand held assist Transfers: Sit to/from [employer] Sit to Stand: Max assist;+2 physical assistance;+2 safety/equipment Stand pivot transfers: Max assist;+2 physical assistance;+2 safety/equipment       General transfer comment: stands and bears weight with difficulty taking steps, tends to shuffle to turn to recliner.much support given through upper extremities.  Ambulation/Gait                Stairs            Wheelchair  Mobility    Modified Rankin (Stroke Patients Only)       Balance Overall balance assessment: Needs assistance Sitting-balance support: Bilateral upper extremity supported;Feet supported Sitting balance-Leahy Scale: Poor                                       Pertinent Vitals/Pain Pain Assessment: No/denies pain    Home Living Family/patient expects to be discharged to:: Private residence Living Arrangements: Spouse/significant other Available Help at Discharge: Family Type of Home: House       Home Layout: One level   Additional Comments: unable to obtain all info from patient.    Prior Function Level of Independence: Independent               Hand Dominance        Extremity/Trunk Assessment   Upper Extremity Assessment: Generalized weakness;RUE deficits/detail RUE Deficits / Details: a bit weaker than the left         Lower Extremity Assessment: Generalized weakness      Cervical / Trunk Assessment: Normal  Communication   Communication:  (voice is very quiet and labored.)  Cognition Arousal/Alertness: Awake/alert Behavior During Therapy: WFL for tasks assessed/performed Overall Cognitive Status: Impaired/Different from baseline Area of Impairment: Orientation Orientation Level: Time;Situation;Disoriented to                  General Comments      Exercises        Assessment/Plan    PT Assessment Patient needs continued  PT services  PT Diagnosis Difficulty walking;Generalized weakness;Altered mental status   PT Problem List Decreased strength;Decreased activity tolerance;Decreased balance;Decreased mobility;Decreased cognition;Decreased knowledge of use of DME;Decreased safety awareness;Decreased knowledge of precautions  PT Treatment Interventions DME instruction;Gait training;Functional mobility training;Therapeutic activities;Therapeutic exercise;Patient/family education   PT Goals (Current goals can be found in  the Care Plan section) Acute Rehab PT Goals Patient Stated Goal: to get strom=nger. PT Goal Formulation: With patient/family Time For Goal Achievement: 05/22/15 Potential to Achieve Goals: Good    Frequency Min 3X/week   Barriers to discharge        Co-evaluation               End of Session   Activity Tolerance: Patient limited by fatigue Patient left: in chair;with call bell/phone within reach;with chair alarm set;with family/visitor present Nurse Communication: Mobility status         Time: 1536-1600 PT Time Calculation (min) (ACUTE ONLY): 24 min   Charges:   PT Evaluation $PT Eval Moderate Complexity: 1 Procedure PT Treatments $Therapeutic Activity: 8-22 mins   PT G Codes:        Rada HayHill, Victoria Morrow 05/08/2015, 4:41 PM Blanchard KelchKaren Demeshia Morrow PT [phone]

## 2015-05-08 NOTE — Progress Notes (Addendum)
Pharmacy Antibiotic Follow-up Note  Victoria Morrow is a 65 y.o. year-old female admitted on 04/28/2015.  The patient is currently on day #7 of meropenem (Day #10 of antibiotics) for necrotizing pancreatitis (UTI treated). Plan per PCCM is total of 14-days of antibiotics. Renal function has improved, WBC remains unchanged w/ no fevers/24h.  .  Assessment/Plan:  Change to meropenem 1gm IV q12h for improved renal function  Temp (24hrs), Avg:98.7 F (37.1 C), Min:98.1 F (36.7 C), Max:99 F (37.2 C)   Recent Labs Lab 05/04/15 0337 05/05/15 0344 05/06/15 0325 05/07/15 0400 05/08/15 0440  WBC 14.3* 22.0* 25.6* 26.0* 25.0*    Recent Labs Lab 05/04/15 0337 05/05/15 0344 05/06/15 0325 05/07/15 0400 05/08/15 0440  CREATININE 2.28* 1.85* 1.79* 1.75* 1.42*   Estimated Creatinine Clearance: 35.9 mL/min (by C-G formula based on Cr of 1.42).    No Known Allergies  Antimicrobials this admission: 1/1 >> Primaxin >> 1/3 1/3 >> Rocephin >> 1/4 1/3 >> Flagyl >> 1/5 1/4 >> Primaxin >> 1/5 1/5 >> meropenem >> 1/6 >> PO vanc >> 1/7  Microbiology Results: 1/1 BCx: ngtd 1/1 MRSA PCR: neg 1/2 urine: >100k Klebsiella pneumo (R-amp) 1/5 blood: 1/2 ngtd, other canceled 1/5 trach asp: ngtd 1/6 C diff: neg  Levels/dose changes this admission: 1/11 change meropenem to 1gm IV q12h for improved renal function  Thank you for allowing pharmacy to be a part of this patient's care.  Juliette Alcideustin Zeigler, PharmD, BCPS.   Pager: [phone] 05/08/2015 10:53 AM

## 2015-05-08 NOTE — Progress Notes (Signed)
PULMONARY / CRITICAL CARE MEDICINE   Name: Victoria Morrow MRN: [id number] DOB: [birth year]    ADMISSION DATE:  04/28/2015 CONSULTATION DATE: 1/2  REFERRING MD:  Rito EhrlichKrishnan  CHIEF COMPLAINT:  Hypotension and pancreatitis   BRIEF DESCRIPTION:   65 year old female with PMH DM II admitted on 1/1 with severe acute pancreatitis believed to be related to gallstones but no CBD or pancreatic duct dilation on admission.  Takes ACE inhibitor baseline.  Had mild gall bladder wall thickening on ultrasound with 5mm stone in neck of gallbladder.  Aspirated on 1/4, requiring intubation, extubated 1/5. Fever 1/5.   SIGNIFICANT EVENTS: 1/4 vomited, aspirated, intubated, shock 1/5 extubated 1/7 SVT requiring cardizem gtt 1/8 Vomiting - NG to LIS  SUBJECTIVE: RN reports HR maintaining in 90's-110's, NGT to LIS with 1.1 L out over 24 hours, WBC decreased to 25k, afebrile (99)   VITAL SIGNS: BP 137/57 mmHg  Pulse 110  Temp(Src) 99 F (37.2 C) (Oral)  Resp 33  Ht 5\' 2"  (1.575 m)  Wt 155 lb 6.8 oz (70.5 kg)  BMI 28.42 kg/m2  SpO2 98%  HEMODYNAMICS:    VENTILATOR SETTINGS:    INTAKE / OUTPUT: I/O last 3 completed shifts: In: 3378.8 [P.O.:30; I.V.:1231.7; NG/GT:10; IV Piggyback:300] Out: 3600 [Urine:1950; Emesis/NG output:1600; Stool:50]  PHYSICAL EXAMINATION: General:  Awake, alert, conversant , mild resp distress HENT: NCAT, no JVD PULM: CTA B, normal effort CV: RRR, no mgr GI: Belly soft, distended, no tenderness MSK: normal bulk and tone Neuro: awake, alert, no distress  LABS:  BMET  Recent Labs Lab 05/06/15 0325 05/07/15 0400 05/08/15 0440  NA 148* 151* 152*  K 3.5 3.8 4.2  CL 120* 123* 124*  CO2 18* 19* 19*  BUN 29* 28* 29*  CREATININE 1.79* 1.75* 1.42*  GLUCOSE 175* 227* 278*    Electrolytes  Recent Labs Lab 05/06/15 0325 05/07/15 0400 05/08/15 0440  CALCIUM 8.8* 9.1 9.2  MG 2.1 2.1 1.9  PHOS 2.8 2.6 3.1    CBC  Recent Labs Lab 05/06/15 0325  05/07/15 0400 05/08/15 0440  WBC 25.6* 26.0* 25.0*  HGB 9.2* 8.7* 7.9*  HCT 27.5* 25.8* 24.2*  PLT 498* 572* 545*    Coag's No results for input(s): APTT, INR in the last 168 hours.  Sepsis Markers No results for input(s): LATICACIDVEN, PROCALCITON, O2SATVEN in the last 168 hours.  ABG  Recent Labs Lab 05/01/15 1547 05/01/15 2100 05/02/15 0435  PHART 7.431 7.429 7.448  PCO2ART 20.9* 27.6* 27.8*  PO2ART 203* 123* 79.9*    Liver Enzymes  Recent Labs Lab 05/06/15 0325  AST 32  ALT 25  ALKPHOS 92  BILITOT 0.8  ALBUMIN 2.1*    Cardiac Enzymes No results for input(s): TROPONINI, PROBNP in the last 168 hours.  Glucose  Recent Labs Lab 05/07/15 1249 05/07/15 1553 05/07/15 1933 05/07/15 2350 05/08/15 0337 05/08/15 0707  GLUCAP 230* 212* 221* 218* 197* 245*    Imaging  STUDIES:  CT abd 1/1 1. Upper abdominal edema, eccentric right. Centered about the pancreatic head, uncinate process, and neck. Favor pancreatitis. Correlate with pancreatic enzymes (pending). If these are abnormal, less likely differential consideration would include peptic ulcer Disease. 2. Cholelithiasis without specific evidence of cholecystitis or biliary duct dilatation. 3. Trace perihepatic ascites, likely secondary. 4. Small hiatal hernia. 5. Pelvic floor laxity. 6. Renal atrophy. US abd 1/1: 1. Gallbladder wall thickening, with mild pericholecystic fluid.This is nonspecific in the presence of the patient's known pancreatitis. Stones noted within the gallbladder, with a  5 mm stone lodged at the neck of the gallbladder. Mild acute cholecystitis cannot be entirely excluded, but is considered less likely. 2. No evidence for bile duct obstruction. The patient's apparent duodenal diverticulum is not well characterized on ultrasound. CT abdomen/pelvis 1/5 > necrotic pancreas most likely, limited by lack of contrast, no cholecystitis, small stones, prox small bowel dilation, likely ileus, increased  bilateral atelectasis/effusions, some peritoneal fluid  1/9  CXR images personally reviewed> low lung volumes, bibasal atelectasis, no significant airspace disease  CULTURES: BCX2 1/1>>> neg UC 1/1>>> Klebsiella, amp resistant BC x2 1/5 > ng Resp cx 1/5 >  C diff 1/6 > neg  ANTIBIOTICS: Imipenem 1/1>>> 1/3 Ceftriaxone 1/3 > 1/4 Flagyl 1/3 > 1/5 Meropenem 1/4 >  Oral Vanc 1/6> 1/8  LINES/TUBES: 1/4 ETT > 1/5 1/4 L IJ CVL >   DISCUSSION: 65 year old female admitted severe acute pancreatitis, presumably gallstone related but no obstructing stone seen on imaging aside from one in neck of gallbladder.  Aspirated on 1/4, requiring intubation and central line placement.  CT abdomen on 1/4 shows likely pancreatic necrosis. Extubated 1/5, diarrhea overnight.  ASSESSMENT / PLAN:  PULMONARY A: Acute respiratory failure with hypoxemia > resolved P:   Incentive spirometry / flutter valve Mobilize as able  PRN xopenex  CARDIOVASCULAR A:  Shock - off neo since 1/7 Transient Afib 1/6, 1/7 - now in s tach on cardizem, frequent PVC's / SVT PVC / Transient SVT  P:  Continue cardizem gtt  ICU monitoring   RENAL A:   AKI, UO  improving Hyperchloremia  Hypernatremia  Hypocalcemia Hypokalemia P:   Change IVF to D1/2 NS with KCL Replete lytes I&O strict  GASTROINTESTINAL A:   Acute pancreatitis - likely necrosis ? etio  Gall bladder wall thickening, GB stone but neg Murphy's sign 1/3 Ileus - improved Diarrhea P:   NPO, aspiration precautions Continue TNA per Pharmacy, appreciate assistance PRN zofran Cholecystectomy eventually per gen surgery once pt stabilized  Consider rpt CT since rising WBC, defer to CCS  HEMATOLOGIC A:   Anemia Leucocytosis P:  Trend CBC Transfuse per ICU protocol  Hurley heparin for DVT prophylaxis  INFECTIOUS A:   SIRS due to pancreatitis  Klebiella UTI WBC up 1/6, diarrhea, multiple antibiotics, at risk for C diff P:   Continue  meropenem x 14 days total  Follow cultures / abx as above  ENDOCRINE A:   Hyperglycemia P:   ICU glycemic control protocol  Increase lantus to 25 units QD  NEUROLOGIC A:   Pain control Extreme deconditioning P:   Fentanyl PRN PT consult   FAMILY  - Updates:  No family available am 1/11  - Inter-disciplinary family meet or Palliative Care meeting due by:  NA  Summary - Etiology of acute pancreatitis not clear, recovering organ failure, but rising WBCs, on TNA Attempt to mobilize.     Canary Brim, NP-C [facility] Pgr: [phone] or if no answer [phone] 05/08/2015, 8:59 AM

## 2015-05-08 NOTE — Progress Notes (Addendum)
PARENTERAL NUTRITION CONSULT NOTE - FOLLOW-UP  Pharmacy Consult for TPN Indication: severe necrotizing pancreatitis with ileus  No Known Allergies  Patient Measurements: Height: 5\' 2"  (157.5 cm) Weight: 155 lb 6.8 oz (70.5 kg) IBW/kg (Calculated) : 50.1   Vital Signs: Temp: 99 F (37.2 C) (01/11 0711) Temp Source: Oral (01/11 0711) BP: 137/57 mmHg (01/11 0700) Pulse Rate: 110 (01/11 0700) Intake/Output from previous day: 01/10 0701 - 01/11 0700 In: 2685.8 [P.O.:30; I.V.:638.7; NG/GT:10; IV Piggyback:200; TPN:1807.1] Out: 2600 [Urine:1400; Emesis/NG output:1150; Stool:50]    Labs:  Recent Labs  05/06/15 0325 05/07/15 0400 05/08/15 0440  WBC 25.6* 26.0* 25.0*  HGB 9.2* 8.7* 7.9*  HCT 27.5* 25.8* 24.2*  PLT 498* 572* 545*     Recent Labs  05/06/15 0325 05/07/15 0400 05/08/15 0440  NA 148* 151* 152*  K 3.5 3.8 4.2  CL 120* 123* 124*  CO2 18* 19* 19*  GLUCOSE 175* 227* 278*  BUN 29* 28* 29*  CREATININE 1.79* 1.75* 1.42*  CALCIUM 8.8* 9.1 9.2  MG 2.1 2.1 1.9  PHOS 2.8 2.6 3.1  PROT 6.0*  --   --   ALBUMIN 2.1*  --   --   AST 32  --   --   ALT 25  --   --   ALKPHOS 92  --   --   BILITOT 0.8  --   --   PREALBUMIN 6.9*  --   --   TRIG 139  --   --    Estimated Creatinine Clearance: 35.9 mL/min (by C-G formula based on Cr of 1.42).    Recent Labs  05/07/15 2350 05/08/15 0337 05/08/15 0707  GLUCAP 218* 197* 245*    Medical History: Past Medical History  Diagnosis Date  . Olivopontocerebellar atrophy (HCC)     Mgmt Dr Vickey Huger  . Diabetes mellitus     Type II. Insulin dependent. Diabetic nephropathy.    Insulin Requirements:  Currently on lantus 20 units daily,  resistant SSI q4h using 39 units/24h, and 40 units of regular insulin added to TPN  Current Nutrition: NPO   IVF: 1/2NS + KCl at 66ml/hr  Central access: PICC per IR TPN start date: 1/9  ASSESSMENT                                                                                                           HPI:  Victoria Morrow presents with N/V and abdominal pain on 1/1, found to have gallstone pancreatitis.  CT revealing necrotizing pancreatitis. Surgery note form 1/8 states severe abdominal distention and ileus.  Pharmacy asked to start TPN on 1/7 but PICC has not been able to be placed by IV team.  Plans for IR to place PICC. Of note, she has DM on lantus and metformin prior to admission as well as hyperlipidemia on statin therapy.   Significant events:   05/07/2015 - Stool occurrence x1  CCS considering clamping NG tube  Today:   Glucose - in last 24 hours on TPN, CBG x6 above goal of < 150mg /dl.  Lantus dose not given 1/10 hs as dose changed from 10 units BID to 20 units qam  Electrolytes - Na and Cl elevated, HCO3 decreased; Corr Ca increased to 10.72 (Ca x Phos < 55), Mg, phos WNL  Renal - AKI improving, UOP/24h = 0.566ml/kg/hr, +0.8L/24h, NG tube 625ml  LFTs - WNL (1/9)  TGs - 139 (1/9)  Prealbumin - 6.9 (1/9)  NUTRITIONAL GOALS                                                                                           RD recs: Kcal: 1800-2000 Protein: 85-95 grams Fluid: 2-2.2 L/day  Clinimix E 5/15 at a goal rate of 75 ml/hr + 20% fat emulsion at 10 ml/hr to provide: 90 g/day protein, 1758 Kcal/day. (could consider increase to 680ml/hr if tolerates)  PLAN                                                                                                                         At 1800 today:  Continue Clinimix E5/15 at 4955ml/hr (remains below goal).  Will not advance due to hyperglycemia  TPN will be hypotonic so should have little to no contribution to hypernatremia.  20% fat emulsion at 185ml/hr.  Currently no signs of refeeding syndrome.  Increase to 55 units of regular insulin in TPN (based on SSI use) and titrate as appropriate, monitoring for possible changes in Lantus dose.   Plan to advance as tolerated to the goal rate - CBGs may prevent from  rapid advancement of TPN  TPN to contain standard multivitamins and trace elements.  Continue IVF at 8420ml/hr for current TPN rate  Continue SSI/Lantus as ordered.   TPN lab panels on Mondays & Thursdays.  F/u daily.  Labs in am.   Celesta AverAmanda D'Ostroph, PharmD candidate  Reviewed and made appropriate changes to above note and agree with plan as outlined  Juliette Alcideustin Zeigler, PharmD, BCPS.   Pager: [phone] 05/08/2015 11:03 AM

## 2015-05-09 ENCOUNTER — Inpatient Hospital Stay (HOSPITAL_COMMUNITY): Payer: Commercial Managed Care - HMO

## 2015-05-09 LAB — COMPREHENSIVE METABOLIC PANEL
ALT: 26 U/L (ref 14–54)
ANION GAP: 9 (ref 5–15)
AST: 48 U/L — ABNORMAL HIGH (ref 15–41)
Albumin: 1.7 g/dL — ABNORMAL LOW (ref 3.5–5.0)
Alkaline Phosphatase: 141 U/L — ABNORMAL HIGH (ref 38–126)
BUN: 28 mg/dL — ABNORMAL HIGH (ref 6–20)
CHLORIDE: 124 mmol/L — AB (ref 101–111)
CO2: 18 mmol/L — ABNORMAL LOW (ref 22–32)
CREATININE: 1.4 mg/dL — AB (ref 0.44–1.00)
Calcium: 10.3 mg/dL (ref 8.9–10.3)
GFR, EST AFRICAN AMERICAN: 44 mL/min — AB (ref >60)
GFR, EST NON AFRICAN AMERICAN: 38 mL/min — AB (ref >60)
Glucose, Bld: 282 mg/dL — ABNORMAL HIGH (ref 65–99)
POTASSIUM: 4.5 mmol/L (ref 3.5–5.1)
Sodium: 151 mmol/L — ABNORMAL HIGH (ref 135–145)
Total Bilirubin: 0.8 mg/dL (ref 0.3–1.2)
Total Protein: 5.7 g/dL — ABNORMAL LOW (ref 6.5–8.1)

## 2015-05-09 LAB — PHOSPHORUS: PHOSPHORUS: 3.1 mg/dL (ref 2.5–4.6)

## 2015-05-09 LAB — GLUCOSE, CAPILLARY
GLUCOSE-CAPILLARY: 228 mg/dL — AB (ref 65–99)
GLUCOSE-CAPILLARY: 249 mg/dL — AB (ref 65–99)
GLUCOSE-CAPILLARY: 290 mg/dL — AB (ref 65–99)
Glucose-Capillary: 235 mg/dL — ABNORMAL HIGH (ref 65–99)
Glucose-Capillary: 236 mg/dL — ABNORMAL HIGH (ref 65–99)
Glucose-Capillary: 243 mg/dL — ABNORMAL HIGH (ref 65–99)
Glucose-Capillary: 269 mg/dL — ABNORMAL HIGH (ref 65–99)

## 2015-05-09 LAB — MAGNESIUM: Magnesium: 1.8 mg/dL (ref 1.7–2.4)

## 2015-05-09 MED ORDER — METOPROLOL TARTRATE 1 MG/ML IV SOLN
2.5000 mg | INTRAVENOUS | Status: DC | PRN
  Administered 2015-05-12: 2.5 mg via INTRAVENOUS
  Administered 2015-05-12: 5 mg via INTRAVENOUS
  Administered 2015-05-13: 4 mg via INTRAVENOUS
  Administered 2015-05-24 (×2): 5 mg via INTRAVENOUS
  Filled 2015-05-09 (×4): qty 5

## 2015-05-09 MED ORDER — METOPROLOL TARTRATE 1 MG/ML IV SOLN
5.0000 mg | Freq: Three times a day (TID) | INTRAVENOUS | Status: DC
  Administered 2015-05-09 – 2015-05-14 (×16): 5 mg via INTRAVENOUS
  Filled 2015-05-09 (×16): qty 5

## 2015-05-09 MED ORDER — FAT EMULSION 20 % IV EMUL
120.0000 mL | INTRAVENOUS | Status: AC
  Administered 2015-05-09: 120 mL via INTRAVENOUS
  Filled 2015-05-09: qty 250

## 2015-05-09 MED ORDER — DEXTROSE 5 % IV SOLN
INTRAVENOUS | Status: DC
  Administered 2015-05-09 – 2015-05-11 (×2): via INTRAVENOUS

## 2015-05-09 MED ORDER — TRACE MINERALS CR-CU-MN-SE-ZN 10-1000-500-60 MCG/ML IV SOLN
INTRAVENOUS | Status: AC
  Administered 2015-05-09: 18:00:00 via INTRAVENOUS
  Filled 2015-05-09: qty 1320

## 2015-05-09 MED ORDER — INSULIN GLARGINE 100 UNIT/ML ~~LOC~~ SOLN
30.0000 [IU] | Freq: Every day | SUBCUTANEOUS | Status: DC
  Filled 2015-05-09 (×2): qty 0.3

## 2015-05-09 NOTE — Progress Notes (Signed)
Nutrition Follow-up  INTERVENTION:   TPN per Pharmacy Encourage PO intake RD to continue to monitor for further diet advancement and tolerance  NUTRITION DIAGNOSIS:   Inadequate oral intake related to inability to eat as evidenced by NPO status./ Clear liquid diet  Ongoing.  GOAL:   Patient will meet greater than or equal to 90% of their needs  Progressing.  MONITOR:   PO intake, Diet advancement, Labs, Weight trends, Skin, I & O's, Other (Comment) (TPN)  REASON FOR ASSESSMENT:   Consult New TPN/TNA  ASSESSMENT:   Victoria Morrow is a 65 y.o. female with a past medical history of type 2 diabetes on insulin, chronic kidney disease of unknown stage, hyperlipidemia, who was in her usual state of health till earlier today when she started developing nausea followed by vomiting. She also started having upper abdominal pain. Symptoms got worse and she decided to come into the hospital for further evaluation. She denies having had similar symptoms in the past. Pain is located in the upper abdomen without any radiation. It's achy pain 10 out of 10 in intensity. No aggravating or relieving factors. No known precipitant factors. Denies any recent antibiotic use. No sick contacts. No recent hospitalization. Denies any fever but has had chills. Denies any blood in the emesis or in the stool.  Pt's diet advanced to clears, expected to advance to fulls and to have NGT d/c per surgery. Pt tolerating clears like juice and beef broth. NGT was still in during visit. Pt declining supplements at this time. RD will continue to monitor for diet advancement and tolerance of liquids.   Plan per Pharmacy: At 1800 today:  Switch to Clinimix 5/15 at 6155ml/hr (remains below goal).  Remove electrolytes due to hypercalcemia (d/w PCCM)  Will not advance due to hyperglycemia  TPN will be hypotonic so should have little to no contribution to hypernatremia.  20% fat emulsion at 765ml/hr - advance to 6910ml/hr  when able to advance clinimix  Currently no signs of refeeding syndrome.  Goal: Clinimix E 5/15 at a goal rate of 75 ml/hr + 20% fat emulsion at 10 ml/hr to provide: 90 g/day protein, 1758 Kcal/day. (could consider increase to 5180ml/hr if tolerates)  Labs reviewed: CBGs: 235-269 Elevated Na, BUN, Creatinine Mg/Phos WNL  Diet Order:  Diet clear liquid Room service appropriate?: Yes; Fluid consistency:: Thin TPN (CLINIMIX-E) Adult TPN (CLINIMIX) Adult without lytes  Skin:  Reviewed, no issues  Last BM:  1/10  Height:   Ht Readings from Last 1 Encounters:  05/01/15 5\' 2"  (1.575 m)    Weight:   Wt Readings from Last 1 Encounters:  05/09/15 139 lb 5.3 oz (63.2 kg)    Ideal Body Weight:  50 kg  BMI:  Body mass index is 25.48 kg/(m^2).  Estimated Nutritional Needs:   Kcal:  1800-2000  Protein:  85-95 grams  Fluid:  2-2.2 L/day  EDUCATION NEEDS:   No education needs identified at this time  Tilda FrancoLindsey Shine Mikes, MS, RD, LDN Pager: [phone] After Hours Pager: [phone]

## 2015-05-09 NOTE — Progress Notes (Addendum)
PARENTERAL NUTRITION CONSULT NOTE - FOLLOW-UP  Pharmacy Consult for TPN Indication: severe necrotizing pancreatitis with ileus  No Known Allergies  Patient Measurements: Height: 5' 2"  (157.5 cm) Weight: 139 lb 5.3 oz (63.2 kg) IBW/kg (Calculated) : 50.1   Vital Signs: Temp: 97.8 F (36.6 C) (01/12 0330) Temp Source: Oral (01/12 0000) BP: 143/66 mmHg (01/12 0400) Pulse Rate: 112 (01/12 0400) Intake/Output from previous day: 01/11 0701 - 01/12 0700 In: 2663.3 [I.V.:1193.3; NG/GT:60; IV Piggyback:150; TPN:1260] Out: 2320 [Urine:2120; Emesis/NG output:200] Total I/O In: -  Out: 225 [Urine:225]  Labs:  Recent Labs  05/07/15 0400 05/08/15 0440  WBC 26.0* 25.0*  HGB 8.7* 7.9*  HCT 25.8* 24.2*  PLT 572* 545*     Recent Labs  05/07/15 0400 05/08/15 0440 05/09/15 0400  NA 151* 152* 151*  K 3.8 4.2 4.5  CL 123* 124* 124*  CO2 19* 19* 18*  GLUCOSE 227* 278* 282*  BUN 28* 29* 28*  CREATININE 1.75* 1.42* 1.40*  CALCIUM 9.1 9.2 10.3  MG 2.1 1.9 1.8  PHOS 2.6 3.1 3.1  PROT  --   --  5.7*  ALBUMIN  --   --  1.7*  AST  --   --  48*  ALT  --   --  26  ALKPHOS  --   --  141*  BILITOT  --   --  0.8   Estimated Creatinine Clearance: 34.5 mL/min (by C-G formula based on Cr of 1.4).    Recent Labs  05/08/15 2019 05/09/15 0004 05/09/15 0344  GLUCAP 340* 228* 243*    Medical History: Past Medical History  Diagnosis Date  . Olivopontocerebellar atrophy (HCC)     Mgmt Dr Brett Fairy  . Diabetes mellitus     Type II. Insulin dependent. Diabetic nephropathy.    Insulin Requirements:  Currently on lantus 25 units daily,  resistant SSI q4h using 66 units/24h, and 55 units of regular insulin added to TPN  Current Nutrition: NPO   IVF: 1/2NS + KCl 45mq at 244mhr, changing to D5W at 5031mr  Central access: PICC per IR TPN start date: 1/9  ASSESSMENT                                                                                                          HPI:   [id number] presents with N/V and abdominal pain on 1/1, found to have gallstone pancreatitis.  CT revealing necrotizing pancreatitis. Surgery note form 1/8 states severe abdominal distention and ileus.  Pharmacy asked to start TPN on 1/7 but PICC has not been able to be placed by IV team.  Plans for IR to place PICC. Of note, she has DM on lantus and metformin prior to admission as well as hyperlipidemia on statin therapy.   Significant events:   05/07/2015 - Stool occurrence x1  CCS considering stopping NG tube and advancing to fulls  Today:   Glucose - in last 24 hours on TPN, CBG x6 above goal of < 150m34m. Lantus dose was increased to 25 units daily (1/11).  Possible  patient's CLQ diet contributing to increasing CBGs, patient states she had two full glasses cranberry juice 1/11  Electrolytes - Na and Cl elevated, HCO3 decreased; Corr Ca increased to 12.14 (Ca x Phos < 55), Mg, phos WNL  Renal - AKI improving, UOP/24h = 1.47m/kg/hr, -0.37L/24h, NG tube 7232m LFTs - AST elevated at 48, ALT WNL, alk phos elevated (1/12)  TGs - 139 (1/9)  Prealbumin - 6.9 (1/9)  NUTRITIONAL GOALS                                                                                           RD recs: Kcal: 1800-2000 Protein: 85-95 grams Fluid: 2-2.2 L/day  Clinimix E 5/15 at a goal rate of 75 ml/hr + 20% fat emulsion at 10 ml/hr to provide: 90 g/day protein, 1758 Kcal/day. (could consider increase to 8036mr if tolerates)  PLAN                                                                                                                         At 1800 today:  Switch to Clinimix 5/15 at 63m20m (remains below goal).  Remove electrolytes due to hypercalcemia (d/w PCCM)  Will not advance due to hyperglycemia  TPN will be hypotonic so should have little to no contribution to hypernatremia.  20% fat emulsion at 5ml/41m- advance to 10ml/65mhen able to advance clinimix  Currently no signs of refeeding  syndrome.  Increase to 80 units of regular insulin in TPN (based on SSI use) and titrate as appropriate, monitoring for possible changes in Lantus dose.   Plan to advance as tolerated to the goal rate - CBGs  preventing rapid advancement of TPN.  TPN to contain standard multivitamins and trace elements.  Continue IVF at 20ml/h35mr current TPN rate.  Continue SSI/Lantus as ordered.   Keep famotidine outside TPN at this time  TPN lab panels on Mondays & Thursdays.  F/u daily.  Labs in am.   Amanda Carolin CoyD Candidate 05/09/2015 7:59 AM  Dustin Doreene ElandD, BCPS.   Pager: [phone] 8:58 AM

## 2015-05-09 NOTE — Progress Notes (Signed)
Patient ID: Victoria Morrow, female   DOB: 1950/08/28, 64 y.o.   MRN: [id number]     [address] [place], [place] [address]    Phone: [phone] FAX: [phone]     Subjective: Sitting up in a chair and eating liquids.  Denies pain.  Last PO pain meds 1807 last evening.  having BMs.  No n/v.  Tachypnic.  Objective:  Vital signs:  Filed Vitals:   05/09/15 0300 05/09/15 0330 05/09/15 0400 05/09/15 0553  BP: 143/74  143/66   Pulse: 124  112   Temp:  97.8 F (36.6 C)    TempSrc:      Resp: 37  34   Height:      Weight:    63.2 kg (139 lb 5.3 oz)  SpO2: 100%  100%     Last BM Date: 05/07/15  Intake/Output   Yesterday:  01/11 0701 - 01/12 0700 In: 8338.2 [I.V.:1193.3; NG/GT:60; IV Piggyback:150; TPN:1260] Out: 2320 [Urine:2120; Emesis/NG output:200] This shift:  Total I/O In: -  Out: 225 [Urine:225]   Physical Exam: General: Pt awake/alert/oriented x4 in no acute distress  Abdomen: +BS, abdomen is soft, non tender.   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[place])    Results:   Labs: Results for orders placed or performed during the hospital encounter of 04/28/15 (from the past 48 hour(s))  Glucose, capillary     Status: Abnormal   Collection Time: 05/07/15  8:17 AM  Result Value Ref Range   Glucose-Capillary 201 (H) 65 - 99 mg/dL   Comment 1 Notify RN    Comment 2 Document in Chart   Glucose, capillary     Status: Abnormal    Collection Time: 05/07/15 12:49 PM  Result Value Ref Range   Glucose-Capillary 230 (H) 65 - 99 mg/dL  Glucose, capillary     Status: Abnormal   Collection Time: 05/07/15  3:53 PM  Result Value Ref Range   Glucose-Capillary 212 (H) 65 - 99 mg/dL  Glucose, capillary     Status: Abnormal   Collection Time: 05/07/15  7:33 PM  Result Value Ref Range   Glucose-Capillary 221 (H) 65 - 99 mg/dL   Comment 1 Notify RN   Glucose, capillary     Status: Abnormal   Collection Time: 05/07/15 11:50 PM  Result Value Ref Range   Glucose-Capillary 218 (H) 65 - 99 mg/dL   Comment 1 Notify RN   Glucose, capillary     Status: Abnormal   Collection Time: 05/08/15  3:37 AM  Result Value Ref Range   Glucose-Capillary 197 (H) 65 - 99 mg/dL   Comment 1 Notify RN   CBC     Status: Abnormal   Collection Time: 05/08/15  4:40 AM  Result Value Ref Range   WBC 25.0 (H) 4.0 - 10.5 K/uL   RBC 2.84 (L) 3.87 - 5.11 MIL/uL   Hemoglobin 7.9 (L) 12.0 - 15.0 g/dL  HCT 24.2 (L) 36.0 - 46.0 %   MCV 85.2 78.0 - 100.0 fL   MCH 27.8 26.0 - 34.0 pg   MCHC 32.6 30.0 - 36.0 g/dL   RDW 13.1 11.5 - 15.5 %   Platelets 545 (H) 150 - 400 K/uL  Basic metabolic panel     Status: Abnormal   Collection Time: 05/08/15  4:40 AM  Result Value Ref Range   Sodium 152 (H) 135 - 145 mmol/L   Potassium 4.2 3.5 - 5.1 mmol/L   Chloride 124 (H) 101 - 111 mmol/L   CO2 19 (L) 22 - 32 mmol/L   Glucose, Bld 278 (H) 65 - 99 mg/dL   BUN 29 (H) 6 - 20 mg/dL   Creatinine, Ser 1.42 (H) 0.44 - 1.00 mg/dL   Calcium 9.2 8.9 - 10.3 mg/dL   GFR calc non Af Amer 38 (L) >60 mL/min   GFR calc Af Amer 44 (L) >60 mL/min    Comment: (NOTE) The eGFR has been calculated using the CKD EPI equation. This calculation has not been validated in all clinical situations. eGFR's persistently <60 mL/min signify possible Chronic Kidney Disease.    Anion gap 9 5 - 15  Magnesium     Status: None   Collection Time: 05/08/15  4:40 AM  Result Value Ref Range    Magnesium 1.9 1.7 - 2.4 mg/dL  Phosphorus     Status: None   Collection Time: 05/08/15  4:40 AM  Result Value Ref Range   Phosphorus 3.1 2.5 - 4.6 mg/dL  Glucose, capillary     Status: Abnormal   Collection Time: 05/08/15  7:07 AM  Result Value Ref Range   Glucose-Capillary 245 (H) 65 - 99 mg/dL   Comment 1 Notify RN   Glucose, capillary     Status: Abnormal   Collection Time: 05/08/15 12:37 PM  Result Value Ref Range   Glucose-Capillary 341 (H) 65 - 99 mg/dL   Comment 1 Notify RN    Comment 2 Document in Chart   Glucose, capillary     Status: Abnormal   Collection Time: 05/08/15  5:29 PM  Result Value Ref Range   Glucose-Capillary 319 (H) 65 - 99 mg/dL  Glucose, capillary     Status: Abnormal   Collection Time: 05/08/15  8:19 PM  Result Value Ref Range   Glucose-Capillary 340 (H) 65 - 99 mg/dL   Comment 1 Notify RN   Glucose, capillary     Status: Abnormal   Collection Time: 05/09/15 12:04 AM  Result Value Ref Range   Glucose-Capillary 228 (H) 65 - 99 mg/dL   Comment 1 Notify RN   Glucose, capillary     Status: Abnormal   Collection Time: 05/09/15  3:44 AM  Result Value Ref Range   Glucose-Capillary 243 (H) 65 - 99 mg/dL  Comprehensive metabolic panel     Status: Abnormal   Collection Time: 05/09/15  4:00 AM  Result Value Ref Range   Sodium 151 (H) 135 - 145 mmol/L   Potassium 4.5 3.5 - 5.1 mmol/L   Chloride 124 (H) 101 - 111 mmol/L   CO2 18 (L) 22 - 32 mmol/L   Glucose, Bld 282 (H) 65 - 99 mg/dL   BUN 28 (H) 6 - 20 mg/dL   Creatinine, Ser 1.40 (H) 0.44 - 1.00 mg/dL   Calcium 10.3 8.9 - 10.3 mg/dL   Total Protein 5.7 (L) 6.5 - 8.1 g/dL   Albumin 1.7 (L) 3.5 - 5.0 g/dL  AST 48 (H) 15 - 41 U/L   ALT 26 14 - 54 U/L   Alkaline Phosphatase 141 (H) 38 - 126 U/L   Total Bilirubin 0.8 0.3 - 1.2 mg/dL   GFR calc non Af Amer 38 (L) >60 mL/min   GFR calc Af Amer 44 (L) >60 mL/min    Comment: (NOTE) The eGFR has been calculated using the CKD EPI equation. This calculation  has not been validated in all clinical situations. eGFR's persistently <60 mL/min signify possible Chronic Kidney Disease.    Anion gap 9 5 - 15  Magnesium     Status: None   Collection Time: 05/09/15  4:00 AM  Result Value Ref Range   Magnesium 1.8 1.7 - 2.4 mg/dL  Phosphorus     Status: None   Collection Time: 05/09/15  4:00 AM  Result Value Ref Range   Phosphorus 3.1 2.5 - 4.6 mg/dL    Imaging / Studies: Dg Chest Port 1 View  05/09/2015  CLINICAL DATA:  Respiratory failure. EXAM: PORTABLE CHEST 1 VIEW COMPARISON:  05/06/2015. FINDINGS: Right PICC line and NG tube in good anatomic position. Low lung volumes with bibasilar and/or infiltrates left side greater than right again noted. No prominent pleural effusion. No pneumothorax. IMPRESSION: 1. Right PICC line and NG tube in good anatomic position. 2. Lung volumes with persistent bibasilar atelectasis and/or infiltrates, left side greater than right. No significant interim change. Electronically Signed   By: Marcello Moores  Register   On: 05/09/2015 07:15    Medications / Allergies:  Scheduled Meds: . antiseptic oral rinse  7 mL Mouth Rinse q12n4p  . aspirin  81 mg Oral Daily  . chlorhexidine  15 mL Mouth Rinse BID  . famotidine (PEPCID) IV  20 mg Intravenous Q24H  . heparin  5,000 Units Subcutaneous 3 times per day  . insulin aspart  0-20 Units Subcutaneous 6 times per day  . insulin glargine  25 Units Subcutaneous Daily  . meropenem (MERREM) IV  1 g Intravenous Q12H  . sodium chloride  10-40 mL Intracatheter Q12H  . sodium chloride  3 mL Intravenous Q12H   Continuous Infusions: . dextrose 5 % and 0.45 % NaCl with KCl 20 mEq/L 50 mL/hr at 05/08/15 1900  . diltiazem (CARDIZEM) infusion 15 mg/hr (05/09/15 0331)  . Marland KitchenTPN (CLINIMIX-E) Adult 55 mL/hr at 05/08/15 1900   And  . fat emulsion 120 mL (05/08/15 1900)   PRN Meds:.acetaminophen **OR** acetaminophen, bismuth subsalicylate, diphenhydrAMINE, fentaNYL (SUBLIMAZE) injection,  hydrALAZINE, levalbuterol, ondansetron (ZOFRAN) IV **OR** ondansetron (ZOFRAN) IV, promethazine, sodium chloride  Antibiotics: Anti-infectives    Start     Dose/Rate Route Frequency Ordered Stop   05/08/15 2200  meropenem (MERREM) 1 g in sodium chloride 0.9 % 100 mL IVPB     1 g 200 mL/hr over 30 Minutes Intravenous Every 12 hours 05/08/15 1054     05/03/15 1200  vancomycin (VANCOCIN) 50 mg/mL oral solution 500 mg  Status:  Discontinued     500 mg Oral 4 times per day 05/03/15 0917 05/04/15 1306   05/02/15 1200  cefTRIAXone (ROCEPHIN) 1 g in dextrose 5 % 50 mL IVPB  Status:  Discontinued     1 g 100 mL/hr over 30 Minutes Intravenous Every 24 hours 05/01/15 1448 05/01/15 2004   05/02/15 0600  imipenem-cilastatin (PRIMAXIN) 250 mg in sodium chloride 0.9 % 100 mL IVPB  Status:  Discontinued     250 mg 200 mL/hr over 30 Minutes Intravenous 3 times per day  05/01/15 2025 05/02/15 0027   05/02/15 0030  meropenem (MERREM) 500 mg in sodium chloride 0.9 % 50 mL IVPB  Status:  Discontinued     500 mg 100 mL/hr over 30 Minutes Intravenous Every 12 hours 05/02/15 0027 05/08/15 1054   05/01/15 2030  imipenem-cilastatin (PRIMAXIN) 250 mg in sodium chloride 0.9 % 100 mL IVPB     250 mg 200 mL/hr over 30 Minutes Intravenous NOW 05/01/15 2025 05/01/15 2110   04/30/15 1100  metroNIDAZOLE (FLAGYL) IVPB 500 mg  Status:  Discontinued     500 mg 100 mL/hr over 60 Minutes Intravenous Every 12 hours 04/30/15 0951 05/02/15 0751   04/30/15 1000  cefTRIAXone (ROCEPHIN) 1 g in dextrose 5 % 50 mL IVPB  Status:  Discontinued     1 g 100 mL/hr over 30 Minutes Intravenous Every 24 hours 04/30/15 0939 05/01/15 1205   04/30/15 0945  cefTRIAXone (ROCEPHIN) injection 1 g  Status:  Discontinued     1 g Intramuscular Every 24 hours 04/30/15 0930 04/30/15 0938   04/29/15 0200  imipenem-cilastatin (PRIMAXIN) 250 mg in sodium chloride 0.9 % 100 mL IVPB  Status:  Discontinued     250 mg 200 mL/hr over 30 Minutes Intravenous  Every 8 hours 04/28/15 1812 04/30/15 0930   04/28/15 1830  imipenem-cilastatin (PRIMAXIN) 250 mg in sodium chloride 0.9 % 100 mL IVPB     250 mg 200 mL/hr over 30 Minutes Intravenous  Once 04/28/15 1812 04/28/15 1904        Assessment/Plan Acute necrotizing pancreatitis with SIRS Gallstones -afebrile, no WBC today, but clinically looks improved.  sCr 1.4.  Given improvement and elevated sCr continue to hold off on repeating CT scan.  -may DC NGT and advance to fulls PCM-TPN  VTE prophylaxis-SCD/heparin ID- Meropenem D#8 for necrotizing pancreatitis, uti   Erby Pian, ANP-BC [facility] Pager [phone]([phone])   05/09/2015 8:16 AM

## 2015-05-09 NOTE — Progress Notes (Signed)
Date: May 09, 2015 Chart reviewed for concurrent status and case management needs. Will continue to follow patient for changes and needs:  Continues on IV Cardizem/discussed at the los meeting for continued stay. Marcelle Smilinghonda Davis, RN, BSN, [place]   [phone]

## 2015-05-09 NOTE — Progress Notes (Signed)
PULMONARY / CRITICAL CARE MEDICINE   Name: Victoria Morrow MRN: [id number] DOB: 1951/03/13    ADMISSION DATE:  04/28/2015 CONSULTATION DATE: 1/2  REFERRING MD:  Victoria Morrow  CHIEF COMPLAINT:  Hypotension and pancreatitis   BRIEF DESCRIPTION:   65 year old female with PMH DM II admitted on 1/1 with severe acute pancreatitis believed to be related to gallstones but no CBD or pancreatic duct dilation on admission.  Takes ACE inhibitor baseline.  Had mild gall bladder wall thickening on ultrasound with 5mm stone in neck of gallbladder.  Aspirated on 1/4, requiring intubation, extubated 1/5. Fever 1/5.   SIGNIFICANT EVENTS: 1/4 vomited, aspirated, intubated, shock 1/5 extubated 1/7 SVT requiring cardizem gtt 1/8 Vomiting - NG to LIS  SUBJECTIVE: Remains on cardizem gtt Afebrile Denies pain oob to chair    VITAL SIGNS: BP 136/71 mmHg  Pulse 112  Temp(Src) 98.8 F (37.1 C) (Oral)  Resp 38  Ht 5\' 2"  (1.575 m)  Wt 139 lb 5.3 oz (63.2 kg)  BMI 25.48 kg/m2  SpO2 100%  HEMODYNAMICS:    VENTILATOR SETTINGS:    INTAKE / OUTPUT: I/O last 3 completed shifts: In: 4283.3 [I.V.:1808.3; NG/GT:70; IV Piggyback:200] Out: 4870 [Urine:3520; Emesis/NG output:1350]  PHYSICAL EXAMINATION: General:  Awake, alert, conversant , mild resp distress HENT: NCAT, no JVD PULM: CTA B, normal effort CV: RRR, no mgr GI: Belly soft, distended, no tenderness MSK: normal bulk and tone Neuro: awake, alert, no distress  LABS:  BMET  Recent Labs Lab 05/07/15 0400 05/08/15 0440 05/09/15 0400  NA 151* 152* 151*  K 3.8 4.2 4.5  CL 123* 124* 124*  CO2 19* 19* 18*  BUN 28* 29* 28*  CREATININE 1.75* 1.42* 1.40*  GLUCOSE 227* 278* 282*    Electrolytes  Recent Labs Lab 05/07/15 0400 05/08/15 0440 05/09/15 0400  CALCIUM 9.1 9.2 10.3  MG 2.1 1.9 1.8  PHOS 2.6 3.1 3.1    CBC  Recent Labs Lab 05/06/15 0325 05/07/15 0400 05/08/15 0440  WBC 25.6* 26.0* 25.0*  HGB 9.2* 8.7* 7.9*  HCT  27.5* 25.8* 24.2*  PLT 498* 572* 545*    Coag's No results for input(s): APTT, INR in the last 168 hours.  Sepsis Markers No results for input(s): LATICACIDVEN, PROCALCITON, O2SATVEN in the last 168 hours.  ABG No results for input(s): PHART, PCO2ART, PO2ART in the last 168 hours.  Liver Enzymes  Recent Labs Lab 05/06/15 0325 05/09/15 0400  AST 32 48*  ALT 25 26  ALKPHOS 92 141*  BILITOT 0.8 0.8  ALBUMIN 2.1* 1.7*    Cardiac Enzymes No results for input(s): TROPONINI, PROBNP in the last 168 hours.  Glucose  Recent Labs Lab 05/08/15 1237 05/08/15 1729 05/08/15 2019 05/09/15 0004 05/09/15 0344 05/09/15 0803  GLUCAP 341* 319* 340* 228* 243* 235*    Imaging  STUDIES:  CT abd 1/1 1. Upper abdominal edema, eccentric right. Centered about the pancreatic head, uncinate process, and neck. Favor pancreatitis. Correlate with pancreatic enzymes (pending). If these are abnormal, less likely differential consideration would include peptic ulcer Disease. 2. Cholelithiasis without specific evidence of cholecystitis or biliary duct dilatation. 3. Trace perihepatic ascites, likely secondary. 4. Small hiatal hernia. 5. Pelvic floor laxity. 6. Renal atrophy. [place] abd 1/1: 1. Gallbladder wall thickening, with mild pericholecystic fluid.This is nonspecific in the presence of the patient's known pancreatitis. Stones noted within the gallbladder, with a 5 mm stone lodged at the neck of the gallbladder. Mild acute cholecystitis cannot be entirely excluded, but is considered less  likely. 2. No evidence for bile duct obstruction. The patient's apparent duodenal diverticulum is not well characterized on ultrasound. CT abdomen/pelvis 1/5 > necrotic pancreas most likely, limited by lack of contrast, no cholecystitis, small stones, prox small bowel dilation, likely ileus, increased bilateral atelectasis/effusions, some peritoneal fluid  1/9  CXR images personally reviewed> low lung volumes, bibasal  atelectasis, no significant airspace disease  CULTURES: BCX2 1/1>>> neg UC 1/1>>> Klebsiella, amp resistant BC x2 1/5 > ng Resp cx 1/5 >  C diff 1/6 > neg  ANTIBIOTICS: Imipenem 1/1>>> 1/3 Ceftriaxone 1/3 > 1/4 Flagyl 1/3 > 1/5 Meropenem 1/4 >  Oral Vanc 1/6> 1/8  LINES/TUBES: 1/4 ETT > 1/5 1/4 L IJ CVL >   DISCUSSION: 65 year old female admitted severe acute pancreatitis, presumably gallstone related but no obstructing stone seen on imaging aside from one in neck of gallbladder.  Aspirated on 1/4, requiring intubation and central line placement.  CT abdomen on 1/4 shows likely pancreatic necrosis. Extubated 1/5, diarrhea overnight.  ASSESSMENT / PLAN:  PULMONARY A: Acute respiratory failure with hypoxemia > resolved P:   Incentive spirometry / flutter valve Mobilize as able  PRN xopenex  CARDIOVASCULAR A:  Shock - off neo since 1/7 Transient Afib 1/6, 1/7 - now in s tach on cardizem, frequent PVC's / SVT PVC / Transient SVT  P:  dc cardizem gtt  Lopressor 5 8h & prn   RENAL A:   AKI, UO  improving Hyperchloremia  Hypernatremia  Hypocalcemia Hypokalemia P:   Change IVF to D5W Replete lytes I&O strict  GASTROINTESTINAL A:   Acute pancreatitis - likely necrosis ? etio  Gall bladder wall thickening, GB stone but neg Murphy's sign 1/3 Ileus - improved Diarrhea P:   Clears,dc NGT,  aspiration precautions Continue TNA per Pharmacy, appreciate assistance PRN zofran Cholecystectomy eventually per gen surgery once pt stabilized  Consider rpt CT at some point, at risk for pseudocyst  HEMATOLOGIC A:   Anemia Leucocytosis P:  Trend CBC Transfuse per ICU protocol  Victoria heparin for DVT prophylaxis  INFECTIOUS A:   SIRS due to pancreatitis  Klebiella UTI  at risk for C diff P:   Continue meropenem until 1/14    ENDOCRINE A:   Hyperglycemia P:   SSI Increase lantus to 30 units QD Insulin in TNA  NEUROLOGIC A:   Pain control Extreme  deconditioning P:   Fentanyl PRN PT consult   FAMILY  - Updates: husband updated intermittently  - Inter-disciplinary family meet or Palliative Care meeting due by:  NA  Summary - Etiology of acute pancreatitis not clear, recovering organ failure, but rising WBCs, on TNA Attempt to mobilize & advance PO  Victoria Morrow V. MD [phone]   05/09/2015, 10:09 AM

## 2015-05-10 LAB — BASIC METABOLIC PANEL
ANION GAP: 8 (ref 5–15)
BUN: 29 mg/dL — AB (ref 6–20)
CHLORIDE: 123 mmol/L — AB (ref 101–111)
CO2: 18 mmol/L — ABNORMAL LOW (ref 22–32)
Calcium: 10.3 mg/dL (ref 8.9–10.3)
Creatinine, Ser: 1.28 mg/dL — ABNORMAL HIGH (ref 0.44–1.00)
GFR calc Af Amer: 49 mL/min — ABNORMAL LOW (ref >60)
GFR, EST NON AFRICAN AMERICAN: 43 mL/min — AB (ref >60)
Glucose, Bld: 235 mg/dL — ABNORMAL HIGH (ref 65–99)
POTASSIUM: 4.5 mmol/L (ref 3.5–5.1)
SODIUM: 149 mmol/L — AB (ref 135–145)

## 2015-05-10 LAB — CBC
HEMATOCRIT: 24.1 % — AB (ref 36.0–46.0)
HEMOGLOBIN: 7.8 g/dL — AB (ref 12.0–15.0)
MCH: 28.2 pg (ref 26.0–34.0)
MCHC: 32.4 g/dL (ref 30.0–36.0)
MCV: 87 fL (ref 78.0–100.0)
Platelets: 556 10*3/uL — ABNORMAL HIGH (ref 150–400)
RBC: 2.77 MIL/uL — ABNORMAL LOW (ref 3.87–5.11)
RDW: 13 % (ref 11.5–15.5)
WBC: 20.1 10*3/uL — AB (ref 4.0–10.5)

## 2015-05-10 LAB — GLUCOSE, CAPILLARY
GLUCOSE-CAPILLARY: 204 mg/dL — AB (ref 65–99)
Glucose-Capillary: 218 mg/dL — ABNORMAL HIGH (ref 65–99)
Glucose-Capillary: 237 mg/dL — ABNORMAL HIGH (ref 65–99)
Glucose-Capillary: 220 mg/dL — ABNORMAL HIGH (ref 65–99)
Glucose-Capillary: 196 mg/dL — ABNORMAL HIGH (ref 65–99)

## 2015-05-10 LAB — MAGNESIUM: Magnesium: 1.7 mg/dL (ref 1.7–2.4)

## 2015-05-10 LAB — PHOSPHORUS: PHOSPHORUS: 3.3 mg/dL (ref 2.5–4.6)

## 2015-05-10 MED ORDER — FAT EMULSION 20 % IV EMUL
192.0000 mL | INTRAVENOUS | Status: AC
  Administered 2015-05-10: 192 mL via INTRAVENOUS
  Filled 2015-05-10: qty 250

## 2015-05-10 MED ORDER — INSULIN GLARGINE 100 UNIT/ML ~~LOC~~ SOLN
45.0000 [IU] | Freq: Every day | SUBCUTANEOUS | Status: DC
  Administered 2015-05-10: 45 [IU] via SUBCUTANEOUS
  Filled 2015-05-10 (×2): qty 0.45

## 2015-05-10 MED ORDER — TRACE MINERALS CR-CU-MN-SE-ZN 10-1000-500-60 MCG/ML IV SOLN
INTRAVENOUS | Status: AC
  Administered 2015-05-10: 18:00:00 via INTRAVENOUS
  Filled 2015-05-10: qty 1320

## 2015-05-10 NOTE — Progress Notes (Signed)
Patient ID: Victoria Morrow, female   DOB: 02/09/1951, 65 y.o.   MRN: [id number]     [address] [place], [place] [address]    Phone: [phone] FAX: [phone]     Subjective: Denies pain, but per nursing complained of "all over pain."   No n/v.  Tolerating clears.  WBC down, afebrile.   Objective:  Vital signs:  Filed Vitals:   05/10/15 0300 05/10/15 0400 05/10/15 0500 05/10/15 0600  BP: 119/71 121/61 130/80 147/72  Pulse:      Temp:  97.8 F (36.6 C)    TempSrc:  Oral    Resp: 30 27 22 24   Height:      Weight:      SpO2:        Last BM Date: 05/10/15  Intake/Output   Yesterday:  01/12 0701 - 01/13 0700 In: 1732.5 [P.O.:540; I.V.:827.5; IV Piggyback:250; TPN:115] Out: 2175 [Urine:2175] This shift:    Physical Exam: General: Pt awake/alert/oriented x4 in no acute distress  Abdomen: +BS, abdomen is soft, non tender.    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[place])    Results:   Labs: Results for orders placed or performed during the hospital encounter of 04/28/15 (from the past 48 hour(s))  Glucose, capillary     Status: Abnormal   Collection Time: 05/08/15 12:37 PM  Result Value Ref Range   Glucose-Capillary 341 (H) 65 - 99 mg/dL   Comment 1 Notify RN    Comment 2 Document in Chart   Glucose, capillary     Status: Abnormal   Collection Time: 05/08/15  5:29 PM  Result Value Ref Range   Glucose-Capillary 319 (H) 65 -  99 mg/dL  Glucose, capillary     Status: Abnormal   Collection Time: 05/08/15  8:19 PM  Result Value Ref Range   Glucose-Capillary 340 (H) 65 - 99 mg/dL   Comment 1 Notify RN   Glucose, capillary     Status: Abnormal   Collection Time: 05/09/15 12:04 AM  Result Value Ref Range   Glucose-Capillary 228 (H) 65 - 99 mg/dL   Comment 1 Notify RN   Glucose, capillary     Status: Abnormal   Collection Time: 05/09/15  3:44 AM  Result Value Ref Range   Glucose-Capillary 243 (H) 65 - 99 mg/dL  Comprehensive metabolic panel     Status: Abnormal   Collection Time: 05/09/15  4:00 AM  Result Value Ref Range   Sodium 151 (H) 135 - 145 mmol/L   Potassium 4.5 3.5 - 5.1 mmol/L   Chloride 124 (H) 101 - 111 mmol/L   CO2 18 (L) 22 - 32 mmol/L   Glucose, Bld 282 (H) 65 - 99 mg/dL   BUN 28 (H) 6 - 20 mg/dL   Creatinine, Ser 1.40 (H) 0.44 - 1.00 mg/dL   Calcium 10.3 8.9 - 10.3 mg/dL   Total Protein 5.7 (L) 6.5 - 8.1 g/dL  Albumin 1.7 (L) 3.5 - 5.0 g/dL   AST 48 (H) 15 - 41 U/L   ALT 26 14 - 54 U/L   Alkaline Phosphatase 141 (H) 38 - 126 U/L   Total Bilirubin 0.8 0.3 - 1.2 mg/dL   GFR calc non Af Amer 38 (L) >60 mL/min   GFR calc Af Amer 44 (L) >60 mL/min    Comment: (NOTE) The eGFR has been calculated using the CKD EPI equation. This calculation has not been validated in all clinical situations. eGFR's persistently <60 mL/min signify possible Chronic Kidney Disease.    Anion gap 9 5 - 15  Magnesium     Status: None   Collection Time: 05/09/15  4:00 AM  Result Value Ref Range   Magnesium 1.8 1.7 - 2.4 mg/dL  Phosphorus     Status: None   Collection Time: 05/09/15  4:00 AM  Result Value Ref Range   Phosphorus 3.1 2.5 - 4.6 mg/dL  Glucose, capillary     Status: Abnormal   Collection Time: 05/09/15  8:03 AM  Result Value Ref Range   Glucose-Capillary 235 (H) 65 - 99 mg/dL  Glucose, capillary     Status: Abnormal   Collection Time: 05/09/15 12:07 PM  Result Value Ref Range    Glucose-Capillary 269 (H) 65 - 99 mg/dL   Comment 1 Notify RN    Comment 2 Document in Chart   Glucose, capillary     Status: Abnormal   Collection Time: 05/09/15  4:36 PM  Result Value Ref Range   Glucose-Capillary 249 (H) 65 - 99 mg/dL  Glucose, capillary     Status: Abnormal   Collection Time: 05/09/15  7:38 PM  Result Value Ref Range   Glucose-Capillary 290 (H) 65 - 99 mg/dL  Glucose, capillary     Status: Abnormal   Collection Time: 05/09/15 11:56 PM  Result Value Ref Range   Glucose-Capillary 236 (H) 65 - 99 mg/dL  Glucose, capillary     Status: Abnormal   Collection Time: 05/10/15  4:11 AM  Result Value Ref Range   Glucose-Capillary 204 (H) 65 - 99 mg/dL  Basic metabolic panel     Status: Abnormal   Collection Time: 05/10/15  4:12 AM  Result Value Ref Range   Sodium 149 (H) 135 - 145 mmol/L   Potassium 4.5 3.5 - 5.1 mmol/L   Chloride 123 (H) 101 - 111 mmol/L   CO2 18 (L) 22 - 32 mmol/L   Glucose, Bld 235 (H) 65 - 99 mg/dL   BUN 29 (H) 6 - 20 mg/dL   Creatinine, Ser 1.28 (H) 0.44 - 1.00 mg/dL   Calcium 10.3 8.9 - 10.3 mg/dL   GFR calc non Af Amer 43 (L) >60 mL/min   GFR calc Af Amer 49 (L) >60 mL/min    Comment: (NOTE) The eGFR has been calculated using the CKD EPI equation. This calculation has not been validated in all clinical situations. eGFR's persistently <60 mL/min signify possible Chronic Kidney Disease.    Anion gap 8 5 - 15  CBC     Status: Abnormal   Collection Time: 05/10/15  4:12 AM  Result Value Ref Range   WBC 20.1 (H) 4.0 - 10.5 K/uL   RBC 2.77 (L) 3.87 - 5.11 MIL/uL   Hemoglobin 7.8 (L) 12.0 - 15.0 g/dL   HCT 24.1 (L) 36.0 - 46.0 %   MCV 87.0 78.0 - 100.0 fL   MCH 28.2 26.0 - 34.0 pg   MCHC  32.4 30.0 - 36.0 g/dL   RDW 13.0 11.5 - 15.5 %   Platelets 556 (H) 150 - 400 K/uL  Magnesium     Status: None   Collection Time: 05/10/15  4:12 AM  Result Value Ref Range   Magnesium 1.7 1.7 - 2.4 mg/dL  Phosphorus     Status: None   Collection Time:  05/10/15  4:12 AM  Result Value Ref Range   Phosphorus 3.3 2.5 - 4.6 mg/dL    Imaging / Studies: Dg Chest Port 1 View  05/09/2015  CLINICAL DATA:  Respiratory failure. EXAM: PORTABLE CHEST 1 VIEW COMPARISON:  05/06/2015. FINDINGS: Right PICC line and NG tube in good anatomic position. Low lung volumes with bibasilar and/or infiltrates left side greater than right again noted. No prominent pleural effusion. No pneumothorax. IMPRESSION: 1. Right PICC line and NG tube in good anatomic position. 2. Lung volumes with persistent bibasilar atelectasis and/or infiltrates, left side greater than right. No significant interim change. Electronically Signed   By: Marcello Moores  Register   On: 05/09/2015 07:15    Medications / Allergies:  Scheduled Meds: . antiseptic oral rinse  7 mL Mouth Rinse q12n4p  . aspirin  81 mg Oral Daily  . chlorhexidine  15 mL Mouth Rinse BID  . famotidine (PEPCID) IV  20 mg Intravenous Q24H  . heparin  5,000 Units Subcutaneous 3 times per day  . insulin aspart  0-20 Units Subcutaneous 6 times per day  . insulin glargine  30 Units Subcutaneous Daily  . meropenem (MERREM) IV  1 g Intravenous Q12H  . metoprolol  5 mg Intravenous 3 times per day  . sodium chloride  10-40 mL Intracatheter Q12H  . sodium chloride  3 mL Intravenous Q12H   Continuous Infusions: . dextrose 50 mL/hr at 05/10/15 0600  . TPN (CLINIMIX) Adult without lytes 55 mL/hr at 05/09/15 1741   And  . fat emulsion 120 mL (05/09/15 1741)   PRN Meds:.acetaminophen **OR** acetaminophen, bismuth subsalicylate, diphenhydrAMINE, fentaNYL (SUBLIMAZE) injection, hydrALAZINE, levalbuterol, metoprolol, ondansetron (ZOFRAN) IV **OR** ondansetron (ZOFRAN) IV, promethazine, sodium chloride  Antibiotics: Anti-infectives    Start     Dose/Rate Route Frequency Ordered Stop   05/08/15 2200  meropenem (MERREM) 1 g in sodium chloride 0.9 % 100 mL IVPB     1 g 200 mL/hr over 30 Minutes Intravenous Every 12 hours 05/08/15 1054      05/03/15 1200  vancomycin (VANCOCIN) 50 mg/mL oral solution 500 mg  Status:  Discontinued     500 mg Oral 4 times per day 05/03/15 0917 05/04/15 1306   05/02/15 1200  cefTRIAXone (ROCEPHIN) 1 g in dextrose 5 % 50 mL IVPB  Status:  Discontinued     1 g 100 mL/hr over 30 Minutes Intravenous Every 24 hours 05/01/15 1448 05/01/15 2004   05/02/15 0600  imipenem-cilastatin (PRIMAXIN) 250 mg in sodium chloride 0.9 % 100 mL IVPB  Status:  Discontinued     250 mg 200 mL/hr over 30 Minutes Intravenous 3 times per day 05/01/15 2025 05/02/15 0027   05/02/15 0030  meropenem (MERREM) 500 mg in sodium chloride 0.9 % 50 mL IVPB  Status:  Discontinued     500 mg 100 mL/hr over 30 Minutes Intravenous Every 12 hours 05/02/15 0027 05/08/15 1054   05/01/15 2030  imipenem-cilastatin (PRIMAXIN) 250 mg in sodium chloride 0.9 % 100 mL IVPB     250 mg 200 mL/hr over 30 Minutes Intravenous NOW 05/01/15 2025 05/01/15 2110   04/30/15  1100  metroNIDAZOLE (FLAGYL) IVPB 500 mg  Status:  Discontinued     500 mg 100 mL/hr over 60 Minutes Intravenous Every 12 hours 04/30/15 0951 05/02/15 0751   04/30/15 1000  cefTRIAXone (ROCEPHIN) 1 g in dextrose 5 % 50 mL IVPB  Status:  Discontinued     1 g 100 mL/hr over 30 Minutes Intravenous Every 24 hours 04/30/15 0939 05/01/15 1205   04/30/15 0945  cefTRIAXone (ROCEPHIN) injection 1 g  Status:  Discontinued     1 g Intramuscular Every 24 hours 04/30/15 0930 04/30/15 0938   04/29/15 0200  imipenem-cilastatin (PRIMAXIN) 250 mg in sodium chloride 0.9 % 100 mL IVPB  Status:  Discontinued     250 mg 200 mL/hr over 30 Minutes Intravenous Every 8 hours 04/28/15 1812 04/30/15 0930   04/28/15 1830  imipenem-cilastatin (PRIMAXIN) 250 mg in sodium chloride 0.9 % 100 mL IVPB     250 mg 200 mL/hr over 30 Minutes Intravenous  Once 04/28/15 1812 04/28/15 1904        Assessment/Plan Acute necrotizing pancreatitis with SIRS Gallstones -afebrile, WBC trending down, slowing improving.  sCr  1.28. Given improvement and elevated sCr continue to hold off on repeating CT scan.  -may DC NGT and continue with liquids.  PCM-TPN  VTE prophylaxis-SCD/heparin ID- Meropenem D#9 for necrotizing pancreatitis    Erby Pian, ANP-BC [facility] Pager [phone]([id number]:30P)   05/10/2015 7:59 AM

## 2015-05-10 NOTE — Progress Notes (Signed)
Pharmacy Antibiotic Follow-up Note  Victoria Morrow is a 65 y.o. year-old female admitted on 04/28/2015.  The patient is currently on day #9 of meropenem (Day #12 of antibiotics) for necrotizing pancreatitis (UTI treated). Plan per PCCM is total of 14-days of antibiotics.   Assessment/Plan: Renal function continues to improve, WBC with some improvement, afebrile > 48 hours. Continue meropenem 1gm IV q12h for CrCl 30-50 ml/min.  Temp (24hrs), Avg:98.5 F (36.9 C), Min:97.8 F (36.6 C), Max:100.1 F (37.8 C)   Recent Labs Lab 05/05/15 0344 05/06/15 0325 05/07/15 0400 05/08/15 0440 05/10/15 0412  WBC 22.0* 25.6* 26.0* 25.0* 20.1*     Recent Labs Lab 05/06/15 0325 05/07/15 0400 05/08/15 0440 05/09/15 0400 05/10/15 0412  CREATININE 1.79* 1.75* 1.42* 1.40* 1.28*   Estimated Creatinine Clearance: 37.7 mL/min (by C-G formula based on Cr of 1.28).    No Known Allergies  Antimicrobials this admission: 1/1 >> Primaxin >> 1/3 1/3 >> Rocephin >> 1/4 1/3 >> Flagyl >> 1/5 1/4 >> Primaxin >> 1/5 1/5 >> meropenem >> 1/6 >> PO vanc >> 1/7  Microbiology Results: 1/1 BCx: NGF 1/1 MRSA PCR: neg 1/2 urine: >100k Klebsiella pneumo (R-amp) 1/5 blood: 1/2 NGF, other canceled 1/5 trach asp: NGF 1/6 C diff: neg  Levels/dose changes this admission: 1/11 change meropenem to 1gm IV q12h for improved renal function  Thank you for allowing pharmacy to be a part of this patient's care.  Clance BollAmanda Climmie Buelow, PharmD, BCPS Pager: [phone] 05/10/2015 9:26 AM

## 2015-05-10 NOTE — Progress Notes (Signed)
PULMONARY / CRITICAL CARE MEDICINE   Name: Victoria Morrow MRN: [id number] DOB: 1950-08-31    ADMISSION DATE:  04/28/2015 CONSULTATION DATE: 1/2  REFERRING MD:  Rito Ehrlich  CHIEF COMPLAINT:  Hypotension and pancreatitis   BRIEF DESCRIPTION:   65 year old female with PMH DM II admitted on 1/1 with severe acute pancreatitis believed to be related to gallstones but no CBD or pancreatic duct dilation on admission.  Takes ACE inhibitor baseline.  Had mild gall bladder wall thickening on ultrasound with 5mm stone in neck of gallbladder.  Aspirated on 1/4, requiring intubation, extubated 1/5. Fever 1/5.   SIGNIFICANT EVENTS: 1/4 vomited, aspirated, intubated, shock 1/5 extubated 1/7 SVT requiring cardizem gtt 1/8 Vomiting - NG to LIS 1/12 off cardizem gtt  SUBJECTIVE:  Afebrile C/o mild abd pain Tolerating clears   VITAL SIGNS: BP 147/72 mmHg  Pulse 31  Temp(Src) 97.9 F (36.6 C) (Oral)  Resp 24  Ht 5\' 2"  (1.575 m)  Wt 139 lb 5.3 oz (63.2 kg)  BMI 25.48 kg/m2  SpO2 100%  HEMODYNAMICS:    VENTILATOR SETTINGS:    INTAKE / OUTPUT: I/O last 3 completed shifts: In: 3377.5 [P.O.:540; I.V.:1592.5; NG/GT:60; IV Piggyback:350] Out: 3395 [Urine:3395]  PHYSICAL EXAMINATION: General:  Awake, alert, conversant , mild resp distress HENT: NCAT, no JVD PULM: CTA B, normal effort CV: RRR, no mgr GI: Belly soft, distended, no tenderness MSK: normal bulk and tone Neuro: awake, alert, no distress  LABS:  BMET  Recent Labs Lab 05/08/15 0440 05/09/15 0400 05/10/15 0412  NA 152* 151* 149*  K 4.2 4.5 4.5  CL 124* 124* 123*  CO2 19* 18* 18*  BUN 29* 28* 29*  CREATININE 1.42* 1.40* 1.28*  GLUCOSE 278* 282* 235*    Electrolytes  Recent Labs Lab 05/08/15 0440 05/09/15 0400 05/10/15 0412  CALCIUM 9.2 10.3 10.3  MG 1.9 1.8 1.7  PHOS 3.1 3.1 3.3    CBC  Recent Labs Lab 05/07/15 0400 05/08/15 0440 05/10/15 0412  WBC 26.0* 25.0* 20.1*  HGB 8.7* 7.9* 7.8*  HCT  25.8* 24.2* 24.1*  PLT 572* 545* 556*    Coag's No results for input(s): APTT, INR in the last 168 hours.  Sepsis Markers No results for input(s): LATICACIDVEN, PROCALCITON, O2SATVEN in the last 168 hours.  ABG No results for input(s): PHART, PCO2ART, PO2ART in the last 168 hours.  Liver Enzymes  Recent Labs Lab 05/06/15 0325 05/09/15 0400  AST 32 48*  ALT 25 26  ALKPHOS 92 141*  BILITOT 0.8 0.8  ALBUMIN 2.1* 1.7*    Cardiac Enzymes No results for input(s): TROPONINI, PROBNP in the last 168 hours.  Glucose  Recent Labs Lab 05/09/15 1207 05/09/15 1636 05/09/15 1938 05/09/15 2356 05/10/15 0411 05/10/15 0726  GLUCAP 269* 249* 290* 236* 204* 218*    Imaging  STUDIES:  CT abd 1/1 1. Upper abdominal edema, eccentric right. Centered about the pancreatic head, uncinate process, and neck. Favor pancreatitis. Correlate with pancreatic enzymes (pending). If these are abnormal, less likely differential consideration would include peptic ulcer Disease. 2. Cholelithiasis without specific evidence of cholecystitis or biliary duct dilatation. 3. Trace perihepatic ascites, likely secondary. 4. Small hiatal hernia. 5. Pelvic floor laxity. 6. Renal atrophy. [place] abd 1/1: 1. Gallbladder wall thickening, with mild pericholecystic fluid.This is nonspecific in the presence of the patient's known pancreatitis. Stones noted within the gallbladder, with a 5 mm stone lodged at the neck of the gallbladder. Mild acute cholecystitis cannot be entirely excluded, but is considered less  likely. 2. No evidence for bile duct obstruction. The patient's apparent duodenal diverticulum is not well characterized on ultrasound. CT abdomen/pelvis 1/5 > necrotic pancreas most likely, limited by lack of contrast, no cholecystitis, small stones, prox small bowel dilation, likely ileus, increased bilateral atelectasis/effusions, some peritoneal fluid  1/9  CXR images personally reviewed> low lung volumes, bibasal  atelectasis, no significant airspace disease  CULTURES: BCX2 1/1>>> neg UC 1/1>>> Klebsiella, amp resistant BC x2 1/5 > ng Resp cx 1/5 >  C diff 1/6 > neg  ANTIBIOTICS: Imipenem 1/1>>> 1/3 Ceftriaxone 1/3 > 1/4 Flagyl 1/3 > 1/5 Meropenem 1/4 >  Oral Vanc 1/6> 1/8  LINES/TUBES: 1/4 ETT > 1/5 1/4 L IJ CVL >   DISCUSSION: 65 year old female admitted severe acute pancreatitis, presumably gallstone related but no obstructing stone seen on imaging aside from one in neck of gallbladder.  Aspirated on 1/4, requiring intubation and central line placement.  CT abdomen on 1/4 shows likely pancreatic necrosis. Extubated 1/5, diarrhea overnight.  ASSESSMENT / PLAN:  PULMONARY A: Acute respiratory failure with hypoxemia > resolved P:   Incentive spirometry / flutter valve Mobilize as able  PRN xopenex  CARDIOVASCULAR A:  Shock - off neo since 1/7 Transient Afib 1/6, 1/7 - now in s tach on cardizem, frequent PVC's / SVT PVC / Transient SVT  P:   Lopressor 5 q 8h & prn   RENAL A:   AKI, UO  improving Hyperchloremia  Hypernatremia  Hypocalcemia Hypokalemia P:   Ct  D5W Replete lytes I&O strict  GASTROINTESTINAL A:   Acute pancreatitis - likely necrosis ? etio  Gall bladder wall thickening, GB stone but neg Murphy's sign 1/3 Ileus - improved Diarrhea P:   Clears,dc NGT,  aspiration precautions Continue TNA per Pharmacy, appreciate assistance PRN zofran Cholecystectomy eventually per gen surgery once pt stabilized  Consider rpt CT at some point once cr normalised, at risk for pseudocyst  HEMATOLOGIC A:   Anemia Leucocytosis P:  Trend CBC Transfuse per ICU protocol  [place] heparin for DVT prophylaxis  INFECTIOUS A:   SIRS due to pancreatitis  Klebiella UTI  at risk for C diff P:   Continue meropenem until 1/14    ENDOCRINE A:   Hyperglycemia P:   SSI Increased lantus to 30 units QD Insulin in TNA  NEUROLOGIC A:   Pain control Extreme  deconditioning P:   Fentanyl PRN PT consult   FAMILY  - Updates: husband updated intermittently  - Inter-disciplinary family meet or Palliative Care meeting due by:  NA  Summary - Etiology of acute pancreatitis not clear, recovering organ failure, WBCs finally coming down , on TNA Attempt to mobilize & advance PO  Can transfer to triad 1/ 14  ALVA,RAKESH V. MD [phone]   05/10/2015, 9:51 AM

## 2015-05-10 NOTE — Progress Notes (Signed)
PARENTERAL NUTRITION CONSULT NOTE - FOLLOW-UP  Pharmacy Consult for TPN Indication: severe necrotizing pancreatitis with ileus  No Known Allergies  Patient Measurements: Height: 5' 2"  (157.5 cm) Weight: 139 lb 5.3 oz (63.2 kg) IBW/kg (Calculated) : 50.1   Vital Signs: Temp: 97.8 F (36.6 C) (01/13 0400) Temp Source: Oral (01/13 0400) BP: 147/72 mmHg (01/13 0600) Intake/Output from previous day: 01/12 0701 - 01/13 0700 In: 1732.5 [P.O.:540; I.V.:827.5; IV Piggyback:250; TPN:115] Out: 2175 [Urine:2175]    Labs:  Recent Labs  05/08/15 0440 05/10/15 0412  WBC 25.0* 20.1*  HGB 7.9* 7.8*  HCT 24.2* 24.1*  PLT 545* 556*     Recent Labs  05/08/15 0440 05/09/15 0400 05/10/15 0412  NA 152* 151* 149*  K 4.2 4.5 4.5  CL 124* 124* 123*  CO2 19* 18* 18*  GLUCOSE 278* 282* 235*  BUN 29* 28* 29*  CREATININE 1.42* 1.40* 1.28*  CALCIUM 9.2 10.3 10.3  MG 1.9 1.8 1.7  PHOS 3.1 3.1 3.3  PROT  --  5.7*  --   ALBUMIN  --  1.7*  --   AST  --  48*  --   ALT  --  26  --   ALKPHOS  --  141*  --   BILITOT  --  0.8  --    Estimated Creatinine Clearance: 37.7 mL/min (by C-G formula based on Cr of 1.28).    Recent Labs  05/09/15 1938 05/09/15 2356 05/10/15 0411  GLUCAP 290* 236* 204*    Medical History: Past Medical History  Diagnosis Date  . Olivopontocerebellar atrophy (HCC)     Mgmt Dr Brett Fairy  . Diabetes mellitus     Type II. Insulin dependent. Diabetic nephropathy.    Insulin Requirements:  Currently on lantus 30 units daily,  resistant SSI q4h using 50 units/24h, and 80 units of regular insulin added to TPN  Current Nutrition: Clear liquid diet  IVF: D5W at 49m/hr  Central access: PICC per IR TPN start date: 1/9  ASSESSMENT                                                                                                          HPI:  [id number] presents with N/V and abdominal pain on 1/1, found to have gallstone pancreatitis.  CT revealing necrotizing  pancreatitis. Surgery note form 1/8 states severe abdominal distention and ileus.  Pharmacy asked to start TPN on 1/7 but PICC has not been able to be placed by IV team.  Plans for IR to place PICC. Of note, she has DM on lantus and metformin prior to admission as well as hyperlipidemia on statin therapy.   Significant events:   05/09/2015 - Stool occurrence x2  1/13 CCS trying to d/c NG tube, currently still in place and clamped. Remains on CLD.  Today:   Glucose - in last 24 hours on TPN, CBG x6 above goal of < 1539mdl. Lantus dose was increased to 30 units daily on 1/12 but was not given until 1/13.  Possible patient's CLQ diet contributing to increasing  CBGs.  Electrolytes - Na and Cl elevated, HCO3 decreased; Corr Ca increased to 12.14 (Ca x Phos < 55), Mg, phos WNL  Renal - AKI improving, UOP/24h = 1.41m/kg/hr, -0.19L/24h  LFTs - AST elevated at 48, ALT WNL, alk phos elevated (1/12)  TGs - 139 (1/9)  Prealbumin - 6.9 (1/9)  NUTRITIONAL GOALS                                                                                           RD recs: Kcal: 1800-2000 Protein: 85-95 grams Fluid: 2-2.2 L/day  Clinimix E 5/15 at a goal rate of 75 ml/hr + 20% fat emulsion at 10 ml/hr to provide: 90 g/day protein, 1758 Kcal/day. (could consider increase to 838mhr if tolerates)  PLAN                                                                                                                         At 1800 today:  Continue Clinimix 5/15 at 5545mr (remains below goal).  Keep electrolytes removed due to hypercalcemia   Will not advance due to hyperglycemia  TPN will be hypotonic so should have little to no contribution to hypernatremia.  20% fat emulsion - will increase to 8 ml/hr today to provide more non-dextrose calories  Currently no signs of refeeding syndrome.  Increase insulin in TPN so the patient receives 85 units of regular insulin over 24 hour TPN infusion.  This  equates to max concentration of insulin that we can place in the Clinimix (65 units/L).   Increase Lantus to 45 units daily.  Plan to advance as tolerated to the goal rate - CBGs preventing rapid advancement of TPN.  TPN to contain standard multivitamins and trace elements.  Continue IVF D5W at 83m98m per MD.  Continue resistant SSI q4h.  Keep famotidine outside TPN at this time  TPN lab panels on Mondays & Thursdays.  F/u daily.  BMET in am.  AmanCarolin CoyarmD Candidate 05/10/2015 7:57 AM   I agree with the student's assessment and plan above. AmanHershal CoriaarmD Pager: [phone]

## 2015-05-10 NOTE — NC FL2 (Deleted)
[place] MEDICAID FL2 LEVEL OF CARE SCREENING TOOL     IDENTIFICATION  Patient Name: Victoria Morrow Birthdate: 1951-01-06 Sex: female Admission Date (Current Location): 04/28/2015  [facility] [place]   Facility and Address:  [facility],  [address], [place] [id number]      Provider Number: [id number]  Attending Physician Name and Address:  Lupita Leashouglas B McQuaid, MD  Relative Name and Phone Number:  Amado CoeDavid Sutton, spouse, [phone]    Current Level of Care: Hospital Recommended Level of Care: Skilled Nursing Facility Prior Approval Number:    Date Approved/Denied:   PASRR Number:    Discharge Plan: SNF    Current Diagnoses: Patient Active Problem List   Diagnosis Date Noted  . Olivopontocerebellar atrophy (HCC)   . Protein-calorie malnutrition, moderate (HCC) 05/04/2015  . Acute necrotizing pancreatitis with SIRS 05/04/2015  . Umbilical hernia 05/04/2015  . Acute respiratory failure with hypoxia (HCC)   . Aspiration pneumonia due to vomit (HCC)   . Acute on chronic respiratory failure (HCC)   . Gallstones 04/28/2015  . Transaminitis 04/28/2015  . DM type 2 (diabetes mellitus, type 2) (HCC) 04/28/2015  . Hyperlipidemia 04/28/2015  . ARF (acute renal failure) (HCC) 04/28/2015  . Hypertensive retinopathy 09/27/2013  . Chronic kidney disease (CKD), stage III (moderate) 09/05/2010  . Type 2 diabetes mellitus with diabetic nephropathy (HCC) 09/04/2010  . Olivopontocerebellar degeneration (HCC) 07/10/2010  . Anemia in chronic illness 07/10/2010  . UNSPECIFIED ANEMIA 04/11/2009  . OSTEOPENIA 04/04/2009  . DEPRESSION 06/08/2008  . INSOMNIA 02/16/2008  . HYPERCALCEMIA 10/04/2006  . 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 HEMOCCULT POSITIVE STOOL 06/03/2006  . PROTEINURIA 06/03/2006    Orientation RESPIRATION BLADDER Height & Weight    Self, Place  Normal Indwelling catheter   139 lbs.  BEHAVIORAL SYMPTOMS/MOOD  NEUROLOGICAL BOWEL NUTRITION STATUS      Incontinent TNA  AMBULATORY STATUS COMMUNICATION OF NEEDS Skin   Extensive Assist Verbally Normal                       Personal Care Assistance Level of Assistance  Bathing, Feeding, Dressing Bathing Assistance: Maximum assistance Feeding assistance: Maximum assistance Dressing Assistance: Maximum assistance     Functional Limitations Info             SPECIAL CARE FACTORS FREQUENCY  PT (By licensed PT)     PT Frequency: Min 3x/week              Contractures      Additional Factors Info  Insulin Sliding Scale               Current Medications (05/10/2015):  This is the current hospital active medication list Current Facility-Administered Medications  Medication Dose Route Frequency Provider Last Rate Last Dose  . acetaminophen (TYLENOL) tablet 650 mg  650 mg Oral Q6H PRN Osvaldo ShipperGokul Krishnan, MD   650 mg at 05/09/15 2118   Or  . acetaminophen (TYLENOL) suppository 650 mg  650 mg Rectal Q6H PRN Osvaldo ShipperGokul Krishnan, MD      . antiseptic oral rinse (CPC / CETYLPYRIDINIUM CHLORIDE 0.05%) solution 7 mL  7 mL Mouth Rinse q12n4p Lupita Leashouglas B McQuaid, MD   7 mL at 05/10/15 1255  . aspirin chewable tablet 81 mg  81 mg Oral Daily Karie SodaSteven Gross, MD   81 mg at 05/08/15 1029  . bismuth subsalicylate (PEPTO BISMOL) 262 MG/15ML suspension 30 mL  30 mL Oral Q8H PRN Karie SodaSteven Gross,  MD      . chlorhexidine (PERIDEX) 0.12 % solution 15 mL  15 mL Mouth Rinse BID Lupita Leash, MD   15 mL at 05/10/15 0956  . dextrose 5 % solution   Intravenous Continuous Cyril Mourning V, MD 50 mL/hr at 05/10/15 0600    . diphenhydrAMINE (BENADRYL) injection 12.5-25 mg  12.5-25 mg Intravenous Q6H PRN Karie Soda, MD   12.5 mg at 05/04/15 2110  . famotidine (PEPCID) IVPB 20 mg premix  20 mg Intravenous Q24H Lupita Leash, MD   20 mg at 05/10/15 0957  . TPN (CLINIMIX) Adult without lytes   Intravenous Continuous TPN Aleda Grana, RPH 55 mL/hr at 05/09/15 1741      And  . fat emulsion 20 % infusion 120 mL  120 mL Intravenous Continuous TPN Aleda Grana, RPH 5 mL/hr at 05/09/15 1741 120 mL at 05/09/15 1741  . TPN (CLINIMIX) Adult without lytes   Intravenous Continuous TPN Maryanna Shape Runyon, RPH       And  . fat emulsion 20 % infusion 192 mL  192 mL Intravenous Continuous TPN Teressa Lower, RPH      . fentaNYL (SUBLIMAZE) injection 25-50 mcg  25-50 mcg Intravenous Q2H PRN Oretha Milch, MD   50 mcg at 05/08/15 1807  . heparin injection 5,000 Units  5,000 Units Subcutaneous 3 times per day Osvaldo Shipper, MD   5,000 Units at 05/10/15 1355  . hydrALAZINE (APRESOLINE) injection 20 mg  20 mg Intravenous Q4H PRN Shane Crutch, MD      . insulin aspart (novoLOG) injection 0-20 Units  0-20 Units Subcutaneous 6 times per day Osvaldo Shipper, MD   7 Units at 05/10/15 1354  . insulin glargine (LANTUS) injection 45 Units  45 Units Subcutaneous Daily Teressa Lower, RPH   45 Units at 05/10/15 0957  . levalbuterol (XOPENEX) nebulizer solution 0.63 mg  0.63 mg Nebulization Q6H PRN Cyril Mourning V, MD      . meropenem (MERREM) 1 g in sodium chloride 0.9 % 100 mL IVPB  1 g Intravenous Q12H Aleda Grana, RPH   1 g at 05/10/15 0956  . metoprolol (LOPRESSOR) injection 2.5-5 mg  2.5-5 mg Intravenous Q3H PRN Cyril Mourning V, MD      . metoprolol (LOPRESSOR) injection 5 mg  5 mg Intravenous 3 times per day Oretha Milch, MD   5 mg at 05/10/15 1355  . ondansetron (ZOFRAN) injection 4 mg  4 mg Intravenous Q6H PRN Karie Soda, MD       Or  . ondansetron (ZOFRAN) 8 mg in sodium chloride 0.9 % 50 mL IVPB  8 mg Intravenous Q6H PRN Karie Soda, MD      . promethazine (PHENERGAN) injection 6.25-12.5 mg  6.25-12.5 mg Intravenous Q4H PRN Karie Soda, MD      . sodium chloride 0.9 % injection 10-40 mL  10-40 mL Intracatheter Q12H Lupita Leash, MD   10 mL at 05/09/15 1430  . sodium chloride 0.9 % injection 10-40 mL  10-40 mL Intracatheter PRN Lupita Leash, MD      .  sodium chloride 0.9 % injection 3 mL  3 mL Intravenous Q12H Osvaldo Shipper, MD   3 mL at 05/10/15 1610     Discharge Medications: Please see discharge summary for a list of discharge medications.  Relevant Imaging Results:  Relevant Lab Results:   Additional Information SSN: [id number]  [place], [place]

## 2015-05-10 NOTE — Progress Notes (Signed)
PT Cancellation Note  Patient Details Name: Victoria Morrow MRN: [id number] DOB: 23-Dec-1950   Cancelled Treatment:     Unable to tolerate today stated RN.   Armando ReichertKropski, Aeneas Longsworth Anayla 05/10/2015, 2:48 PM

## 2015-05-11 ENCOUNTER — Inpatient Hospital Stay (HOSPITAL_COMMUNITY): Payer: Commercial Managed Care - HMO

## 2015-05-11 LAB — CBC
HEMATOCRIT: 24.9 % — AB (ref 36.0–46.0)
Hemoglobin: 8.1 g/dL — ABNORMAL LOW (ref 12.0–15.0)
MCH: 27.8 pg (ref 26.0–34.0)
MCHC: 32.5 g/dL (ref 30.0–36.0)
MCV: 85.6 fL (ref 78.0–100.0)
PLATELETS: 558 10*3/uL — AB (ref 150–400)
RBC: 2.91 MIL/uL — ABNORMAL LOW (ref 3.87–5.11)
RDW: 13 % (ref 11.5–15.5)
WBC: 20.9 10*3/uL — ABNORMAL HIGH (ref 4.0–10.5)

## 2015-05-11 LAB — URINE MICROSCOPIC-ADD ON
BACTERIA UA: NONE SEEN
SQUAMOUS EPITHELIAL / LPF: NONE SEEN

## 2015-05-11 LAB — GLUCOSE, CAPILLARY
GLUCOSE-CAPILLARY: 209 mg/dL — AB (ref 65–99)
GLUCOSE-CAPILLARY: 212 mg/dL — AB (ref 65–99)
Glucose-Capillary: 210 mg/dL — ABNORMAL HIGH (ref 65–99)

## 2015-05-11 LAB — BASIC METABOLIC PANEL
ANION GAP: 9 (ref 5–15)
BUN: 28 mg/dL — AB (ref 6–20)
CALCIUM: 10.6 mg/dL — AB (ref 8.9–10.3)
CO2: 18 mmol/L — AB (ref 22–32)
CREATININE: 1.18 mg/dL — AB (ref 0.44–1.00)
Chloride: 118 mmol/L — ABNORMAL HIGH (ref 101–111)
GFR calc Af Amer: 54 mL/min — ABNORMAL LOW (ref >60)
GFR calc non Af Amer: 47 mL/min — ABNORMAL LOW (ref >60)
GLUCOSE: 254 mg/dL — AB (ref 65–99)
Potassium: 4.1 mmol/L (ref 3.5–5.1)
Sodium: 145 mmol/L (ref 135–145)

## 2015-05-11 LAB — URINALYSIS, ROUTINE W REFLEX MICROSCOPIC
Bilirubin Urine: NEGATIVE
Glucose, UA: 100 mg/dL — AB
KETONES UR: NEGATIVE mg/dL
Nitrite: NEGATIVE
PROTEIN: 30 mg/dL — AB
SPECIFIC GRAVITY, URINE: 1.015 (ref 1.005–1.030)
pH: 7 (ref 5.0–8.0)

## 2015-05-11 MED ORDER — LIP MEDEX EX OINT
TOPICAL_OINTMENT | CUTANEOUS | Status: AC
  Administered 2015-05-11: 11:00:00
  Filled 2015-05-11: qty 7

## 2015-05-11 MED ORDER — INSULIN GLARGINE 100 UNIT/ML ~~LOC~~ SOLN
65.0000 [IU] | Freq: Every day | SUBCUTANEOUS | Status: DC
  Administered 2015-05-11: 65 [IU] via SUBCUTANEOUS
  Filled 2015-05-11 (×2): qty 0.65

## 2015-05-11 MED ORDER — TRACE MINERALS CR-CU-MN-SE-ZN 10-1000-500-60 MCG/ML IV SOLN
INTRAVENOUS | Status: AC
  Administered 2015-05-11: 17:00:00 via INTRAVENOUS
  Filled 2015-05-11: qty 1320

## 2015-05-11 MED ORDER — FAT EMULSION 20 % IV EMUL
192.0000 mL | INTRAVENOUS | Status: AC
  Administered 2015-05-11: 192 mL via INTRAVENOUS
  Filled 2015-05-11: qty 250

## 2015-05-11 NOTE — Progress Notes (Signed)
PARENTERAL NUTRITION CONSULT NOTE - FOLLOW-UP  Pharmacy Consult for TPN Indication: severe necrotizing pancreatitis with ileus  No Known Allergies  Patient Measurements: Height: 5' 2"  (157.5 cm) Weight: 139 lb 5.3 oz (63.2 kg) IBW/kg (Calculated) : 50.1   Vital Signs: Temp: 101.3 F (38.5 C) (01/14 0400) Temp Source: Axillary (01/14 0400) BP: 136/110 mmHg (01/14 0100) Pulse Rate: 103 (01/13 2355) Intake/Output from previous day: 01/13 0701 - 01/14 0700 In: 700 [I.V.:550; IV Piggyback:150] Out: 4034 [Urine:3050; Stool:1]    Labs:  Recent Labs  05/10/15 0412 05/11/15 0500  WBC 20.1* 20.9*  HGB 7.8* 8.1*  HCT 24.1* 24.9*  PLT 556* 558*     Recent Labs  05/09/15 0400 05/10/15 0412 05/11/15 0305  NA 151* 149* 145  K 4.5 4.5 4.1  CL 124* 123* 118*  CO2 18* 18* 18*  GLUCOSE 282* 235* 254*  BUN 28* 29* 28*  CREATININE 1.40* 1.28* 1.18*  CALCIUM 10.3 10.3 10.6*  MG 1.8 1.7  --   PHOS 3.1 3.3  --   PROT 5.7*  --   --   ALBUMIN 1.7*  --   --   AST 48*  --   --   ALT 26  --   --   ALKPHOS 141*  --   --   BILITOT 0.8  --   --    Estimated Creatinine Clearance: 40.9 mL/min (by C-G formula based on Cr of 1.18).    Recent Labs  05/10/15 1957 05/10/15 2355 05/11/15 0346  GLUCAP 196* 210* 209*    Medical History: Past Medical History  Diagnosis Date  . Olivopontocerebellar atrophy (HCC)     Mgmt Dr Brett Fairy  . Diabetes mellitus     Type II. Insulin dependent. Diabetic nephropathy.    Insulin Requirements:  Lantus 45 units daily, 85 units of regular insulin added to TPN, 39 units of SSI (resistant scale)  Current Nutrition: Clear liquid diet  IVF: D5W at 14m/hr  Central access: PICC per IR TPN start date: 1/9  ASSESSMENT                                                                                                          HPI:  Victoria Morrow presents with N/V and abdominal pain on 1/1, found to have gallstone pancreatitis.  CT revealing necrotizing  pancreatitis. Surgery note form 1/8 states severe abdominal distention and ileus.  Pharmacy asked to start TPN on 1/7 but PICC has not been able to be placed by IV team.  Plans for IR to place PICC. Of note, she has DM on lantus and metformin prior to admission as well as hyperlipidemia on statin therapy.   Significant events:   05/09/2015 - Stool occurrence x2  1/13 NG removed. Remains on CLD.  Today, 05/11/2015:   Glucose - still uncontrolled with max amount of insulin allowed in Clinimix and with Lantus dose was increased to 45 units daily.  Electrolytes - Na WNL and Cl improved with D5W; Corr Ca 12.9 (Ca x Phos < 55), Mg, phos WNL yesterday  Renal - AKI resolving, UOP/24h = 65m/kg/hr, I/O net -1L yesterday with TPN added to flowsheet  LFTs - AST elevated at 48, ALT WNL, alk phos elevated (1/12)  TGs - 139 (1/9)  Prealbumin - 6.9 (1/9)  NUTRITIONAL GOALS                                                                                           RD recs: Kcal: 1800-2000 Protein: 85-95 grams Fluid: 2-2.2 L/day  Clinimix E 5/15 at a goal rate of 75 ml/hr + 20% fat emulsion at 10 ml/hr to provide: 90 g/day protein, 1758 Kcal/day. (could consider increase to 872mhr if tolerates)  PLAN                                                                                                                         At 1800 today:  Continue Clinimix 5/15 at 5583mr (remains below goal).  Keep electrolytes removed due to hypercalcemia   Will not advance due to hyperglycemia  TPN is hypotonic so should have little to no contribution to hypernatremia.  Increase insulin in TPN so the patient receives 85 units of regular insulin over 24 hour TPN infusion.  This equates to max concentration of insulin that we can place in the Clinimix (65 units/L).   TPN to contain standard multivitamins and trace elements.  20% fat emulsion at 8 ml/hr  Increase Lantus to 65 units daily.  MIVF per  MD.  Continue resistant SSI q4h.  TPN lab panels on Mondays & Thursdays.  BMET in AM.  AmaHershal CoriaharmD, BCPS Pager: [phone] 7:32 AM

## 2015-05-11 NOTE — Progress Notes (Signed)
[facility] PROGRESS NOTE  Ave Filternn C Sao ZOX:[id number] DOB: 1951/02/10 DOA: 04/28/2015 PCP: Maryelizabeth RowanEWEY,ELIZABETH, MD  HPI/Brief narrative 65 year old African-American female with a past medical history of type 2 diabetes on insulin, chronic kidney disease of unknown stage, presented with abdominal pain, nausea and vomiting. She was found to have severe pancreatitis thought to be biliary in origin. She was hospitalized for further management. Critical care medicine was consulted. 1/4 vomited, aspirated, intubated, shock  Required neo since 1/7, transferred care under critical care 1/5 extubated 1/7 SVT requiring cardizem gtt 1/8 Vomiting - NG to LIS 1/12 off cardizem gtt 1/14 care transferred to [facility], with critical care and general surgery following   Assessment/Plan:  necrotizing pancreatitis CT scan with evidence of gallstones. Patient denies any history of alcohol intake. Ultrasound of the abdomen demonstrates GB thickening with pericholecystic fluid and 5mm stone lodged in neck of gallbladder and no Murphy's sign. CBD is normal size. General surgery is following. No clear evidence for acute cholecystitis. T Started on clear liquid diet, continue on abx, tpn, general surgery and critical care following  Fever: has been fever free since 1/10 but spiked  fever again on 1/14 tmax 101.3, repeat culture, cxr increased right lower lobe opacity, will get ct chest/ab/pel.  VDRF; intubated on 1/14 and extubated on 1/15  Septic shock: required intubation and pressors. Now off pressors, extubated.   PAF/SVT: required cardizem drip, now on lopressor. Echo pending  UTI: kleb pneu finished abx treatment  Urinary retention: urology consulted, foley placed by urology  ARF on CKD III; better, cr now back to baseline   Transaminitis LFTs are slightly improved. Probably related to her necrotizing pancreatitis/sepsis Ultrasound of the right upper quadrant as above. Hepatitis panel is negative.  Continue to trend LFTs.  Insulin dependent Diabetes mellitus type 2, poorly controlled, with chronic kidney disease . HbA1c is 7.7.  Continue insulin, adjust prn   Code Status: Full Family Communication: Pt and brother  in room Disposition Plan: not medically ready, remain in stepdown   Consultants:  TRH-1/4PCCM -1/14TRH  General Surgery  Urology  Procedures:  Intubation /extubation/ng  Central line  tpn  Antibiotics: Anti-infectives    Start     Dose/Rate Route Frequency Ordered Stop   05/08/15 2200  meropenem (MERREM) 1 g in sodium chloride 0.9 % 100 mL IVPB     1 g 200 mL/hr over 30 Minutes Intravenous Every 12 hours 05/08/15 1054     05/03/15 1200  vancomycin (VANCOCIN) 50 mg/mL oral solution 500 mg  Status:  Discontinued     500 mg Oral 4 times per day 05/03/15 0917 05/04/15 1306   05/02/15 1200  cefTRIAXone (ROCEPHIN) 1 g in dextrose 5 % 50 mL IVPB  Status:  Discontinued     1 g 100 mL/hr over 30 Minutes Intravenous Every 24 hours 05/01/15 1448 05/01/15 2004   05/02/15 0600  imipenem-cilastatin (PRIMAXIN) 250 mg in sodium chloride 0.9 % 100 mL IVPB  Status:  Discontinued     250 mg 200 mL/hr over 30 Minutes Intravenous 3 times per day 05/01/15 2025 05/02/15 0027   05/02/15 0030  meropenem (MERREM) 500 mg in sodium chloride 0.9 % 50 mL IVPB  Status:  Discontinued     500 mg 100 mL/hr over 30 Minutes Intravenous Every 12 hours 05/02/15 0027 05/08/15 1054   05/01/15 2030  imipenem-cilastatin (PRIMAXIN) 250 mg in sodium chloride 0.9 % 100 mL IVPB     250 mg 200 mL/hr over 30 Minutes Intravenous NOW  05/01/15 2025 05/01/15 2110   04/30/15 1100  metroNIDAZOLE (FLAGYL) IVPB 500 mg  Status:  Discontinued     500 mg 100 mL/hr over 60 Minutes Intravenous Every 12 hours 04/30/15 0951 05/02/15 0751   04/30/15 1000  cefTRIAXone (ROCEPHIN) 1 g in dextrose 5 % 50 mL IVPB  Status:  Discontinued     1 g 100 mL/hr over 30 Minutes Intravenous Every 24 hours 04/30/15 0939  05/01/15 1205   04/30/15 0945  cefTRIAXone (ROCEPHIN) injection 1 g  Status:  Discontinued     1 g Intramuscular Every 24 hours 04/30/15 0930 04/30/15 0938   04/29/15 0200  imipenem-cilastatin (PRIMAXIN) 250 mg in sodium chloride 0.9 % 100 mL IVPB  Status:  Discontinued     250 mg 200 mL/hr over 30 Minutes Intravenous Every 8 hours 04/28/15 1812 04/30/15 0930   04/28/15 1830  imipenem-cilastatin (PRIMAXIN) 250 mg in sodium chloride 0.9 % 100 mL IVPB     250 mg 200 mL/hr over 30 Minutes Intravenous  Once 04/28/15 1812 04/28/15 1904      HPI/Subjective: Very frail, denies pain, brother in room  Objective: Filed Vitals:   05/10/15 2355 05/11/15 0000 05/11/15 0100 05/11/15 0400  BP: 130/74 149/76 136/110   Pulse: 103     Temp: 100.2 F (37.9 C)   101.3 F (38.5 C)  TempSrc: Oral   Axillary  Resp: 31 32 33   Height:      Weight:      SpO2: 100%       Intake/Output Summary (Last 24 hours) at 05/11/15 0942 Last data filed at 05/11/15 0640  Gross per 24 hour  Intake    600 ml  Output   2700 ml  Net  -2100 ml   Filed Weights   05/01/15 0503 05/02/15 0400 05/09/15 0553  Weight: 66.5 kg (146 lb 9.7 oz) 70.5 kg (155 lb 6.8 oz) 63.2 kg (139 lb 5.3 oz)    Exam:   General:  Very frail  Cardiovascular: mild sinus tachycardic, s1, s2  Respiratory: decreased at basis, no wheezing, no rales, no rhonchi  Abdomen: obese, decreased BS, non tender  Musculoskeletal: perfused, no clubbing   Data Reviewed: Basic Metabolic Panel:  Recent Labs Lab 05/06/15 0325 05/07/15 0400 05/08/15 0440 05/09/15 0400 05/10/15 0412 05/11/15 0305  NA 148* 151* 152* 151* 149* 145  K 3.5 3.8 4.2 4.5 4.5 4.1  CL 120* 123* 124* 124* 123* 118*  CO2 18* 19* 19* 18* 18* 18*  GLUCOSE 175* 227* 278* 282* 235* 254*  BUN 29* 28* 29* 28* 29* 28*  CREATININE 1.79* 1.75* 1.42* 1.40* 1.28* 1.18*  CALCIUM 8.8* 9.1 9.2 10.3 10.3 10.6*  MG 2.1 2.1 1.9 1.8 1.7  --   PHOS 2.8 2.6 3.1 3.1 3.3  --    Liver  Function Tests:  Recent Labs Lab 05/06/15 0325 05/09/15 0400  AST 32 48*  ALT 25 26  ALKPHOS 92 141*  BILITOT 0.8 0.8  PROT 6.0* 5.7*  ALBUMIN 2.1* 1.7*   No results for input(s): LIPASE, AMYLASE in the last 168 hours. No results for input(s): AMMONIA in the last 168 hours. CBC:  Recent Labs Lab 05/06/15 0325 05/07/15 0400 05/08/15 0440 05/10/15 0412 05/11/15 0500  WBC 25.6* 26.0* 25.0* 20.1* 20.9*  NEUTROABS 22.8*  --   --   --   --   HGB 9.2* 8.7* 7.9* 7.8* 8.1*  HCT 27.5* 25.8* 24.2* 24.1* 24.9*  MCV 84.9 85.7 85.2 87.0 85.6  PLT 498* 572* 545* 556* 558*   Cardiac Enzymes: No results for input(s): CKTOTAL, CKMB, CKMBINDEX, TROPONINI in the last 168 hours. BNP (last 3 results) No results for input(s): BNP in the last 8760 hours.  ProBNP (last 3 results) No results for input(s): PROBNP in the last 8760 hours.  CBG:  Recent Labs Lab 05/10/15 1156 05/10/15 1600 05/10/15 1957 05/10/15 2355 05/11/15 0346  GLUCAP 237* 220* 196* 210* 209*    Recent Results (from the past 240 hour(s))  Culture, blood (Routine X 2) w Reflex to ID Panel     Status: None   Collection Time: 05/02/15  9:25 AM  Result Value Ref Range Status   Specimen Description BLOOD LEFT ARM  Final   Special Requests IN PEDIATRIC BOTTLE 3CC  Final   Culture   Final    NO GROWTH 5 DAYS Performed at [facility]    Report Status 05/07/2015 FINAL  Final  Culture, respiratory (NON-Expectorated)     Status: None   Collection Time: 05/02/15  9:59 AM  Result Value Ref Range Status   Specimen Description TRACHEAL ASPIRATE  Final   Special Requests Normal  Final   Gram Stain   Final    FEW WBC PRESENT,BOTH PMN AND MONONUCLEAR RARE SQUAMOUS EPITHELIAL CELLS PRESENT NO ORGANISMS SEEN Performed at [facility]    Culture   Final    NO GROWTH 2 DAYS Performed at [facility]    Report Status 05/05/2015 FINAL  Final  C difficile quick scan w PCR reflex     Status: None    Collection Time: 05/03/15 10:30 AM  Result Value Ref Range Status   C Diff antigen NEGATIVE NEGATIVE Final   C Diff toxin NEGATIVE NEGATIVE Final   C Diff interpretation Negative for toxigenic C. difficile  Corrected    Comment: CORRECTED ON 01/06 AT 1423: PREVIOUSLY REPORTED AS NEGATIVE     Studies: No results found.  Scheduled Meds: . antiseptic oral rinse  7 mL Mouth Rinse q12n4p  . aspirin  81 mg Oral Daily  . chlorhexidine  15 mL Mouth Rinse BID  . famotidine (PEPCID) IV  20 mg Intravenous Q24H  . heparin  5,000 Units Subcutaneous 3 times per day  . insulin aspart  0-20 Units Subcutaneous 6 times per day  . insulin glargine  65 Units Subcutaneous Daily  . meropenem (MERREM) IV  1 g Intravenous Q12H  . metoprolol  5 mg Intravenous 3 times per day  . sodium chloride  10-40 mL Intracatheter Q12H  . sodium chloride  3 mL Intravenous Q12H   Continuous Infusions: . dextrose 50 mL/hr at 05/11/15 0845  . TPN (CLINIMIX) Adult without lytes 55 mL/hr at 05/10/15 1730   And  . fat emulsion 192 mL (05/10/15 1730)  . TPN (CLINIMIX) Adult without lytes     And  . fat emulsion      Principal Problem:   Acute necrotizing pancreatitis with SIRS Active Problems:   Gallstones   Transaminitis   DM type 2 (diabetes mellitus, type 2) (HCC)   Hyperlipidemia   ARF (acute renal failure) (HCC)   Acute respiratory failure with hypoxia (HCC)   Aspiration pneumonia due to vomit (HCC)   Acute on chronic respiratory failure (HCC)   Protein-calorie malnutrition, moderate (HCC)   Umbilical hernia   Chronic kidney disease (CKD), stage III (moderate)   Olivopontocerebellar atrophy (HCC)    Raghav Verrilli MD PhD  [facility] Pager [phone] If 7PM-7AM, please  contact night-coverage at [email or web address], password [facility] 05/11/2015, 9:42 AM  LOS: 13 days

## 2015-05-11 NOTE — Progress Notes (Signed)
Subjective: No complaints. She says she is feeling better  Objective: Vital signs in last 24 hours: Temp:  [97.7 F (36.5 C)-101.3 F (38.5 C)] 101.3 F (38.5 C) (01/14 0400) Pulse Rate:  [100-131] 103 (01/13 2355) Resp:  [24-35] 33 (01/14 0100) BP: (125-156)/(68-126) 136/110 mmHg (01/14 0100) SpO2:  [100 %] 100 % (01/13 2355) Last BM Date: 05/10/15  Intake/Output from previous day: 01/13 0701 - 01/14 0700 In: 700 [I.V.:550; IV Piggyback:150] Out: 3051 [Urine:3050; Stool:1] Intake/Output this shift:    Resp: clear to auscultation bilaterally Cardio: regular rate and rhythm GI: soft, nontender. mild distension with some fullness superiorly  Lab Results:   Recent Labs  05/10/15 0412 05/11/15 0500  WBC 20.1* 20.9*  HGB 7.8* 8.1*  HCT 24.1* 24.9*  PLT 556* 558*   BMET  Recent Labs  05/10/15 0412 05/11/15 0305  NA 149* 145  K 4.5 4.1  CL 123* 118*  CO2 18* 18*  GLUCOSE 235* 254*  BUN 29* 28*  CREATININE 1.28* 1.18*  CALCIUM 10.3 10.6*   PT/INR No results for input(s): LABPROT, INR in the last 72 hours. ABG No results for input(s): PHART, HCO3 in the last 72 hours.  Invalid input(s): PCO2, PO2  Studies/Results: No results found.  Anti-infectives: Anti-infectives    Start     Dose/Rate Route Frequency Ordered Stop   05/08/15 2200  meropenem (MERREM) 1 g in sodium chloride 0.9 % 100 mL IVPB     1 g 200 mL/hr over 30 Minutes Intravenous Every 12 hours 05/08/15 1054     05/03/15 1200  vancomycin (VANCOCIN) 50 mg/mL oral solution 500 mg  Status:  Discontinued     500 mg Oral 4 times per day 05/03/15 0917 05/04/15 1306   05/02/15 1200  cefTRIAXone (ROCEPHIN) 1 g in dextrose 5 % 50 mL IVPB  Status:  Discontinued     1 g 100 mL/hr over 30 Minutes Intravenous Every 24 hours 05/01/15 1448 05/01/15 2004   05/02/15 0600  imipenem-cilastatin (PRIMAXIN) 250 mg in sodium chloride 0.9 % 100 mL IVPB  Status:  Discontinued     250 mg 200 mL/hr over 30 Minutes  Intravenous 3 times per day 05/01/15 2025 05/02/15 0027   05/02/15 0030  meropenem (MERREM) 500 mg in sodium chloride 0.9 % 50 mL IVPB  Status:  Discontinued     500 mg 100 mL/hr over 30 Minutes Intravenous Every 12 hours 05/02/15 0027 05/08/15 1054   05/01/15 2030  imipenem-cilastatin (PRIMAXIN) 250 mg in sodium chloride 0.9 % 100 mL IVPB     250 mg 200 mL/hr over 30 Minutes Intravenous NOW 05/01/15 2025 05/01/15 2110   04/30/15 1100  metroNIDAZOLE (FLAGYL) IVPB 500 mg  Status:  Discontinued     500 mg 100 mL/hr over 60 Minutes Intravenous Every 12 hours 04/30/15 0951 05/02/15 0751   04/30/15 1000  cefTRIAXone (ROCEPHIN) 1 g in dextrose 5 % 50 mL IVPB  Status:  Discontinued     1 g 100 mL/hr over 30 Minutes Intravenous Every 24 hours 04/30/15 0939 05/01/15 1205   04/30/15 0945  cefTRIAXone (ROCEPHIN) injection 1 g  Status:  Discontinued     1 g Intramuscular Every 24 hours 04/30/15 0930 04/30/15 0938   04/29/15 0200  imipenem-cilastatin (PRIMAXIN) 250 mg in sodium chloride 0.9 % 100 mL IVPB  Status:  Discontinued     250 mg 200 mL/hr over 30 Minutes Intravenous Every 8 hours 04/28/15 1812 04/30/15 0930   04/28/15 1830  imipenem-cilastatin (PRIMAXIN)  250 mg in sodium chloride 0.9 % 100 mL IVPB     250 mg 200 mL/hr over 30 Minutes Intravenous  Once 04/28/15 1812 04/28/15 1904      Assessment/Plan: s/p * No surgery found * Continue abx  Continue tpn and bowel rest Pancreatic necrosis stable Continue to monitor in icu  LOS: 13 days    TOTH III,Jahzion Brogden S 05/11/2015

## 2015-05-12 ENCOUNTER — Inpatient Hospital Stay (HOSPITAL_COMMUNITY): Payer: Commercial Managed Care - HMO

## 2015-05-12 ENCOUNTER — Other Ambulatory Visit (HOSPITAL_COMMUNITY): Payer: Medicare HMO

## 2015-05-12 DIAGNOSIS — R509 Fever, unspecified: Secondary | ICD-10-CM | POA: Insufficient documentation

## 2015-05-12 DIAGNOSIS — K8591 Acute pancreatitis with uninfected necrosis, unspecified: Secondary | ICD-10-CM | POA: Insufficient documentation

## 2015-05-12 DIAGNOSIS — L0291 Cutaneous abscess, unspecified: Secondary | ICD-10-CM

## 2015-05-12 LAB — GLUCOSE, CAPILLARY
GLUCOSE-CAPILLARY: 226 mg/dL — AB (ref 65–99)
GLUCOSE-CAPILLARY: 198 mg/dL — AB (ref 65–99)
GLUCOSE-CAPILLARY: 195 mg/dL — AB (ref 65–99)
GLUCOSE-CAPILLARY: 204 mg/dL — AB (ref 65–99)
GLUCOSE-CAPILLARY: 234 mg/dL — AB (ref 65–99)
Glucose-Capillary: 188 mg/dL — ABNORMAL HIGH (ref 65–99)
Glucose-Capillary: 224 mg/dL — ABNORMAL HIGH (ref 65–99)
Glucose-Capillary: 183 mg/dL — ABNORMAL HIGH (ref 65–99)
Glucose-Capillary: 164 mg/dL — ABNORMAL HIGH (ref 65–99)

## 2015-05-12 LAB — BASIC METABOLIC PANEL
ANION GAP: 9 (ref 5–15)
BUN: 28 mg/dL — ABNORMAL HIGH (ref 6–20)
CHLORIDE: 116 mmol/L — AB (ref 101–111)
CO2: 16 mmol/L — AB (ref 22–32)
Calcium: 10.5 mg/dL — ABNORMAL HIGH (ref 8.9–10.3)
Creatinine, Ser: 1.33 mg/dL — ABNORMAL HIGH (ref 0.44–1.00)
GFR calc Af Amer: 47 mL/min — ABNORMAL LOW (ref >60)
GFR calc non Af Amer: 41 mL/min — ABNORMAL LOW (ref >60)
GLUCOSE: 181 mg/dL — AB (ref 65–99)
POTASSIUM: 4.1 mmol/L (ref 3.5–5.1)
Sodium: 141 mmol/L (ref 135–145)

## 2015-05-12 LAB — GRAM STAIN

## 2015-05-12 LAB — LIPASE, BLOOD: Lipase: 44 U/L (ref 11–51)

## 2015-05-12 LAB — GLUCOSE, SEROUS FLUID: GLUCOSE FL: 179 mg/dL

## 2015-05-12 LAB — PROTIME-INR
INR: 1.18 (ref 0.00–1.49)
Prothrombin Time: 15.2 seconds (ref 11.6–15.2)

## 2015-05-12 LAB — AMYLASE, PERITONEAL FLUID: Amylase, peritoneal fluid: >10000 U/L

## 2015-05-12 LAB — PROTEIN, BODY FLUID

## 2015-05-12 LAB — BODY FLUID CELL COUNT WITH DIFFERENTIAL: WBC FLUID: 6 uL (ref 0–1000)

## 2015-05-12 LAB — ALBUMIN, FLUID (OTHER)

## 2015-05-12 LAB — LACTATE DEHYDROGENASE, PLEURAL OR PERITONEAL FLUID: LD, Fluid: 5826 U/L — ABNORMAL HIGH (ref 3–23)

## 2015-05-12 MED ORDER — FLUCONAZOLE IN SODIUM CHLORIDE 200-0.9 MG/100ML-% IV SOLN
200.0000 mg | Freq: Once | INTRAVENOUS | Status: AC
  Administered 2015-05-12: 200 mg via INTRAVENOUS
  Filled 2015-05-12: qty 100

## 2015-05-12 MED ORDER — FAT EMULSION 20 % IV EMUL
192.0000 mL | INTRAVENOUS | Status: AC
  Administered 2015-05-12: 192 mL via INTRAVENOUS
  Filled 2015-05-12: qty 250

## 2015-05-12 MED ORDER — DEXTROSE 5 % IV SOLN
INTRAVENOUS | Status: DC
  Administered 2015-05-13: 03:00:00 via INTRAVENOUS

## 2015-05-12 MED ORDER — FLUCONAZOLE IN SODIUM CHLORIDE 100-0.9 MG/50ML-% IV SOLN
100.0000 mg | INTRAVENOUS | Status: AC
  Administered 2015-05-13 – 2015-05-16 (×4): 100 mg via INTRAVENOUS
  Filled 2015-05-12 (×4): qty 50

## 2015-05-12 MED ORDER — METOPROLOL TARTRATE 1 MG/ML IV SOLN
INTRAVENOUS | Status: AC
  Filled 2015-05-12: qty 5

## 2015-05-12 MED ORDER — TRACE MINERALS CR-CU-MN-SE-ZN 10-1000-500-60 MCG/ML IV SOLN
INTRAVENOUS | Status: AC
  Administered 2015-05-12: 17:00:00 via INTRAVENOUS
  Filled 2015-05-12: qty 1320

## 2015-05-12 MED ORDER — DEXTROSE IN LACTATED RINGERS 5 % IV SOLN
INTRAVENOUS | Status: DC
  Administered 2015-05-12: 09:00:00 via INTRAVENOUS

## 2015-05-12 MED ORDER — INSULIN GLARGINE 100 UNIT/ML ~~LOC~~ SOLN
40.0000 [IU] | Freq: Two times a day (BID) | SUBCUTANEOUS | Status: DC
  Administered 2015-05-12 – 2015-05-13 (×3): 40 [IU] via SUBCUTANEOUS
  Filled 2015-05-12 (×3): qty 0.4

## 2015-05-12 NOTE — Progress Notes (Signed)
Subjective: Denies abdominal pain. Increased work of breathing today  Objective: Vital signs in last 24 hours: Temp:  [97.9 F (36.6 C)-100.4 F (38 C)] 100.4 F (38 C) (01/15 0400) Pulse Rate:  [28-132] 122 (01/15 0700) Resp:  [20-41] 31 (01/15 1000) BP: (102-159)/(60-81) 102/65 mmHg (01/15 1000) SpO2:  [64 %-100 %] 100 % (01/15 1000) Weight:  [62.5 kg (137 lb 12.6 oz)] 62.5 kg (137 lb 12.6 oz) (01/15 0923) Last BM Date: 05/10/15  Intake/Output from previous day: 01/14 0701 - 01/15 0700 In: 2145.3 [I.V.:1100; IV Piggyback:300; TPN:745.3] Out: 1900 [Urine:1900] Intake/Output this shift:    Resp: rhonchi bilaterally Cardio: regular rate and rhythm and tachy GI: soft, nontender. distended  Lab Results:   Recent Labs  05/10/15 0412 05/11/15 0500  WBC 20.1* 20.9*  HGB 7.8* 8.1*  HCT 24.1* 24.9*  PLT 556* 558*   BMET  Recent Labs  05/11/15 0305 05/12/15 0422  NA 145 141  K 4.1 4.1  CL 118* 116*  CO2 18* 16*  GLUCOSE 254* 181*  BUN 28* 28*  CREATININE 1.18* 1.33*  CALCIUM 10.6* 10.5*   PT/INR No results for input(s): LABPROT, INR in the last 72 hours. ABG No results for input(s): PHART, HCO3 in the last 72 hours.  Invalid input(s): PCO2, PO2  Studies/Results: Ct Abdomen Pelvis Wo Contrast  05/11/2015  CLINICAL DATA:  Severe pancreatitis. Intubated. Aspirated. Septic shock. EXAM: CT CHEST, ABDOMEN AND PELVIS WITHOUT CONTRAST TECHNIQUE: Multidetector CT imaging of the chest, abdomen and pelvis was performed following the standard protocol without IV contrast. COMPARISON:  CT abdomen pelvis 04/30/18 17 FINDINGS: CT CHEST FINDINGS Mediastinum/Nodes: RIGHT PICC line with tip in the distal SVC/RIGHT atrium. No axillary or supraclavicular adenopathy. No mediastinal hilar adenopathy. No pericardial fluid. Esophagus normal. Lungs/Pleura: There are small bilateral pleural effusions. There is bibasilar atelectasis with air bronchograms. Cannot exclude small foci of  pneumonia or aspiration pneumonitis. Findings have improved from comparison CT. Musculoskeletal: No aggressive osseous lesion. CT ABDOMEN AND PELVIS FINDINGS Hepatobiliary: No focal hepatic lesions noncontrast exam. No biliary duct dilatation. Gallbladder is poorly defined. Several gallstones are noted within the gallbladder which is nondistended. Pancreas: There is continued increase edematous enlargement of the body and head of the pancreas with poor definition and low attenuation suggesting pancreatic necrosis. The pancreatic thickening measures 5.4 cm (image 56, series 2) compared to 3.8 cm on prior. The tail the pancreas is normal densities and similar prior. No pancreatic head is poorly defined on the background of edema and fluid. There is interval increase in fluid along the RIGHT pericolic gutter with the collection measures 7.5 x 6.1 cm (image 72, series 2) increased from 6.4 x 3.6 cm. Increase fluid along the LEFT pericolic gutter which is less well organized. Spleen: Normal spleen Adrenals/urinary tract: Adrenal glands and kidneys are normal. Foley catheter within the bladder. Stomach/Bowel: The stomach, small bowel, and colon are normal. No evidence of bowel obstruction or pneumatosis pneumatosis. No IV oral contrast administered. Fluid stool rectum. Vascular/Lymphatic: Abdominal aorta is normal caliber. There is no retroperitoneal or periportal lymphadenopathy. No pelvic lymphadenopathy. Reproductive: Uterus there is normal. Other: No free fluid. Musculoskeletal: No aggressive osseous lesion. IMPRESSION: 1. Continued concern for pancreatic necrosis with poor definition of the head and body the pancreas with increased low-attenuation edema compared to prior exam. 2. Increased fluid along the pericolic gutters. The are no clear organized pseudocyst at this point. The RIGHT pericolic gutter collection is the most organized. 3. Some improvement in bibasilar atelectasis and  pleural effusions. There are  bronchograms which could indicate infection. 4. No bowel obstruction. 5. Cholelithiasis within collapsed gallbladder. Electronically Signed   By: Genevive Bi M.D.   On: 05/11/2015 18:57   Ct Chest Wo Contrast  05/11/2015  CLINICAL DATA:  Severe pancreatitis. Intubated. Aspirated. Septic shock. EXAM: CT CHEST, ABDOMEN AND PELVIS WITHOUT CONTRAST TECHNIQUE: Multidetector CT imaging of the chest, abdomen and pelvis was performed following the standard protocol without IV contrast. COMPARISON:  CT abdomen pelvis 04/30/18 17 FINDINGS: CT CHEST FINDINGS Mediastinum/Nodes: RIGHT PICC line with tip in the distal SVC/RIGHT atrium. No axillary or supraclavicular adenopathy. No mediastinal hilar adenopathy. No pericardial fluid. Esophagus normal. Lungs/Pleura: There are small bilateral pleural effusions. There is bibasilar atelectasis with air bronchograms. Cannot exclude small foci of pneumonia or aspiration pneumonitis. Findings have improved from comparison CT. Musculoskeletal: No aggressive osseous lesion. CT ABDOMEN AND PELVIS FINDINGS Hepatobiliary: No focal hepatic lesions noncontrast exam. No biliary duct dilatation. Gallbladder is poorly defined. Several gallstones are noted within the gallbladder which is nondistended. Pancreas: There is continued increase edematous enlargement of the body and head of the pancreas with poor definition and low attenuation suggesting pancreatic necrosis. The pancreatic thickening measures 5.4 cm (image 56, series 2) compared to 3.8 cm on prior. The tail the pancreas is normal densities and similar prior. No pancreatic head is poorly defined on the background of edema and fluid. There is interval increase in fluid along the RIGHT pericolic gutter with the collection measures 7.5 x 6.1 cm (image 72, series 2) increased from 6.4 x 3.6 cm. Increase fluid along the LEFT pericolic gutter which is less well organized. Spleen: Normal spleen Adrenals/urinary tract: Adrenal glands and  kidneys are normal. Foley catheter within the bladder. Stomach/Bowel: The stomach, small bowel, and colon are normal. No evidence of bowel obstruction or pneumatosis pneumatosis. No IV oral contrast administered. Fluid stool rectum. Vascular/Lymphatic: Abdominal aorta is normal caliber. There is no retroperitoneal or periportal lymphadenopathy. No pelvic lymphadenopathy. Reproductive: Uterus there is normal. Other: No free fluid. Musculoskeletal: No aggressive osseous lesion. IMPRESSION: 1. Continued concern for pancreatic necrosis with poor definition of the head and body the pancreas with increased low-attenuation edema compared to prior exam. 2. Increased fluid along the pericolic gutters. The are no clear organized pseudocyst at this point. The RIGHT pericolic gutter collection is the most organized. 3. Some improvement in bibasilar atelectasis and pleural effusions. There are bronchograms which could indicate infection. 4. No bowel obstruction. 5. Cholelithiasis within collapsed gallbladder. Electronically Signed   By: Genevive Bi M.D.   On: 05/11/2015 18:57   Dg Chest Port 1 View  05/11/2015  CLINICAL DATA:  Cough today.  Fever.  Follow-up exam. EXAM: PORTABLE CHEST 1 VIEW COMPARISON:  05/09/2015. FINDINGS: There has been an interval increase in right lung base opacity. Although this likely atelectasis, pneumonia is possible. Mild persistent opacity at the left lung base is consistent stable atelectasis. No evidence of pulmonary edema. No convincing pleural effusion no pneumothorax. The right PICC is stable with its tip at the caval atrial junction. IMPRESSION: Increased right lung base opacity may reflect increased atelectasis, pneumonia or a combination. No other change. Electronically Signed   By: Amie Portland M.D.   On: 05/11/2015 11:51    Anti-infectives: Anti-infectives    Start     Dose/Rate Route Frequency Ordered Stop   05/13/15 1000  fluconazole (DIFLUCAN) IVPB 100 mg     100 mg 50  mL/hr over 60 Minutes Intravenous Every 24  hours 05/12/15 0813 05/17/15 0959   05/12/15 1000  fluconazole (DIFLUCAN) IVPB 200 mg     200 mg 100 mL/hr over 60 Minutes Intravenous  Once 05/12/15 0813     05/08/15 2200  meropenem (MERREM) 1 g in sodium chloride 0.9 % 100 mL IVPB     1 g 200 mL/hr over 30 Minutes Intravenous Every 12 hours 05/08/15 1054     05/03/15 1200  vancomycin (VANCOCIN) 50 mg/mL oral solution 500 mg  Status:  Discontinued     500 mg Oral 4 times per day 05/03/15 0917 05/04/15 1306   05/02/15 1200  cefTRIAXone (ROCEPHIN) 1 g in dextrose 5 % 50 mL IVPB  Status:  Discontinued     1 g 100 mL/hr over 30 Minutes Intravenous Every 24 hours 05/01/15 1448 05/01/15 2004   05/02/15 0600  imipenem-cilastatin (PRIMAXIN) 250 mg in sodium chloride 0.9 % 100 mL IVPB  Status:  Discontinued     250 mg 200 mL/hr over 30 Minutes Intravenous 3 times per day 05/01/15 2025 05/02/15 0027   05/02/15 0030  meropenem (MERREM) 500 mg in sodium chloride 0.9 % 50 mL IVPB  Status:  Discontinued     500 mg 100 mL/hr over 30 Minutes Intravenous Every 12 hours 05/02/15 0027 05/08/15 1054   05/01/15 2030  imipenem-cilastatin (PRIMAXIN) 250 mg in sodium chloride 0.9 % 100 mL IVPB     250 mg 200 mL/hr over 30 Minutes Intravenous NOW 05/01/15 2025 05/01/15 2110   04/30/15 1100  metroNIDAZOLE (FLAGYL) IVPB 500 mg  Status:  Discontinued     500 mg 100 mL/hr over 60 Minutes Intravenous Every 12 hours 04/30/15 0951 05/02/15 0751   04/30/15 1000  cefTRIAXone (ROCEPHIN) 1 g in dextrose 5 % 50 mL IVPB  Status:  Discontinued     1 g 100 mL/hr over 30 Minutes Intravenous Every 24 hours 04/30/15 0939 05/01/15 1205   04/30/15 0945  cefTRIAXone (ROCEPHIN) injection 1 g  Status:  Discontinued     1 g Intramuscular Every 24 hours 04/30/15 0930 04/30/15 0938   04/29/15 0200  imipenem-cilastatin (PRIMAXIN) 250 mg in sodium chloride 0.9 % 100 mL IVPB  Status:  Discontinued     250 mg 200 mL/hr over 30 Minutes  Intravenous Every 8 hours 04/28/15 1812 04/30/15 0930   04/28/15 1830  imipenem-cilastatin (PRIMAXIN) 250 mg in sodium chloride 0.9 % 100 mL IVPB     250 mg 200 mL/hr over 30 Minutes Intravenous  Once 04/28/15 1812 04/28/15 1904      Assessment/Plan: s/p * No surgery found * Continue abx, diflucan and meropenem Continue tpn and bowel rest 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 Would only intervene surgically as a last resort Increased work of breathing today. Monitor closely for need for intubation  LOS: 14 days    TOTH III,PAUL S 05/12/2015

## 2015-05-12 NOTE — Progress Notes (Signed)
PARENTERAL NUTRITION CONSULT NOTE - FOLLOW-UP  Pharmacy Consult for TPN Indication: severe necrotizing pancreatitis with ileus  No Known Allergies  Patient Measurements: Height: 5' 2"  (157.5 cm) Weight: 139 lb 5.3 oz (63.2 kg) IBW/kg (Calculated) : 50.1   Vital Signs: Temp: 100.4 F (38 C) (01/15 0400) Temp Source: Oral (01/15 0400) BP: 121/61 mmHg (01/15 0500) Pulse Rate: 35 (01/15 0500) Intake/Output from previous day: 01/14 0701 - 01/15 0700 In: 2145.3 [I.V.:1100; IV Piggyback:300; TPN:745.3] Out: 1900 [Urine:1900]    Labs:  Recent Labs  05/10/15 0412 05/11/15 0500  WBC 20.1* 20.9*  HGB 7.8* 8.1*  HCT 24.1* 24.9*  PLT 556* 558*     Recent Labs  05/10/15 0412 05/11/15 0305 05/12/15 0422  NA 149* 145 141  K 4.5 4.1 4.1  CL 123* 118* 116*  CO2 18* 18* 16*  GLUCOSE 235* 254* 181*  BUN 29* 28* 28*  CREATININE 1.28* 1.18* 1.33*  CALCIUM 10.3 10.6* 10.5*  MG 1.7  --   --   PHOS 3.3  --   --    Estimated Creatinine Clearance: 36.3 mL/min (by C-G formula based on Cr of 1.33).    Recent Labs  05/11/15 2339 05/12/15 0434 05/12/15 0730  GLUCAP 212* 183* 164*    Medical History: Past Medical History  Diagnosis Date  . Olivopontocerebellar atrophy (HCC)     Mgmt Dr Brett Fairy  . Diabetes mellitus     Type II. Insulin dependent. Diabetic nephropathy.    Insulin Requirements:  Lantus 65 units daily, 85 units of regular insulin added to TPN, 33 units of SSI (resistant scale)  Current Nutrition: Clear liquid diet  IVF: D5W at 63m/hr  Central access: PICC per IR TPN start date: 1/9  ASSESSMENT                                                                                                          HPI:  Victoria Morrow presents with N/V and abdominal pain on 1/1, found to have gallstone pancreatitis.  CT revealing necrotizing pancreatitis. Surgery note form 1/8 states severe abdominal distention and ileus.  Pharmacy asked to start TPN on 1/7 but PICC has not  been able to be placed by IV team.  Plans for IR to place PICC. Of note, she has DM on lantus and metformin prior to admission as well as hyperlipidemia on statin therapy.   Significant events:   05/09/2015 - Stool occurrence x2  1/13 NG removed. Remains on CLD.  Today, 05/12/2015:   Glucose - still uncontrolled with max amount of insulin allowed in Clinimix and with Lantus dose was increased to 45 units daily.  Will increase Lantus further today.  Electrolytes - Na WNL and Cl improving with D5W, CO2 low and decreasing. Corr Ca 12.3 (Ca x Phos < 55)  Renal - AKI resolving, UOP/24h = 1.335mkg/hr, daily I/O appears to be missing some data. Estimate net +1L yesterday with total volume of TPN added to flowsheet.  Weights since admission variable.  Re-weight patient today.  LFTs - AST elevated at 48,  ALT WNL, alk phos elevated (1/12)  TGs - 139 (1/9)  Prealbumin - 6.9 (1/9)  NUTRITIONAL GOALS                                                                                           RD recs: Kcal: 1800-2000 Protein: 85-95 grams Fluid: 2-2.2 L/day  Clinimix E 5/15 at a goal rate of 75 ml/hr + 20% fat emulsion at 10 ml/hr to provide: 90 g/day protein, 1758 Kcal/day. (could consider increase to 31m/hr if tolerates)  PLAN                                                                                                                         At 1800 today:  Continue Clinimix 5/15 at 528mhr (remains below goal).  Keep electrolytes removed due to hypercalcemia   Will not advance due to hyperglycemia  Increase insulin in TPN so the patient receives 85 units of regular insulin over 24 hour TPN infusion.  This equates to max concentration of insulin that we can place in the Clinimix (65 units/L).   TPN to contain standard multivitamins and trace elements.  20% fat emulsion at 8 ml/hr  Increase Lantus to 40 units q12h (80 units/day).  Continue D5W at 50 ml/hr per MD. Not a reasonable  fluid alternative at this point with chloride still elevated.  Had discussion with MD and patient needs continued fluid replacement.   Weight patient today.  Continue resistant SSI q4h.  TPN lab panels on Mondays & Thursdays.  AmHershal CoriaPharmD, BCPS Pager: [phone] 3137/15/2017 8:06 AM

## 2015-05-12 NOTE — Procedures (Signed)
RLQ paracentesis No comp/EBL 

## 2015-05-12 NOTE — Progress Notes (Signed)
[facility] PROGRESS NOTE  Ave Filternn C Medinger ZOX:[id number] DOB: 15-Aug-1950 DOA: 04/28/2015 PCP: Maryelizabeth RowanEWEY,ELIZABETH, MD  HPI/Brief narrative 65 year old African-American female with a past medical history of type 2 diabetes on insulin, chronic kidney disease of unknown stage, presented with abdominal pain, nausea and vomiting. She was found to have severe pancreatitis thought to be biliary in origin. She was hospitalized for further management. Critical care medicine was consulted. 1/4 vomited, aspirated, intubated, shock  Required neo since 1/7, transferred care under critical care 1/5 extubated 1/7 SVT requiring cardizem gtt 1/8 Vomiting - NG to LIS 1/12 off cardizem gtt 1/14 care transferred to [facility], with critical care and general surgery following   Assessment/Plan:  necrotizing pancreatitis CT scan with evidence of gallstones. Patient denies any history of alcohol intake. Ultrasound of the abdomen demonstrates GB thickening with pericholecystic fluid and 5mm stone lodged in neck of gallbladder and no Murphy's sign. CBD is normal size. General surgery is following. No clear evidence for acute cholecystitis. T Started on clear liquid diet, continue on abx, tpn, general surgery and critical care following  Fever:  has been fever free since 1/10 but spiked fever again on 1/14 tmax 101.3,  repeat culture pending, ua +yeast, will start diflucan,   ct chest/ab/pel.  1. Continued concern for pancreatic necrosis with poor definition of the head and body the pancreas with increased low-attenuation edema compared to prior exam. 2. Increased fluid along the pericolic gutters. The are no clear organized pseudocyst at this point. The RIGHT pericolic gutter collection is the most organized. 3. Some improvement in bibasilar atelectasis and pleural effusions. There are bronchograms which could indicate infection. 4. No bowel obstruction. 5. Cholelithiasis within collapsed gallbladder. I have  discuss with general surgery Dr Daphine Deutschermartin, he recommended IR consult for fluids collection drain and culture, IR paged  VDRF; intubated on 1/14 and extubated on 1/15  Septic shock: required intubation and pressors. Now off pressors, extubated.   PAF/SVT: required cardizem drip, now on lopressor. Echo pending  UTI: kleb pneu finished abx treatment  Urinary retention: urology consulted, foley placed by urology  ARF on CKD III; better, cr now back to baseline, cr 2.8 on admission, now 1,33.   Transaminitis LFTs are slightly improved. Probably related to her necrotizing pancreatitis/sepsis Ultrasound of the right upper quadrant as above. Hepatitis panel is negative. Continue to trend LFTs.  Insulin dependent Diabetes mellitus type 2, poorly controlled, with chronic kidney disease . HbA1c is 7.7.  Continue insulin, adjust prn   Code Status: Full Family Communication: Pt and brother  in room Disposition Plan: not medically ready, remain in stepdown   Consultants:  TRH-1/4PCCM -1/14TRH  General Surgery  Urology  Procedures:  Intubation /extubation/ng  Central line  tpn  Antibiotics: Anti-infectives    Start     Dose/Rate Route Frequency Ordered Stop   05/08/15 2200  meropenem (MERREM) 1 g in sodium chloride 0.9 % 100 mL IVPB     1 g 200 mL/hr over 30 Minutes Intravenous Every 12 hours 05/08/15 1054     05/03/15 1200  vancomycin (VANCOCIN) 50 mg/mL oral solution 500 mg  Status:  Discontinued     500 mg Oral 4 times per day 05/03/15 0917 05/04/15 1306   05/02/15 1200  cefTRIAXone (ROCEPHIN) 1 g in dextrose 5 % 50 mL IVPB  Status:  Discontinued     1 g 100 mL/hr over 30 Minutes Intravenous Every 24 hours 05/01/15 1448 05/01/15 2004   05/02/15 0600  imipenem-cilastatin (PRIMAXIN) 250 mg  in sodium chloride 0.9 % 100 mL IVPB  Status:  Discontinued     250 mg 200 mL/hr over 30 Minutes Intravenous 3 times per day 05/01/15 2025 05/02/15 0027   05/02/15 0030  meropenem (MERREM)  500 mg in sodium chloride 0.9 % 50 mL IVPB  Status:  Discontinued     500 mg 100 mL/hr over 30 Minutes Intravenous Every 12 hours 05/02/15 0027 05/08/15 1054   05/01/15 2030  imipenem-cilastatin (PRIMAXIN) 250 mg in sodium chloride 0.9 % 100 mL IVPB     250 mg 200 mL/hr over 30 Minutes Intravenous NOW 05/01/15 2025 05/01/15 2110   04/30/15 1100  metroNIDAZOLE (FLAGYL) IVPB 500 mg  Status:  Discontinued     500 mg 100 mL/hr over 60 Minutes Intravenous Every 12 hours 04/30/15 0951 05/02/15 0751   04/30/15 1000  cefTRIAXone (ROCEPHIN) 1 g in dextrose 5 % 50 mL IVPB  Status:  Discontinued     1 g 100 mL/hr over 30 Minutes Intravenous Every 24 hours 04/30/15 0939 05/01/15 1205   04/30/15 0945  cefTRIAXone (ROCEPHIN) injection 1 g  Status:  Discontinued     1 g Intramuscular Every 24 hours 04/30/15 0930 04/30/15 0938   04/29/15 0200  imipenem-cilastatin (PRIMAXIN) 250 mg in sodium chloride 0.9 % 100 mL IVPB  Status:  Discontinued     250 mg 200 mL/hr over 30 Minutes Intravenous Every 8 hours 04/28/15 1812 04/30/15 0930   04/28/15 1830  imipenem-cilastatin (PRIMAXIN) 250 mg in sodium chloride 0.9 % 100 mL IVPB     250 mg 200 mL/hr over 30 Minutes Intravenous  Once 04/28/15 1812 04/28/15 1904      HPI/Subjective: Very frail, remain in sinus tachycardia, denies pain, had a small bm this am, denies n/v.  Objective: Filed Vitals:   05/12/15 0200 05/12/15 0300 05/12/15 0400 05/12/15 0500  BP: 159/64 109/60 105/65 121/61  Pulse:  119  35  Temp:   100.4 F (38 C)   TempSrc:   Oral   Resp: 41 33 30 36  Height:      Weight:      SpO2:  100%  95%    Intake/Output Summary (Last 24 hours) at 05/12/15 0811 Last data filed at 05/12/15 0500  Gross per 24 hour  Intake 2095.3 ml  Output   1900 ml  Net  195.3 ml   Filed Weights   05/01/15 0503 05/02/15 0400 05/09/15 0553  Weight: 66.5 kg (146 lb 9.7 oz) 70.5 kg (155 lb 6.8 oz) 63.2 kg (139 lb 5.3 oz)    Exam:   General:  Very  frail  Cardiovascular: mild sinus tachycardic, s1, s2  Respiratory: decreased at basis, no wheezing, no rales, no rhonchi  Abdomen: obese, decreased BS, non tender  Musculoskeletal: perfused, no clubbing   Data Reviewed: Basic Metabolic Panel:  Recent Labs Lab 05/06/15 0325 05/07/15 0400 05/08/15 0440 05/09/15 0400 05/10/15 0412 05/11/15 0305 05/12/15 0422  NA 148* 151* 152* 151* 149* 145 141  K 3.5 3.8 4.2 4.5 4.5 4.1 4.1  CL 120* 123* 124* 124* 123* 118* 116*  CO2 18* 19* 19* 18* 18* 18* 16*  GLUCOSE 175* 227* 278* 282* 235* 254* 181*  BUN 29* 28* 29* 28* 29* 28* 28*  CREATININE 1.79* 1.75* 1.42* 1.40* 1.28* 1.18* 1.33*  CALCIUM 8.8* 9.1 9.2 10.3 10.3 10.6* 10.5*  MG 2.1 2.1 1.9 1.8 1.7  --   --   PHOS 2.8 2.6 3.1 3.1 3.3  --   --  Liver Function Tests:  Recent Labs Lab 05/06/15 0325 05/09/15 0400  AST 32 48*  ALT 25 26  ALKPHOS 92 141*  BILITOT 0.8 0.8  PROT 6.0* 5.7*  ALBUMIN 2.1* 1.7*    Recent Labs Lab 05/12/15 0422  LIPASE 44   No results for input(s): AMMONIA in the last 168 hours. CBC:  Recent Labs Lab 05/06/15 0325 05/07/15 0400 05/08/15 0440 05/10/15 0412 05/11/15 0500  WBC 25.6* 26.0* 25.0* 20.1* 20.9*  NEUTROABS 22.8*  --   --   --   --   HGB 9.2* 8.7* 7.9* 7.8* 8.1*  HCT 27.5* 25.8* 24.2* 24.1* 24.9*  MCV 84.9 85.7 85.2 87.0 85.6  PLT 498* 572* 545* 556* 558*   Cardiac Enzymes: No results for input(s): CKTOTAL, CKMB, CKMBINDEX, TROPONINI in the last 168 hours. BNP (last 3 results) No results for input(s): BNP in the last 8760 hours.  ProBNP (last 3 results) No results for input(s): PROBNP in the last 8760 hours.  CBG:  Recent Labs Lab 05/11/15 1627 05/11/15 2028 05/11/15 2339 05/12/15 0434 05/12/15 0730  GLUCAP 198* 224* 212* 183* 164*    Recent Results (from the past 240 hour(s))  Culture, blood (Routine X 2) w Reflex to ID Panel     Status: None   Collection Time: 05/02/15  9:25 AM  Result Value Ref Range  Status   Specimen Description BLOOD LEFT ARM  Final   Special Requests IN PEDIATRIC BOTTLE 3CC  Final   Culture   Final    NO GROWTH 5 DAYS Performed at [facility]    Report Status 05/07/2015 FINAL  Final  Culture, respiratory (NON-Expectorated)     Status: None   Collection Time: 05/02/15  9:59 AM  Result Value Ref Range Status   Specimen Description TRACHEAL ASPIRATE  Final   Special Requests Normal  Final   Gram Stain   Final    FEW WBC PRESENT,BOTH PMN AND MONONUCLEAR RARE SQUAMOUS EPITHELIAL CELLS PRESENT NO ORGANISMS SEEN Performed at [facility]    Culture   Final    NO GROWTH 2 DAYS Performed at [facility]    Report Status 05/05/2015 FINAL  Final  C difficile quick scan w PCR reflex     Status: None   Collection Time: 05/03/15 10:30 AM  Result Value Ref Range Status   C Diff antigen NEGATIVE NEGATIVE Final   C Diff toxin NEGATIVE NEGATIVE Final   C Diff interpretation Negative for toxigenic C. difficile  Corrected    Comment: CORRECTED ON 01/06 AT 1423: PREVIOUSLY REPORTED AS NEGATIVE     Studies: Ct Abdomen Pelvis Wo Contrast  05/11/2015  CLINICAL DATA:  Severe pancreatitis. Intubated. Aspirated. Septic shock. EXAM: CT CHEST, ABDOMEN AND PELVIS WITHOUT CONTRAST TECHNIQUE: Multidetector CT imaging of the chest, abdomen and pelvis was performed following the standard protocol without IV contrast. COMPARISON:  CT abdomen pelvis 04/30/18 17 FINDINGS: CT CHEST FINDINGS Mediastinum/Nodes: RIGHT PICC line with tip in the distal SVC/RIGHT atrium. No axillary or supraclavicular adenopathy. No mediastinal hilar adenopathy. No pericardial fluid. Esophagus normal. Lungs/Pleura: There are small bilateral pleural effusions. There is bibasilar atelectasis with air bronchograms. Cannot exclude small foci of pneumonia or aspiration pneumonitis. Findings have improved from comparison CT. Musculoskeletal: No aggressive osseous lesion. CT ABDOMEN AND PELVIS  FINDINGS Hepatobiliary: No focal hepatic lesions noncontrast exam. No biliary duct dilatation. Gallbladder is poorly defined. Several gallstones are noted within the gallbladder which is nondistended. Pancreas: There is continued increase edematous  enlargement of the body and head of the pancreas with poor definition and low attenuation suggesting pancreatic necrosis. The pancreatic thickening measures 5.4 cm (image 56, series 2) compared to 3.8 cm on prior. The tail the pancreas is normal densities and similar prior. No pancreatic head is poorly defined on the background of edema and fluid. There is interval increase in fluid along the RIGHT pericolic gutter with the collection measures 7.5 x 6.1 cm (image 72, series 2) increased from 6.4 x 3.6 cm. Increase fluid along the LEFT pericolic gutter which is less well organized. Spleen: Normal spleen Adrenals/urinary tract: Adrenal glands and kidneys are normal. Foley catheter within the bladder. Stomach/Bowel: The stomach, small bowel, and colon are normal. No evidence of bowel obstruction or pneumatosis pneumatosis. No IV oral contrast administered. Fluid stool rectum. Vascular/Lymphatic: Abdominal aorta is normal caliber. There is no retroperitoneal or periportal lymphadenopathy. No pelvic lymphadenopathy. Reproductive: Uterus there is normal. Other: No free fluid. Musculoskeletal: No aggressive osseous lesion. IMPRESSION: 1. Continued concern for pancreatic necrosis with poor definition of the head and body the pancreas with increased low-attenuation edema compared to prior exam. 2. Increased fluid along the pericolic gutters. The are no clear organized pseudocyst at this point. The RIGHT pericolic gutter collection is the most organized. 3. Some improvement in bibasilar atelectasis and pleural effusions. There are bronchograms which could indicate infection. 4. No bowel obstruction. 5. Cholelithiasis within collapsed gallbladder. Electronically Signed   By: Genevive Bi M.D.   On: 05/11/2015 18:57   Ct Chest Wo Contrast  05/11/2015  CLINICAL DATA:  Severe pancreatitis. Intubated. Aspirated. Septic shock. EXAM: CT CHEST, ABDOMEN AND PELVIS WITHOUT CONTRAST TECHNIQUE: Multidetector CT imaging of the chest, abdomen and pelvis was performed following the standard protocol without IV contrast. COMPARISON:  CT abdomen pelvis 04/30/18 17 FINDINGS: CT CHEST FINDINGS Mediastinum/Nodes: RIGHT PICC line with tip in the distal SVC/RIGHT atrium. No axillary or supraclavicular adenopathy. No mediastinal hilar adenopathy. No pericardial fluid. Esophagus normal. Lungs/Pleura: There are small bilateral pleural effusions. There is bibasilar atelectasis with air bronchograms. Cannot exclude small foci of pneumonia or aspiration pneumonitis. Findings have improved from comparison CT. Musculoskeletal: No aggressive osseous lesion. CT ABDOMEN AND PELVIS FINDINGS Hepatobiliary: No focal hepatic lesions noncontrast exam. No biliary duct dilatation. Gallbladder is poorly defined. Several gallstones are noted within the gallbladder which is nondistended. Pancreas: There is continued increase edematous enlargement of the body and head of the pancreas with poor definition and low attenuation suggesting pancreatic necrosis. The pancreatic thickening measures 5.4 cm (image 56, series 2) compared to 3.8 cm on prior. The tail the pancreas is normal densities and similar prior. No pancreatic head is poorly defined on the background of edema and fluid. There is interval increase in fluid along the RIGHT pericolic gutter with the collection measures 7.5 x 6.1 cm (image 72, series 2) increased from 6.4 x 3.6 cm. Increase fluid along the LEFT pericolic gutter which is less well organized. Spleen: Normal spleen Adrenals/urinary tract: Adrenal glands and kidneys are normal. Foley catheter within the bladder. Stomach/Bowel: The stomach, small bowel, and colon are normal. No evidence of bowel obstruction or  pneumatosis pneumatosis. No IV oral contrast administered. Fluid stool rectum. Vascular/Lymphatic: Abdominal aorta is normal caliber. There is no retroperitoneal or periportal lymphadenopathy. No pelvic lymphadenopathy. Reproductive: Uterus there is normal. Other: No free fluid. Musculoskeletal: No aggressive osseous lesion. IMPRESSION: 1. Continued concern for pancreatic necrosis with poor definition of the head and body the pancreas with increased low-attenuation  edema compared to prior exam. 2. Increased fluid along the pericolic gutters. The are no clear organized pseudocyst at this point. The RIGHT pericolic gutter collection is the most organized. 3. Some improvement in bibasilar atelectasis and pleural effusions. There are bronchograms which could indicate infection. 4. No bowel obstruction. 5. Cholelithiasis within collapsed gallbladder. Electronically Signed   By: Genevive Bi M.D.   On: 05/11/2015 18:57   Dg Chest Port 1 View  05/11/2015  CLINICAL DATA:  Cough today.  Fever.  Follow-up exam. EXAM: PORTABLE CHEST 1 VIEW COMPARISON:  05/09/2015. FINDINGS: There has been an interval increase in right lung base opacity. Although this likely atelectasis, pneumonia is possible. Mild persistent opacity at the left lung base is consistent stable atelectasis. No evidence of pulmonary edema. No convincing pleural effusion no pneumothorax. The right PICC is stable with its tip at the caval atrial junction. IMPRESSION: Increased right lung base opacity may reflect increased atelectasis, pneumonia or a combination. No other change. Electronically Signed   By: Amie Portland M.D.   On: 05/11/2015 11:51    Scheduled Meds: . antiseptic oral rinse  7 mL Mouth Rinse q12n4p  . aspirin  81 mg Oral Daily  . chlorhexidine  15 mL Mouth Rinse BID  . famotidine (PEPCID) IV  20 mg Intravenous Q24H  . heparin  5,000 Units Subcutaneous 3 times per day  . insulin aspart  0-20 Units Subcutaneous 6 times per day  .  insulin glargine  65 Units Subcutaneous Daily  . meropenem (MERREM) IV  1 g Intravenous Q12H  . metoprolol  5 mg Intravenous 3 times per day  . sodium chloride  10-40 mL Intracatheter Q12H  . sodium chloride  3 mL Intravenous Q12H   Continuous Infusions: . dextrose 50 mL/hr at 05/11/15 0845  . TPN (CLINIMIX) Adult without lytes 55 mL/hr at 05/11/15 1710   And  . fat emulsion 192 mL (05/11/15 1710)    Principal Problem:   Acute necrotizing pancreatitis with SIRS Active Problems:   Gallstones   Transaminitis   DM type 2 (diabetes mellitus, type 2) (HCC)   Hyperlipidemia   ARF (acute renal failure) (HCC)   Acute respiratory failure with hypoxia (HCC)   Aspiration pneumonia due to vomit (HCC)   Acute on chronic respiratory failure (HCC)   Protein-calorie malnutrition, moderate (HCC)   Umbilical hernia   Chronic kidney disease (CKD), stage III (moderate)   Olivopontocerebellar atrophy (HCC)    Bodey Frizell MD PhD  [facility] Pager [phone] If 7PM-7AM, please contact night-coverage at [email or web address], password [facility] 05/12/2015, 8:11 AM  LOS: 14 days

## 2015-05-13 ENCOUNTER — Inpatient Hospital Stay (HOSPITAL_COMMUNITY): Payer: Commercial Managed Care - HMO

## 2015-05-13 DIAGNOSIS — I4891 Unspecified atrial fibrillation: Secondary | ICD-10-CM

## 2015-05-13 LAB — PH, BODY FLUID: pH, Body Fluid: 7.6

## 2015-05-13 LAB — COMPREHENSIVE METABOLIC PANEL
ALBUMIN: 1.4 g/dL — AB (ref 3.5–5.0)
ALK PHOS: 206 U/L — AB (ref 38–126)
ALT: 39 U/L (ref 14–54)
ANION GAP: 9 (ref 5–15)
AST: 56 U/L — ABNORMAL HIGH (ref 15–41)
BUN: 34 mg/dL — ABNORMAL HIGH (ref 6–20)
CHLORIDE: 115 mmol/L — AB (ref 101–111)
CO2: 15 mmol/L — AB (ref 22–32)
Calcium: 10.1 mg/dL (ref 8.9–10.3)
Creatinine, Ser: 1.35 mg/dL — ABNORMAL HIGH (ref 0.44–1.00)
GFR calc non Af Amer: 40 mL/min — ABNORMAL LOW (ref >60)
GFR, EST AFRICAN AMERICAN: 46 mL/min — AB (ref >60)
GLUCOSE: 173 mg/dL — AB (ref 65–99)
POTASSIUM: 3.8 mmol/L (ref 3.5–5.1)
SODIUM: 139 mmol/L (ref 135–145)
Total Bilirubin: 0.8 mg/dL (ref 0.3–1.2)
Total Protein: 5.4 g/dL — ABNORMAL LOW (ref 6.5–8.1)

## 2015-05-13 LAB — CBC
HEMATOCRIT: 21 % — AB (ref 36.0–46.0)
HEMOGLOBIN: 7.1 g/dL — AB (ref 12.0–15.0)
MCH: 27.8 pg (ref 26.0–34.0)
MCHC: 33.8 g/dL (ref 30.0–36.0)
MCV: 82.4 fL (ref 78.0–100.0)
Platelets: 504 10*3/uL — ABNORMAL HIGH (ref 150–400)
RBC: 2.55 MIL/uL — AB (ref 3.87–5.11)
RDW: 12.9 % (ref 11.5–15.5)
WBC: 22 10*3/uL — AB (ref 4.0–10.5)

## 2015-05-13 LAB — GLUCOSE, CAPILLARY
GLUCOSE-CAPILLARY: 202 mg/dL — AB (ref 65–99)
GLUCOSE-CAPILLARY: 175 mg/dL — AB (ref 65–99)
Glucose-Capillary: 145 mg/dL — ABNORMAL HIGH (ref 65–99)
Glucose-Capillary: 134 mg/dL — ABNORMAL HIGH (ref 65–99)
Glucose-Capillary: 151 mg/dL — ABNORMAL HIGH (ref 65–99)
Glucose-Capillary: 121 mg/dL — ABNORMAL HIGH (ref 65–99)

## 2015-05-13 LAB — DIFFERENTIAL
BASOS ABS: 0 10*3/uL (ref 0.0–0.1)
BASOS PCT: 0 %
EOS PCT: 0 %
Eosinophils Absolute: 0 10*3/uL (ref 0.0–0.7)
LYMPHS PCT: 11 %
Lymphs Abs: 2.4 10*3/uL (ref 0.7–4.0)
MONO ABS: 1.5 10*3/uL — AB (ref 0.1–1.0)
Monocytes Relative: 7 %
Neutro Abs: 18.1 10*3/uL — ABNORMAL HIGH (ref 1.7–7.7)
Neutrophils Relative %: 82 %

## 2015-05-13 LAB — TRIGLYCERIDES: Triglycerides: 130 mg/dL (ref <150)

## 2015-05-13 LAB — PREALBUMIN: PREALBUMIN: 5.3 mg/dL — AB (ref 18–38)

## 2015-05-13 LAB — TRIGLYCERIDES, BODY FLUIDS: TRIGLYCERIDES FL: 75 mg/dL

## 2015-05-13 LAB — PREPARE RBC (CROSSMATCH)

## 2015-05-13 LAB — ABO/RH: ABO/RH(D): B POS

## 2015-05-13 LAB — MAGNESIUM: Magnesium: 1.3 mg/dL — ABNORMAL LOW (ref 1.7–2.4)

## 2015-05-13 LAB — PHOSPHORUS: PHOSPHORUS: 3 mg/dL (ref 2.5–4.6)

## 2015-05-13 MED ORDER — FAT EMULSION 20 % IV EMUL
240.0000 mL | INTRAVENOUS | Status: AC
  Administered 2015-05-13: 240 mL via INTRAVENOUS
  Filled 2015-05-13: qty 250

## 2015-05-13 MED ORDER — INSULIN GLARGINE 100 UNIT/ML ~~LOC~~ SOLN
40.0000 [IU] | Freq: Two times a day (BID) | SUBCUTANEOUS | Status: DC
  Administered 2015-05-13 – 2015-05-14 (×3): 40 [IU] via SUBCUTANEOUS
  Filled 2015-05-13 (×4): qty 0.4

## 2015-05-13 MED ORDER — FAT EMULSION 20 % IV EMUL
192.0000 mL | INTRAVENOUS | Status: DC
  Filled 2015-05-13: qty 200

## 2015-05-13 MED ORDER — TRACE MINERALS CR-CU-MN-SE-ZN 10-1000-500-60 MCG/ML IV SOLN
INTRAVENOUS | Status: DC
  Filled 2015-05-13: qty 1320

## 2015-05-13 MED ORDER — MAGNESIUM SULFATE 2 GM/50ML IV SOLN
2.0000 g | Freq: Once | INTRAVENOUS | Status: AC
  Administered 2015-05-13: 2 g via INTRAVENOUS
  Filled 2015-05-13: qty 50

## 2015-05-13 MED ORDER — TRACE MINERALS CR-CU-MN-SE-ZN 10-1000-500-60 MCG/ML IV SOLN
INTRAVENOUS | Status: AC
  Administered 2015-05-13: 17:00:00 via INTRAVENOUS
  Filled 2015-05-13: qty 1680

## 2015-05-13 MED ORDER — TRACE MINERALS CR-CU-MN-SE-ZN 10-1000-500-60 MCG/ML IV SOLN: INTRAVENOUS | Status: DC

## 2015-05-13 MED ORDER — INSULIN GLARGINE 100 UNIT/ML ~~LOC~~ SOLN: 45.0000 [IU] | Freq: Two times a day (BID) | SUBCUTANEOUS | Status: DC

## 2015-05-13 MED ORDER — SODIUM CHLORIDE 0.9 % IV SOLN
Freq: Once | INTRAVENOUS | Status: AC
  Administered 2015-05-13: 18:00:00 via INTRAVENOUS

## 2015-05-13 NOTE — Progress Notes (Signed)
[facility] PROGRESS NOTE  Victoria Morrow WJX:[id number] DOB: [birth year] DOA: 04/28/2015 PCP: Maryelizabeth Rowan, MD  HPI/Brief narrative 65 year old African-American female with a past medical history of type 2 diabetes on insulin, chronic kidney disease of unknown stage, presented with abdominal pain, nausea and vomiting. She was found to have severe pancreatitis thought to be biliary in origin. She was hospitalized for further management. Critical care medicine was consulted. 1/4 vomited, aspirated, intubated, shock  Required neo since 1/7, transferred care under critical care 1/5 extubated 1/7 SVT requiring cardizem gtt 1/8 Vomiting - NG to LIS 1/12 off cardizem gtt 1/14 care transferred to [facility], with critical care and general surgery following   Assessment/Plan:  necrotizing pancreatitis CT scan with evidence of gallstones. Patient denies any history of alcohol intake. Ultrasound of the abdomen demonstrates GB thickening with pericholecystic fluid and 5mm stone lodged in neck of gallbladder and no Murphy's sign. CBD is normal size. General surgery is following. No clear evidence for acute cholecystitis. T Started on clear liquid diet, continue on abx, tpn, diflucan added on 1/14, general surgery and critical care following  Fever:  has been fever free since 1/10 but spiked fever again on 1/14 tmax 101.3,  repeat culture pending, ua +yeast, will start diflucan,   ct chest/ab/pel.  1. Continued concern for pancreatic necrosis with poor definition of the head and body the pancreas with increased low-attenuation edema compared to prior exam. 2. Increased fluid along the pericolic gutters. The are no clear organized pseudocyst at this point. The RIGHT pericolic gutter collection is the most organized. 3. Some improvement in bibasilar atelectasis and pleural effusions. There are bronchograms which could indicate infection. 4. No bowel obstruction. 5. Cholelithiasis within collapsed  gallbladder. I have discuss with general surgery Dr Daphine Deutscher, he recommended IR consult for fluids collection drain and culture. US guided para was done on 1/15, culture pending.  Anemia: likely anemia of chronic disease, patient remain sinus tachycardia, tachypenic, transfuse 1prbc on 1/15  Hypomagnesemia: replace mag.  Septic shock: required intubation and pressors. Now off pressors, extubated. Remain sinus tachcyaardia, tachypneic, febrile, leukocytosis.  VDRF; intubated on 1/14 and extubated on 1/15   PAF/SVT:  required cardizem drip, now on lopressor.  Has been in sinus tachycardia since transfer to [facility] Echo lvef 55%-60%, dynamic obstruction reported, grade 1 diastolic dysfunction reported.  Consider increase betablocker if bp allows.  UTI: kleb pneu finished abx treatment  Urinary retention: urology consulted initially during hospitalization due to difficult foley placed placement. She is found to have grade 2 cystocele and urethral stenosis.  ARF on CKD III; better, cr now back to baseline, cr 2.8 on admission, nadir 1.18 on 1/14, today 1,35.   Transaminitis LFTs are slightly improved. Probably related to her necrotizing pancreatitis/sepsis Ultrasound of the right upper quadrant as above. Hepatitis panel is negative. Continue to trend LFTs.  Insulin dependent Diabetes mellitus type 2, poorly controlled, with chronic kidney disease . HbA1c is 7.7.  Continue insulin, adjust prn D/c D5. Continue adjust insulin, TPN  Code Status: Full Family Communication: Pt  Disposition Plan:  remain in stepdown   Consultants:  TRH-1/4PCCM -1/14TRH  General Surgery  Urology  Procedures:  Intubation /extubation/ng  Central line  tpn  Antibiotics: Anti-infectives    Start     Dose/Rate Route Frequency Ordered Stop   05/13/15 1000  fluconazole (DIFLUCAN) IVPB 100 mg     100 mg 50 mL/hr over 60 Minutes Intravenous Every 24 hours 05/12/15 0813 05/17/15 0959   05/12/15 1000  fluconazole (DIFLUCAN) IVPB 200 mg     200 mg 100 mL/hr over 60 Minutes Intravenous  Once 05/12/15 0813 05/12/15 1029   05/08/15 2200  meropenem (MERREM) 1 g in sodium chloride 0.9 % 100 mL IVPB     1 g 200 mL/hr over 30 Minutes Intravenous Every 12 hours 05/08/15 1054     05/03/15 1200  vancomycin (VANCOCIN) 50 mg/mL oral solution 500 mg  Status:  Discontinued     500 mg Oral 4 times per day 05/03/15 0917 05/04/15 1306   05/02/15 1200  cefTRIAXone (ROCEPHIN) 1 g in dextrose 5 % 50 mL IVPB  Status:  Discontinued     1 g 100 mL/hr over 30 Minutes Intravenous Every 24 hours 05/01/15 1448 05/01/15 2004   05/02/15 0600  imipenem-cilastatin (PRIMAXIN) 250 mg in sodium chloride 0.9 % 100 mL IVPB  Status:  Discontinued     250 mg 200 mL/hr over 30 Minutes Intravenous 3 times per day 05/01/15 2025 05/02/15 0027   05/02/15 0030  meropenem (MERREM) 500 mg in sodium chloride 0.9 % 50 mL IVPB  Status:  Discontinued     500 mg 100 mL/hr over 30 Minutes Intravenous Every 12 hours 05/02/15 0027 05/08/15 1054   05/01/15 2030  imipenem-cilastatin (PRIMAXIN) 250 mg in sodium chloride 0.9 % 100 mL IVPB     250 mg 200 mL/hr over 30 Minutes Intravenous NOW 05/01/15 2025 05/01/15 2110   04/30/15 1100  metroNIDAZOLE (FLAGYL) IVPB 500 mg  Status:  Discontinued     500 mg 100 mL/hr over 60 Minutes Intravenous Every 12 hours 04/30/15 0951 05/02/15 0751   04/30/15 1000  cefTRIAXone (ROCEPHIN) 1 g in dextrose 5 % 50 mL IVPB  Status:  Discontinued     1 g 100 mL/hr over 30 Minutes Intravenous Every 24 hours 04/30/15 0939 05/01/15 1205   04/30/15 0945  cefTRIAXone (ROCEPHIN) injection 1 g  Status:  Discontinued     1 g Intramuscular Every 24 hours 04/30/15 0930 04/30/15 0938   04/29/15 0200  imipenem-cilastatin (PRIMAXIN) 250 mg in sodium chloride 0.9 % 100 mL IVPB  Status:  Discontinued     250 mg 200 mL/hr over 30 Minutes Intravenous Every 8 hours 04/28/15 1812 04/30/15 0930   04/28/15 1830  imipenem-cilastatin  (PRIMAXIN) 250 mg in sodium chloride 0.9 % 100 mL IVPB     250 mg 200 mL/hr over 30 Minutes Intravenous  Once 04/28/15 1812 04/28/15 1904      HPI/Subjective: Very frail, remain febrile, tachypneic, sinus tachycardia, denies pain, last bm on 1/14, denies n/v.  Objective: Filed Vitals:   05/13/15 0010 05/13/15 0400 05/13/15 0430 05/13/15 0800  BP:  92/51  105/61  Pulse:  123  113  Temp: 102.1 F (38.9 C)  98.4 F (36.9 C) 98.2 F (36.8 C)  TempSrc: Axillary  Oral Oral  Resp:  33  33  Height:      Weight:      SpO2:  100%  96%    Intake/Output Summary (Last 24 hours) at 05/13/15 1019 Last data filed at 05/13/15 0700  Gross per 24 hour  Intake 2789.05 ml  Output   1850 ml  Net 939.05 ml   Filed Weights   05/02/15 0400 05/09/15 0553 05/12/15 0923  Weight: 70.5 kg (155 lb 6.8 oz) 63.2 kg (139 lb 5.3 oz) 62.5 kg (137 lb 12.6 oz)    Exam:   General:  Very frail  Cardiovascular: sinus tachycardic, s1, s2  Respiratory: decreased  at basis, no wheezing, no rales, no rhonchi  Abdomen: obese, decreased BS, non tender  Musculoskeletal: perfused, no clubbing   Data Reviewed: Basic Metabolic Panel:  Recent Labs Lab 05/07/15 0400 05/08/15 0440 05/09/15 0400 05/10/15 0412 05/11/15 0305 05/12/15 0422 05/13/15 0530  NA 151* 152* 151* 149* 145 141 139  K 3.8 4.2 4.5 4.5 4.1 4.1 3.8  CL 123* 124* 124* 123* 118* 116* 115*  CO2 19* 19* 18* 18* 18* 16* 15*  GLUCOSE 227* 278* 282* 235* 254* 181* 173*  BUN 28* 29* 28* 29* 28* 28* 34*  CREATININE 1.75* 1.42* 1.40* 1.28* 1.18* 1.33* 1.35*  CALCIUM 9.1 9.2 10.3 10.3 10.6* 10.5* 10.1  MG 2.1 1.9 1.8 1.7  --   --  1.3*  PHOS 2.6 3.1 3.1 3.3  --   --  3.0   Liver Function Tests:  Recent Labs Lab 05/09/15 0400 05/13/15 0530  AST 48* 56*  ALT 26 39  ALKPHOS 141* 206*  BILITOT 0.8 0.8  PROT 5.7* 5.4*  ALBUMIN 1.7* 1.4*    Recent Labs Lab 05/12/15 0422  LIPASE 44   No results for input(s): AMMONIA in the last  168 hours. CBC:  Recent Labs Lab 05/07/15 0400 05/08/15 0440 05/10/15 0412 05/11/15 0500 05/13/15 0530  WBC 26.0* 25.0* 20.1* 20.9* 22.0*  NEUTROABS  --   --   --   --  18.1*  HGB 8.7* 7.9* 7.8* 8.1* 7.1*  HCT 25.8* 24.2* 24.1* 24.9* 21.0*  MCV 85.7 85.2 87.0 85.6 82.4  PLT 572* 545* 556* 558* 504*   Cardiac Enzymes: No results for input(s): CKTOTAL, CKMB, CKMBINDEX, TROPONINI in the last 168 hours. BNP (last 3 results) No results for input(s): BNP in the last 8760 hours.  ProBNP (last 3 results) No results for input(s): PROBNP in the last 8760 hours.  CBG:  Recent Labs Lab 05/12/15 1720 05/12/15 2008 05/12/15 2323 05/13/15 0440 05/13/15 0736  GLUCAP 204* 234* 202* 175* 145*    Recent Results (from the past 240 hour(s))  C difficile quick scan w PCR reflex     Status: None   Collection Time: 05/03/15 10:30 AM  Result Value Ref Range Status   C Diff antigen NEGATIVE NEGATIVE Final   C Diff toxin NEGATIVE NEGATIVE Final   C Diff interpretation Negative for toxigenic C. difficile  Corrected    Comment: CORRECTED ON 01/06 AT 1423: PREVIOUSLY REPORTED AS NEGATIVE  Culture, blood (routine x 2)     Status: None (Preliminary result)   Collection Time: 05/11/15 10:43 AM  Result Value Ref Range Status   Specimen Description BLOOD BLOOD LEFT HAND  Final   Special Requests BOTTLES DRAWN AEROBIC ONLY 6CC  Final   Culture   Final    NO GROWTH 1 DAY Performed at [facility]    Report Status PENDING  Incomplete  Culture, blood (routine x 2)     Status: None (Preliminary result)   Collection Time: 05/11/15 10:50 AM  Result Value Ref Range Status   Specimen Description BLOOD BLOOD LEFT HAND  Final   Special Requests IN PEDIATRIC BOTTLE 3CC  Final   Culture   Final    NO GROWTH 1 DAY Performed at [facility]    Report Status PENDING  Incomplete  Gram stain     Status: None   Collection Time: 05/12/15 12:43 PM  Result Value Ref Range Status   Specimen  Description FLUID PERITONEAL  Final   Special Requests NONE  Final   Gram Stain   Final    FEW WBC PRESENT, PREDOMINANTLY MONONUCLEAR NO ORGANISMS SEEN Performed at [facility]    Report Status 05/12/2015 FINAL  Final     Studies: Ct Abdomen Pelvis Wo Contrast  05/11/2015  CLINICAL DATA:  Severe pancreatitis. Intubated. Aspirated. Septic shock. EXAM: CT CHEST, ABDOMEN AND PELVIS WITHOUT CONTRAST TECHNIQUE: Multidetector CT imaging of the chest, abdomen and pelvis was performed following the standard protocol without IV contrast. COMPARISON:  CT abdomen pelvis 04/30/18 17 FINDINGS: CT CHEST FINDINGS Mediastinum/Nodes: RIGHT PICC line with tip in the distal SVC/RIGHT atrium. No axillary or supraclavicular adenopathy. No mediastinal hilar adenopathy. No pericardial fluid. Esophagus normal. Lungs/Pleura: There are small bilateral pleural effusions. There is bibasilar atelectasis with air bronchograms. Cannot exclude small foci of pneumonia or aspiration pneumonitis. Findings have improved from comparison CT. Musculoskeletal: No aggressive osseous lesion. CT ABDOMEN AND PELVIS FINDINGS Hepatobiliary: No focal hepatic lesions noncontrast exam. No biliary duct dilatation. Gallbladder is poorly defined. Several gallstones are noted within the gallbladder which is nondistended. Pancreas: There is continued increase edematous enlargement of the body and head of the pancreas with poor definition and low attenuation suggesting pancreatic necrosis. The pancreatic thickening measures 5.4 cm (image 56, series 2) compared to 3.8 cm on prior. The tail the pancreas is normal densities and similar prior. No pancreatic head is poorly defined on the background of edema and fluid. There is interval increase in fluid along the RIGHT pericolic gutter with the collection measures 7.5 x 6.1 cm (image 72, series 2) increased from 6.4 x 3.6 cm. Increase fluid along the LEFT pericolic gutter which is less well organized.  Spleen: Normal spleen Adrenals/urinary tract: Adrenal glands and kidneys are normal. Foley catheter within the bladder. Stomach/Bowel: The stomach, small bowel, and colon are normal. No evidence of bowel obstruction or pneumatosis pneumatosis. No IV oral contrast administered. Fluid stool rectum. Vascular/Lymphatic: Abdominal aorta is normal caliber. There is no retroperitoneal or periportal lymphadenopathy. No pelvic lymphadenopathy. Reproductive: Uterus there is normal. Other: No free fluid. Musculoskeletal: No aggressive osseous lesion. IMPRESSION: 1. Continued concern for pancreatic necrosis with poor definition of the head and body the pancreas with increased low-attenuation edema compared to prior exam. 2. Increased fluid along the pericolic gutters. The are no clear organized pseudocyst at this point. The RIGHT pericolic gutter collection is the most organized. 3. Some improvement in bibasilar atelectasis and pleural effusions. There are bronchograms which could indicate infection. 4. No bowel obstruction. 5. Cholelithiasis within collapsed gallbladder. Electronically Signed   By: Genevive Bi M.D.   On: 05/11/2015 18:57   Ct Chest Wo Contrast  05/11/2015  CLINICAL DATA:  Severe pancreatitis. Intubated. Aspirated. Septic shock. EXAM: CT CHEST, ABDOMEN AND PELVIS WITHOUT CONTRAST TECHNIQUE: Multidetector CT imaging of the chest, abdomen and pelvis was performed following the standard protocol without IV contrast. COMPARISON:  CT abdomen pelvis 04/30/18 17 FINDINGS: CT CHEST FINDINGS Mediastinum/Nodes: RIGHT PICC line with tip in the distal SVC/RIGHT atrium. No axillary or supraclavicular adenopathy. No mediastinal hilar adenopathy. No pericardial fluid. Esophagus normal. Lungs/Pleura: There are small bilateral pleural effusions. There is bibasilar atelectasis with air bronchograms. Cannot exclude small foci of pneumonia or aspiration pneumonitis. Findings have improved from comparison CT.  Musculoskeletal: No aggressive osseous lesion. CT ABDOMEN AND PELVIS FINDINGS Hepatobiliary: No focal hepatic lesions noncontrast exam. No biliary duct dilatation. Gallbladder is poorly defined. Several gallstones are noted within the gallbladder which is nondistended. Pancreas: There is continued increase edematous enlargement  of the body and head of the pancreas with poor definition and low attenuation suggesting pancreatic necrosis. The pancreatic thickening measures 5.4 cm (image 56, series 2) compared to 3.8 cm on prior. The tail the pancreas is normal densities and similar prior. No pancreatic head is poorly defined on the background of edema and fluid. There is interval increase in fluid along the RIGHT pericolic gutter with the collection measures 7.5 x 6.1 cm (image 72, series 2) increased from 6.4 x 3.6 cm. Increase fluid along the LEFT pericolic gutter which is less well organized. Spleen: Normal spleen Adrenals/urinary tract: Adrenal glands and kidneys are normal. Foley catheter within the bladder. Stomach/Bowel: The stomach, small bowel, and colon are normal. No evidence of bowel obstruction or pneumatosis pneumatosis. No IV oral contrast administered. Fluid stool rectum. Vascular/Lymphatic: Abdominal aorta is normal caliber. There is no retroperitoneal or periportal lymphadenopathy. No pelvic lymphadenopathy. Reproductive: Uterus there is normal. Other: No free fluid. Musculoskeletal: No aggressive osseous lesion. IMPRESSION: 1. Continued concern for pancreatic necrosis with poor definition of the head and body the pancreas with increased low-attenuation edema compared to prior exam. 2. Increased fluid along the pericolic gutters. The are no clear organized pseudocyst at this point. The RIGHT pericolic gutter collection is the most organized. 3. Some improvement in bibasilar atelectasis and pleural effusions. There are bronchograms which could indicate infection. 4. No bowel obstruction. 5.  Cholelithiasis within collapsed gallbladder. Electronically Signed   By: Genevive Bi M.D.   On: 05/11/2015 18:57   US Paracentesis  05/12/2015  CLINICAL DATA:  Peritoneal fluid.  Pancreatitis. EXAM: ULTRASOUND GUIDED PARACENTESIS TECHNIQUE: Survey ultrasound of the abdomen was performed and an appropriate skin entry site in the right lower abdomen was selected. Skin site was marked, prepped with Betadine, and draped in usual sterile fashion, and infiltrated locally with 1% lidocaine. A 5 French multisidehole Yueh sheath needle was advanced into the peritoneal space until fluid could be aspirated. The sheath was advanced and the needle removed. 112 cc of cloudy dark ascites were aspirated. COMPLICATIONS: Nine IMPRESSION: Technically successful ultrasound guided paracentesis, removing 112 cc of ascites. Electronically Signed   By: Jolaine Click M.D.   On: 05/12/2015 14:04   Dg Chest Port 1 View  05/11/2015  CLINICAL DATA:  Cough today.  Fever.  Follow-up exam. EXAM: PORTABLE CHEST 1 VIEW COMPARISON:  05/09/2015. FINDINGS: There has been an interval increase in right lung base opacity. Although this likely atelectasis, pneumonia is possible. Mild persistent opacity at the left lung base is consistent stable atelectasis. No evidence of pulmonary edema. No convincing pleural effusion no pneumothorax. The right PICC is stable with its tip at the caval atrial junction. IMPRESSION: Increased right lung base opacity may reflect increased atelectasis, pneumonia or a combination. No other change. Electronically Signed   By: Amie Portland M.D.   On: 05/11/2015 11:51    Scheduled Meds: . sodium chloride   Intravenous Once  . antiseptic oral rinse  7 mL Mouth Rinse q12n4p  . aspirin  81 mg Oral Daily  . chlorhexidine  15 mL Mouth Rinse BID  . famotidine (PEPCID) IV  20 mg Intravenous Q24H  . fluconazole (DIFLUCAN) IV  100 mg Intravenous Q24H  . heparin  5,000 Units Subcutaneous 3 times per day  . insulin  aspart  0-20 Units Subcutaneous 6 times per day  . insulin glargine  40 Units Subcutaneous BID  . magnesium sulfate 1 - 4 g bolus IVPB  2 g Intravenous Once  .  meropenem (MERREM) IV  1 g Intravenous Q12H  . metoprolol  5 mg Intravenous 3 times per day  . sodium chloride  10-40 mL Intracatheter Q12H  . sodium chloride  3 mL Intravenous Q12H   Continuous Infusions: . dextrose 50 mL/hr at 05/13/15 0258  . TPN (CLINIMIX) Adult without lytes 55 mL/hr at 05/12/15 1712   And  . fat emulsion 192 mL (05/12/15 1712)    Principal Problem:   Acute necrotizing pancreatitis with SIRS Active Problems:   Gallstones   Transaminitis   DM type 2 (diabetes mellitus, type 2) (HCC)   Hyperlipidemia   ARF (acute renal failure) (HCC)   Acute respiratory failure with hypoxia (HCC)   Aspiration pneumonia due to vomit (HCC)   Acute on chronic respiratory failure (HCC)   Protein-calorie malnutrition, moderate (HCC)   Umbilical hernia   Chronic kidney disease (CKD), stage III (moderate)   Olivopontocerebellar atrophy (HCC)   Abscess   Fever   Necrotizing pancreatitis    Delania Ferg MD PhD  [facility] Pager [phone] If 7PM-7AM, please contact night-coverage at [email or web address], password [facility] 05/13/2015, 10:19 AM  LOS: 15 days

## 2015-05-13 NOTE — Progress Notes (Signed)
Physical Therapy Treatment Patient Details Name: Victoria Morrow MRN: [id number] DOB: 03/25/1951 [address] [facility] Date: 05/13/2015    History of Present Illness 65 yo female admitted 04/28/15 with necrotizing pancreatitis. required ventilator  for aspiration. Extubated 1/5.    PT Comments    Patient is very weak, unable to stand today.  Assisted to recliner  Via lift.  Follow Up Recommendations  SNF;Supervision/Assistance - 24 hour     Equipment Recommendations  None recommended by PT    Recommendations for Other Services       Precautions / Restrictions Precautions Precautions: Fall Precaution Comments: multi lines, monitor VS Restrictions Weight Bearing Restrictions: No    Mobility  Bed Mobility Overal bed mobility: Needs Assistance;+2 for physical assistance;+ 2 for safety/equipment Bed Mobility: Supine to Sit Rolling: Total assist;+2 for physical assistance;+2 for safety/equipment         General bed mobility comments: patient is requiring much more assistance today, does not  really assist, very floppy inarms and legs and trunk.  Transfers Overall transfer level: Needs assistance               General transfer comment: used [place] as patient was unable to self support in sitting and deemed too weak, also tachy with HR 136 during minimal;  mobility other than assisting to sitting. RR also 40  RN aware.  Ambulation/Gait                 Stairs            Wheelchair Mobility    Modified Rankin (Stroke Patients Only)       Balance Overall balance assessment: Needs assistance Sitting-balance support: Feet unsupported Sitting balance-Leahy Scale: Zero Sitting balance - Comments: poor balance responses                            Cognition Arousal/Alertness: Lethargic   Overall Cognitive Status: Impaired/Different from baseline                      Exercises      General Comments        Pertinent Vitals/Pain Pain  Assessment: No/denies pain    Home Living                      Prior Function            PT Goals (current goals can now be found in the care plan section) Progress towards PT goals: Not progressing toward goals - comment (is muchweaker, less aroused.)    Frequency  Min 3X/week    PT Plan Current plan remains appropriate    Co-evaluation             End of Session   Activity Tolerance: Patient limited by fatigue Patient left: in bed;with call bell/phone within reach;with chair alarm set     Time: [id number] PT Time Calculation (min) (ACUTE ONLY): 20 min  Charges:  $Therapeutic Activity: 8-22 mins                    G Codes:      Rada HayHill, Drey Shaff Elizabeth 05/13/2015, 11:40 AM Blanchard KelchKaren Karren Newland PT [phone]

## 2015-05-13 NOTE — Progress Notes (Addendum)
Pt moved back to the bed from the bedside chair using the lift. Will continue to monitor.

## 2015-05-13 NOTE — Progress Notes (Signed)
Pharmacy Antibiotic Follow-up Note  Victoria Morrow is a 65 y.o. year-old female admitted on 04/28/2015.  The patient is currently on day 12 of meropenem (Day 16 of active abx) for necrotizing pancreatitis.  Assessment/Plan: Temp: new fevers to 102.1 this AM WBC: stable around 20k Renal: SCr stable; CrCl 36 ml/min CG   This patient's current antibiotics will be continued without adjustments.   Diflucan added per MD for and yeast in UA   Discussed duration of therapy with Dr. Roda ShuttersXu over the weekend. The guidelines for management of severe acute pancreatitis recommend 14 days after source control is obtained, but CT yesterday showing increasing fluid so sounds like sounds like source is not controlled.  Per Surgery, surgical intervention only considered as last resort   Temp (24hrs), Avg:99.7 F (37.6 C), Min:98.2 F (36.8 C), Max:102.1 F (38.9 C)   Recent Labs Lab 05/07/15 0400 05/08/15 0440 05/10/15 0412 05/11/15 0500 05/13/15 0530  WBC 26.0* 25.0* 20.1* 20.9* 22.0*    Recent Labs Lab 05/09/15 0400 05/10/15 0412 05/11/15 0305 05/12/15 0422 05/13/15 0530  CREATININE 1.40* 1.28* 1.18* 1.33* 1.35*   Estimated Creatinine Clearance: 35.7 mL/min (by C-G formula based on Cr of 1.35).    No Known Allergies  Antimicrobials this admission: 1/1 >> Primaxin  >> 1/3 1/3 >> Rocephin >> 1/4 1/3 >> Flagyl >> 1/5 1/4 >> Primaxin >> 1/5 1/5 >> meropenem >>  1/6 >> PO vanc >> 1/7 1/15 >> Fluconazole (MD) >>  Microbiology Results: 1/1 BCx: NGF 1/1 MRSA PCR: neg 1/2 urine: >100k Klebsiella pneumo (R-amp) 1/5 blood: 1/2 NGF, other canceled 1/5 trach asp: NGF 1/6 C diff: neg 1/14 blood (repeat): NGTD 1/15 peritoneal fluid: NGTD   Thank you for allowing pharmacy to be a part of this patient's care.  Bernadene Personrew Hilmer Aliberti, PharmD, BCPS Pager: [phone] 05/13/2015, 12:13 PM

## 2015-05-13 NOTE — Progress Notes (Signed)
Patient ID: Victoria Morrow, female   DOB: May 30, 1950, 65 y.o.   MRN: [id number]     [facility]      [address] [place], [place] [address]    Phone: [phone] FAX: [phone]     Subjective: Tachypnic. Febrile 102.1. Denies pain.    Objective:  Vital signs:  Filed Vitals:   05/13/15 0000 05/13/15 0010 05/13/15 0400 05/13/15 0430  BP: 95/52  92/51   Pulse:   123   Temp:  102.1 F (38.9 C)  98.4 F (36.9 C)  TempSrc:  Axillary  Oral  Resp: 32  33   Height:      Weight:      SpO2: 100%  100%     Last BM Date: 05/12/15  Intake/Output   Yesterday:  01/15 0701 - 01/16 0700 In: 2826.1 [I.V.:1150; IV Piggyback:100; TPN:1576.1] Out: 1850 [Urine:1850] This shift:     Physical Exam: General: Pt awake/alert/oriented x4 in no acute distress  Abdomen: Soft.  Nondistended.  Non tender.  No evidence of peritonitis.  No incarcerated hernias.    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HCC)   Abscess   Fever   Necrotizing pancreatitis    Results:   Labs: Results for orders placed or performed during the hospital encounter of 04/28/15 (from the past 48 hour(s))  Glucose, capillary     Status: Abnormal   Collection Time: 05/11/15  8:02 AM  Result Value Ref Range   Glucose-Capillary 188 (H) 65 - 99 mg/dL   Comment 1 Notify RN    Comment 2 Document in Chart   Culture, blood (routine x 2)     Status: None (Preliminary result)   Collection Time:  05/11/15 10:43 AM  Result Value Ref Range   Specimen Description BLOOD BLOOD LEFT HAND    Special Requests BOTTLES DRAWN AEROBIC ONLY 6CC    Culture      NO GROWTH 1 DAY Performed at [facility]    Report Status PENDING   Culture, blood (routine x 2)     Status: None (Preliminary result)   Collection Time: 05/11/15 10:50 AM  Result Value Ref Range   Specimen Description BLOOD BLOOD LEFT HAND    Special Requests IN PEDIATRIC BOTTLE 3CC    Culture      NO GROWTH 1 DAY Performed at [facility]    Report Status PENDING   Glucose, capillary     Status: Abnormal   Collection Time: 05/11/15 12:02 PM  Result Value Ref Range   Glucose-Capillary 226 (H) 65 - 99 mg/dL  Urinalysis, Routine w reflex microscopic (not at [facility])     Status: Abnormal   Collection Time: 05/11/15  1:44 PM  Result Value Ref Range   Color, Urine YELLOW YELLOW   APPearance CLEAR CLEAR   Specific Gravity, Urine 1.015 1.005 - 1.030   pH 7.0 5.0 - 8.0   Glucose, UA 100 (A) NEGATIVE mg/dL   Hgb urine dipstick SMALL (  A) NEGATIVE   Bilirubin Urine NEGATIVE NEGATIVE   Ketones, ur NEGATIVE NEGATIVE mg/dL   Protein, ur 30 (A) NEGATIVE mg/dL   Nitrite NEGATIVE NEGATIVE   Leukocytes, UA SMALL (A) NEGATIVE  Urine microscopic-add on     Status: None   Collection Time: 05/11/15  1:44 PM  Result Value Ref Range   Squamous Epithelial / LPF NONE SEEN NONE SEEN   WBC, UA 6-30 0 - 5 WBC/hpf   RBC / HPF 0-5 0 - 5 RBC/hpf   Bacteria, UA NONE SEEN NONE SEEN   Urine-Other YEAST PRESENT   Glucose, capillary     Status: Abnormal   Collection Time: 05/11/15  4:27 PM  Result Value Ref Range   Glucose-Capillary 198 (H) 65 - 99 mg/dL   Comment 1 Notify RN    Comment 2 Document in Chart   Glucose, capillary     Status: Abnormal   Collection Time: 05/11/15  8:28 PM  Result Value Ref Range   Glucose-Capillary 224 (H) 65 - 99 mg/dL  Glucose, capillary     Status: Abnormal   Collection Time: 05/11/15 11:39 PM  Result  Value Ref Range   Glucose-Capillary 212 (H) 65 - 99 mg/dL  Basic metabolic panel     Status: Abnormal   Collection Time: 05/12/15  4:22 AM  Result Value Ref Range   Sodium 141 135 - 145 mmol/L   Potassium 4.1 3.5 - 5.1 mmol/L   Chloride 116 (H) 101 - 111 mmol/L   CO2 16 (L) 22 - 32 mmol/L   Glucose, Bld 181 (H) 65 - 99 mg/dL   BUN 28 (H) 6 - 20 mg/dL   Creatinine, Ser 1.33 (H) 0.44 - 1.00 mg/dL   Calcium 10.5 (H) 8.9 - 10.3 mg/dL   GFR calc non Af Amer 41 (L) >60 mL/min   GFR calc Af Amer 47 (L) >60 mL/min    Comment: (NOTE) The eGFR has been calculated using the CKD EPI equation. This calculation has not been validated in all clinical situations. eGFR's persistently <60 mL/min signify possible Chronic Kidney Disease.    Anion gap 9 5 - 15  Lipase, blood     Status: None   Collection Time: 05/12/15  4:22 AM  Result Value Ref Range   Lipase 44 11 - 51 U/L  Glucose, capillary     Status: Abnormal   Collection Time: 05/12/15  4:34 AM  Result Value Ref Range   Glucose-Capillary 183 (H) 65 - 99 mg/dL  Glucose, capillary     Status: Abnormal   Collection Time: 05/12/15  7:30 AM  Result Value Ref Range   Glucose-Capillary 164 (H) 65 - 99 mg/dL  Protime-INR     Status: None   Collection Time: 05/12/15 10:44 AM  Result Value Ref Range   Prothrombin Time 15.2 11.6 - 15.2 seconds   INR 1.18 0.00 - 1.49  Gram stain     Status: None   Collection Time: 05/12/15 12:43 PM  Result Value Ref Range   Specimen Description FLUID PERITONEAL    Special Requests NONE    Gram Stain      FEW WBC PRESENT, PREDOMINANTLY MONONUCLEAR NO ORGANISMS SEEN Performed at [facility]    Report Status 05/12/2015 FINAL   Albumin, pleural or peritoneal fluid     Status: None   Collection Time: 05/12/15 12:57 PM  Result Value Ref Range   Albumin, Fluid <1.0 g/dL    Comment: (NOTE) No normal range established for  this test Results should be evaluated in conjunction with serum values Performed  at [place]: CORRECTED ON 01/15 AT 1312: PREVIOUSLY REPORTED AS Peritoneal  Lactate dehydrogenase (CSF, pleural or peritoneal fluid)     Status: Abnormal   Collection Time: 05/12/15 12:57 PM  Result Value Ref Range   LD, Fluid 5826 (H) 3 - 23 U/L    Comment: RESULTS CONFIRMED BY MANUAL DILUTION (NOTE) Results should be evaluated in conjunction with serum values Performed at [facility]    Fluid Type-FLDH PERITONEAL     Comment: CORRECTED ON 01/15 AT 1313: PREVIOUSLY REPORTED AS Peritoneal  Protein, pleural or peritoneal fluid     Status: None   Collection Time: 05/12/15 12:57 PM  Result Value Ref Range   Total protein, fluid <3.0 g/dL    Comment: (NOTE) No normal range established for this test Results should be evaluated in conjunction with serum values Performed at [facility]    Fluid Type-FTP PERITONEAL     Comment: CORRECTED ON 01/15 AT 1313: PREVIOUSLY REPORTED AS Peritoneal  Triglycerides, Body Fluid     Status: None   Collection Time: 05/12/15 12:57 PM  Result Value Ref Range   Triglycerides, Fluid 75 mg/dL    Comment: (NOTE) ________________________________________________________ :  Peritoneal  :       Pleural          :   Synovial     : :______________:________________________:________________: :              : Transudate :  Exudate  :                : :______________:____________:___________:________________: :  Not Estab.  : Not Estab. : Not Estab.:   Not Estab.   : :______________:____________:___________:________________: The method performance specifications have not been established for this test in body fluid. The test result should be integrated into the clinical context for interpretation. The reference intervals and other method performance specifications have not been established for this test. The test result should be integrated into the clinical context for  interpretation. Performed At: [facility] [place], [place] [id number] Lindon Romp MD JZ:[id number]    Fluid Type-FTRIG PERITONEAL     Comment: CORRECTED ON 01/15 AT 1313: PREVIOUSLY REPORTED AS Peritoneal  Body fluid cell count with differential     Status: Abnormal   Collection Time: 05/12/15 12:57 PM  Result Value Ref Range   Fluid Type-FCT PERITONEAL     Comment: CORRECTED ON 01/15 AT 1313: PREVIOUSLY REPORTED AS Peritoneal   Color, Fluid BROWN (A) YELLOW   Appearance, Fluid TURBID (A) CLEAR   WBC, Fluid 6 0 - 1000 cu mm   Other Cells, Fluid TOO FEW TO COUNT, SMEAR AVAILABLE FOR REVIEW %    Comment: FEW NEUTROPHILS SEEN  Glucose, pleural or peritoneal fluid     Status: None   Collection Time: 05/12/15 12:57 PM  Result Value Ref Range   Glucose, Fluid 179 mg/dL    Comment: (NOTE) No normal range established for this test Results should be evaluated in conjunction with serum values Performed at [place]: CORRECTED ON 01/15 AT 1314: PREVIOUSLY REPORTED AS Peritoneal  Amylase, Peritoneal Fluid     Status: None   Collection Time: 05/12/15 12:57 PM  Result Value Ref Range   Amylase, peritoneal fluid >10000 U/L  Comment: (NOTE) Result confirmed by automatic dilution. Reference range: Values greater than or equal to three times a simultaneously analyzed serum value are considered abnormal in ascites of pancreatic origin. Other peritoneal and intestional disorders may also cause elevation of amylase. Performed at [facility]   Glucose, capillary     Status: Abnormal   Collection Time: 05/12/15  1:14 PM  Result Value Ref Range   Glucose-Capillary 195 (H) 65 - 99 mg/dL  Glucose, capillary     Status: Abnormal   Collection Time: 05/12/15  5:20 PM  Result Value Ref Range   Glucose-Capillary 204 (H) 65 - 99 mg/dL  Glucose, capillary     Status: Abnormal   Collection Time: 05/12/15  8:08  PM  Result Value Ref Range   Glucose-Capillary 234 (H) 65 - 99 mg/dL   Comment 1 Notify RN    Comment 2 Document in Chart   Glucose, capillary     Status: Abnormal   Collection Time: 05/12/15 11:23 PM  Result Value Ref Range   Glucose-Capillary 202 (H) 65 - 99 mg/dL  Glucose, capillary     Status: Abnormal   Collection Time: 05/13/15  4:40 AM  Result Value Ref Range   Glucose-Capillary 175 (H) 65 - 99 mg/dL   Comment 1 Notify RN    Comment 2 Document in Chart   Comprehensive metabolic panel     Status: Abnormal   Collection Time: 05/13/15  5:30 AM  Result Value Ref Range   Sodium 139 135 - 145 mmol/L   Potassium 3.8 3.5 - 5.1 mmol/L   Chloride 115 (H) 101 - 111 mmol/L   CO2 15 (L) 22 - 32 mmol/L   Glucose, Bld 173 (H) 65 - 99 mg/dL   BUN 34 (H) 6 - 20 mg/dL   Creatinine, Ser 1.35 (H) 0.44 - 1.00 mg/dL   Calcium 10.1 8.9 - 10.3 mg/dL   Total Protein 5.4 (L) 6.5 - 8.1 g/dL   Albumin 1.4 (L) 3.5 - 5.0 g/dL   AST 56 (H) 15 - 41 U/L   ALT 39 14 - 54 U/L   Alkaline Phosphatase 206 (H) 38 - 126 U/L   Total Bilirubin 0.8 0.3 - 1.2 mg/dL   GFR calc non Af Amer 40 (L) >60 mL/min   GFR calc Af Amer 46 (L) >60 mL/min    Comment: (NOTE) The eGFR has been calculated using the CKD EPI equation. This calculation has not been validated in all clinical situations. eGFR's persistently <60 mL/min signify possible Chronic Kidney Disease.    Anion gap 9 5 - 15  Magnesium     Status: Abnormal   Collection Time: 05/13/15  5:30 AM  Result Value Ref Range   Magnesium 1.3 (L) 1.7 - 2.4 mg/dL  Phosphorus     Status: None   Collection Time: 05/13/15  5:30 AM  Result Value Ref Range   Phosphorus 3.0 2.5 - 4.6 mg/dL  CBC     Status: Abnormal   Collection Time: 05/13/15  5:30 AM  Result Value Ref Range   WBC 22.0 (H) 4.0 - 10.5 K/uL   RBC 2.55 (L) 3.87 - 5.11 MIL/uL   Hemoglobin 7.1 (L) 12.0 - 15.0 g/dL   HCT 21.0 (L) 36.0 - 46.0 %   MCV 82.4 78.0 - 100.0 fL   MCH 27.8 26.0 - 34.0 pg   MCHC  33.8 30.0 - 36.0 g/dL   RDW 12.9 11.5 - 15.5 %   Platelets 504 (H) 150 -  400 K/uL  Differential     Status: Abnormal   Collection Time: 05/13/15  5:30 AM  Result Value Ref Range   Neutrophils Relative % 82 %   Lymphocytes Relative 11 %   Monocytes Relative 7 %   Eosinophils Relative 0 %   Basophils Relative 0 %   Neutro Abs 18.1 (H) 1.7 - 7.7 K/uL   Lymphs Abs 2.4 0.7 - 4.0 K/uL   Monocytes Absolute 1.5 (H) 0.1 - 1.0 K/uL   Eosinophils Absolute 0.0 0.0 - 0.7 K/uL   Basophils Absolute 0.0 0.0 - 0.1 K/uL   RBC Morphology POLYCHROMASIA PRESENT   Triglycerides     Status: None   Collection Time: 05/13/15  5:30 AM  Result Value Ref Range   Triglycerides 130 <150 mg/dL    Comment: Performed at [facility]    Imaging / Studies: Ct Abdomen Pelvis Wo Contrast  05/11/2015  CLINICAL DATA:  Severe pancreatitis. Intubated. Aspirated. Septic shock. EXAM: CT CHEST, ABDOMEN AND PELVIS WITHOUT CONTRAST TECHNIQUE: Multidetector CT imaging of the chest, abdomen and pelvis was performed following the standard protocol without IV contrast. COMPARISON:  CT abdomen pelvis 04/30/18 17 FINDINGS: CT CHEST FINDINGS Mediastinum/Nodes: RIGHT PICC line with tip in the distal SVC/RIGHT atrium. No axillary or supraclavicular adenopathy. No mediastinal hilar adenopathy. No pericardial fluid. Esophagus normal. Lungs/Pleura: There are small bilateral pleural effusions. There is bibasilar atelectasis with air bronchograms. Cannot exclude small foci of pneumonia or aspiration pneumonitis. Findings have improved from comparison CT. Musculoskeletal: No aggressive osseous lesion. CT ABDOMEN AND PELVIS FINDINGS Hepatobiliary: No focal hepatic lesions noncontrast exam. No biliary duct dilatation. Gallbladder is poorly defined. Several gallstones are noted within the gallbladder which is nondistended. Pancreas: There is continued increase edematous enlargement of the body and head of the pancreas with poor definition and low  attenuation suggesting pancreatic necrosis. The pancreatic thickening measures 5.4 cm (image 56, series 2) compared to 3.8 cm on prior. The tail the pancreas is normal densities and similar prior. No pancreatic head is poorly defined on the background of edema and fluid. There is interval increase in fluid along the RIGHT pericolic gutter with the collection measures 7.5 x 6.1 cm (image 72, series 2) increased from 6.4 x 3.6 cm. Increase fluid along the LEFT pericolic gutter which is less well organized. Spleen: Normal spleen Adrenals/urinary tract: Adrenal glands and kidneys are normal. Foley catheter within the bladder. Stomach/Bowel: The stomach, small bowel, and colon are normal. No evidence of bowel obstruction or pneumatosis pneumatosis. No IV oral contrast administered. Fluid stool rectum. Vascular/Lymphatic: Abdominal aorta is normal caliber. There is no retroperitoneal or periportal lymphadenopathy. No pelvic lymphadenopathy. Reproductive: Uterus there is normal. Other: No free fluid. Musculoskeletal: No aggressive osseous lesion. IMPRESSION: 1. Continued concern for pancreatic necrosis with poor definition of the head and body the pancreas with increased low-attenuation edema compared to prior exam. 2. Increased fluid along the pericolic gutters. The are no clear organized pseudocyst at this point. The RIGHT pericolic gutter collection is the most organized. 3. Some improvement in bibasilar atelectasis and pleural effusions. There are bronchograms which could indicate infection. 4. No bowel obstruction. 5. Cholelithiasis within collapsed gallbladder. Electronically Signed   By: Suzy Bouchard M.D.   On: 05/11/2015 18:57   Ct Chest Wo Contrast  05/11/2015  CLINICAL DATA:  Severe pancreatitis. Intubated. Aspirated. Septic shock. EXAM: CT CHEST, ABDOMEN AND PELVIS WITHOUT CONTRAST TECHNIQUE: Multidetector CT imaging of the chest, abdomen and pelvis was performed following the standard  protocol without IV  contrast. COMPARISON:  CT abdomen pelvis 04/30/18 17 FINDINGS: CT CHEST FINDINGS Mediastinum/Nodes: RIGHT PICC line with tip in the distal SVC/RIGHT atrium. No axillary or supraclavicular adenopathy. No mediastinal hilar adenopathy. No pericardial fluid. Esophagus normal. Lungs/Pleura: There are small bilateral pleural effusions. There is bibasilar atelectasis with air bronchograms. Cannot exclude small foci of pneumonia or aspiration pneumonitis. Findings have improved from comparison CT. Musculoskeletal: No aggressive osseous lesion. CT ABDOMEN AND PELVIS FINDINGS Hepatobiliary: No focal hepatic lesions noncontrast exam. No biliary duct dilatation. Gallbladder is poorly defined. Several gallstones are noted within the gallbladder which is nondistended. Pancreas: There is continued increase edematous enlargement of the body and head of the pancreas with poor definition and low attenuation suggesting pancreatic necrosis. The pancreatic thickening measures 5.4 cm (image 56, series 2) compared to 3.8 cm on prior. The tail the pancreas is normal densities and similar prior. No pancreatic head is poorly defined on the background of edema and fluid. There is interval increase in fluid along the RIGHT pericolic gutter with the collection measures 7.5 x 6.1 cm (image 72, series 2) increased from 6.4 x 3.6 cm. Increase fluid along the LEFT pericolic gutter which is less well organized. Spleen: Normal spleen Adrenals/urinary tract: Adrenal glands and kidneys are normal. Foley catheter within the bladder. Stomach/Bowel: The stomach, small bowel, and colon are normal. No evidence of bowel obstruction or pneumatosis pneumatosis. No IV oral contrast administered. Fluid stool rectum. Vascular/Lymphatic: Abdominal aorta is normal caliber. There is no retroperitoneal or periportal lymphadenopathy. No pelvic lymphadenopathy. Reproductive: Uterus there is normal. Other: No free fluid. Musculoskeletal: No aggressive osseous lesion.  IMPRESSION: 1. Continued concern for pancreatic necrosis with poor definition of the head and body the pancreas with increased low-attenuation edema compared to prior exam. 2. Increased fluid along the pericolic gutters. The are no clear organized pseudocyst at this point. The RIGHT pericolic gutter collection is the most organized. 3. Some improvement in bibasilar atelectasis and pleural effusions. There are bronchograms which could indicate infection. 4. No bowel obstruction. 5. Cholelithiasis within collapsed gallbladder. Electronically Signed   By: Suzy Bouchard M.D.   On: 05/11/2015 18:57   US Paracentesis  05/12/2015  CLINICAL DATA:  Peritoneal fluid.  Pancreatitis. EXAM: ULTRASOUND GUIDED PARACENTESIS TECHNIQUE: Survey ultrasound of the abdomen was performed and an appropriate skin entry site in the right lower abdomen was selected. Skin site was marked, prepped with Betadine, and draped in usual sterile fashion, and infiltrated locally with 1% lidocaine. A 5 French multisidehole Yueh sheath needle was advanced into the peritoneal space until fluid could be aspirated. The sheath was advanced and the needle removed. 112 cc of cloudy dark ascites were aspirated. COMPLICATIONS: Nine IMPRESSION: Technically successful ultrasound guided paracentesis, removing 112 cc of ascites. Electronically Signed   By: Marybelle Killings M.D.   On: 05/12/2015 14:04   Dg Chest Port 1 View  05/11/2015  CLINICAL DATA:  Cough today.  Fever.  Follow-up exam. EXAM: PORTABLE CHEST 1 VIEW COMPARISON:  05/09/2015. FINDINGS: There has been an interval increase in right lung base opacity. Although this likely atelectasis, pneumonia is possible. Mild persistent opacity at the left lung base is consistent stable atelectasis. No evidence of pulmonary edema. No convincing pleural effusion no pneumothorax. The right PICC is stable with its tip at the caval atrial junction. IMPRESSION: Increased right lung base opacity may reflect increased  atelectasis, pneumonia or a combination. No other change. Electronically Signed   By: Dedra Skeens.D.  On: 05/11/2015 11:51    Medications / Allergies:  Scheduled Meds: . antiseptic oral rinse  7 mL Mouth Rinse q12n4p  . aspirin  81 mg Oral Daily  . chlorhexidine  15 mL Mouth Rinse BID  . famotidine (PEPCID) IV  20 mg Intravenous Q24H  . fluconazole (DIFLUCAN) IV  100 mg Intravenous Q24H  . heparin  5,000 Units Subcutaneous 3 times per day  . insulin aspart  0-20 Units Subcutaneous 6 times per day  . insulin glargine  40 Units Subcutaneous BID  . meropenem (MERREM) IV  1 g Intravenous Q12H  . metoprolol  5 mg Intravenous 3 times per day  . sodium chloride  10-40 mL Intracatheter Q12H  . sodium chloride  3 mL Intravenous Q12H   Continuous Infusions: . dextrose 50 mL/hr at 05/13/15 0258  . TPN (CLINIMIX) Adult without lytes 55 mL/hr at 05/12/15 1712   And  . fat emulsion 192 mL (05/12/15 1712)   PRN Meds:.acetaminophen **OR** acetaminophen, bismuth subsalicylate, diphenhydrAMINE, fentaNYL (SUBLIMAZE) injection, hydrALAZINE, levalbuterol, metoprolol, ondansetron (ZOFRAN) IV **OR** ondansetron (ZOFRAN) IV, promethazine, sodium chloride  Antibiotics: Anti-infectives    Start     Dose/Rate Route Frequency Ordered Stop   05/13/15 1000  fluconazole (DIFLUCAN) IVPB 100 mg     100 mg 50 mL/hr over 60 Minutes Intravenous Every 24 hours 05/12/15 0813 05/17/15 0959   05/12/15 1000  fluconazole (DIFLUCAN) IVPB 200 mg     200 mg 100 mL/hr over 60 Minutes Intravenous  Once 05/12/15 0813 05/12/15 1029   05/08/15 2200  meropenem (MERREM) 1 g in sodium chloride 0.9 % 100 mL IVPB     1 g 200 mL/hr over 30 Minutes Intravenous Every 12 hours 05/08/15 1054     05/03/15 1200  vancomycin (VANCOCIN) 50 mg/mL oral solution 500 mg  Status:  Discontinued     500 mg Oral 4 times per day 05/03/15 0917 05/04/15 1306   05/02/15 1200  cefTRIAXone (ROCEPHIN) 1 g in dextrose 5 % 50 mL IVPB  Status:   Discontinued     1 g 100 mL/hr over 30 Minutes Intravenous Every 24 hours 05/01/15 1448 05/01/15 2004   05/02/15 0600  imipenem-cilastatin (PRIMAXIN) 250 mg in sodium chloride 0.9 % 100 mL IVPB  Status:  Discontinued     250 mg 200 mL/hr over 30 Minutes Intravenous 3 times per day 05/01/15 2025 05/02/15 0027   05/02/15 0030  meropenem (MERREM) 500 mg in sodium chloride 0.9 % 50 mL IVPB  Status:  Discontinued     500 mg 100 mL/hr over 30 Minutes Intravenous Every 12 hours 05/02/15 0027 05/08/15 1054   05/01/15 2030  imipenem-cilastatin (PRIMAXIN) 250 mg in sodium chloride 0.9 % 100 mL IVPB     250 mg 200 mL/hr over 30 Minutes Intravenous NOW 05/01/15 2025 05/01/15 2110   04/30/15 1100  metroNIDAZOLE (FLAGYL) IVPB 500 mg  Status:  Discontinued     500 mg 100 mL/hr over 60 Minutes Intravenous Every 12 hours 04/30/15 0951 05/02/15 0751   04/30/15 1000  cefTRIAXone (ROCEPHIN) 1 g in dextrose 5 % 50 mL IVPB  Status:  Discontinued     1 g 100 mL/hr over 30 Minutes Intravenous Every 24 hours 04/30/15 0939 05/01/15 1205   04/30/15 0945  cefTRIAXone (ROCEPHIN) injection 1 g  Status:  Discontinued     1 g Intramuscular Every 24 hours 04/30/15 0930 04/30/15 0938   04/29/15 0200  imipenem-cilastatin (PRIMAXIN) 250 mg in sodium chloride 0.9 % 100  mL IVPB  Status:  Discontinued     250 mg 200 mL/hr over 30 Minutes Intravenous Every 8 hours 04/28/15 1812 04/30/15 0930   04/28/15 1830  imipenem-cilastatin (PRIMAXIN) 250 mg in sodium chloride 0.9 % 100 mL IVPB     250 mg 200 mL/hr over 30 Minutes Intravenous  Once 04/28/15 1812 04/28/15 1904       Assessment/Plan Acute necrotizing pancreatitis with SIRS Gallstones -continue antibiotics, bowel rest, TPN.  No surgical role at this time.  CT 1/14 shows stable necrotizing pancreatitis(non contrast).  Will continue to follow.   Erby Pian, [facility] Pager [phone])   05/13/2015 8:01 AM

## 2015-05-13 NOTE — Progress Notes (Addendum)
PARENTERAL NUTRITION CONSULT NOTE - FOLLOW-UP  Pharmacy Consult for TPN Indication: severe necrotizing pancreatitis with ileus  No Known Allergies  Patient Measurements: Height: 5' 2"  (157.5 cm) Weight: 137 lb 12.6 oz (62.5 kg) IBW/kg (Calculated) : 50.1   Vital Signs: Temp: 98.2 F (36.8 C) (01/16 0800) Temp Source: Oral (01/16 0800) BP: 105/61 mmHg (01/16 0800) Pulse Rate: 113 (01/16 0800) Intake/Output from previous day: 01/15 0701 - 01/16 0700 In: 2939.1 [I.V.:1200; IV Piggyback:100; TPN:1639.1] Out: 1850 [Urine:1850]    Labs:  Recent Labs  05/11/15 0500 05/12/15 1044 05/13/15 0530  WBC 20.9*  --  22.0*  HGB 8.1*  --  7.1*  HCT 24.9*  --  21.0*  PLT 558*  --  504*  INR  --  1.18  --      Recent Labs  05/11/15 0305 05/12/15 0422 05/13/15 0530  NA 145 141 139  K 4.1 4.1 3.8  CL 118* 116* 115*  CO2 18* 16* 15*  GLUCOSE 254* 181* 173*  BUN 28* 28* 34*  CREATININE 1.18* 1.33* 1.35*  CALCIUM 10.6* 10.5* 10.1  MG  --   --  1.3*  PHOS  --   --  3.0  PROT  --   --  5.4*  ALBUMIN  --   --  1.4*  AST  --   --  56*  ALT  --   --  39  ALKPHOS  --   --  206*  BILITOT  --   --  0.8  PREALBUMIN  --   --  5.3*  TRIG  --   --  130   Estimated Creatinine Clearance: 35.7 mL/min (by C-G formula based on Cr of 1.35).    Recent Labs  05/12/15 2323 05/13/15 0440 05/13/15 0736  GLUCAP 202* 175* 145*    Medical History: Past Medical History  Diagnosis Date  . Olivopontocerebellar atrophy (HCC)     Mgmt Dr Brett Fairy  . Diabetes mellitus     Type II. Insulin dependent. Diabetic nephropathy.    Insulin Requirements:  Lantus 40 units bid, 85 units of regular insulin added to TPN, 33 units of SSI (resistant scale)  Current Nutrition: NPO  IVF: D5W at 98m/hr  Central access: PICC per IR TPN start date: 1/9  ASSESSMENT                                                                                                          HPI:  [id number] presents with N/V  and abdominal pain on 1/1, found to have gallstone pancreatitis.  CT revealing necrotizing pancreatitis. Surgery note form 1/8 states severe abdominal distention and ileus.  Pharmacy asked to start TPN on 1/7 but PICC has not been able to be placed by IV team.  Plans for IR to place PICC. Of note, she has DM on lantus and metformin prior to admission as well as hyperlipidemia on statin therapy.   Significant events:   05/09/2015 - Stool occurrence x2  1/13 NG removed.  Continues on D5W.  Not a reasonable fluid  alternative at this point with chloride still elevated.  Had discussion with MD and patient needs continued fluid replacement  1/17: MD ok to d/c D5W to help control CBGs  Today, 05/13/2015:   Glucose - improved but still uncontrolled with max amount of insulin allowed in Clinimix and recent increase in Lantus dose  Electrolytes - Na WNL and Cl still high but improving with D5W, CO2 low and decreasing. Corr Ca 12.2 (Ca x Phos < 55).  Mg low; 2g given this AM; Phos wnl  Renal - AKI resolving, UOP remains high  LFTs - Alk Phos elevated, increasing; Albumin dropping  TGs - wnl  Prealbumin - worse at 5.3  NUTRITIONAL GOALS                                                                                           RD recs: Kcal: 1800-2000 Protein: 85-95 grams Fluid: 2-2.2 L/day  Clinimix E 5/15 at a goal rate of 75 ml/hr + 20% fat emulsion at 10 ml/hr to provide: 90 g/day protein, 1758 Kcal/day. (could consider increase to 46m/hr if tolerates)  PLAN                                                                                                                         At 1800 today:  Advance Clinimix 5/15 to 70 ml/hr (remains below goal). This will roughly account for the dextrose removed by stopping D5W at 50 ml/hr. CBGs not fully controlled but greater urgency to advance nutrition with prealbumin low and continuing to decline.  Keep electrolytes removed due to hypercalcemia    Add Novolog 95 units/24 hr   TPN to contain standard multivitamins and trace elements.  Increase 20% fat emulsion to 10 ml/hr  Give an additional 2g Mg x 1  Continue Lantus 40 units q12h (80 units/day).  D/c D5W infusion; per Surgery can have free water if tolerated  Continue resistant SSI q4h.  Recheck BMET, Mg, Phos tomorrow  TPN lab panels on Mondays & Thursdays.  DReuel Boom PharmD, BCPS Pager: [phone]/16/2017, 10:36 AM

## 2015-05-13 NOTE — Progress Notes (Addendum)
Nutrition Follow-up  INTERVENTION:   TPN per Pharmacy -Needs re-estimated below RD to continue to monitor  NUTRITION DIAGNOSIS:   Inadequate oral intake related to inability to eat as evidenced by NPO status.  Ongoing.  GOAL:   Patient will meet greater than or equal to 90% of their needs  Not meeting.  MONITOR:   PO intake, Diet advancement, Labs, Weight trends, Skin, I & O's, Other (Comment) (TPN)  ASSESSMENT:   Victoria Morrow is a 65 y.o. female with a past medical history of type 2 diabetes on insulin, chronic kidney disease of unknown stage, hyperlipidemia, who was in her usual state of health till earlier today when she started developing nausea followed by vomiting. She also started having upper abdominal pain. Symptoms got worse and she decided to come into the hospital for further evaluation. She denies having had similar symptoms in the past. Pain is located in the upper abdomen without any radiation. It's achy pain 10 out of 10 in intensity. No aggravating or relieving factors. No known precipitant factors. Denies any recent antibiotic use. No sick contacts. No recent hospitalization. Denies any fever but has had chills. Denies any blood in the emesis or in the stool.  Needs re-estimated using current weight status, pt seems to have gained some fluid over the past week but is now 137 lb, closer to admission weight.   TPN currently running at 55 ml/hr with lipids @ 8 ml/hr, providing 1321 kcal (83% of needs) and 66 g protein (88% of needs).  Plan per Pharmacy (Addendum: updated @ 1600): At 1800 today:  Advance Clinimix 5/15 to 70 ml/hr (remains below goal). This will roughly account for the dextrose removed by stopping D5W at 50 ml/hr. CBGs not fully controlled but greater urgency to advance nutrition with prealbumin low and continuing to decline.  Keep electrolytes removed due to hypercalcemia   Add Novolog 95 units/24 hr   TPN to contain standard multivitamins  and trace elements.  Increase 20% fat emulsion to 10 ml/hr  Give an additional 2g Mg x 1  Continue Lantus 40 units q12h (80 units/day).  D/c D5W infusion; per Surgery can have free water if tolerated  Labs reviewed: CBGs: 145-202 -> trending down Elevated BUN & Creatinine Low Mg Phos WNL  Diet Order:  TPN (CLINIMIX) Adult without lytes Diet NPO time specified Except for: Ice Chips TPN [facility](CLINIMIX) Adult without lytes  Skin:  Reviewed, no issues  Last BM:  1/15  Height:   Ht Readings from Last 1 Encounters:  05/01/15 5\' 2"  (1.575 m)    Weight:   Wt Readings from Last 1 Encounters:  05/12/15 137 lb 12.6 oz (62.5 kg)    Ideal Body Weight:  50 kg  BMI:  Body mass index is 25.2 kg/(m^2).  Estimated Nutritional Needs:   Kcal:  1600-1800  Protein:  75-85g  Fluid:  1.8 - 2L/day  EDUCATION NEEDS:   No education needs identified at this time  Tilda FrancoLindsey Sadie Pickar, MS, RD, LDN Pager: [phone] After Hours Pager: [phone]

## 2015-05-13 NOTE — Progress Notes (Signed)
Discussed imaging studies with Dr. Rito EhrlichKrishnan  in radiology. 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 Her fevers do not differentiate whether or not this is infection, since sterile pancreatic necrosis can cause fever.  Her creatinine is 1.35 and her GFR is 40. Dr. Rito EhrlichKrishnan on suggest MR abdomen with half dose gadolinium which should be safe in this setting and much less nephrotoxic and IV contrast that we use for CT scan. We will proceed with this MRA study tomorrow morning.  Ernestene MentionINGRAM,Yareliz Thorstenson M

## 2015-05-13 NOTE — Progress Notes (Signed)
  Echocardiogram 2D Echocardiogram has been performed.  Arvil ChacoFoster, Riaz Onorato 05/13/2015, 12:50 PM

## 2015-05-14 LAB — CBC
HCT: 26.6 % — ABNORMAL LOW (ref 36.0–46.0)
HEMOGLOBIN: 9.1 g/dL — AB (ref 12.0–15.0)
MCH: 28.5 pg (ref 26.0–34.0)
MCHC: 34.2 g/dL (ref 30.0–36.0)
MCV: 83.4 fL (ref 78.0–100.0)
PLATELETS: 538 10*3/uL — AB (ref 150–400)
RBC: 3.19 MIL/uL — AB (ref 3.87–5.11)
RDW: 13.1 % (ref 11.5–15.5)
WBC: 21 10*3/uL — AB (ref 4.0–10.5)

## 2015-05-14 LAB — TYPE AND SCREEN
ABO/RH(D): B POS
Antibody Screen: NEGATIVE
UNIT DIVISION: 0

## 2015-05-14 LAB — BASIC METABOLIC PANEL
Anion gap: 11 (ref 5–15)
BUN: 34 mg/dL — AB (ref 6–20)
CALCIUM: 10.6 mg/dL — AB (ref 8.9–10.3)
CO2: 14 mmol/L — ABNORMAL LOW (ref 22–32)
CREATININE: 1.11 mg/dL — AB (ref 0.44–1.00)
Chloride: 118 mmol/L — ABNORMAL HIGH (ref 101–111)
GFR calc Af Amer: 59 mL/min — ABNORMAL LOW (ref >60)
GFR, EST NON AFRICAN AMERICAN: 51 mL/min — AB (ref >60)
GLUCOSE: 105 mg/dL — AB (ref 65–99)
POTASSIUM: 3.6 mmol/L (ref 3.5–5.1)
SODIUM: 143 mmol/L (ref 135–145)

## 2015-05-14 LAB — PHOSPHORUS: Phosphorus: 2.7 mg/dL (ref 2.5–4.6)

## 2015-05-14 LAB — GLUCOSE, CAPILLARY
GLUCOSE-CAPILLARY: 126 mg/dL — AB (ref 65–99)
GLUCOSE-CAPILLARY: 144 mg/dL — AB (ref 65–99)
GLUCOSE-CAPILLARY: 147 mg/dL — AB (ref 65–99)
Glucose-Capillary: 105 mg/dL — ABNORMAL HIGH (ref 65–99)
Glucose-Capillary: 110 mg/dL — ABNORMAL HIGH (ref 65–99)
Glucose-Capillary: 122 mg/dL — ABNORMAL HIGH (ref 65–99)
Glucose-Capillary: 99 mg/dL (ref 65–99)

## 2015-05-14 LAB — MAGNESIUM: MAGNESIUM: 2.3 mg/dL (ref 1.7–2.4)

## 2015-05-14 MED ORDER — FAT EMULSION 20 % IV EMUL
240.0000 mL | INTRAVENOUS | Status: AC
  Administered 2015-05-14: 240 mL via INTRAVENOUS
  Filled 2015-05-14: qty 250

## 2015-05-14 MED ORDER — POTASSIUM CHLORIDE 2 MEQ/ML IV SOLN
INTRAVENOUS | Status: DC
  Administered 2015-05-14: 09:00:00 via INTRAVENOUS
  Filled 2015-05-14: qty 1000

## 2015-05-14 MED ORDER — TRACE MINERALS CR-CU-MN-SE-ZN 10-1000-500-60 MCG/ML IV SOLN
INTRAVENOUS | Status: AC
  Administered 2015-05-14: 17:00:00 via INTRAVENOUS
  Filled 2015-05-14: qty 1320

## 2015-05-14 MED ORDER — METOPROLOL TARTRATE 1 MG/ML IV SOLN
5.0000 mg | Freq: Four times a day (QID) | INTRAVENOUS | Status: DC
  Administered 2015-05-14 – 2015-05-24 (×40): 5 mg via INTRAVENOUS
  Filled 2015-05-14 (×41): qty 5

## 2015-05-14 NOTE — Progress Notes (Signed)
Nutrition Follow-up  INTERVENTION:   TPN per Pharmacy RD to continue to monitor  NUTRITION DIAGNOSIS:   Inadequate oral intake related to inability to eat as evidenced by NPO status.  Ongoing.  GOAL:   Patient will meet greater than or equal to 90% of their needs  Meeting with TPN.  MONITOR:   PO intake, Diet advancement, Labs, Weight trends, Skin, I & O's, Other (Comment) (TPN)  ASSESSMENT:   ETHLYN ALTO is a 65 y.o. female with a past medical history of type 2 diabetes on insulin, chronic kidney disease of unknown stage, hyperlipidemia, who was in her usual state of health till earlier today when she started developing nausea followed by vomiting. She also started having upper abdominal pain. Symptoms got worse and she decided to come into the hospital for further evaluation. She denies having had similar symptoms in the past. Pain is located in the upper abdomen without any radiation. It's achy pain 10 out of 10 in intensity. No aggravating or relieving factors. No known precipitant factors. Denies any recent antibiotic use. No sick contacts. No recent hospitalization. Denies any fever but has had chills. Denies any blood in the emesis or in the stool.  Pt's CBGs now controlled <150 mg/dL. Pt continues to be NPO. RD will continue to monitor for diet advancement and additional needs.  Plan per Pharmacy: At 1800 today:  Cont Clinimix 5/15 at 70 ml/hr which is now the goal rate.   Keep electrolytes removed due to hypercalcemia  Decrease insulin to 85 units/24 hr  TPN to contain standard multivitamins and trace elements.  Cont 20% fat emulsion at 10 ml/hr.  Continue Lantus 40 units q12h (80 units/day).  Start D5W with KCL at 77ml/hr. (to protect against hypoglycemia and to deliver KCl/24hrs)  Labs reviewed: CBGs: 99-110 Elevated BUN & Creatinine Mg/Phos WNL  Diet Order:  Diet NPO time specified Except for: Ice Chips TPN (CLINIMIX) Adult without  lytes TPN (CLINIMIX) Adult without lytes  Skin:  Reviewed, no issues  Last BM:  1/15  Height:   Ht Readings from Last 1 Encounters:  05/01/15  (1.575 m)    Weight:   Wt Readings from Last 1 Encounters:  05/12/15 137 lb 12.6 oz (62.5 kg)    Ideal Body Weight:  50 kg  BMI:  Body mass index is 25.2 kg/(m^2).  Estimated Nutritional Needs:   Kcal:  1600-1800  Protein:  75-85g  Fluid:  1.8 - 2L/day  EDUCATION NEEDS:   No education needs identified at this time  Tilda Franco, MS, RD, LDN Pager: [phone] After Hours Pager: [phone]

## 2015-05-14 NOTE — Progress Notes (Signed)
Patient ID: Victoria Morrow, female   DOB: [birth year], 65 y.o.   MRN: [id number]     [address] [place], [place] [address]    Phone: [phone] FAX: [phone]     Subjective: No n/v.  No flatus or BM.  Complains of left flank pain. Denies abdominal pain. Remains tachypinic and tachycardic.  t max 101.6.  WBC down  22 to 21k.  Objective:  Vital signs:  Filed Vitals:   05/14/15 0500 05/14/15 0600 05/14/15 0700 05/14/15 0727  BP:    115/66  Pulse:   101 114  Temp:    99 F (37.2 C)  TempSrc:    Oral  Resp: 32 29 30 33  Height:      Weight:      SpO2:   100% 100%    Last BM Date: 05/12/15  Intake/Output   Yesterday:  01/16 0701 - 01/17 0700 In: 3073.4 [I.V.:525; Blood:335; IV Piggyback:350; TPN:1733.4] Out: 4628 [Urine:3700] This shift:     Physical Exam: General: Pt awake/alert/oriented x4 in no acute distress  Abdomen: Soft.  distended.  Non tender.  No evidence of peritonitis.  No incarcerated hernias.   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HCC)   Abscess   Fever   Necrotizing pancreatitis    Results:   Labs: Results for orders placed or performed during the hospital encounter of 04/28/15 (from the past 48 hour(s))  Protime-INR     Status: None   Collection Time: 05/12/15 10:44 AM  Result Value Ref Range   Prothrombin Time 15.2 11.6 - 15.2 seconds   INR 1.18 0.00 - 1.49  Culture, body fluid-bottle     Status:  None (Preliminary result)   Collection Time: 05/12/15 12:43 PM  Result Value Ref Range   Specimen Description FLUID PERITONEAL    Special Requests NONE    Culture      NO GROWTH < 24 HOURS Performed at [facility]    Report Status PENDING   Gram stain     Status: None   Collection Time: 05/12/15 12:43 PM  Result Value Ref Range   Specimen Description FLUID PERITONEAL    Special Requests NONE    Gram Stain      FEW WBC PRESENT, PREDOMINANTLY MONONUCLEAR NO ORGANISMS SEEN Performed at [facility]    Report Status 05/12/2015 FINAL   Albumin, pleural or peritoneal fluid     Status: None   Collection Time: 05/12/15 12:57 PM  Result Value Ref Range   Albumin, Fluid <1.0 g/dL    Comment: (NOTE) No normal range established for this test Results should be evaluated in conjunction with serum values Performed at [place]: CORRECTED ON 01/15 AT 1312: PREVIOUSLY REPORTED AS Peritoneal  Lactate dehydrogenase (CSF, pleural or peritoneal fluid)     Status: Abnormal   Collection Time: 05/12/15 12:57 PM  Result  Value Ref Range   LD, Fluid 5826 (H) 3 - 23 U/L    Comment: RESULTS CONFIRMED BY MANUAL DILUTION (NOTE) Results should be evaluated in conjunction with serum values Performed at [facility]    Fluid Type-FLDH PERITONEAL     Comment: CORRECTED ON 01/15 AT 1313: PREVIOUSLY REPORTED AS Peritoneal  Protein, pleural or peritoneal fluid     Status: None   Collection Time: 05/12/15 12:57 PM  Result Value Ref Range   Total protein, fluid <3.0 g/dL    Comment: (NOTE) No normal range established for this test Results should be evaluated in conjunction with serum values Performed at [facility]    Fluid Type-FTP PERITONEAL     Comment: CORRECTED ON 01/15 AT 1313: PREVIOUSLY REPORTED AS Peritoneal  Triglycerides, Body Fluid     Status: None   Collection Time: 05/12/15 12:57 PM  Result Value Ref Range    Triglycerides, Fluid 75 mg/dL    Comment: (NOTE) ________________________________________________________ :  Peritoneal  :       Pleural          :   Synovial     : :______________:________________________:________________: :              : Transudate :  Exudate  :                : :______________:____________:___________:________________: :  Not Estab.  : Not Estab. : Not Estab.:   Not Estab.   : :______________:____________:___________:________________: The method performance specifications have not been established for this test in body fluid. The test result should be integrated into the clinical context for interpretation. The reference intervals and other method performance specifications have not been established for this test. The test result should be integrated into the clinical context for interpretation. Performed At: [facility] [place], [place] [id number] Lindon Romp MD FU:[id number]    Fluid Type-FTRIG PERITONEAL     Comment: CORRECTED ON 01/15 AT 1313: PREVIOUSLY REPORTED AS Peritoneal  Body fluid cell count with differential     Status: Abnormal   Collection Time: 05/12/15 12:57 PM  Result Value Ref Range   Fluid Type-FCT PERITONEAL     Comment: CORRECTED ON 01/15 AT 1313: PREVIOUSLY REPORTED AS Peritoneal   Color, Fluid BROWN (A) YELLOW   Appearance, Fluid TURBID (A) CLEAR   WBC, Fluid 6 0 - 1000 cu mm   Other Cells, Fluid TOO FEW TO COUNT, SMEAR AVAILABLE FOR REVIEW %    Comment: FEW NEUTROPHILS SEEN  Glucose, pleural or peritoneal fluid     Status: None   Collection Time: 05/12/15 12:57 PM  Result Value Ref Range   Glucose, Fluid 179 mg/dL    Comment: (NOTE) No normal range established for this test Results should be evaluated in conjunction with serum values Performed at [place]: CORRECTED ON 01/15 AT 1314: PREVIOUSLY REPORTED AS Peritoneal  Amylase, Peritoneal Fluid      Status: None   Collection Time: 05/12/15 12:57 PM  Result Value Ref Range   Amylase, peritoneal fluid >10000 U/L    Comment: (NOTE) Result confirmed by automatic dilution. Reference range: Values greater than or equal to three times a simultaneously analyzed serum value are considered abnormal in ascites of pancreatic origin. Other peritoneal and intestional disorders may also cause elevation of amylase. Performed at [facility]   [place], Body Fluid     Status:  None   Collection Time: 05/12/15 12:57 PM  Result Value Ref Range   pH, Body Fluid 7.6 Not Estab.    Comment: (NOTE) This test was developed and its performance characteristics determined by [facility]. It has not been cleared or approved by the Food and Drug Administration. Performed At: [facility] [place], [place] [id number] Lindon Romp MD EP:[id number]    Source of Sample PERITONEAL   Glucose, capillary     Status: Abnormal   Collection Time: 05/12/15  1:14 PM  Result Value Ref Range   Glucose-Capillary 195 (H) 65 - 99 mg/dL  Glucose, capillary     Status: Abnormal   Collection Time: 05/12/15  5:20 PM  Result Value Ref Range   Glucose-Capillary 204 (H) 65 - 99 mg/dL  Glucose, capillary     Status: Abnormal   Collection Time: 05/12/15  8:08 PM  Result Value Ref Range   Glucose-Capillary 234 (H) 65 - 99 mg/dL   Comment 1 Notify RN    Comment 2 Document in Chart   Glucose, capillary     Status: Abnormal   Collection Time: 05/12/15 11:23 PM  Result Value Ref Range   Glucose-Capillary 202 (H) 65 - 99 mg/dL  Glucose, capillary     Status: Abnormal   Collection Time: 05/13/15  4:40 AM  Result Value Ref Range   Glucose-Capillary 175 (H) 65 - 99 mg/dL   Comment 1 Notify RN    Comment 2 Document in Chart   Comprehensive metabolic panel     Status: Abnormal   Collection Time: 05/13/15  5:30 AM  Result Value Ref Range   Sodium 139 135 - 145 mmol/L   Potassium 3.8 3.5 - 5.1 mmol/L    Chloride 115 (H) 101 - 111 mmol/L   CO2 15 (L) 22 - 32 mmol/L   Glucose, Bld 173 (H) 65 - 99 mg/dL   BUN 34 (H) 6 - 20 mg/dL   Creatinine, Ser 1.35 (H) 0.44 - 1.00 mg/dL   Calcium 10.1 8.9 - 10.3 mg/dL   Total Protein 5.4 (L) 6.5 - 8.1 g/dL   Albumin 1.4 (L) 3.5 - 5.0 g/dL   AST 56 (H) 15 - 41 U/L   ALT 39 14 - 54 U/L   Alkaline Phosphatase 206 (H) 38 - 126 U/L   Total Bilirubin 0.8 0.3 - 1.2 mg/dL   GFR calc non Af Amer 40 (L) >60 mL/min   GFR calc Af Amer 46 (L) >60 mL/min    Comment: (NOTE) The eGFR has been calculated using the CKD EPI equation. This calculation has not been validated in all clinical situations. eGFR's persistently <60 mL/min signify possible Chronic Kidney Disease.    Anion gap 9 5 - 15  Magnesium     Status: Abnormal   Collection Time: 05/13/15  5:30 AM  Result Value Ref Range   Magnesium 1.3 (L) 1.7 - 2.4 mg/dL  Phosphorus     Status: None   Collection Time: 05/13/15  5:30 AM  Result Value Ref Range   Phosphorus 3.0 2.5 - 4.6 mg/dL  CBC     Status: Abnormal   Collection Time: 05/13/15  5:30 AM  Result Value Ref Range   WBC 22.0 (H) 4.0 - 10.5 K/uL   RBC 2.55 (L) 3.87 - 5.11 MIL/uL   Hemoglobin 7.1 (L) 12.0 - 15.0 g/dL   HCT 21.0 (L) 36.0 - 46.0 %   MCV 82.4 78.0 - 100.0 fL   MCH 27.8  26.0 - 34.0 pg   MCHC 33.8 30.0 - 36.0 g/dL   RDW 12.9 11.5 - 15.5 %   Platelets 504 (H) 150 - 400 K/uL  Differential     Status: Abnormal   Collection Time: 05/13/15  5:30 AM  Result Value Ref Range   Neutrophils Relative % 82 %   Lymphocytes Relative 11 %   Monocytes Relative 7 %   Eosinophils Relative 0 %   Basophils Relative 0 %   Neutro Abs 18.1 (H) 1.7 - 7.7 K/uL   Lymphs Abs 2.4 0.7 - 4.0 K/uL   Monocytes Absolute 1.5 (H) 0.1 - 1.0 K/uL   Eosinophils Absolute 0.0 0.0 - 0.7 K/uL   Basophils Absolute 0.0 0.0 - 0.1 K/uL   RBC Morphology POLYCHROMASIA PRESENT   Triglycerides     Status: None   Collection Time: 05/13/15  5:30 AM  Result Value Ref Range    Triglycerides 130 <150 mg/dL    Comment: Performed at [facility]  Prealbumin     Status: Abnormal   Collection Time: 05/13/15  5:30 AM  Result Value Ref Range   Prealbumin 5.3 (L) 18 - 38 mg/dL    Comment: Performed at [facility]  Glucose, capillary     Status: Abnormal   Collection Time: 05/13/15  7:36 AM  Result Value Ref Range   Glucose-Capillary 145 (H) 65 - 99 mg/dL  Prepare RBC     Status: None   Collection Time: 05/13/15 11:00 AM  Result Value Ref Range   Order Confirmation ORDER PROCESSED BY BLOOD BANK   Glucose, capillary     Status: Abnormal   Collection Time: 05/13/15 12:13 PM  Result Value Ref Range   Glucose-Capillary 134 (H) 65 - 99 mg/dL  ABO/Rh     Status: None   Collection Time: 05/13/15  2:10 PM  Result Value Ref Range   ABO/RH(D) B POS   Type and screen [place]     Status: None (Preliminary result)   Collection Time: 05/13/15  2:46 PM  Result Value Ref Range   ABO/RH(D) B POS    Antibody Screen NEG    Sample Expiration 05/16/2015    Unit Number [id number]    Blood Component Type RED CELLS,LR    Unit division 00    Status of Unit ISSUED    Transfusion Status OK TO TRANSFUSE    Crossmatch Result Compatible   Glucose, capillary     Status: Abnormal   Collection Time: 05/13/15  4:03 PM  Result Value Ref Range   Glucose-Capillary 151 (H) 65 - 99 mg/dL  Glucose, capillary     Status: Abnormal   Collection Time: 05/13/15  7:31 PM  Result Value Ref Range   Glucose-Capillary 121 (H) 65 - 99 mg/dL  Glucose, capillary     Status: Abnormal   Collection Time: 05/13/15 11:40 PM  Result Value Ref Range   Glucose-Capillary 110 (H) 65 - 99 mg/dL  Glucose, capillary     Status: Abnormal   Collection Time: 05/14/15  3:53 AM  Result Value Ref Range   Glucose-Capillary 105 (H) 65 - 99 mg/dL  CBC     Status: Abnormal   Collection Time: 05/14/15  5:55 AM  Result Value Ref Range   WBC 21.0 (H) 4.0 - 10.5 K/uL   RBC 3.19  (L) 3.87 - 5.11 MIL/uL   Hemoglobin 9.1 (L) 12.0 - 15.0 g/dL    Comment: REPEATED TO VERIFY DELTA CHECK NOTED  POST TRANSFUSION SPECIMEN    HCT 26.6 (L) 36.0 - 46.0 %   MCV 83.4 78.0 - 100.0 fL   MCH 28.5 26.0 - 34.0 pg   MCHC 34.2 30.0 - 36.0 g/dL   RDW 13.1 11.5 - 15.5 %   Platelets 538 (H) 150 - 400 K/uL  Basic metabolic panel     Status: Abnormal   Collection Time: 05/14/15  5:55 AM  Result Value Ref Range   Sodium 143 135 - 145 mmol/L   Potassium 3.6 3.5 - 5.1 mmol/L   Chloride 118 (H) 101 - 111 mmol/L   CO2 14 (L) 22 - 32 mmol/L   Glucose, Bld 105 (H) 65 - 99 mg/dL   BUN 34 (H) 6 - 20 mg/dL   Creatinine, Ser 1.11 (H) 0.44 - 1.00 mg/dL   Calcium 10.6 (H) 8.9 - 10.3 mg/dL   GFR calc non Af Amer 51 (L) >60 mL/min   GFR calc Af Amer 59 (L) >60 mL/min    Comment: (NOTE) The eGFR has been calculated using the CKD EPI equation. This calculation has not been validated in all clinical situations. eGFR's persistently <60 mL/min signify possible Chronic Kidney Disease.    Anion gap 11 5 - 15  Magnesium     Status: None   Collection Time: 05/14/15  5:55 AM  Result Value Ref Range   Magnesium 2.3 1.7 - 2.4 mg/dL  Phosphorus     Status: None   Collection Time: 05/14/15  5:55 AM  Result Value Ref Range   Phosphorus 2.7 2.5 - 4.6 mg/dL    Imaging / Studies: US Paracentesis  05/12/2015  CLINICAL DATA:  Peritoneal fluid.  Pancreatitis. EXAM: ULTRASOUND GUIDED PARACENTESIS TECHNIQUE: Survey ultrasound of the abdomen was performed and an appropriate skin entry site in the right lower abdomen was selected. Skin site was marked, prepped with Betadine, and draped in usual sterile fashion, and infiltrated locally with 1% lidocaine. A 5 French multisidehole Yueh sheath needle was advanced into the peritoneal space until fluid could be aspirated. The sheath was advanced and the needle removed. 112 cc of cloudy dark ascites were aspirated. COMPLICATIONS: Nine IMPRESSION: Technically successful  ultrasound guided paracentesis, removing 112 cc of ascites. Electronically Signed   By: Marybelle Killings M.D.   On: 05/12/2015 14:04    Medications / Allergies:  Scheduled Meds: . antiseptic oral rinse  7 mL Mouth Rinse q12n4p  . aspirin  81 mg Oral Daily  . chlorhexidine  15 mL Mouth Rinse BID  . famotidine (PEPCID) IV  20 mg Intravenous Q24H  . fluconazole (DIFLUCAN) IV  100 mg Intravenous Q24H  . heparin  5,000 Units Subcutaneous 3 times per day  . insulin aspart  0-20 Units Subcutaneous 6 times per day  . insulin glargine  40 Units Subcutaneous BID  . meropenem (MERREM) IV  1 g Intravenous Q12H  . metoprolol  5 mg Intravenous 3 times per day  . sodium chloride  10-40 mL Intracatheter Q12H  . sodium chloride  3 mL Intravenous Q12H   Continuous Infusions: . fat emulsion 240 mL (05/13/15 1705)  . TPN (CLINIMIX) Adult without lytes 70 mL/hr at 05/13/15 1705   PRN Meds:.acetaminophen **OR** acetaminophen, bismuth subsalicylate, diphenhydrAMINE, fentaNYL (SUBLIMAZE) injection, hydrALAZINE, levalbuterol, metoprolol, ondansetron (ZOFRAN) IV **OR** ondansetron (ZOFRAN) IV, promethazine, sodium chloride  Antibiotics: Anti-infectives    Start     Dose/Rate Route Frequency Ordered Stop   05/13/15 1000  fluconazole (DIFLUCAN) IVPB 100 mg  100 mg 50 mL/hr over 60 Minutes Intravenous Every 24 hours 05/12/15 0813 05/17/15 0959   05/12/15 1000  fluconazole (DIFLUCAN) IVPB 200 mg     200 mg 100 mL/hr over 60 Minutes Intravenous  Once 05/12/15 0813 05/12/15 1029   05/08/15 2200  meropenem (MERREM) 1 g in sodium chloride 0.9 % 100 mL IVPB     1 g 200 mL/hr over 30 Minutes Intravenous Every 12 hours 05/08/15 1054     05/03/15 1200  vancomycin (VANCOCIN) 50 mg/mL oral solution 500 mg  Status:  Discontinued     500 mg Oral 4 times per day 05/03/15 0917 05/04/15 1306   05/02/15 1200  cefTRIAXone (ROCEPHIN) 1 g in dextrose 5 % 50 mL IVPB  Status:  Discontinued     1 g 100 mL/hr over 30 Minutes  Intravenous Every 24 hours 05/01/15 1448 05/01/15 2004   05/02/15 0600  imipenem-cilastatin (PRIMAXIN) 250 mg in sodium chloride 0.9 % 100 mL IVPB  Status:  Discontinued     250 mg 200 mL/hr over 30 Minutes Intravenous 3 times per day 05/01/15 2025 05/02/15 0027   05/02/15 0030  meropenem (MERREM) 500 mg in sodium chloride 0.9 % 50 mL IVPB  Status:  Discontinued     500 mg 100 mL/hr over 30 Minutes Intravenous Every 12 hours 05/02/15 0027 05/08/15 1054   05/01/15 2030  imipenem-cilastatin (PRIMAXIN) 250 mg in sodium chloride 0.9 % 100 mL IVPB     250 mg 200 mL/hr over 30 Minutes Intravenous NOW 05/01/15 2025 05/01/15 2110   04/30/15 1100  metroNIDAZOLE (FLAGYL) IVPB 500 mg  Status:  Discontinued     500 mg 100 mL/hr over 60 Minutes Intravenous Every 12 hours 04/30/15 0951 05/02/15 0751   04/30/15 1000  cefTRIAXone (ROCEPHIN) 1 g in dextrose 5 % 50 mL IVPB  Status:  Discontinued     1 g 100 mL/hr over 30 Minutes Intravenous Every 24 hours 04/30/15 0939 05/01/15 1205   04/30/15 0945  cefTRIAXone (ROCEPHIN) injection 1 g  Status:  Discontinued     1 g Intramuscular Every 24 hours 04/30/15 0930 04/30/15 0938   04/29/15 0200  imipenem-cilastatin (PRIMAXIN) 250 mg in sodium chloride 0.9 % 100 mL IVPB  Status:  Discontinued     250 mg 200 mL/hr over 30 Minutes Intravenous Every 8 hours 04/28/15 1812 04/30/15 0930   04/28/15 1830  imipenem-cilastatin (PRIMAXIN) 250 mg in sodium chloride 0.9 % 100 mL IVPB     250 mg 200 mL/hr over 30 Minutes Intravenous  Once 04/28/15 1812 04/28/15 1904       Assessment/Plan Acute necrotizing pancreatitis with SIRS Gallstones -await MRI results -continue atbx, bowel rest, TPN.   Erby Pian, [facility] Surgery Pager [phone]([phone])   05/14/2015 7:52 AM

## 2015-05-14 NOTE — Progress Notes (Signed)
[facility] PROGRESS NOTE  Victoria Morrow NWG:[id number] DOB: [birth year] DOA: 04/28/2015 PCP: Maryelizabeth Rowan, MD  HPI/Brief narrative 65 year old African-American female with a past medical history of type 2 diabetes on insulin, chronic kidney disease of unknown stage, presented with abdominal pain, nausea and vomiting. She was found to have severe pancreatitis thought to be biliary in origin. She was hospitalized for further management. Critical care medicine was consulted. 1/4 vomited, aspirated, intubated, shock  Required neo since 1/7, transferred care under critical care 1/5 extubated 1/7 SVT requiring cardizem gtt 1/8 Vomiting - NG to LIS 1/12 off cardizem gtt 1/14 care transferred to [facility], with critical care and general surgery following   Assessment/Plan:  necrotizing pancreatitis CT scan with evidence of gallstones. Patient denies any history of alcohol intake. Ultrasound of the abdomen demonstrates GB thickening with pericholecystic fluid and 5mm stone lodged in neck of gallbladder and no Murphy's sign. CBD is normal size. General surgery is following. No clear evidence for acute cholecystitis. T  continue on abx, tpn, diflucan added on 1/14, bowel rest. general surgery following, patient not able to tolerate MRI of abdomen due to not able to hold breath for longer duration. Surgery to decide diet orders. Currently npo except ice chips.   Fever:  has been fever free since 1/10 but spiked fever again on 1/14 tmax 101.3, last fever 3am on 1/17 repeat culture pending, ua +yeast,  started diflucan,   ct chest/ab/pel on 1/14:  pancreatic necrosis with poor definition of the head and body the pancreas with increased low-attenuation edema compared to prior exam. 2. Increased fluid along the pericolic gutters. The are no clear organized pseudocyst at this point. The RIGHT pericolic gutter collection is the most organized. I have discuss with general surgery Dr Daphine Deutscher on 1/15, he  recommended IR consult for fluids collection drain and culture. US guided para was done on 1/15, culture pending.  Anemia: likely anemia of chronic disease, patient remain sinus tachycardia, tachypenic, transfuse 1prbc on 1/15  Hypomagnesemia: replace mag.  Septic shock: required intubation and pressors. Now off pressors, extubated. Remain sinus tachcyaardia, tachypneic, febrile, leukocytosis.  VDRF; intubated on 1/14 and extubated on 1/15   PAF/SVT:  required cardizem drip, now on lopressor.  Has been in sinus tachycardia since transfer to [facility] Echo lvef 55%-60%, dynamic obstruction reported, grade 1 diastolic dysfunction reported.  increase betablocker iv lopressor from q8hrs to q6hr on 1/17, continue monitor bp and heart rate.  UTI: kleb pneu finished abx treatment  Urinary retention: urology consulted initially during hospitalization due to difficult foley placed placement. She is found to have grade 2 cystocele and urethral stenosis.  ARF on CKD III; better, cr now back to baseline, cr 2.8 on admission, nadir 1.18 on 1/14, today 1,35.   Transaminitis LFTs are slightly improved. Probably related to her necrotizing pancreatitis/sepsis/tpn. Ultrasound of the right upper quadrant as above. Hepatitis panel is negative. Continue to trend LFTs.  Insulin dependent Diabetes mellitus type 2, poorly controlled, with chronic kidney disease . HbA1c is 7.7.  Continue insulin, adjust prn D/c D5. Continue adjust insulin, TPN  Code Status: Full Family Communication: Patient and son in room Disposition Plan:  remain in stepdown   Consultants:  TRH-1/4PCCM -1/14TRH  General Surgery  Urology  Procedures:  Intubation /extubation/ng  Central line  tpn  US guided para on 1/15   Antibiotics: Anti-infectives    Start     Dose/Rate Route Frequency Ordered Stop   05/13/15 1000  fluconazole (DIFLUCAN) IVPB 100 mg  100 mg 50 mL/hr over 60 Minutes Intravenous Every 24 hours  05/12/15 0813 05/17/15 0959   05/12/15 1000  fluconazole (DIFLUCAN) IVPB 200 mg     200 mg 100 mL/hr over 60 Minutes Intravenous  Once 05/12/15 0813 05/12/15 1029   05/08/15 2200  meropenem (MERREM) 1 g in sodium chloride 0.9 % 100 mL IVPB     1 g 200 mL/hr over 30 Minutes Intravenous Every 12 hours 05/08/15 1054     05/03/15 1200  vancomycin (VANCOCIN) 50 mg/mL oral solution 500 mg  Status:  Discontinued     500 mg Oral 4 times per day 05/03/15 0917 05/04/15 1306   05/02/15 1200  cefTRIAXone (ROCEPHIN) 1 g in dextrose 5 % 50 mL IVPB  Status:  Discontinued     1 g 100 mL/hr over 30 Minutes Intravenous Every 24 hours 05/01/15 1448 05/01/15 2004   05/02/15 0600  imipenem-cilastatin (PRIMAXIN) 250 mg in sodium chloride 0.9 % 100 mL IVPB  Status:  Discontinued     250 mg 200 mL/hr over 30 Minutes Intravenous 3 times per day 05/01/15 2025 05/02/15 0027   05/02/15 0030  meropenem (MERREM) 500 mg in sodium chloride 0.9 % 50 mL IVPB  Status:  Discontinued     500 mg 100 mL/hr over 30 Minutes Intravenous Every 12 hours 05/02/15 0027 05/08/15 1054   05/01/15 2030  imipenem-cilastatin (PRIMAXIN) 250 mg in sodium chloride 0.9 % 100 mL IVPB     250 mg 200 mL/hr over 30 Minutes Intravenous NOW 05/01/15 2025 05/01/15 2110   04/30/15 1100  metroNIDAZOLE (FLAGYL) IVPB 500 mg  Status:  Discontinued     500 mg 100 mL/hr over 60 Minutes Intravenous Every 12 hours 04/30/15 0951 05/02/15 0751   04/30/15 1000  cefTRIAXone (ROCEPHIN) 1 g in dextrose 5 % 50 mL IVPB  Status:  Discontinued     1 g 100 mL/hr over 30 Minutes Intravenous Every 24 hours 04/30/15 0939 05/01/15 1205   04/30/15 0945  cefTRIAXone (ROCEPHIN) injection 1 g  Status:  Discontinued     1 g Intramuscular Every 24 hours 04/30/15 0930 04/30/15 0938   04/29/15 0200  imipenem-cilastatin (PRIMAXIN) 250 mg in sodium chloride 0.9 % 100 mL IVPB  Status:  Discontinued     250 mg 200 mL/hr over 30 Minutes Intravenous Every 8 hours 04/28/15 1812  04/30/15 0930   04/28/15 1830  imipenem-cilastatin (PRIMAXIN) 250 mg in sodium chloride 0.9 % 100 mL IVPB     250 mg 200 mL/hr over 30 Minutes Intravenous  Once 04/28/15 1812 04/28/15 1904      HPI/Subjective: last fever 3am on1/117, remain tachypneic, less sinus tachycardia, blood pressure better, last bm on 1/14, denies n/v. Denies ab pain,  Remain very frail, but looks slightly stronger today on 1/17.  Objective: Filed Vitals:   05/14/15 0500 05/14/15 0600 05/14/15 0700 05/14/15 0727  BP:    115/66  Pulse:   101 114  Temp:    99 F (37.2 C)  TempSrc:    Oral  Resp: 32 29 30 33  Height:      Weight:      SpO2:   100% 100%    Intake/Output Summary (Last 24 hours) at 05/14/15 0803 Last data filed at 05/14/15 0700  Gross per 24 hour  Intake 3018.42 ml  Output   3700 ml  Net -681.58 ml   Filed Weights   05/02/15 0400 05/09/15 0553 05/12/15 0923  Weight: 70.5 kg (155 lb 6.8  oz) 63.2 kg (139 lb 5.3 oz) 62.5 kg (137 lb 12.6 oz)    Exam:   General:  Very frail  Cardiovascular: less sinus tachycardic, s1, s2  Respiratory: decreased at basis, no wheezing, no rales, no rhonchi  Abdomen: obese, decreased BS, non tender  Musculoskeletal: perfused, no clubbing   Data Reviewed: Basic Metabolic Panel:  Recent Labs Lab 05/08/15 0440 05/09/15 0400 05/10/15 0412 05/11/15 0305 05/12/15 0422 05/13/15 0530 05/14/15 0555  NA 152* 151* 149* 145 141 139 143  K 4.2 4.5 4.5 4.1 4.1 3.8 3.6  CL 124* 124* 123* 118* 116* 115* 118*  CO2 19* 18* 18* 18* 16* 15* 14*  GLUCOSE 278* 282* 235* 254* 181* 173* 105*  BUN 29* 28* 29* 28* 28* 34* 34*  CREATININE 1.42* 1.40* 1.28* 1.18* 1.33* 1.35* 1.11*  CALCIUM 9.2 10.3 10.3 10.6* 10.5* 10.1 10.6*  MG 1.9 1.8 1.7  --   --  1.3* 2.3  PHOS 3.1 3.1 3.3  --   --  3.0 2.7   Liver Function Tests:  Recent Labs Lab 05/09/15 0400 05/13/15 0530  AST 48* 56*  ALT 26 39  ALKPHOS 141* 206*  BILITOT 0.8 0.8  PROT 5.7* 5.4*  ALBUMIN  1.7* 1.4*    Recent Labs Lab 05/12/15 0422  LIPASE 44   No results for input(s): AMMONIA in the last 168 hours. CBC:  Recent Labs Lab 05/08/15 0440 05/10/15 0412 05/11/15 0500 05/13/15 0530 05/14/15 0555  WBC 25.0* 20.1* 20.9* 22.0* 21.0*  NEUTROABS  --   --   --  18.1*  --   HGB 7.9* 7.8* 8.1* 7.1* 9.1*  HCT 24.2* 24.1* 24.9* 21.0* 26.6*  MCV 85.2 87.0 85.6 82.4 83.4  PLT 545* 556* 558* 504* 538*   Cardiac Enzymes: No results for input(s): CKTOTAL, CKMB, CKMBINDEX, TROPONINI in the last 168 hours. BNP (last 3 results) No results for input(s): BNP in the last 8760 hours.  ProBNP (last 3 results) No results for input(s): PROBNP in the last 8760 hours.  CBG:  Recent Labs Lab 05/13/15 1213 05/13/15 1603 05/13/15 1931 05/13/15 2340 05/14/15 0353  GLUCAP 134* 151* 121* 110* 105*    Recent Results (from the past 240 hour(s))  Culture, blood (routine x 2)     Status: None (Preliminary result)   Collection Time: 05/11/15 10:43 AM  Result Value Ref Range Status   Specimen Description BLOOD BLOOD LEFT HAND  Final   Special Requests BOTTLES DRAWN AEROBIC ONLY 6CC  Final   Culture   Final    NO GROWTH 2 DAYS Performed at [facility]    Report Status PENDING  Incomplete  Culture, blood (routine x 2)     Status: None (Preliminary result)   Collection Time: 05/11/15 10:50 AM  Result Value Ref Range Status   Specimen Description BLOOD BLOOD LEFT HAND  Final   Special Requests IN PEDIATRIC BOTTLE 3CC  Final   Culture   Final    NO GROWTH 2 DAYS Performed at [facility]    Report Status PENDING  Incomplete  Culture, body fluid-bottle     Status: None (Preliminary result)   Collection Time: 05/12/15 12:43 PM  Result Value Ref Range Status   Specimen Description FLUID PERITONEAL  Final   Special Requests NONE  Final   Culture   Final    NO GROWTH < 24 HOURS Performed at [facility]    Report Status PENDING  Incomplete  Gram stain  Status: None   Collection Time: 05/12/15 12:43 PM  Result Value Ref Range Status   Specimen Description FLUID PERITONEAL  Final   Special Requests NONE  Final   Gram Stain   Final    FEW WBC PRESENT, PREDOMINANTLY MONONUCLEAR NO ORGANISMS SEEN Performed at [facility]    Report Status 05/12/2015 FINAL  Final     Studies: US Paracentesis  05/12/2015  CLINICAL DATA:  Peritoneal fluid.  Pancreatitis. EXAM: ULTRASOUND GUIDED PARACENTESIS TECHNIQUE: Survey ultrasound of the abdomen was performed and an appropriate skin entry site in the right lower abdomen was selected. Skin site was marked, prepped with Betadine, and draped in usual sterile fashion, and infiltrated locally with 1% lidocaine. A 5 French multisidehole Yueh sheath needle was advanced into the peritoneal space until fluid could be aspirated. The sheath was advanced and the needle removed. 112 cc of cloudy dark ascites were aspirated. COMPLICATIONS: Nine IMPRESSION: Technically successful ultrasound guided paracentesis, removing 112 cc of ascites. Electronically Signed   By: Jolaine Click M.D.   On: 05/12/2015 14:04    Scheduled Meds: . antiseptic oral rinse  7 mL Mouth Rinse q12n4p  . aspirin  81 mg Oral Daily  . chlorhexidine  15 mL Mouth Rinse BID  . famotidine (PEPCID) IV  20 mg Intravenous Q24H  . fluconazole (DIFLUCAN) IV  100 mg Intravenous Q24H  . heparin  5,000 Units Subcutaneous 3 times per day  . insulin aspart  0-20 Units Subcutaneous 6 times per day  . insulin glargine  40 Units Subcutaneous BID  . meropenem (MERREM) IV  1 g Intravenous Q12H  . metoprolol  5 mg Intravenous 3 times per day  . sodium chloride  10-40 mL Intracatheter Q12H  . sodium chloride  3 mL Intravenous Q12H   Continuous Infusions: . fat emulsion 240 mL (05/13/15 1705)  . TPN (CLINIMIX) Adult without lytes 70 mL/hr at 05/13/15 1705    Principal Problem:   Acute necrotizing pancreatitis with SIRS Active Problems:   Gallstones    Transaminitis   DM type 2 (diabetes mellitus, type 2) (HCC)   Hyperlipidemia   ARF (acute renal failure) (HCC)   Acute respiratory failure with hypoxia (HCC)   Aspiration pneumonia due to vomit (HCC)   Acute on chronic respiratory failure (HCC)   Protein-calorie malnutrition, moderate (HCC)   Umbilical hernia   Chronic kidney disease (CKD), stage III (moderate)   Olivopontocerebellar atrophy (HCC)   Abscess   Fever   Necrotizing pancreatitis    Lilburn Straw MD PhD  [facility] Pager [phone] If 7PM-7AM, please contact night-coverage at [email or web address], password [facility] 05/14/2015, 8:03 AM  LOS: 16 days

## 2015-05-14 NOTE — Progress Notes (Signed)
Cancel MRI, pt will not be able to hold tolerate. Plan to repeat CT scan later this week.  Remigio Mcmillon, ANP-BC

## 2015-05-14 NOTE — Progress Notes (Signed)
PARENTERAL NUTRITION CONSULT NOTE - FOLLOW-UP  Pharmacy Consult for TPN Indication: severe necrotizing pancreatitis with ileus  No Known Allergies  Patient Measurements: Height: 5' 2"  (157.5 cm) Weight: 137 lb 12.6 oz (62.5 kg) IBW/kg (Calculated) : 50.1   Vital Signs: Temp: 99 F (37.2 C) (01/17 0727) Temp Source: Oral (01/17 0727) BP: 115/66 mmHg (01/17 0727) Pulse Rate: 114 (01/17 0727) Intake/Output from previous day: 01/16 0701 - 01/17 0700 In: 3073.4 [I.V.:525; Blood:335; IV Piggyback:350; TPN:1733.4] Out: 3700 [Urine:3700]    Labs:  Recent Labs  05/12/15 1044 05/13/15 0530 05/14/15 0555  WBC  --  22.0* 21.0*  HGB  --  7.1* 9.1*  HCT  --  21.0* 26.6*  PLT  --  504* 538*  INR 1.18  --   --      Recent Labs  05/12/15 0422 05/13/15 0530 05/14/15 0555  NA 141 139 143  K 4.1 3.8 3.6  CL 116* 115* 118*  CO2 16* 15* 14*  GLUCOSE 181* 173* 105*  BUN 28* 34* 34*  CREATININE 1.33* 1.35* 1.11*  CALCIUM 10.5* 10.1 10.6*  MG  --  1.3* 2.3  PHOS  --  3.0 2.7  PROT  --  5.4*  --   ALBUMIN  --  1.4*  --   AST  --  56*  --   ALT  --  39  --   ALKPHOS  --  206*  --   BILITOT  --  0.8  --   PREALBUMIN  --  5.3*  --   TRIG  --  130  --    Estimated Creatinine Clearance: 43.4 mL/min (by C-G formula based on Cr of 1.11).    Recent Labs  05/13/15 2340 05/14/15 0353 05/14/15 0733  GLUCAP 110* 105* 99    Medical History: Past Medical History  Diagnosis Date  . Olivopontocerebellar atrophy (HCC)     Mgmt Dr Brett Fairy  . Diabetes mellitus     Type II. Insulin dependent. Diabetic nephropathy.    Insulin Requirements:  Lantus 40units bid, 95units/day in TPN, 17units of SSI last 24hrs (decreased)  Current Nutrition: NPO  IVF: NS at 25m/hr  Central access: PICC per IR TPN start date: 1/9  ASSESSMENT                                                                                                          HPI:  [id number] presents with N/V and abdominal  pain on 1/1, found to have gallstone pancreatitis.  CT revealing necrotizing pancreatitis. Surgery note form 1/8 states severe abdominal distention and ileus.  Pharmacy asked to start TPN on 1/7 but PICC has not been able to be placed by IV team.  Plans for IR to place PICC. Of note, she has DM on lantus and metformin prior to admission as well as hyperlipidemia on statin therapy.   Significant events:   1/12: Stool occurrence x2  1/13: NG removed.  Continues on D5W.  Not a reasonable fluid alternative at this point with chloride still elevated.  Had  discussion with MD and patient needs continued fluid replacement  1/16: D5W dc'd and Lantus increased to help control CBGs  Today, 05/14/2015:   Glucose - better overnight and this AM with multiple insulin adjustments.   Electrolytes - Na WNL and Cl only slightly high now. CO2 low and decreasing. K wnl but trending down. Corr Ca 12.7. Phos wnl. (Ca x Phos < 55). Mg wnl after 2g yest.   Renal - AKI resolving, UOP remains high.  LFTs - Alk Phos elevated, increasing; Albumin dropping  TGs - wnl  Prealbumin - worse at 5.3  NUTRITIONAL GOALS                                                                                           RD recs(updated 1/16): Kcal: 1600-1800 Protein: 75-85 grams Fluid: 1.8-2 L/day  Clinimix 5/15 at a goal rate of 70 ml/hr + 20% fat emulsion at 10 ml/hr to provide: 84 g/day protein, 1672 Kcal/day.  PLAN                                                                                                                         At 1800 today:  Cont Clinimix 5/15 at 70 ml/hr which is now the goal rate.   Keep electrolytes removed due to hypercalcemia   Decrease insulin to 85 units/24 hr   TPN to contain standard multivitamins and trace elements.  Cont 20% fat emulsion at 10 ml/hr.  Continue Lantus 40 units q12h (80 units/day).  Start D5W with 63mq KCL at 278mhr. (to protect against hypoglycemia and to  deliver 1943mKCl/24hrs)  Continue resistant SSI q4h.  Recheck BMET, Mg, Phos tomorrow  TPN lab panels on Mondays & Thursdays.  TomRomeo RabonharmD, pager [phone]/17/2017,8:22 AM.

## 2015-05-15 LAB — CBC
HCT: 25.5 % — ABNORMAL LOW (ref 36.0–46.0)
HEMOGLOBIN: 8.5 g/dL — AB (ref 12.0–15.0)
MCH: 28.1 pg (ref 26.0–34.0)
MCHC: 33.3 g/dL (ref 30.0–36.0)
MCV: 84.4 fL (ref 78.0–100.0)
PLATELETS: 586 10*3/uL — AB (ref 150–400)
RBC: 3.02 MIL/uL — AB (ref 3.87–5.11)
RDW: 13.5 % (ref 11.5–15.5)
WBC: 19.5 10*3/uL — AB (ref 4.0–10.5)

## 2015-05-15 LAB — BASIC METABOLIC PANEL
Anion gap: 9 (ref 5–15)
Anion gap: 7 (ref 5–15)
BUN: 35 mg/dL — AB (ref 6–20)
BUN: 36 mg/dL — AB (ref 6–20)
CHLORIDE: 118 mmol/L — AB (ref 101–111)
CHLORIDE: 116 mmol/L — AB (ref 101–111)
CO2: 15 mmol/L — ABNORMAL LOW (ref 22–32)
CO2: 16 mmol/L — ABNORMAL LOW (ref 22–32)
CREATININE: 1.19 mg/dL — AB (ref 0.44–1.00)
Calcium: 11.1 mg/dL — ABNORMAL HIGH (ref 8.9–10.3)
Calcium: 11.2 mg/dL — ABNORMAL HIGH (ref 8.9–10.3)
Creatinine, Ser: 1.16 mg/dL — ABNORMAL HIGH (ref 0.44–1.00)
GFR calc Af Amer: 54 mL/min — ABNORMAL LOW (ref >60)
GFR calc non Af Amer: 47 mL/min — ABNORMAL LOW (ref >60)
GFR, EST AFRICAN AMERICAN: 56 mL/min — AB (ref >60)
GFR, EST NON AFRICAN AMERICAN: 48 mL/min — AB (ref >60)
GLUCOSE: 443 mg/dL — AB (ref 65–99)
Glucose, Bld: 85 mg/dL (ref 65–99)
POTASSIUM: 3.5 mmol/L (ref 3.5–5.1)
Potassium: 4.2 mmol/L (ref 3.5–5.1)
SODIUM: 142 mmol/L (ref 135–145)
SODIUM: 139 mmol/L (ref 135–145)

## 2015-05-15 LAB — GLUCOSE, CAPILLARY
GLUCOSE-CAPILLARY: 103 mg/dL — AB (ref 65–99)
GLUCOSE-CAPILLARY: 110 mg/dL — AB (ref 65–99)
GLUCOSE-CAPILLARY: 113 mg/dL — AB (ref 65–99)
Glucose-Capillary: 119 mg/dL — ABNORMAL HIGH (ref 65–99)
Glucose-Capillary: 145 mg/dL — ABNORMAL HIGH (ref 65–99)

## 2015-05-15 LAB — PHOSPHORUS: PHOSPHORUS: 2.9 mg/dL (ref 2.5–4.6)

## 2015-05-15 LAB — MAGNESIUM: MAGNESIUM: 1.9 mg/dL (ref 1.7–2.4)

## 2015-05-15 MED ORDER — INSULIN GLARGINE 100 UNIT/ML ~~LOC~~ SOLN
25.0000 [IU] | Freq: Two times a day (BID) | SUBCUTANEOUS | Status: DC
  Administered 2015-05-15 (×2): 25 [IU] via SUBCUTANEOUS
  Filled 2015-05-15 (×3): qty 0.25

## 2015-05-15 MED ORDER — SODIUM BICARBONATE 8.4 % IV SOLN
INTRAVENOUS | Status: DC
  Administered 2015-05-15: 10:00:00 via INTRAVENOUS
  Filled 2015-05-15: qty 1000

## 2015-05-15 MED ORDER — FAT EMULSION 20 % IV EMUL
240.0000 mL | INTRAVENOUS | Status: AC
  Administered 2015-05-15: 240 mL via INTRAVENOUS
  Filled 2015-05-15: qty 250

## 2015-05-15 MED ORDER — CLINIMIX/DEXTROSE (5/15) 5 % IV SOLN
INTRAVENOUS | Status: AC
  Administered 2015-05-15: 18:00:00 via INTRAVENOUS
  Filled 2015-05-15: qty 1320

## 2015-05-15 NOTE — Progress Notes (Addendum)
PARENTERAL NUTRITION CONSULT NOTE - FOLLOW-UP  Pharmacy Consult for TPN Indication: severe necrotizing pancreatitis with ileus  No Known Allergies  Patient Measurements: Height: 5' 2"  (157.5 cm) Weight: 137 lb 12.6 oz (62.5 kg) IBW/kg (Calculated) : 50.1   Vital Signs: Temp: 100.1 F (37.8 C) (01/18 0359) Temp Source: Oral (01/18 0359) BP: 137/53 mmHg (01/18 0600) Pulse Rate: 109 (01/18 0600) Intake/Output from previous day: 01/17 0701 - 01/18 0700 In: 2808.5 [I.V.:433; IV Piggyback:450; TPN:1925.5] Out: 2950 [Urine:2950]    Labs:  Recent Labs  05/12/15 1044 05/13/15 0530 05/14/15 0555 05/15/15 0549  WBC  --  22.0* 21.0* 19.5*  HGB  --  7.1* 9.1* 8.5*  HCT  --  21.0* 26.6* 25.5*  PLT  --  504* 538* 586*  INR 1.18  --   --   --      Recent Labs  05/13/15 0530 05/14/15 0555 05/15/15 0549  NA 139 143 142  K 3.8 3.6 3.5  CL 115* 118* 118*  CO2 15* 14* 15*  GLUCOSE 173* 105* 85  BUN 34* 34* 35*  CREATININE 1.35* 1.11* 1.16*  CALCIUM 10.1 10.6* 11.1*  MG 1.3* 2.3 1.9  PHOS 3.0 2.7 2.9  PROT 5.4*  --   --   ALBUMIN 1.4*  --   --   AST 56*  --   --   ALT 39  --   --   ALKPHOS 206*  --   --   BILITOT 0.8  --   --   PREALBUMIN 5.3*  --   --   TRIG 130  --   --    Estimated Creatinine Clearance: 41.5 mL/min (by C-G formula based on Cr of 1.16).    Recent Labs  05/14/15 2004 05/14/15 2355 05/15/15 0356  GLUCAP 147* 122* 103*    Medical History: Past Medical History  Diagnosis Date  . Olivopontocerebellar atrophy (HCC)     Mgmt Dr Brett Fairy  . Diabetes mellitus     Type II. Insulin dependent. Diabetic nephropathy.    Insulin Requirements:  Lantus 40units bid, 85units/day in TPN, 12units of SSI last 24hrs (decreased)  Current Nutrition: NPO  IVF: D5W w/ 40KCl at 60m/hr  Central access: PICC per IR TPN start date: 1/9  ASSESSMENT                                                                                                          HPI:   [id number] presents with N/V and abdominal pain on 1/1, found to have gallstone pancreatitis.  CT revealing necrotizing pancreatitis. Surgery note form 1/8 states severe abdominal distention and ileus.  Pharmacy asked to start TPN on 1/7 but PICC has not been able to be placed by IV team.  Plans for IR to place PICC. Of note, she has DM on lantus and metformin prior to admission as well as hyperlipidemia on statin therapy.   Significant events:   1/12: Stool occurrence x2  1/13: NG removed.  Continues on D5W.  Not a reasonable fluid alternative at this point  with chloride still elevated.  Had discussion with MD and patient needs continued fluid replacement  1/16: D5W dc'd and Lantus increased to help control CBGs  Today, 05/15/2015:   Glucose - at goal <150. A little low this AM at 85.    Electrolytes - Cl remains elevated. CO2 remains low. K wnl, down-trend slowed. Corr Ca 13.2. Phos wnl. (Ca x Phos < 55). Mg wnl.   Renal - slight bump in SCr. AKI was resolving, UOP remains high. I don't see that she received any IV contrast prior to MRA attempt 1/17.  LFTs - Alk Phos elevated, increasing; Albumin dropping  TGs - wnl  Prealbumin - worse at 5.3  NUTRITIONAL GOALS                                                                                           RD recs(updated 1/16): Kcal: 1600-1800 Protein: 75-85 grams Fluid: 1.8-2 L/day  Clinimix 5/15 at a goal rate of 70 ml/hr + 20% fat emulsion at 10 ml/hr to provide: 84 g/day protein, 1672 Kcal/day.  PLAN                                                                                                                         At 1800 today:  Cont Clinimix 5/15 at 70 ml/hr which is now the goal rate.   Keep electrolytes removed due to hypercalcemia   Cont insulin at 85 units/24 hr   TPN to contain standard multivitamins and trace elements.  Cont 20% fat emulsion at 10 ml/hr.  Continue Lantus 40 units q12h (80 units/day) -> MD  reduced to 25units q12h.  Cont D5W with 84mq KCL at 226mhr. -> per discussion with MD, add bicarb to fluid and increase rate to 3038mr  Continue resistant SSI q4h.  TPN lab panels on Mondays & Thursdays.  TomRomeo RabonharmD, pager [phone]/18/2017,7:29 AM.

## 2015-05-15 NOTE — Progress Notes (Signed)
Patient ID: Victoria Morrow, female   DOB: Feb 24, 1951, 65 y.o.   MRN: [id number]     [address] [place], [place] [address]    Phone: [phone] FAX: [phone]     Subjective: Denies pain, n/v, passing flatus.  Appears more distended today. Tmax  100.4 last 24h.  WBC trending down.   Objective:  Vital signs:  Filed Vitals:   05/15/15 0359 05/15/15 0400 05/15/15 0500 05/15/15 0600  BP:  117/69  137/53  Pulse:  121  109  Temp: 100.1 F (37.8 C)     TempSrc: Oral     Resp:  30  28  Height:      Weight:   62.5 kg (137 lb 12.6 oz)   SpO2:  100%  100%    Last BM Date: 05/13/15  Intake/Output   Yesterday:  01/17 0701 - 01/18 0700 In: 2808.5 [I.V.:433; IV Piggyback:450; TPN:1925.5] Out: 2950 [Urine:2950] This shift:      Physical Exam: General: Pt awake/alert/oriented x4 in no acute distress  Abdomen: Soft. distended. Non tender. No evidence of peritonitis. No incarcerated hernias.   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HCC)   Abscess   Fever   Necrotizing pancreatitis    Results:   Labs: Results for orders placed or performed during the hospital encounter of 04/28/15 (from the past 48 hour(s))  Prepare RBC     Status: None   Collection Time: 05/13/15 11:00 AM  Result Value Ref Range   Order Confirmation ORDER PROCESSED BY BLOOD BANK   Glucose, capillary     Status: Abnormal   Collection Time: 05/13/15 12:13 PM   Result Value Ref Range   Glucose-Capillary 134 (H) 65 - 99 mg/dL  ABO/Rh     Status: None   Collection Time: 05/13/15  2:10 PM  Result Value Ref Range   ABO/RH(D) B POS   Type and screen [place]     Status: None   Collection Time: 05/13/15  2:46 PM  Result Value Ref Range   ABO/RH(D) B POS    Antibody Screen NEG    Sample Expiration 05/16/2015    Unit Number [id number]    Blood Component Type RED CELLS,LR    Unit division 00    Status of Unit ISSUED,FINAL    Transfusion Status OK TO TRANSFUSE    Crossmatch Result Compatible   Glucose, capillary     Status: Abnormal   Collection Time: 05/13/15  4:03 PM  Result Value Ref Range   Glucose-Capillary 151 (H) 65 - 99 mg/dL  Glucose, capillary     Status: Abnormal   Collection Time: 05/13/15  7:31 PM  Result Value Ref Range   Glucose-Capillary 121 (H) 65 - 99 mg/dL  Glucose, capillary     Status: Abnormal   Collection Time: 05/13/15 11:40 PM  Result Value Ref Range   Glucose-Capillary 110 (H) 65 - 99 mg/dL  Glucose, capillary  Status: Abnormal   Collection Time: 05/14/15  3:53 AM  Result Value Ref Range   Glucose-Capillary 105 (H) 65 - 99 mg/dL  CBC     Status: Abnormal   Collection Time: 05/14/15  5:55 AM  Result Value Ref Range   WBC 21.0 (H) 4.0 - 10.5 K/uL   RBC 3.19 (L) 3.87 - 5.11 MIL/uL   Hemoglobin 9.1 (L) 12.0 - 15.0 g/dL    Comment: REPEATED TO VERIFY DELTA CHECK NOTED POST TRANSFUSION SPECIMEN    HCT 26.6 (L) 36.0 - 46.0 %   MCV 83.4 78.0 - 100.0 fL   MCH 28.5 26.0 - 34.0 pg   MCHC 34.2 30.0 - 36.0 g/dL   RDW 13.1 11.5 - 15.5 %   Platelets 538 (H) 150 - 400 K/uL  Basic metabolic panel     Status: Abnormal   Collection Time: 05/14/15  5:55 AM  Result Value Ref Range   Sodium 143 135 - 145 mmol/L   Potassium 3.6 3.5 - 5.1 mmol/L   Chloride 118 (H) 101 - 111 mmol/L   CO2 14 (L) 22 - 32 mmol/L   Glucose, Bld 105 (H) 65 - 99 mg/dL   BUN 34 (H) 6 - 20 mg/dL   Creatinine, Ser 1.11  (H) 0.44 - 1.00 mg/dL   Calcium 10.6 (H) 8.9 - 10.3 mg/dL   GFR calc non Af Amer 51 (L) >60 mL/min   GFR calc Af Amer 59 (L) >60 mL/min    Comment: (NOTE) The eGFR has been calculated using the CKD EPI equation. This calculation has not been validated in all clinical situations. eGFR's persistently <60 mL/min signify possible Chronic Kidney Disease.    Anion gap 11 5 - 15  Magnesium     Status: None   Collection Time: 05/14/15  5:55 AM  Result Value Ref Range   Magnesium 2.3 1.7 - 2.4 mg/dL  Phosphorus     Status: None   Collection Time: 05/14/15  5:55 AM  Result Value Ref Range   Phosphorus 2.7 2.5 - 4.6 mg/dL  Glucose, capillary     Status: None   Collection Time: 05/14/15  7:33 AM  Result Value Ref Range   Glucose-Capillary 99 65 - 99 mg/dL   Comment 1 Notify RN   Glucose, capillary     Status: Abnormal   Collection Time: 05/14/15 11:23 AM  Result Value Ref Range   Glucose-Capillary 126 (H) 65 - 99 mg/dL   Comment 1 Notify RN   Glucose, capillary     Status: Abnormal   Collection Time: 05/14/15  3:50 PM  Result Value Ref Range   Glucose-Capillary 144 (H) 65 - 99 mg/dL   Comment 1 Notify RN   Glucose, capillary     Status: Abnormal   Collection Time: 05/14/15  8:04 PM  Result Value Ref Range   Glucose-Capillary 147 (H) 65 - 99 mg/dL   Comment 1 Notify RN   Glucose, capillary     Status: Abnormal   Collection Time: 05/14/15 11:55 PM  Result Value Ref Range   Glucose-Capillary 122 (H) 65 - 99 mg/dL   Comment 1 Notify RN   Glucose, capillary     Status: Abnormal   Collection Time: 05/15/15  3:56 AM  Result Value Ref Range   Glucose-Capillary 103 (H) 65 - 99 mg/dL  CBC     Status: Abnormal   Collection Time: 05/15/15  5:49 AM  Result Value Ref Range   WBC 19.5 (H) 4.0 -  10.5 K/uL   RBC 3.02 (L) 3.87 - 5.11 MIL/uL   Hemoglobin 8.5 (L) 12.0 - 15.0 g/dL   HCT 25.5 (L) 36.0 - 46.0 %   MCV 84.4 78.0 - 100.0 fL   MCH 28.1 26.0 - 34.0 pg   MCHC 33.3 30.0 - 36.0 g/dL    RDW 13.5 11.5 - 15.5 %   Platelets 586 (H) 150 - 400 K/uL  Basic metabolic panel     Status: Abnormal   Collection Time: 05/15/15  5:49 AM  Result Value Ref Range   Sodium 142 135 - 145 mmol/L   Potassium 3.5 3.5 - 5.1 mmol/L   Chloride 118 (H) 101 - 111 mmol/L   CO2 15 (L) 22 - 32 mmol/L   Glucose, Bld 85 65 - 99 mg/dL   BUN 35 (H) 6 - 20 mg/dL   Creatinine, Ser 1.16 (H) 0.44 - 1.00 mg/dL   Calcium 11.1 (H) 8.9 - 10.3 mg/dL   GFR calc non Af Amer 48 (L) >60 mL/min   GFR calc Af Amer 56 (L) >60 mL/min    Comment: (NOTE) The eGFR has been calculated using the CKD EPI equation. This calculation has not been validated in all clinical situations. eGFR's persistently <60 mL/min signify possible Chronic Kidney Disease.    Anion gap 9 5 - 15  Magnesium     Status: None   Collection Time: 05/15/15  5:49 AM  Result Value Ref Range   Magnesium 1.9 1.7 - 2.4 mg/dL  Phosphorus     Status: None   Collection Time: 05/15/15  5:49 AM  Result Value Ref Range   Phosphorus 2.9 2.5 - 4.6 mg/dL    Imaging / Studies: No results found.  Medications / Allergies:  Scheduled Meds: . antiseptic oral rinse  7 mL Mouth Rinse q12n4p  . aspirin  81 mg Oral Daily  . chlorhexidine  15 mL Mouth Rinse BID  . famotidine (PEPCID) IV  20 mg Intravenous Q24H  . fluconazole (DIFLUCAN) IV  100 mg Intravenous Q24H  . heparin  5,000 Units Subcutaneous 3 times per day  . insulin aspart  0-20 Units Subcutaneous 6 times per day  . insulin glargine  40 Units Subcutaneous BID  . meropenem (MERREM) IV  1 g Intravenous Q12H  . metoprolol  5 mg Intravenous 4 times per day  . sodium chloride  10-40 mL Intracatheter Q12H  . sodium chloride  3 mL Intravenous Q12H   Continuous Infusions: . dextrose 5 % with kcl 20 mL/hr at 05/14/15 0921  . TPN (CLINIMIX) Adult without lytes 70 mL/hr at 05/14/15 2300   And  . fat emulsion 240 mL (05/14/15 2300)  . TPN (CLINIMIX) Adult without lytes     And  . fat emulsion     PRN  Meds:.acetaminophen **OR** acetaminophen, bismuth subsalicylate, diphenhydrAMINE, fentaNYL (SUBLIMAZE) injection, hydrALAZINE, levalbuterol, metoprolol, ondansetron (ZOFRAN) IV **OR** ondansetron (ZOFRAN) IV, promethazine, sodium chloride  Antibiotics: Anti-infectives    Start     Dose/Rate Route Frequency Ordered Stop   05/13/15 1000  fluconazole (DIFLUCAN) IVPB 100 mg     100 mg 50 mL/hr over 60 Minutes Intravenous Every 24 hours 05/12/15 0813 05/17/15 0959   05/12/15 1000  fluconazole (DIFLUCAN) IVPB 200 mg     200 mg 100 mL/hr over 60 Minutes Intravenous  Once 05/12/15 0813 05/12/15 1029   05/08/15 2200  meropenem (MERREM) 1 g in sodium chloride 0.9 % 100 mL IVPB     1 g 200  mL/hr over 30 Minutes Intravenous Every 12 hours 05/08/15 1054     05/03/15 1200  vancomycin (VANCOCIN) 50 mg/mL oral solution 500 mg  Status:  Discontinued     500 mg Oral 4 times per day 05/03/15 0917 05/04/15 1306   05/02/15 1200  cefTRIAXone (ROCEPHIN) 1 g in dextrose 5 % 50 mL IVPB  Status:  Discontinued     1 g 100 mL/hr over 30 Minutes Intravenous Every 24 hours 05/01/15 1448 05/01/15 2004   05/02/15 0600  imipenem-cilastatin (PRIMAXIN) 250 mg in sodium chloride 0.9 % 100 mL IVPB  Status:  Discontinued     250 mg 200 mL/hr over 30 Minutes Intravenous 3 times per day 05/01/15 2025 05/02/15 0027   05/02/15 0030  meropenem (MERREM) 500 mg in sodium chloride 0.9 % 50 mL IVPB  Status:  Discontinued     500 mg 100 mL/hr over 30 Minutes Intravenous Every 12 hours 05/02/15 0027 05/08/15 1054   05/01/15 2030  imipenem-cilastatin (PRIMAXIN) 250 mg in sodium chloride 0.9 % 100 mL IVPB     250 mg 200 mL/hr over 30 Minutes Intravenous NOW 05/01/15 2025 05/01/15 2110   04/30/15 1100  metroNIDAZOLE (FLAGYL) IVPB 500 mg  Status:  Discontinued     500 mg 100 mL/hr over 60 Minutes Intravenous Every 12 hours 04/30/15 0951 05/02/15 0751   04/30/15 1000  cefTRIAXone (ROCEPHIN) 1 g in dextrose 5 % 50 mL IVPB  Status:   Discontinued     1 g 100 mL/hr over 30 Minutes Intravenous Every 24 hours 04/30/15 0939 05/01/15 1205   04/30/15 0945  cefTRIAXone (ROCEPHIN) injection 1 g  Status:  Discontinued     1 g Intramuscular Every 24 hours 04/30/15 0930 04/30/15 0938   04/29/15 0200  imipenem-cilastatin (PRIMAXIN) 250 mg in sodium chloride 0.9 % 100 mL IVPB  Status:  Discontinued     250 mg 200 mL/hr over 30 Minutes Intravenous Every 8 hours 04/28/15 1812 04/30/15 0930   04/28/15 1830  imipenem-cilastatin (PRIMAXIN) 250 mg in sodium chloride 0.9 % 100 mL IVPB     250 mg 200 mL/hr over 30 Minutes Intravenous  Once 04/28/15 1812 04/28/15 1904        Assessment/Plan Acute necrotizing pancreatitis with SIRS Gallstones -unable to tolerate MRI.  Plan for CT 1-2 days. sCr 1.16 today.  Would prefer contrasted CT if okay with medicine  -continue atbx, bowel rest, TPN.   Erby Pian, [facility] Pager [phone](7A-4:30P)   05/15/2015 8:00 AM

## 2015-05-15 NOTE — Progress Notes (Signed)
[facility] PROGRESS NOTE  Victoria Morrow JJO:[id number] DOB: Jan 30, 1951 DOA: 04/28/2015 PCP: Rachell Cipro, MD  HPI/Brief narrative 65 year old African-American female with a past medical history of type 2 diabetes on insulin, chronic kidney disease of unknown stage, presented with abdominal pain, nausea and vomiting. She was found to have severe pancreatitis thought to be biliary in origin. She was hospitalized for further management. Critical care medicine was consulted. 1/4 vomited, aspirated, intubated, shock  Required neo since 1/7, transferred care under critical care 1/5 extubated 1/7 SVT requiring cardizem gtt 1/8 Vomiting - NG to LIS 1/12 off cardizem gtt 1/14 care transferred to [facility], with critical care and general surgery following   Assessment/Plan:  necrotizing pancreatitis -CT scan with evidence of gallstones. Patient denies any history of alcohol intake. Ultrasound of the abdomen demonstrates GB thickening with pericholecystic fluid and 49m stone lodged in neck of gallbladder and no Murphy's sign. CBD is normal size.  -General surgery is following. No clear evidence for acute cholecystitis.  -T continue on abx, tpn, diflucan added on 1/14, bowel rest. -patient not able to tolerate MRI of abdomen due to not able to hold breath for longer duration. -- Plan to perform CT abdomen to further evaluate for infection, abscess.    Fever:  Continue to have mild fevers.  Repeated culture pending, ua +yeast,  started diflucan,   ct chest/ab/pel on 1/14:  pancreatic necrosis with poor definition of the head and body the pancreas with increased low-attenuation edema compared to prior exam. 2. Increased fluid along the pericolic gutters. The are no clear organized pseudocyst at this point. The RIGHT pericolic gutter collection is the most organized.  IR consulted  for fluids collection drain and culture. UKoreaguided para was done on 1/15, culture pending. No growth to date,    Metabolic acidosis.; Will add bicarb to IV fluids. Repeat B-met this afternoon.   Anemia: likely anemia of chronic disease, patient remain sinus tachycardia, tachypenic, transfuse 1prbc on 1/15 Hb stable at 8.5.   Hypomagnesemia: replace mag.  Septic shock: required intubation and pressors. Now off pressors, extubated. Remain sinus tachcyaardia, tachypneic, febrile, leukocytosis.  VDRF; intubated on 1/14 and extubated on 1/15  PAF/SVT:  required cardizem drip, now on lopressor.  [facility] dynamic obstruction reported, grade 1 diastolic dysfunction reported.  increase betablocker iv lopressor from q8hrs to q6hr on 1/17, continue monitor bp and heart rate.  UTI: kleb pneu finished abx treatment  Urinary retention: urology consulted initially during hospitalization due to difficult foley placed placement. She is found to have grade 2 cystocele and urethral stenosis.  ARF on CKD III; better, cr now back to baseline, cr 2.8 on admission, nadir 1.18 on 1/14, today 1,1.  Transaminitis LFTs are slightly improved. Probably related to her necrotizing pancreatitis/sepsis/tpn. Ultrasound of the right upper quadrant as above. Hepatitis panel is negative.  Continue to trend LFTs.  Insulin dependent Diabetes mellitus type 2, poorly controlled, with chronic kidney disease . HbA1c is 7.7.  Continue insulin, adjust prn  insulin, TPN Will decrease lantus to 25 units BID, CBG in the 80. Also getting insulin with TPN.  Adjust insulin as needed. Appreciate Pharmacy assistance.   Code Status: Full Family Communication: Patient Disposition Plan:  remain in stepdown treatment for necrotizing pancreatitis.    Consultants:  TRH-1/4PCCM -1/14TRH  General Surgery  Urology  Procedures:  Intubation /extubation/ng  Central line  tpn  UKoreaguided para on 1/15   Antibiotics:  Anti-infectives    Start     Dose/Rate Route Frequency  Ordered Stop   05/13/15 1000  fluconazole (DIFLUCAN) IVPB 100 mg     100 mg 50 mL/hr over 60 Minutes Intravenous Every 24 hours 05/12/15 0813 05/17/15 0959   05/12/15 1000  fluconazole (DIFLUCAN) IVPB 200 mg     200 mg 100 mL/hr over 60 Minutes Intravenous  Once 05/12/15 0813 05/12/15 1029   05/08/15 2200  meropenem (MERREM) 1 g in sodium chloride 0.9 % 100 mL IVPB     1 g 200 mL/hr over 30 Minutes Intravenous Every 12 hours 05/08/15 1054     05/03/15 1200  vancomycin (VANCOCIN) 50 mg/mL oral solution 500 mg  Status:  Discontinued     500 mg Oral 4 times per day 05/03/15 0917 05/04/15 1306   05/02/15 1200  cefTRIAXone (ROCEPHIN) 1 g in dextrose 5 % 50 mL IVPB  Status:  Discontinued     1 g 100 mL/hr over 30 Minutes Intravenous Every 24 hours 05/01/15 1448 05/01/15 2004   05/02/15 0600  imipenem-cilastatin (PRIMAXIN) 250 mg in sodium chloride 0.9 % 100 mL IVPB  Status:  Discontinued     250 mg 200 mL/hr over 30 Minutes Intravenous 3 times per day 05/01/15 2025 05/02/15 0027   05/02/15 0030  meropenem (MERREM) 500 mg in sodium chloride 0.9 % 50 mL IVPB  Status:  Discontinued     500 mg 100 mL/hr over 30 Minutes Intravenous Every 12 hours 05/02/15 0027 05/08/15 1054   05/01/15 2030  imipenem-cilastatin (PRIMAXIN) 250 mg in sodium chloride 0.9 % 100 mL IVPB     250 mg 200 mL/hr over 30 Minutes Intravenous NOW 05/01/15 2025 05/01/15 2110   04/30/15 1100  metroNIDAZOLE (FLAGYL) IVPB 500 mg  Status:  Discontinued     500 mg 100 mL/hr over 60 Minutes Intravenous Every 12 hours 04/30/15 0951 05/02/15 0751   04/30/15 1000  cefTRIAXone (ROCEPHIN) 1 g in dextrose 5 % 50 mL IVPB  Status:  Discontinued     1 g 100 mL/hr over 30 Minutes Intravenous Every 24 hours 04/30/15 0939 05/01/15 1205   04/30/15 0945  cefTRIAXone (ROCEPHIN) injection 1 g  Status:  Discontinued     1 g Intramuscular Every 24 hours 04/30/15 0930 04/30/15 0938   04/29/15 0200  imipenem-cilastatin (PRIMAXIN) 250 mg in sodium  chloride 0.9 % 100 mL IVPB  Status:  Discontinued     250 mg 200 mL/hr over 30 Minutes Intravenous Every 8 hours 04/28/15 1812 04/30/15 0930   04/28/15 1830  imipenem-cilastatin (PRIMAXIN) 250 mg in sodium chloride 0.9 % 100 mL IVPB     250 mg 200 mL/hr over 30 Minutes Intravenous  Once 04/28/15 1812 04/28/15 1904      HPI/Subjective: Patient alert, with tachypnea, report breathing ok at times.  Denies abdominal pain.   Objective: Filed Vitals:   05/15/15 0359 05/15/15 0400 05/15/15 0500 05/15/15 0600  BP:  117/69  137/53  Pulse:  121  109  Temp: 100.1 F (37.8 C)     TempSrc: Oral     Resp:  30  28  Height:      Weight:   62.5 kg (137 lb 12.6 oz)   SpO2:  100%  100%    Intake/Output Summary (Last 24 hours) at 05/15/15 0816 Last data filed at 05/15/15 0700  Gross per 24 hour  Intake 2808.49 ml  Output   2950 ml  Net -141.51 ml   [employer]  05/09/15 0553 05/12/15 0923 05/15/15 0500  Weight: 63.2 kg (139 lb 5.3 oz) 62.5 kg (137 lb 12.6 oz) 62.5 kg (137 lb 12.6 oz)    Exam:   General:  Very frail, tachypnea.   Cardiovascular: tachycardic, s1, s2  Respiratory: decreased at basis, no wheezing, no rales, no rhonchi  Abdomen: obese, decreased BS, non tender  Musculoskeletal: perfused, no clubbing   Data Reviewed: Basic Metabolic Panel:  Recent Labs Lab 05/09/15 0400 05/10/15 0412 05/11/15 0305 05/12/15 0422 05/13/15 0530 05/14/15 0555 05/15/15 0549  NA 151* 149* 145 141 139 143 142  K 4.5 4.5 4.1 4.1 3.8 3.6 3.5  CL 124* 123* 118* 116* 115* 118* 118*  CO2 18* 18* 18* 16* 15* 14* 15*  GLUCOSE 282* 235* 254* 181* 173* 105* 85  BUN 28* 29* 28* 28* 34* 34* 35*  CREATININE 1.40* 1.28* 1.18* 1.33* 1.35* 1.11* 1.16*  CALCIUM 10.3 10.3 10.6* 10.5* 10.1 10.6* 11.1*  MG 1.8 1.7  --   --  1.3* 2.3 1.9  PHOS 3.1 3.3  --   --  3.0 2.7 2.9   Liver Function Tests:  Recent Labs Lab 05/09/15 0400 05/13/15 0530  AST 48* 56*  ALT 26 39  ALKPHOS 141* 206*   BILITOT 0.8 0.8  PROT 5.7* 5.4*  ALBUMIN 1.7* 1.4*    Recent Labs Lab 05/12/15 0422  LIPASE 44   No results for input(s): AMMONIA in the last 168 hours. CBC:  Recent Labs Lab 05/10/15 0412 05/11/15 0500 05/13/15 0530 05/14/15 0555 05/15/15 0549  WBC 20.1* 20.9* 22.0* 21.0* 19.5*  NEUTROABS  --   --  18.1*  --   --   HGB 7.8* 8.1* 7.1* 9.1* 8.5*  HCT 24.1* 24.9* 21.0* 26.6* 25.5*  MCV 87.0 85.6 82.4 83.4 84.4  PLT 556* 558* 504* 538* 586*   Cardiac Enzymes: No results for input(s): CKTOTAL, CKMB, CKMBINDEX, TROPONINI in the last 168 hours. BNP (last 3 results) No results for input(s): BNP in the last 8760 hours.  ProBNP (last 3 results) No results for input(s): PROBNP in the last 8760 hours.  CBG:  Recent Labs Lab 05/14/15 1123 05/14/15 1550 05/14/15 2004 05/14/15 2355 05/15/15 0356  GLUCAP 126* 144* 147* 122* 103*    Recent Results (from the past 240 hour(s))  Culture, blood (routine x 2)     Status: None (Preliminary result)   Collection Time: 05/11/15 10:43 AM  Result Value Ref Range Status   Specimen Description BLOOD BLOOD LEFT HAND  Final   Special Requests BOTTLES DRAWN AEROBIC ONLY [place]  Final   Culture   Final    NO GROWTH 3 DAYS Performed at [facility]    Report Status PENDING  Incomplete  Culture, blood (routine x 2)     Status: None (Preliminary result)   Collection Time: 05/11/15 10:50 AM  Result Value Ref Range Status   Specimen Description BLOOD BLOOD LEFT HAND  Final   Special Requests IN PEDIATRIC BOTTLE 3CC  Final   Culture   Final    NO GROWTH 3 DAYS Performed at [facility]    Report Status PENDING  Incomplete  Culture, body fluid-bottle     Status: None (Preliminary result)   Collection Time: 05/12/15 12:43 PM  Result Value Ref Range Status   Specimen Description FLUID PERITONEAL  Final   Special Requests NONE  Final   Culture   Final    NO GROWTH 2 DAYS Performed at [facility]  Report Status  PENDING  Incomplete  Gram stain     Status: None   Collection Time: 05/12/15 12:43 PM  Result Value Ref Range Status   Specimen Description FLUID PERITONEAL  Final   Special Requests NONE  Final   Gram Stain   Final    FEW WBC PRESENT, PREDOMINANTLY MONONUCLEAR NO ORGANISMS SEEN Performed at [facility]    Report Status 05/12/2015 FINAL  Final     Studies: No results found.  Scheduled Meds: . antiseptic oral rinse  7 mL Mouth Rinse q12n4p  . aspirin  81 mg Oral Daily  . chlorhexidine  15 mL Mouth Rinse BID  . famotidine (PEPCID) IV  20 mg Intravenous Q24H  . fluconazole (DIFLUCAN) IV  100 mg Intravenous Q24H  . heparin  5,000 Units Subcutaneous 3 times per day  . insulin aspart  0-20 Units Subcutaneous 6 times per day  . insulin glargine  40 Units Subcutaneous BID  . meropenem (MERREM) IV  1 g Intravenous Q12H  . metoprolol  5 mg Intravenous 4 times per day  . sodium chloride  10-40 mL Intracatheter Q12H  . sodium chloride  3 mL Intravenous Q12H   Continuous Infusions: . dextrose 5 % with kcl 20 mL/hr at 05/14/15 0921  . TPN (CLINIMIX) Adult without lytes 70 mL/hr at 05/14/15 2300   And  . fat emulsion 240 mL (05/14/15 2300)  . TPN (CLINIMIX) Adult without lytes     And  . fat emulsion      Principal Problem:   Acute necrotizing pancreatitis with SIRS Active Problems:   Gallstones   Transaminitis   DM type 2 (diabetes mellitus, type 2) (HCC)   Hyperlipidemia   ARF (acute renal failure) (HCC)   Acute respiratory failure with hypoxia (HCC)   Aspiration pneumonia due to vomit (HCC)   Acute on chronic respiratory failure (HCC)   Protein-calorie malnutrition, moderate (HCC)   Umbilical hernia   Chronic kidney disease (CKD), stage III (moderate)   Olivopontocerebellar atrophy (HCC)   Abscess   Fever   Necrotizing pancreatitis    Niel Hummer A MD  [facility] Pager [phone] If 7PM-7AM, please contact night-coverage at [email or web address],  password [facility] 05/15/2015, 8:16 AM  LOS: 17 days

## 2015-05-16 ENCOUNTER — Inpatient Hospital Stay (HOSPITAL_COMMUNITY): Payer: Commercial Managed Care - HMO

## 2015-05-16 ENCOUNTER — Encounter (HOSPITAL_COMMUNITY): Payer: Self-pay | Admitting: Radiology

## 2015-05-16 LAB — COMPREHENSIVE METABOLIC PANEL
ALT: 48 U/L (ref 14–54)
AST: 58 U/L — AB (ref 15–41)
Albumin: 1.6 g/dL — ABNORMAL LOW (ref 3.5–5.0)
Alkaline Phosphatase: 236 U/L — ABNORMAL HIGH (ref 38–126)
Anion gap: 9 (ref 5–15)
BILIRUBIN TOTAL: 1.3 mg/dL — AB (ref 0.3–1.2)
BUN: 37 mg/dL — AB (ref 6–20)
CO2: 15 mmol/L — ABNORMAL LOW (ref 22–32)
CREATININE: 1.07 mg/dL — AB (ref 0.44–1.00)
Calcium: 11.9 mg/dL — ABNORMAL HIGH (ref 8.9–10.3)
Chloride: 119 mmol/L — ABNORMAL HIGH (ref 101–111)
GFR, EST NON AFRICAN AMERICAN: 53 mL/min — AB (ref >60)
Glucose, Bld: 169 mg/dL — ABNORMAL HIGH (ref 65–99)
Potassium: 3.7 mmol/L (ref 3.5–5.1)
Sodium: 143 mmol/L (ref 135–145)
TOTAL PROTEIN: 6.3 g/dL — AB (ref 6.5–8.1)

## 2015-05-16 LAB — CBC
HCT: 25.5 % — ABNORMAL LOW (ref 36.0–46.0)
Hemoglobin: 8.4 g/dL — ABNORMAL LOW (ref 12.0–15.0)
MCH: 28.2 pg (ref 26.0–34.0)
MCHC: 32.9 g/dL (ref 30.0–36.0)
MCV: 85.6 fL (ref 78.0–100.0)
PLATELETS: 558 10*3/uL — AB (ref 150–400)
RBC: 2.98 MIL/uL — ABNORMAL LOW (ref 3.87–5.11)
RDW: 13.8 % (ref 11.5–15.5)
WBC: 17 10*3/uL — ABNORMAL HIGH (ref 4.0–10.5)

## 2015-05-16 LAB — CULTURE, BLOOD (ROUTINE X 2)
CULTURE: NO GROWTH
Culture: NO GROWTH

## 2015-05-16 LAB — GLUCOSE, CAPILLARY
GLUCOSE-CAPILLARY: 130 mg/dL — AB (ref 65–99)
GLUCOSE-CAPILLARY: 163 mg/dL — AB (ref 65–99)
Glucose-Capillary: 141 mg/dL — ABNORMAL HIGH (ref 65–99)
Glucose-Capillary: 154 mg/dL — ABNORMAL HIGH (ref 65–99)
Glucose-Capillary: 121 mg/dL — ABNORMAL HIGH (ref 65–99)
Glucose-Capillary: 130 mg/dL — ABNORMAL HIGH (ref 65–99)

## 2015-05-16 LAB — BASIC METABOLIC PANEL
Anion gap: 11 (ref 5–15)
BUN: 33 mg/dL — AB (ref 6–20)
CHLORIDE: 113 mmol/L — AB (ref 101–111)
CO2: 16 mmol/L — AB (ref 22–32)
CREATININE: 0.93 mg/dL (ref 0.44–1.00)
Calcium: 10.3 mg/dL (ref 8.9–10.3)
GFR calc non Af Amer: >60 mL/min (ref >60)
GLUCOSE: 431 mg/dL — AB (ref 65–99)
Potassium: 3.9 mmol/L (ref 3.5–5.1)
Sodium: 140 mmol/L (ref 135–145)

## 2015-05-16 LAB — PHOSPHORUS: PHOSPHORUS: 3.2 mg/dL (ref 2.5–4.6)

## 2015-05-16 LAB — MAGNESIUM: MAGNESIUM: 1.8 mg/dL (ref 1.7–2.4)

## 2015-05-16 MED ORDER — CALCITONIN (SALMON) 200 UNIT/ML IJ SOLN
100.0000 [IU] | Freq: Every day | INTRAMUSCULAR | Status: AC
  Administered 2015-05-16 – 2015-05-17 (×2): 100 [IU] via INTRAMUSCULAR
  Filled 2015-05-16 (×3): qty 0.5

## 2015-05-16 MED ORDER — STERILE WATER FOR INJECTION IV SOLN
INTRAVENOUS | Status: DC
  Filled 2015-05-16: qty 850

## 2015-05-16 MED ORDER — IOHEXOL 300 MG/ML  SOLN
100.0000 mL | Freq: Once | INTRAMUSCULAR | Status: AC | PRN
  Administered 2015-05-16: 100 mL via INTRAVENOUS

## 2015-05-16 MED ORDER — STERILE WATER FOR INJECTION IV SOLN
INTRAVENOUS | Status: DC
  Administered 2015-05-16 – 2015-05-17 (×2): via INTRAVENOUS
  Filled 2015-05-16 (×3): qty 840

## 2015-05-16 MED ORDER — FAT EMULSION 20 % IV EMUL
240.0000 mL | INTRAVENOUS | Status: AC
  Administered 2015-05-16: 240 mL via INTRAVENOUS
  Filled 2015-05-16: qty 250

## 2015-05-16 MED ORDER — TRACE MINERALS CR-CU-MN-SE-ZN 10-1000-500-60 MCG/ML IV SOLN
INTRAVENOUS | Status: AC
  Administered 2015-05-16: 17:00:00 via INTRAVENOUS
  Filled 2015-05-16: qty 1320

## 2015-05-16 MED ORDER — INSULIN GLARGINE 100 UNIT/ML ~~LOC~~ SOLN
20.0000 [IU] | Freq: Two times a day (BID) | SUBCUTANEOUS | Status: DC
  Administered 2015-05-16 – 2015-05-21 (×12): 20 [IU] via SUBCUTANEOUS
  Filled 2015-05-16 (×15): qty 0.2

## 2015-05-16 NOTE — Progress Notes (Signed)
Physical Therapy Treatment Patient Details Name: RAMONDA GALYON MRN: [id number] DOB: 12-01-1950 Today's Date: 05/16/2015    History of Present Illness 65 yo female admitted 04/28/15 with necrotizing pancreatitis. required ventilator  for aspiration. Extubated 1/5.    PT Comments    Today  The patient is much more alert and able to sit at edge of the bed once assisted there with 2 persons. Remains profoundly weak for attempts to stand and pivot.  HR 129, RR33 with activity.  Follow Up Recommendations  SNF;Supervision/Assistance - 24 hour     Equipment Recommendations  None recommended by PT    Recommendations for Other Services       Precautions / Restrictions Precautions Precautions: Fall Precaution Comments: multi lines, monitor VS, increased HR and RR    Mobility  Bed Mobility Overal bed mobility: Needs Assistance;+2 for physical assistance;+ 2 for safety/equipment Bed Mobility: Rolling;Sidelying to Sit Rolling: Max assist Sidelying to sit: Max assist;+2 for physical assistance;+2 for safety/equipment;HOB elevated       General bed mobility comments: patient did assist with reaching and attempting to push up from Left elbow. Assist for trunk to assume upright.  Transfers Overall transfer level: Needs assistance               General transfer comment: used Maxisky as patient was  remains very weak but much imprived from last visit. HR 129. WU98  sats > 94 RA  Ambulation/Gait                 Stairs            Wheelchair Mobility    Modified Rankin (Stroke Patients Only)       Balance Overall balance assessment: Needs assistance Sitting-balance support: Feet supported;Bilateral upper extremity supported Sitting balance-Leahy Scale: Poor Sitting balance - Comments: patient did maintain balance at midline for 1-2 minutes once upright and balanced.                            Cognition Arousal/Alertness: Awake/alert Behavior During  Therapy: WFL for tasks assessed/performed (is laughuing and more engaged.) Overall Cognitive Status: Difficult to assess                      Exercises General Exercises - Lower Extremity Long Arc Quad: Both;5 reps;Seated;AAROM Straight Leg Raises: AAROM;Both;5 reps;Supine    General Comments        Pertinent Vitals/Pain Faces Pain Scale: Hurts little more Pain Location: abdomen Pain Intervention(s): Limited activity within patient's tolerance;Monitored during session;Repositioned    Home Living                      Prior Function            PT Goals (current goals can now be found in the care plan section) Acute Rehab PT Goals Patient Stated Goal: to get stronger. Progress towards PT goals: Progressing toward goals    Frequency  Min 3X/week    PT Plan Current plan remains appropriate    Co-evaluation             End of Session   Activity Tolerance: Patient tolerated treatment well;Patient limited by fatigue Patient left: in chair;with call bell/phone within reach;with chair alarm set     Time: 1191-4782 PT Time Calculation (min) (ACUTE ONLY): 17 min  Charges:  $Therapeutic Activity: 8-22 mins  G Codes:      Rada Hay 05/16/2015, 3:59 PM Blanchard Kelch PT [phone]

## 2015-05-16 NOTE — Progress Notes (Signed)
PARENTERAL NUTRITION CONSULT NOTE - FOLLOW-UP  Pharmacy Consult for TPN Indication: severe necrotizing pancreatitis with ileus  No Known Allergies  Patient Measurements: Height: 5' 2"  (157.5 cm) Weight: 137 lb 12.6 oz (62.5 kg) IBW/kg (Calculated) : 50.1   Vital Signs: Temp: 99.4 F (37.4 C) (01/19 0810) Temp Source: Axillary (01/19 0810) BP: 148/72 mmHg (01/19 0800) Pulse Rate: 98 (01/19 0800) Intake/Output from previous day: 01/18 0701 - 01/19 0700 In: 2741.2 [I.V.:600; IV Piggyback:300; TPN:1841.2] Out: 1850 [Urine:1850]    Labs:  Recent Labs  05/14/15 0555 05/15/15 0549 05/16/15 0515  WBC 21.0* 19.5* 17.0*  HGB 9.1* 8.5* 8.4*  HCT 26.6* 25.5* 25.5*  PLT 538* 586* 558*     Recent Labs  05/14/15 0555 05/15/15 0549 05/15/15 1831 05/16/15 0515  NA 143 142 139 143  K 3.6 3.5 4.2 3.7  CL 118* 118* 116* 119*  CO2 14* 15* 16* 15*  GLUCOSE 105* 85 443* 169*  BUN 34* 35* 36* 37*  CREATININE 1.11* 1.16* 1.19* 1.07*  CALCIUM 10.6* 11.1* 11.2* 11.9*  MG 2.3 1.9  --  1.8  PHOS 2.7 2.9  --  3.2  PROT  --   --   --  6.3*  ALBUMIN  --   --   --  1.6*  AST  --   --   --  58*  ALT  --   --   --  48  ALKPHOS  --   --   --  236*  BILITOT  --   --   --  1.3*   Estimated Creatinine Clearance: 45 mL/min (by C-G formula based on Cr of 1.07).    Recent Labs  05/15/15 1959 05/15/15 2359 05/16/15 0352  GLUCAP 145* 130* 141*    Medical History: Past Medical History  Diagnosis Date  . 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 Diabetes mellitus     Type II. Insulin dependent. Diabetic nephropathy.    Insulin Requirements:  Lantus 25units bid, 85units/day in TPN, 9units of SSI last 24hrs (decreased)  Current Nutrition: NPO  IVF: D5W w/ 40KCl at 59m/hr -> SWI w/ 3 amps bicarb at 724mhr.  Central access: PICC per IR TPN start date: 1/9  ASSESSMENT                                                                                                           HPI:  [name] presents with N/V and abdominal pain on 1/1, found to have gallstone pancreatitis.  CT revealing necrotizing pancreatitis. Surgery note form 1/8 states severe abdominal distention and ileus.  Pharmacy asked to start TPN on 1/7 but PICC has not been able to be placed by IV team.  Plans for IR to place PICC. Of note, she has DM on lantus and metformin prior to admission as well as hyperlipidemia on statin therapy.   Significant events:   1/12: Stool occurrence x2  1/13: NG removed.  Continues on D5W.  Not a reasonable fluid alternative at this point with chloride  still elevated.  Had discussion with MD and patient needs continued fluid replacement  1/16: D5W dc'd and Lantus increased to help control CBGs  1/17: TPN at goal. Added D5W w/ KCl at a low rate to deliver K and guard against hypoglycemia.  Today, 05/16/2015:   Glucose - mostly at goal of <150. No hypoglycemia last 24hrs.    Electrolytes - Cl remains elevated. CO2 about the same w/ Bicarb in IVF. K wnl. Corr Ca 13.8. To get calcitonin IM x 2 days.  Phos wnl. (Ca x Phos up to 44. Goal < 55). Mg remains wnl.   Renal - SCr wnl. AKI resolving, UOP remains high. I don't see that she received any IV contrast prior to MRA attempt 1/17.  LFTs - Alk Phos trending up. Albumin about the same.   TGs - wnl  Prealbumin - worse at 5.3  NUTRITIONAL GOALS                                                                                           RD recs(updated 1/16): Kcal: 1600-1800 Protein: 75-85 grams Fluid: 1.8-2 L/day  Clinimix 5/15 at a goal rate of 70 ml/hr + 20% fat emulsion at 10 ml/hr to provide: 84 g/day protein, 1672 Kcal/day.  PLAN                                                                                                                         At 1800 today:  Cont Clinimix 5/15 at 70 ml/hr which is now the goal rate.   Keep electrolytes removed due to hypercalcemia. Getting  calcitonin.  Cont insulin at 85 units/24 hr   TPN to contain standard multivitamins and trace elements.  Cont 20% fat emulsion at 10 ml/hr.  Reduce Lantus to 20 units q12h since dextrose removed from IVF.  Cont bicarb at 75m/hr but add KCl to help prevent hypokalemia. (No other source of K with electrolyte-free TPN.)  Continue resistant SSI q4h.  TPN lab panels on Mondays & Thursdays.  TRomeo Rabon PharmD, pager [phone] 05/16/2015,8:32 AM.

## 2015-05-16 NOTE — Progress Notes (Signed)
Nutrition Follow-up  INTERVENTION:   TPN per Pharmacy RD to continue to monitor  NUTRITION DIAGNOSIS:   Inadequate oral intake related to inability to eat as evidenced by NPO status.  Ongoing.  GOAL:   Patient will meet greater than or equal to 90% of their needs  Meeting.  MONITOR:   PO intake, Diet advancement, Labs, Weight trends, Skin, I & O's, Other (Comment) (TPN)  REASON FOR ASSESSMENT:   Consult New TPN/TNA  ASSESSMENT:   Victoria Morrow is a 65 y.o. female with a past medical history of type 2 diabetes on insulin, chronic kidney disease of unknown stage, hyperlipidemia, who was in her usual state of health till earlier today when she started developing nausea followed by vomiting. She also started having upper abdominal pain. Symptoms got worse and she decided to come into the hospital for further evaluation. She denies having had similar symptoms in the past. Pain is located in the upper abdomen without any radiation. It's achy pain 10 out of 10 in intensity. No aggravating or relieving factors. No known precipitant factors. Denies any recent antibiotic use. No sick contacts. No recent hospitalization. Denies any fever but has had chills. Denies any blood in the emesis or in the stool.  Pt continues bowel rest and TPN at goal. CBGs continue to be within goal.  Plan per Pharmacy: At 1800 today:  Cont Clinimix 5/15 at 70 ml/hr which is now the goal rate.   Keep electrolytes removed due to hypercalcemia. Getting calcitonin.  Cont insulin at 85 units/24 hr  Labs reviewed: CBGs: 130-154 Elevated BUN, Creatinine Mg/Phos WNL  Diet Order:  Diet NPO time specified Except for: Ice Chips TPN (CLINIMIX) Adult without lytes TPN (CLINIMIX) Adult without lytes  Skin:  Reviewed, no issues  Last BM:  1/16  Height:   Ht Readings from Last 1 Encounters:  05/01/15  (1.575 m)    Weight:   Wt Readings from Last 1 Encounters:  05/15/15 137 lb 12.6 oz (62.5 kg)     Ideal Body Weight:  50 kg  BMI:  Body mass index is 25.2 kg/(m^2).  Estimated Nutritional Needs:   Kcal:  1600-1800  Protein:  75-85g  Fluid:  1.8 - 2L/day  EDUCATION NEEDS:   No education needs identified at this time  Tilda Franco, MS, RD, LDN Pager: [phone] After Hours Pager: [phone]

## 2015-05-16 NOTE — Progress Notes (Signed)
Pharmacy Antibiotic Follow-up Note  Victoria Morrow is a 65 y.o. year-old female admitted on 04/28/2015.  The patient is currently on day 12 of meropenem (Day 16 of active abx) for necrotizing pancreatitis.  Assessment/Plan: Temp: still mild fevers. WBC: elevated but improving Renal: SCr improved/normalized, CrCl 43ml/min   This patient's current antibiotics will be continued without adjustments.   Diflucan finishes 1/20.   Day 15 Meropenem 1g q12h. Klebsiella UTI adequately treated. Continuing for pancreatitis/intra-abd infection.  F/u peritoneal fluid cultures.   Temp (24hrs), Avg:99.5 F (37.5 C), Min:98.4 F (36.9 C), Max:101.5 F (38.6 C)   Recent Labs Lab 05/11/15 0500 05/13/15 0530 05/14/15 0555 05/15/15 0549 05/16/15 0515  WBC 20.9* 22.0* 21.0* 19.5* 17.0*     Recent Labs Lab 05/13/15 0530 05/14/15 0555 05/15/15 0549 05/15/15 1831 05/16/15 0515  CREATININE 1.35* 1.11* 1.16* 1.19* 1.07*   Estimated Creatinine Clearance: 45 mL/min (by C-G formula based on Cr of 1.07).    No Known Allergies  Antimicrobials this admission: 1/1 >> Primaxin  >> 1/3 1/3 >> Rocephin >> 1/4 1/3 >> Flagyl >> 1/5 1/4 >> Primaxin >> 1/5 1/5 >> meropenem >>  1/6 >> PO vanc >> 1/7 1/15 >> Fluconazole (MD) >> (1/20) For yeast in urine - not reported?  Microbiology Results: 1/1 BCx: NGF 1/1 MRSA PCR: neg 1/2 urine: >100k Klebsiella pneumo (R-amp) 1/5 blood: 1/2 NGF, other canceled 1/5 trach asp: NGF 1/6 C diff: neg 1/14 blood: ngtd 1/14 peritoneal fluid: ngtd 1/15 peritoneal fluid: ngtd  Thank you for allowing pharmacy to be a part of this patient's care.  Charolotte Eke, PharmD, pager [phone]. 05/16/2015,9:50 AM.

## 2015-05-16 NOTE — Care Management Note (Signed)
Case Management Note  Patient Details  Name: Victoria Morrow MRN: [id number] Date of Birth: 11/28/1950  Subjective/Objective:          Date: May 16, 2015 Chart reviewed for concurrent status and case management needs. Will continue to follow patient for changes and needs: Marcelle Smiling, RN, BSN, [place]   [phone]          Action/Plan:   Expected Discharge Date:                  Expected Discharge Plan:  Home/Self Care  In-House Referral:  NA  Discharge planning Services  CM Consult  Post Acute Care Choice:  NA Choice offered to:  NA  DME Arranged:    DME Agency:     HH Arranged:    HH Agency:     Status of Service:  In process, will continue to follow  Medicare Important Message Given:    Date Medicare IM Given:    Medicare IM give by:    Date Additional Medicare IM Given:    Additional Medicare Important Message give by:     If discussed at Long Length of Stay Meetings, dates discussed:  [id number]  Additional Comments:  Golda Acre, RN 05/16/2015, 10:25 AM

## 2015-05-16 NOTE — Progress Notes (Signed)
[facility] PROGRESS NOTE  Victoria Morrow:[id number] DOB: [birth year] DOA: 04/28/2015 PCP: Maryelizabeth Rowan, MD  HPI/Brief narrative 65 year old African-American female with a past medical history of type 2 diabetes on insulin, chronic kidney disease of unknown stage, presented with abdominal pain, nausea and vomiting. She was found to have severe pancreatitis thought to be biliary in origin. She was hospitalized for further management. Critical care medicine was consulted. 1/4 vomited, aspirated, intubated, shock  Required neo since 1/7, transferred care under critical care 1/5 extubated 1/7 SVT requiring cardizem gtt 1/8 Vomiting - NG to LIS 1/12 off cardizem gtt 1/14 care transferred to [facility], with critical care and general surgery following   Assessment/Plan:  necrotizing pancreatitis -CT scan with evidence of gallstones. Patient denies any history of alcohol intake. Ultrasound of the abdomen demonstrates GB thickening with pericholecystic fluid and 5mm stone lodged in neck of gallbladder and no Murphy's sign. CBD is normal size.  -General surgery is following. No clear evidence for acute cholecystitis.  -T continue on abx, tpn, diflucan added on 1/14, bowel rest. -Patient not able to tolerate MRI of abdomen due to not able to hold breath for longer duration. -- Plan to perform -CT abdomen to further evaluate for infection, abscess.  -WBC trending down.   Fever:  Continue to have mild fevers.  Repeated culture pending, ua +yeast,  started diflucan,   ct chest/ab/pel on 1/14:  pancreatic necrosis with poor definition of the head and body the pancreas with increased low-attenuation edema compared to prior exam. 2. Increased fluid along the pericolic gutters. The are no clear organized pseudocyst at this point. The RIGHT pericolic gutter collection is the most organized.  IR consulted  for fluids collection drain and culture. US guided para was done on 1/15, culture pending. No  growth to date,   Metabolic acidosis.; Increase IV fluids and bicarb Gtt at 75 cc.   Hypercalcemia;  Unclear etiology, might be related to pancreatitis.  Will order PTH, Vitamin D level.  Increase IV fluids.  Will order calcitonin. Repeat labs tonight to evaluate for another dose of calcitonin.   Anemia: likely anemia of chronic disease, patient remain sinus tachycardia, tachypenic, transfuse 1prbc on 1/15 Hb stable at 8.5.   Hypomagnesemia:at 1.8. Repeat in am.   Septic shock: required intubation and pressors. Now off pressors, extubated. Remain sinus tachcyaardia, tachypneic, febrile, leukocytosis.  VDRF; intubated on 1/14 and extubated on 1/15  PAF/SVT:  required cardizem drip, now on lopressor.  Has been in sinus tachycardia since transfer to [facility] lvef 55%-60%, dynamic obstruction reported, grade 1 diastolic dysfunction reported.  increase betablocker iv lopressor from q8hrs to q6hr on 1/17, continue monitor bp and heart rate.  UTI: kleb pneu finished abx treatment  Urinary retention: urology consulted initially during hospitalization due to difficult foley placed placement. She is found to have grade 2 cystocele and urethral stenosis.  ARF on CKD III; better, cr now back to baseline, cr 2.8 on admission, nadir 1.18 on 1/14, today 1,0. Continue with IV fluids.  Good urine out put at 1.8 L/   Transaminitis LFTs are slightly improved. Probably related to her necrotizing pancreatitis/sepsis/tpn. Ultrasound of the right upper quadrant as above. Hepatitis panel is negative.  Continue to trend LFTs.  Insulin dependent Diabetes mellitus type 2, poorly controlled, with chronic kidney disease . HbA1c is 7.7.  Continue insulin, adjust prn  insulin, TPN Decreased  lantus to 25 units BID 1-18, CBG in the 80. Also getting insulin with TPN.  Adjust  insulin as needed. Appreciate Pharmacy assistance.   Code Status: Full Family Communication: Patient Disposition Plan:  remain in  stepdown treatment for necrotizing pancreatitis.    Consultants:  TRH-1/4PCCM -1/14TRH  General Surgery  Urology  Procedures:  Intubation /extubation/ng  Central line  tpn  US guided para on 1/15   Antibiotics: Anti-infectives    Start     Dose/Rate Route Frequency Ordered Stop   05/13/15 1000  fluconazole (DIFLUCAN) IVPB 100 mg     100 mg 50 mL/hr over 60 Minutes Intravenous Every 24 hours 05/12/15 0813 05/17/15 0959   05/12/15 1000  fluconazole (DIFLUCAN) IVPB 200 mg     200 mg 100 mL/hr over 60 Minutes Intravenous  Once 05/12/15 0813 05/12/15 1029   05/08/15 2200  meropenem (MERREM) 1 g in sodium chloride 0.9 % 100 mL IVPB     1 g 200 mL/hr over 30 Minutes Intravenous Every 12 hours 05/08/15 1054     05/03/15 1200  vancomycin (VANCOCIN) 50 mg/mL oral solution 500 mg  Status:  Discontinued     500 mg Oral 4 times per day 05/03/15 0917 05/04/15 1306   05/02/15 1200  cefTRIAXone (ROCEPHIN) 1 g in dextrose 5 % 50 mL IVPB  Status:  Discontinued     1 g 100 mL/hr over 30 Minutes Intravenous Every 24 hours 05/01/15 1448 05/01/15 2004   05/02/15 0600  imipenem-cilastatin (PRIMAXIN) 250 mg in sodium chloride 0.9 % 100 mL IVPB  Status:  Discontinued     250 mg 200 mL/hr over 30 Minutes Intravenous 3 times per day 05/01/15 2025 05/02/15 0027   05/02/15 0030  meropenem (MERREM) 500 mg in sodium chloride 0.9 % 50 mL IVPB  Status:  Discontinued     500 mg 100 mL/hr over 30 Minutes Intravenous Every 12 hours 05/02/15 0027 05/08/15 1054   05/01/15 2030  imipenem-cilastatin (PRIMAXIN) 250 mg in sodium chloride 0.9 % 100 mL IVPB     250 mg 200 mL/hr over 30 Minutes Intravenous NOW 05/01/15 2025 05/01/15 2110   04/30/15 1100  metroNIDAZOLE (FLAGYL) IVPB 500 mg  Status:  Discontinued     500 mg 100 mL/hr over 60 Minutes Intravenous Every 12 hours 04/30/15 0951 05/02/15 0751   04/30/15 1000  cefTRIAXone (ROCEPHIN) 1 g in dextrose 5 % 50 mL IVPB  Status:  Discontinued     1 g 100  mL/hr over 30 Minutes Intravenous Every 24 hours 04/30/15 0939 05/01/15 1205   04/30/15 0945  cefTRIAXone (ROCEPHIN) injection 1 g  Status:  Discontinued     1 g Intramuscular Every 24 hours 04/30/15 0930 04/30/15 0938   04/29/15 0200  imipenem-cilastatin (PRIMAXIN) 250 mg in sodium chloride 0.9 % 100 mL IVPB  Status:  Discontinued     250 mg 200 mL/hr over 30 Minutes Intravenous Every 8 hours 04/28/15 1812 04/30/15 0930   04/28/15 1830  imipenem-cilastatin (PRIMAXIN) 250 mg in sodium chloride 0.9 % 100 mL IVPB     250 mg 200 mL/hr over 30 Minutes Intravenous  Once 04/28/15 1812 04/28/15 1904      HPI/Subjective: Patient alert, with tachypnea, report breathing ok at times.  Denies abdominal pain. Feels abdomen has decrease in size. Per nurse report patient getting more interactive.   Objective: Filed Vitals:   05/16/15 0600 05/16/15 0700 05/16/15 0800 05/16/15 0810  BP: 105/55  148/72   Pulse: 82 90 98   Temp:    99.4 F (37.4 C)  TempSrc:    Axillary  Resp: Height:      Weight:      SpO2: 100% 100% 100%     Intake/Output Summary (Last 24 hours) at 05/16/15 0905 Last data filed at 05/16/15 0600  Gross per 24 hour  Intake 2741.17 ml  Output   1850 ml  Net 891.17 ml   Filed Weights   05/09/15 0553 05/12/15 0923 05/15/15 0500  Weight: 63.2 kg (139 lb 5.3 oz) 62.5 kg (137 lb 12.6 oz) 62.5 kg (137 lb 12.6 oz)    Exam:   General:  Very frail, With tachypnea.   Cardiovascular: tachycardic, s1, s2  Respiratory: decreased at basis, no wheezing, no rales, no rhonchi  Abdomen: obese, decreased BS, non tender  Musculoskeletal: perfused, no clubbing   Data Reviewed: Basic Metabolic Panel:  Recent Labs Lab 05/10/15 0412  05/13/15 0530 05/14/15 0555 05/15/15 0549 05/15/15 1831 05/16/15 0515  NA 149*  < > 139 143 142 139 143  K 4.5  < > 3.8 3.6 3.5 4.2 3.7  CL 123*  < > 115* 118* 118* 116* 119*  CO2 18*  < > 15* 14* 15* 16* 15*  GLUCOSE 235*  < > 173*  105* 85 443* 169*  BUN 29*  < > 34* 34* 35* 36* 37*  CREATININE 1.28*  < > 1.35* 1.11* 1.16* 1.19* 1.07*  CALCIUM 10.3  < > 10.1 10.6* 11.1* 11.2* 11.9*  MG 1.7  --  1.3* 2.3 1.9  --  1.8  PHOS 3.3  --  3.0 2.7 2.9  --  3.2  < > = values in this interval not displayed. Liver Function Tests:  Recent Labs Lab 05/13/15 0530 05/16/15 0515  AST 56* 58*  ALT 39 48  ALKPHOS 206* 236*  BILITOT 0.8 1.3*  PROT 5.4* 6.3*  ALBUMIN 1.4* 1.6*    Recent Labs Lab 05/12/15 0422  LIPASE 44   No results for input(s): AMMONIA in the last 168 hours. CBC:  Recent Labs Lab 05/11/15 0500 05/13/15 0530 05/14/15 0555 05/15/15 0549 05/16/15 0515  WBC 20.9* 22.0* 21.0* 19.5* 17.0*  NEUTROABS  --  18.1*  --   --   --   HGB 8.1* 7.1* 9.1* 8.5* 8.4*  HCT 24.9* 21.0* 26.6* 25.5* 25.5*  MCV 85.6 82.4 83.4 84.4 85.6  PLT 558* 504* 538* 586* 558*   Cardiac Enzymes: No results for input(s): CKTOTAL, CKMB, CKMBINDEX, TROPONINI in the last 168 hours. BNP (last 3 results) No results for input(s): BNP in the last 8760 hours.  ProBNP (last 3 results) No results for input(s): PROBNP in the last 8760 hours.  CBG:  Recent Labs Lab 05/15/15 1155 05/15/15 1630 05/15/15 1959 05/15/15 2359 05/16/15 0352  GLUCAP 113* 119* 145* 130* 141*    Recent Results (from the past 240 hour(s))  Culture, blood (routine x 2)     Status: None (Preliminary result)   Collection Time: 05/11/15 10:43 AM  Result Value Ref Range Status   Specimen Description BLOOD BLOOD LEFT HAND  Final   Special Requests BOTTLES DRAWN AEROBIC ONLY 6CC  Final   Culture   Final    NO GROWTH 4 DAYS Performed at [facility]    Report Status PENDING  Incomplete  Culture, blood (routine x 2)     Status: None (Preliminary result)   Collection Time: 05/11/15 10:50 AM  Result Value Ref Range Status   Specimen Description BLOOD BLOOD LEFT HAND  Final   Special Requests IN PEDIATRIC  BOTTLE 3CC  Final   Culture   Final    NO  GROWTH 4 DAYS Performed at [facility]    Report Status PENDING  Incomplete  Culture, body fluid-bottle     Status: None (Preliminary result)   Collection Time: 05/12/15 12:43 PM  Result Value Ref Range Status   Specimen Description FLUID PERITONEAL  Final   Special Requests NONE  Final   Culture   Final    NO GROWTH 3 DAYS Performed at [facility]    Report Status PENDING  Incomplete  Gram stain     Status: None   Collection Time: 05/12/15 12:43 PM  Result Value Ref Range Status   Specimen Description FLUID PERITONEAL  Final   Special Requests NONE  Final   Gram Stain   Final    FEW WBC PRESENT, PREDOMINANTLY MONONUCLEAR NO ORGANISMS SEEN Performed at [facility]    Report Status 05/12/2015 FINAL  Final     Studies: No results found.  Scheduled Meds: . antiseptic oral rinse  7 mL Mouth Rinse q12n4p  . aspirin  81 mg Oral Daily  . calcitonin  100 Units Intramuscular Daily  . chlorhexidine  15 mL Mouth Rinse BID  . famotidine (PEPCID) IV  20 mg Intravenous Q24H  . fluconazole (DIFLUCAN) IV  100 mg Intravenous Q24H  . heparin  5,000 Units Subcutaneous 3 times per day  . insulin aspart  0-20 Units Subcutaneous 6 times per day  . insulin glargine  25 Units Subcutaneous BID  . meropenem (MERREM) IV  1 g Intravenous Q12H  . metoprolol  5 mg Intravenous 4 times per day  . sodium chloride  10-40 mL Intracatheter Q12H  . sodium chloride  3 mL Intravenous Q12H   Continuous Infusions: . TPN (CLINIMIX) Adult without lytes 70 mL/hr at 05/15/15 1745   And  . fat emulsion 240 mL (05/15/15 1746)  .  sodium bicarbonate 150 mEq in sterile water 1000 mL infusion      Principal Problem:   Acute necrotizing pancreatitis with SIRS Active Problems:   Gallstones   Transaminitis   DM type 2 (diabetes mellitus, type 2) (HCC)   Hyperlipidemia   ARF (acute renal failure) (HCC)   Acute respiratory failure with hypoxia (HCC)   Aspiration pneumonia due to vomit  (HCC)   Acute on chronic respiratory failure (HCC)   Protein-calorie malnutrition, moderate (HCC)   Umbilical hernia   Chronic kidney disease (CKD), stage III (moderate)   Olivopontocerebellar atrophy (HCC)   Abscess   Fever   Necrotizing pancreatitis     Hartley Barefoot A MD  [facility] Pager [phone] If 7PM-7AM, please contact night-coverage at [email or web address], password [facility] 05/16/2015, 9:05 AM  LOS: 18 days

## 2015-05-16 NOTE — Progress Notes (Signed)
Patient ID: Victoria Morrow, female   DOB: 1950/10/29, 66 y.o.   MRN: [id number]     [address] [place], [place] [address]    Phone: [phone] FAX: [phone]     Subjective: Denies pain. Febrile.  WBC continues to trend down slowly. Good UOP and sCr down 1.07. Remains tachypnic, less labored.   Objective:  Vital signs:  Filed Vitals:   05/16/15 0400 05/16/15 0500 05/16/15 0600 05/16/15 0810  BP: 145/62  105/55   Pulse: 99 108 82   Temp: 99.4 F (37.4 C)   99.4 F (37.4 C)  TempSrc: Oral   Axillary  Resp: _0 Height:      Weight:      SpO2: 99% 99% 100%     Last BM Date: 05/13/15  Intake/Output   Yesterday:  01/18 0701 - 01/19 0700 In: 2741.2 [I.V.:600; IV Piggyback:300; UDJ:4970.2] Out: 1850 [Urine:1850] This shift:   Physical Exam: General: Pt awake/alert/oriented x4 in no acute distress  Abdomen: Soft. distended. Non tender. No evidence of peritonitis. No incarcerated hernias.   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HCC)   Abscess   Fever   Necrotizing pancreatitis    Results:   Labs: Results for orders placed or performed during the hospital encounter of 04/28/15 (from the past 48 hour(s))  Glucose, capillary     Status: Abnormal   Collection Time: 05/14/15 11:23 AM  Result Value Ref Range   Glucose-Capillary 126 (H) 65 - 99 mg/dL   Comment 1 Notify RN   Glucose, capillary     Status: Abnormal   Collection Time: 05/14/15  3:50 PM  Result Value Ref Range   Glucose-Capillary 144 (H) 65 - 99 mg/dL   Comment 1 Notify RN   Glucose, capillary     Status: Abnormal   Collection Time: 05/14/15  8:04 PM  Result Value Ref Range   Glucose-Capillary 147 (H) 65 - 99 mg/dL   Comment 1 Notify RN   Glucose, capillary     Status: Abnormal   Collection Time: 05/14/15 11:55 PM  Result Value Ref Range   Glucose-Capillary 122 (H) 65 - 99 mg/dL   Comment 1 Notify RN   Glucose, capillary     Status: Abnormal   Collection Time: 05/15/15  3:56 AM  Result Value Ref Range   Glucose-Capillary 103 (H) 65 - 99 mg/dL  CBC     Status: Abnormal   Collection Time: 05/15/15  5:49 AM  Result Value Ref Range   WBC 19.5 (H) 4.0 - 10.5 K/uL   RBC 3.02 (L) 3.87 - 5.11 MIL/uL   Hemoglobin 8.5 (L) 12.0 - 15.0 g/dL   HCT 25.5 (L) 36.0 - 46.0 %   MCV 84.4 78.0 - 100.0 fL   MCH 28.1 26.0 - 34.0 pg   MCHC 33.3 30.0 - 36.0 g/dL   RDW 13.5 11.5 - 15.5 %   Platelets 586 (H) 150 -  400 K/uL  Basic metabolic panel     Status: Abnormal   Collection Time: 05/15/15  5:49 AM  Result Value Ref Range   Sodium 142 135 - 145 mmol/L   Potassium 3.5 3.5 - 5.1 mmol/L   Chloride 118 (H) 101 - 111 mmol/L   CO2 15 (L) 22 - 32 mmol/L   Glucose, Bld 85 65 - 99 mg/dL   BUN 35 (H) 6 - 20 mg/dL   Creatinine, Ser 1.16 (H) 0.44 - 1.00 mg/dL   Calcium 11.1 (H) 8.9 - 10.3 mg/dL   GFR calc non Af Amer 48 (L) >60 mL/min   GFR calc Af Amer 56 (L) >60 mL/min    Comment: (NOTE) The eGFR has been calculated using the CKD EPI equation. This calculation has not been validated in all clinical situations. eGFR's persistently <60 mL/min signify possible Chronic Kidney Disease.    Anion gap 9 5 - 15  Magnesium     Status: None   Collection Time: 05/15/15  5:49 AM  Result Value Ref Range   Magnesium 1.9 1.7 - 2.4 mg/dL  Phosphorus     Status: None   Collection Time: 05/15/15  5:49 AM  Result Value Ref Range   Phosphorus 2.9 2.5 - 4.6  mg/dL  Glucose, capillary     Status: Abnormal   Collection Time: 05/15/15  7:55 AM  Result Value Ref Range   Glucose-Capillary 110 (H) 65 - 99 mg/dL   Comment 1 Notify RN    Comment 2 Document in Chart   Glucose, capillary     Status: Abnormal   Collection Time: 05/15/15 11:55 AM  Result Value Ref Range   Glucose-Capillary 113 (H) 65 - 99 mg/dL  Glucose, capillary     Status: Abnormal   Collection Time: 05/15/15  4:30 PM  Result Value Ref Range   Glucose-Capillary 119 (H) 65 - 99 mg/dL  Basic metabolic panel     Status: Abnormal   Collection Time: 05/15/15  6:31 PM  Result Value Ref Range   Sodium 139 135 - 145 mmol/L   Potassium 4.2 3.5 - 5.1 mmol/L   Chloride 116 (H) 101 - 111 mmol/L   CO2 16 (L) 22 - 32 mmol/L   Glucose, Bld 443 (H) 65 - 99 mg/dL   BUN 36 (H) 6 - 20 mg/dL   Creatinine, Ser 1.19 (H) 0.44 - 1.00 mg/dL   Calcium 11.2 (H) 8.9 - 10.3 mg/dL   GFR calc non Af Amer 47 (L) >60 mL/min   GFR calc Af Amer 54 (L) >60 mL/min    Comment: (NOTE) The eGFR has been calculated using the CKD EPI equation. This calculation has not been validated in all clinical situations. eGFR's persistently <60 mL/min signify possible Chronic Kidney Disease.    Anion gap 7 5 - 15  Glucose, capillary     Status: Abnormal   Collection Time: 05/15/15  7:59 PM  Result Value Ref Range   Glucose-Capillary 145 (H) 65 - 99 mg/dL   Comment 1 Notify RN   Glucose, capillary     Status: Abnormal   Collection Time: 05/15/15 11:59 PM  Result Value Ref Range   Glucose-Capillary 130 (H) 65 - 99 mg/dL  Glucose, capillary     Status: Abnormal   Collection Time: 05/16/15  3:52 AM  Result Value Ref Range   Glucose-Capillary 141 (H) 65 - 99 mg/dL   Comment 1 Notify RN   Comprehensive metabolic panel     Status:  Abnormal   Collection Time: 05/16/15  5:15 AM  Result Value Ref Range   Sodium 143 135 - 145 mmol/L   Potassium 3.7 3.5 - 5.1 mmol/L   Chloride 119 (H) 101 - 111 mmol/L   CO2 15 (L) 22 - 32  mmol/L   Glucose, Bld 169 (H) 65 - 99 mg/dL   BUN 37 (H) 6 - 20 mg/dL   Creatinine, Ser 1.07 (H) 0.44 - 1.00 mg/dL   Calcium 11.9 (H) 8.9 - 10.3 mg/dL   Total Protein 6.3 (L) 6.5 - 8.1 g/dL   Albumin 1.6 (L) 3.5 - 5.0 g/dL   AST 58 (H) 15 - 41 U/L   ALT 48 14 - 54 U/L   Alkaline Phosphatase 236 (H) 38 - 126 U/L   Total Bilirubin 1.3 (H) 0.3 - 1.2 mg/dL   GFR calc non Af Amer 53 (L) >60 mL/min   GFR calc Af Amer >60 >60 mL/min    Comment: (NOTE) The eGFR has been calculated using the CKD EPI equation. This calculation has not been validated in all clinical situations. eGFR's persistently <60 mL/min signify possible Chronic Kidney Disease.    Anion gap 9 5 - 15  Magnesium     Status: None   Collection Time: 05/16/15  5:15 AM  Result Value Ref Range   Magnesium 1.8 1.7 - 2.4 mg/dL  Phosphorus     Status: None   Collection Time: 05/16/15  5:15 AM  Result Value Ref Range   Phosphorus 3.2 2.5 - 4.6 mg/dL  CBC     Status: Abnormal   Collection Time: 05/16/15  5:15 AM  Result Value Ref Range   WBC 17.0 (H) 4.0 - 10.5 K/uL   RBC 2.98 (L) 3.87 - 5.11 MIL/uL   Hemoglobin 8.4 (L) 12.0 - 15.0 g/dL   HCT 25.5 (L) 36.0 - 46.0 %   MCV 85.6 78.0 - 100.0 fL   MCH 28.2 26.0 - 34.0 pg   MCHC 32.9 30.0 - 36.0 g/dL   RDW 13.8 11.5 - 15.5 %   Platelets 558 (H) 150 - 400 K/uL    Imaging / Studies: No results found.  Medications / Allergies:  Scheduled Meds: . antiseptic oral rinse  7 mL Mouth Rinse q12n4p  . aspirin  81 mg Oral Daily  . chlorhexidine  15 mL Mouth Rinse BID  . famotidine (PEPCID) IV  20 mg Intravenous Q24H  . fluconazole (DIFLUCAN) IV  100 mg Intravenous Q24H  . heparin  5,000 Units Subcutaneous 3 times per day  . insulin aspart  0-20 Units Subcutaneous 6 times per day  . insulin glargine  25 Units Subcutaneous BID  . meropenem (MERREM) IV  1 g Intravenous Q12H  . metoprolol  5 mg Intravenous 4 times per day  . sodium chloride  10-40 mL Intracatheter Q12H  . sodium  chloride  3 mL Intravenous Q12H   Continuous Infusions: . dextrose 5 % 1,000 mL with potassium chloride 40 mEq, sodium bicarbonate 100 mEq infusion 30 mL/hr at 05/15/15 1000  . TPN (CLINIMIX) Adult without lytes 70 mL/hr at 05/15/15 1745   And  . fat emulsion 240 mL (05/15/15 1746)   PRN Meds:.acetaminophen **OR** acetaminophen, bismuth subsalicylate, diphenhydrAMINE, fentaNYL (SUBLIMAZE) injection, hydrALAZINE, levalbuterol, metoprolol, ondansetron (ZOFRAN) IV **OR** ondansetron (ZOFRAN) IV, promethazine, sodium chloride  Antibiotics: Anti-infectives    Start     Dose/Rate Route Frequency Ordered Stop   05/13/15 1000  fluconazole (DIFLUCAN) IVPB 100 mg  100 mg 50 mL/hr over 60 Minutes Intravenous Every 24 hours 05/12/15 0813 05/17/15 0959   05/12/15 1000  fluconazole (DIFLUCAN) IVPB 200 mg     200 mg 100 mL/hr over 60 Minutes Intravenous  Once 05/12/15 0813 05/12/15 1029   05/08/15 2200  meropenem (MERREM) 1 g in sodium chloride 0.9 % 100 mL IVPB     1 g 200 mL/hr over 30 Minutes Intravenous Every 12 hours 05/08/15 1054     05/03/15 1200  vancomycin (VANCOCIN) 50 mg/mL oral solution 500 mg  Status:  Discontinued     500 mg Oral 4 times per day 05/03/15 0917 05/04/15 1306   05/02/15 1200  cefTRIAXone (ROCEPHIN) 1 g in dextrose 5 % 50 mL IVPB  Status:  Discontinued     1 g 100 mL/hr over 30 Minutes Intravenous Every 24 hours 05/01/15 1448 05/01/15 2004   05/02/15 0600  imipenem-cilastatin (PRIMAXIN) 250 mg in sodium chloride 0.9 % 100 mL IVPB  Status:  Discontinued     250 mg 200 mL/hr over 30 Minutes Intravenous 3 times per day 05/01/15 2025 05/02/15 0027   05/02/15 0030  meropenem (MERREM) 500 mg in sodium chloride 0.9 % 50 mL IVPB  Status:  Discontinued     500 mg 100 mL/hr over 30 Minutes Intravenous Every 12 hours 05/02/15 0027 05/08/15 1054   05/01/15 2030  imipenem-cilastatin (PRIMAXIN) 250 mg in sodium chloride 0.9 % 100 mL IVPB     250 mg 200 mL/hr over 30 Minutes  Intravenous NOW 05/01/15 2025 05/01/15 2110   04/30/15 1100  metroNIDAZOLE (FLAGYL) IVPB 500 mg  Status:  Discontinued     500 mg 100 mL/hr over 60 Minutes Intravenous Every 12 hours 04/30/15 0951 05/02/15 0751   04/30/15 1000  cefTRIAXone (ROCEPHIN) 1 g in dextrose 5 % 50 mL IVPB  Status:  Discontinued     1 g 100 mL/hr over 30 Minutes Intravenous Every 24 hours 04/30/15 0939 05/01/15 1205   04/30/15 0945  cefTRIAXone (ROCEPHIN) injection 1 g  Status:  Discontinued     1 g Intramuscular Every 24 hours 04/30/15 0930 04/30/15 0938   04/29/15 0200  imipenem-cilastatin (PRIMAXIN) 250 mg in sodium chloride 0.9 % 100 mL IVPB  Status:  Discontinued     250 mg 200 mL/hr over 30 Minutes Intravenous Every 8 hours 04/28/15 1812 04/30/15 0930   04/28/15 1830  imipenem-cilastatin (PRIMAXIN) 250 mg in sodium chloride 0.9 % 100 mL IVPB     250 mg 200 mL/hr over 30 Minutes Intravenous  Once 04/28/15 1812 04/28/15 1904         Assessment/Plan Acute necrotizing pancreatitis with SIRS Gallstones -CT today with pancreatic protolol -continue atbx, bowel rest, TPN.    Erby Pian, [facility] Surgery Pager [phone]([id number])   05/16/2015 8:17 AM

## 2015-05-17 ENCOUNTER — Inpatient Hospital Stay (HOSPITAL_COMMUNITY): Payer: Commercial Managed Care - HMO

## 2015-05-17 LAB — CULTURE, BODY FLUID-BOTTLE: CULTURE: NO GROWTH

## 2015-05-17 LAB — CBC
HEMATOCRIT: 23.9 % — AB (ref 36.0–46.0)
Hemoglobin: 7.9 g/dL — ABNORMAL LOW (ref 12.0–15.0)
MCH: 28.2 pg (ref 26.0–34.0)
MCHC: 33.1 g/dL (ref 30.0–36.0)
MCV: 85.4 fL (ref 78.0–100.0)
Platelets: 596 10*3/uL — ABNORMAL HIGH (ref 150–400)
RBC: 2.8 MIL/uL — AB (ref 3.87–5.11)
RDW: 14 % (ref 11.5–15.5)
WBC: 17.2 10*3/uL — ABNORMAL HIGH (ref 4.0–10.5)

## 2015-05-17 LAB — BASIC METABOLIC PANEL
Anion gap: 10 (ref 5–15)
BUN: 34 mg/dL — AB (ref 6–20)
CALCIUM: 10.5 mg/dL — AB (ref 8.9–10.3)
CO2: 19 mmol/L — AB (ref 22–32)
CREATININE: 0.93 mg/dL (ref 0.44–1.00)
Chloride: 114 mmol/L — ABNORMAL HIGH (ref 101–111)
GFR calc Af Amer: >60 mL/min (ref >60)
GFR calc non Af Amer: >60 mL/min (ref >60)
GLUCOSE: 156 mg/dL — AB (ref 65–99)
Potassium: 3.6 mmol/L (ref 3.5–5.1)
Sodium: 143 mmol/L (ref 135–145)

## 2015-05-17 LAB — MAGNESIUM: Magnesium: 1.7 mg/dL (ref 1.7–2.4)

## 2015-05-17 LAB — GLUCOSE, CAPILLARY
GLUCOSE-CAPILLARY: 120 mg/dL — AB (ref 65–99)
GLUCOSE-CAPILLARY: 152 mg/dL — AB (ref 65–99)
Glucose-Capillary: 138 mg/dL — ABNORMAL HIGH (ref 65–99)
Glucose-Capillary: 142 mg/dL — ABNORMAL HIGH (ref 65–99)
Glucose-Capillary: 149 mg/dL — ABNORMAL HIGH (ref 65–99)

## 2015-05-17 LAB — PTH, INTACT AND CALCIUM
CALCIUM TOTAL (PTH): 10.5 mg/dL — AB (ref 8.7–10.3)
PTH: 58 pg/mL (ref 15–65)

## 2015-05-17 LAB — TOTAL BILIRUBIN, BODY FLUID: Total bilirubin, fluid: <0.2 mg/dL

## 2015-05-17 LAB — VITAMIN D 25 HYDROXY (VIT D DEFICIENCY, FRACTURES): Vit D, 25-Hydroxy: 15.4 ng/mL — ABNORMAL LOW (ref 30.0–100.0)

## 2015-05-17 MED ORDER — FAT EMULSION 20 % IV EMUL
240.0000 mL | INTRAVENOUS | Status: AC
  Administered 2015-05-17: 240 mL via INTRAVENOUS
  Filled 2015-05-17: qty 250

## 2015-05-17 MED ORDER — MAGNESIUM SULFATE IN D5W 10-5 MG/ML-% IV SOLN
1.0000 g | Freq: Once | INTRAVENOUS | Status: AC
  Administered 2015-05-17: 1 g via INTRAVENOUS
  Filled 2015-05-17: qty 100

## 2015-05-17 MED ORDER — TRACE MINERALS CR-CU-MN-SE-ZN 10-1000-500-60 MCG/ML IV SOLN
INTRAVENOUS | Status: AC
Start: 2015-05-17 — End: 2015-05-18
  Administered 2015-05-17: 18:00:00 via INTRAVENOUS
  Filled 2015-05-17: qty 1680

## 2015-05-17 MED ORDER — IOHEXOL 300 MG/ML  SOLN
25.0000 mL | Freq: Once | INTRAMUSCULAR | Status: AC | PRN
  Administered 2015-05-17: 25 mL via ORAL

## 2015-05-17 MED ORDER — VITAL 1.5 CAL PO LIQD
1000.0000 mL | ORAL | Status: DC
  Administered 2015-05-17 – 2015-05-21 (×3): 1000 mL
  Filled 2015-05-17 (×3): qty 1000

## 2015-05-17 MED ORDER — SODIUM CHLORIDE 0.9 % IV SOLN
1.0000 g | Freq: Three times a day (TID) | INTRAVENOUS | Status: DC
  Administered 2015-05-17 – 2015-05-18 (×3): 1 g via INTRAVENOUS
  Filled 2015-05-17 (×3): qty 1

## 2015-05-17 NOTE — Progress Notes (Signed)
Patient ID: Victoria Morrow, female   DOB: 01/07/1951, 65 y.o.   MRN: [id number]     [facility] [place], [place] [address]    Phone: [phone] FAX: [phone]     Subjective: States she feels better, but unable to articulate. Afebrile x24h.  Last pain meds given 1623 yesterday. WBC stable at 17k.  sCr normal.    Objective:  Vital signs:  Filed Vitals:   05/17/15 0300 05/17/15 0400 05/17/15 0500 05/17/15 0600  BP:  130/73  125/71  Pulse: 110 117 123 107  Temp:      TempSrc:      Resp: 23 30 31 26   Height:      Weight:      SpO2: 99% 99% 99% 98%    Last BM Date: 05/13/15  Intake/Output   Yesterday:  01/19 0701 - 01/20 0700 In: 7893 [I.V.:1425; IV Piggyback:250; YBO:1751] Out: 1400 [Urine:1400] This shift:     Physical Exam: General: Pt awake/alert/oriented x4 in no acute distress  Abdomen: Soft. distended. Non tender. No evidence of peritonitis. No incarcerated hernias.  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HCC)   Abscess   Fever   Necrotizing pancreatitis    Results:   Labs: Results for orders placed or performed during the hospital encounter of 04/28/15 (from the past 48 hour(s))  Glucose, capillary     Status: Abnormal   Collection Time: 05/15/15  7:55 AM  Result Value Ref Range   Glucose-Capillary 110 (H) 65 - 99 mg/dL   Comment 1 Notify RN    Comment 2 Document in Chart   Glucose, capillary     Status:  Abnormal   Collection Time: 05/15/15 11:55 AM  Result Value Ref Range   Glucose-Capillary 113 (H) 65 - 99 mg/dL  Glucose, capillary     Status: Abnormal   Collection Time: 05/15/15  4:30 PM  Result Value Ref Range   Glucose-Capillary 119 (H) 65 - 99 mg/dL  Basic metabolic panel     Status: Abnormal   Collection Time: 05/15/15  6:31 PM  Result Value Ref Range   Sodium 139 135 - 145 mmol/L   Potassium 4.2 3.5 - 5.1 mmol/L   Chloride 116 (H) 101 - 111 mmol/L   CO2 16 (L) 22 - 32 mmol/L   Glucose, Bld 443 (H) 65 - 99 mg/dL   BUN 36 (H) 6 - 20 mg/dL   Creatinine, Ser 1.19 (H) 0.44 - 1.00 mg/dL   Calcium 11.2 (H) 8.9 - 10.3 mg/dL   GFR calc non Af Amer 47 (L) >60 mL/min   GFR calc Af Amer 54 (L) >60 mL/min    Comment: (NOTE) The eGFR has been calculated using the CKD EPI equation. This calculation has not been validated in all clinical situations. eGFR's persistently <60 mL/min signify possible Chronic Kidney Disease.    Anion gap 7 5 - 15  Glucose, capillary     Status: Abnormal  Collection Time: 05/15/15  7:59 PM  Result Value Ref Range   Glucose-Capillary 145 (H) 65 - 99 mg/dL   Comment 1 Notify RN   Glucose, capillary     Status: Abnormal   Collection Time: 05/15/15 11:59 PM  Result Value Ref Range   Glucose-Capillary 130 (H) 65 - 99 mg/dL  Glucose, capillary     Status: Abnormal   Collection Time: 05/16/15  3:52 AM  Result Value Ref Range   Glucose-Capillary 141 (H) 65 - 99 mg/dL   Comment 1 Notify RN   Comprehensive metabolic panel     Status: Abnormal   Collection Time: 05/16/15  5:15 AM  Result Value Ref Range   Sodium 143 135 - 145 mmol/L   Potassium 3.7 3.5 - 5.1 mmol/L   Chloride 119 (H) 101 - 111 mmol/L   CO2 15 (L) 22 - 32 mmol/L   Glucose, Bld 169 (H) 65 - 99 mg/dL   BUN 37 (H) 6 - 20 mg/dL   Creatinine, Ser 1.07 (H) 0.44 - 1.00 mg/dL   Calcium 11.9 (H) 8.9 - 10.3 mg/dL   Total Protein 6.3 (L) 6.5 - 8.1 g/dL   Albumin 1.6 (L) 3.5 - 5.0 g/dL   AST 58 (H)  15 - 41 U/L   ALT 48 14 - 54 U/L   Alkaline Phosphatase 236 (H) 38 - 126 U/L   Total Bilirubin 1.3 (H) 0.3 - 1.2 mg/dL   GFR calc non Af Amer 53 (L) >60 mL/min   GFR calc Af Amer >60 >60 mL/min    Comment: (NOTE) The eGFR has been calculated using the CKD EPI equation. This calculation has not been validated in all clinical situations. eGFR's persistently <60 mL/min signify possible Chronic Kidney Disease.    Anion gap 9 5 - 15  Magnesium     Status: None   Collection Time: 05/16/15  5:15 AM  Result Value Ref Range   Magnesium 1.8 1.7 - 2.4 mg/dL  Phosphorus     Status: None   Collection Time: 05/16/15  5:15 AM  Result Value Ref Range   Phosphorus 3.2 2.5 - 4.6 mg/dL  CBC     Status: Abnormal   Collection Time: 05/16/15  5:15 AM  Result Value Ref Range   WBC 17.0 (H) 4.0 - 10.5 K/uL   RBC 2.98 (L) 3.87 - 5.11 MIL/uL   Hemoglobin 8.4 (L) 12.0 - 15.0 g/dL   HCT 25.5 (L) 36.0 - 46.0 %   MCV 85.6 78.0 - 100.0 fL   MCH 28.2 26.0 - 34.0 pg   MCHC 32.9 30.0 - 36.0 g/dL   RDW 13.8 11.5 - 15.5 %   Platelets 558 (H) 150 - 400 K/uL  Glucose, capillary     Status: Abnormal   Collection Time: 05/16/15  8:14 AM  Result Value Ref Range   Glucose-Capillary 154 (H) 65 - 99 mg/dL  PTH, intact and calcium     Status: Abnormal   Collection Time: 05/16/15 10:10 AM  Result Value Ref Range   PTH 58 15 - 65 pg/mL   Calcium, Total (PTH) 10.5 (H) 8.7 - 10.3 mg/dL   PTH Comment     Comment: (NOTE) Interpretation                 Intact PTH    Calcium                                (  pg/mL)      (mg/dL) Normal                          15 - 65     8.6 - 10.2 Primary Hyperparathyroidism         >65          >10.2 Secondary Hyperparathyroidism       >65          <10.2 Non-Parathyroid Hypercalcemia       <65          >10.2 Hypoparathyroidism                  <15          < 8.6 Non-Parathyroid Hypocalcemia    15 - 65          < 8.6 Performed At: [facility] [place], [place]  [id number] Lindon Romp MD [id number]   VITAMIN D 25 Hydroxy (Vit-D Deficiency, Fractures)     Status: Abnormal   Collection Time: 05/16/15 10:10 AM  Result Value Ref Range   Vit D, 25-Hydroxy 15.4 (L) 30.0 - 100.0 ng/mL    Comment: (NOTE) Vitamin D deficiency has been defined by the Institute of Medicine and an Endocrine Society practice guideline as a level of serum 25-OH vitamin D less than 20 ng/mL (1,2). The Endocrine Society went on to further define vitamin D insufficiency as a level between 21 and 29 ng/mL (2). 1. IOM (Institute of Medicine). 2010. Dietary reference   intakes for calcium and D. [place]: The   [employer]. 2. Holick MF, Binkley , Bischoff-Ferrari HA, et al.   Evaluation, treatment, and prevention of vitamin D   deficiency: an Endocrine Society clinical practice   guideline. JCEM. 2011 Jul; 96(7):1911-30. Performed At: [facility] [place], [place] [id number] Lindon Romp MD [id number]   Glucose, capillary     Status: Abnormal   Collection Time: 05/16/15 11:53 AM  Result Value Ref Range   Glucose-Capillary 121 (H) 65 - 99 mg/dL  Glucose, capillary     Status: Abnormal   Collection Time: 05/16/15  4:15 PM  Result Value Ref Range   Glucose-Capillary 163 (H) 65 - 99 mg/dL  Basic metabolic panel     Status: Abnormal   Collection Time: 05/16/15  6:25 PM  Result Value Ref Range   Sodium 140 135 - 145 mmol/L   Potassium 3.9 3.5 - 5.1 mmol/L   Chloride 113 (H) 101 - 111 mmol/L   CO2 16 (L) 22 - 32 mmol/L   Glucose, Bld 431 (H) 65 - 99 mg/dL   BUN 33 (H) 6 - 20 mg/dL   Creatinine, Ser 0.93 0.44 - 1.00 mg/dL   Calcium 10.3 8.9 - 10.3 mg/dL   GFR calc non Af Amer >60 >60 mL/min   GFR calc Af Amer >60 >60 mL/min    Comment: (NOTE) The eGFR has been calculated using the CKD EPI equation. This calculation has not been validated in all clinical situations. eGFR's persistently <60 mL/min signify possible  Chronic Kidney Disease.    Anion gap 11 5 - 15  Glucose, capillary     Status: Abnormal   Collection Time: 05/16/15  9:24 PM  Result Value Ref Range   Glucose-Capillary 130 (H) 65 - 99 mg/dL   Comment 1 Notify RN   Glucose, capillary     Status: Abnormal  Collection Time: 05/17/15 12:09 AM  Result Value Ref Range   Glucose-Capillary 142 (H) 65 - 99 mg/dL   Comment 1 Notify RN   Glucose, capillary     Status: Abnormal   Collection Time: 05/17/15  4:41 AM  Result Value Ref Range   Glucose-Capillary 138 (H) 65 - 99 mg/dL   Comment 1 Notify RN   CBC     Status: Abnormal   Collection Time: 05/17/15  5:00 AM  Result Value Ref Range   WBC 17.2 (H) 4.0 - 10.5 K/uL   RBC 2.80 (L) 3.87 - 5.11 MIL/uL   Hemoglobin 7.9 (L) 12.0 - 15.0 g/dL   HCT 23.9 (L) 36.0 - 46.0 %   MCV 85.4 78.0 - 100.0 fL   MCH 28.2 26.0 - 34.0 pg   MCHC 33.1 30.0 - 36.0 g/dL   RDW 14.0 11.5 - 15.5 %   Platelets 596 (H) 150 - 400 K/uL  Basic metabolic panel     Status: Abnormal   Collection Time: 05/17/15  5:00 AM  Result Value Ref Range   Sodium 143 135 - 145 mmol/L   Potassium 3.6 3.5 - 5.1 mmol/L   Chloride 114 (H) 101 - 111 mmol/L   CO2 19 (L) 22 - 32 mmol/L   Glucose, Bld 156 (H) 65 - 99 mg/dL   BUN 34 (H) 6 - 20 mg/dL   Creatinine, Ser 0.93 0.44 - 1.00 mg/dL   Calcium 10.5 (H) 8.9 - 10.3 mg/dL   GFR calc non Af Amer >60 >60 mL/min   GFR calc Af Amer >60 >60 mL/min    Comment: (NOTE) The eGFR has been calculated using the CKD EPI equation. This calculation has not been validated in all clinical situations. eGFR's persistently <60 mL/min signify possible Chronic Kidney Disease.    Anion gap 10 5 - 15    Imaging / Studies: Ct Abdomen Pelvis W Contrast  05/16/2015  CLINICAL DATA:  Abdominal pain. Followup severe pancreatitis with imaging findings concerning for necrosis. EXAM: CT ABDOMEN AND PELVIS WITH CONTRAST TECHNIQUE: Multidetector CT imaging of the abdomen and pelvis was performed using the  standard protocol following bolus administration of intravenous contrast. CONTRAST:  116m OMNIPAQUE IOHEXOL 300 MG/ML  SOLN COMPARISON:  05/11/2015. FINDINGS: Lower chest: Small bilateral pleural effusions, decreased. Increased bilateral lower lobe atelectasis. Hepatobiliary: Multiple small gallstones in the gallbladder measuring up to 4 mm in maximum diameter each. Pericholecystic fluid. No gallbladder wall thickening. No intrahepatic biliary ductal dilatation. Pancreas: Better defined confluent low density fluid replacing the head, neck, body and proximal tail of the pancreas. This currently measures 5.7 cm in thickness and previously measured 5.4 cm in maximum thickness. Extensive pancreatic, peripancreatic and bilateral pericolic gutter fluid is again demonstrated. The previously demonstrated 7.5 x 6.2 cm right paracolic fluid collection currently measures 8.4 x 6.4 cm on image number 45 were mild peripheral rim enhancement. This communicates with the fluid in the pancreatic bed and extending into the left paracolic gutter, all with mild peripheral rim enhancement. Spleen: Within normal limits in size and appearance. Adrenals/Urinary Tract: Small right kidney with cortical scarring, parenchymal and parapelvic cysts. Small upper pole left renal parapelvic cysts. Foley catheter in the urinary bladder with associated air in the bladder. Diffuse bladder wall thickening. Normal appearing adrenal glands. Stomach/Bowel: No evidence of obstruction, inflammatory process, or abnormal fluid collections. Vascular/Lymphatic: No enlarged lymph nodes. Atheromatous arterial calcifications. Reproductive: Concentric, uniform endometrial thickening and enhancement with fluid in the endometrial cavity. No adnexal  masses. Other: Very small umbilical hernia containing fat. Musculoskeletal: Mild lumbar and lower thoracic spine degenerative changes. IMPRESSION: 1. Extensive, necrotic pancreatitis were liquefaction of the head, neck,  body and proximal tail of the pancreas with a large amount of confluent fluid within the pancreatic bed, peripancreatic regions and extending into the paracolic gutters, right greater than left. This all has thin peripheral rim enhancement, suggesting the possibility of infected fluid. This has been discussed with Dr. Dalbert Batman. 2. Small bilateral pleural effusions with improvement. 3. Increased bilateral lower lobe atelectasis. 4. Cholelithiasis. 5. Pericholecystic fluid. This is most likely secondary to the pancreatitis rather than acute cholecystitis. Electronically Signed   By: Claudie Revering M.D.   On: 05/16/2015 13:29    Medications / Allergies:  Scheduled Meds: . antiseptic oral rinse  7 mL Mouth Rinse q12n4p  . aspirin  81 mg Oral Daily  . calcitonin  100 Units Intramuscular Daily  . chlorhexidine  15 mL Mouth Rinse BID  . famotidine (PEPCID) IV  20 mg Intravenous Q24H  . heparin  5,000 Units Subcutaneous 3 times per day  . insulin aspart  0-20 Units Subcutaneous 6 times per day  . insulin glargine  20 Units Subcutaneous BID  . meropenem (MERREM) IV  1 g Intravenous Q12H  . metoprolol  5 mg Intravenous 4 times per day  . sodium chloride  10-40 mL Intracatheter Q12H  . sodium chloride  3 mL Intravenous Q12H   Continuous Infusions: . TPN (CLINIMIX) Adult without lytes 70 mL/hr at 05/16/15 1712   And  . fat emulsion 240 mL (05/16/15 1713)  .  sodium bicarbonate 150 mEq in sterile water 1000 mL infusion 75 mL/hr at 05/16/15 1200   PRN Meds:.acetaminophen **OR** acetaminophen, bismuth subsalicylate, diphenhydrAMINE, fentaNYL (SUBLIMAZE) injection, hydrALAZINE, levalbuterol, metoprolol, ondansetron (ZOFRAN) IV **OR** ondansetron (ZOFRAN) IV, promethazine, sodium chloride  Antibiotics: Anti-infectives    Start     Dose/Rate Route Frequency Ordered Stop   05/13/15 1000  fluconazole (DIFLUCAN) IVPB 100 mg     100 mg 50 mL/hr over 60 Minutes Intravenous Every 24 hours 05/12/15 0813 05/16/15  1300   05/12/15 1000  fluconazole (DIFLUCAN) IVPB 200 mg     200 mg 100 mL/hr over 60 Minutes Intravenous  Once 05/12/15 0813 05/12/15 1029   05/08/15 2200  meropenem (MERREM) 1 g in sodium chloride 0.9 % 100 mL IVPB     1 g 200 mL/hr over 30 Minutes Intravenous Every 12 hours 05/08/15 1054     05/03/15 1200  vancomycin (VANCOCIN) 50 mg/mL oral solution 500 mg  Status:  Discontinued     500 mg Oral 4 times per day 05/03/15 0917 05/04/15 1306   05/02/15 1200  cefTRIAXone (ROCEPHIN) 1 g in dextrose 5 % 50 mL IVPB  Status:  Discontinued     1 g 100 mL/hr over 30 Minutes Intravenous Every 24 hours 05/01/15 1448 05/01/15 2004   05/02/15 0600  imipenem-cilastatin (PRIMAXIN) 250 mg in sodium chloride 0.9 % 100 mL IVPB  Status:  Discontinued     250 mg 200 mL/hr over 30 Minutes Intravenous 3 times per day 05/01/15 2025 05/02/15 0027   05/02/15 0030  meropenem (MERREM) 500 mg in sodium chloride 0.9 % 50 mL IVPB  Status:  Discontinued     500 mg 100 mL/hr over 30 Minutes Intravenous Every 12 hours 05/02/15 0027 05/08/15 1054   05/01/15 2030  imipenem-cilastatin (PRIMAXIN) 250 mg in sodium chloride 0.9 % 100 mL IVPB  250 mg 200 mL/hr over 30 Minutes Intravenous NOW 05/01/15 2025 05/01/15 2110   04/30/15 1100  metroNIDAZOLE (FLAGYL) IVPB 500 mg  Status:  Discontinued     500 mg 100 mL/hr over 60 Minutes Intravenous Every 12 hours 04/30/15 0951 05/02/15 0751   04/30/15 1000  cefTRIAXone (ROCEPHIN) 1 g in dextrose 5 % 50 mL IVPB  Status:  Discontinued     1 g 100 mL/hr over 30 Minutes Intravenous Every 24 hours 04/30/15 0939 05/01/15 1205   04/30/15 0945  cefTRIAXone (ROCEPHIN) injection 1 g  Status:  Discontinued     1 g Intramuscular Every 24 hours 04/30/15 0930 04/30/15 0938   04/29/15 0200  imipenem-cilastatin (PRIMAXIN) 250 mg in sodium chloride 0.9 % 100 mL IVPB  Status:  Discontinued     250 mg 200 mL/hr over 30 Minutes Intravenous Every 8 hours 04/28/15 1812 04/30/15 0930   04/28/15 1830   imipenem-cilastatin (PRIMAXIN) 250 mg in sodium chloride 0.9 % 100 mL IVPB     250 mg 200 mL/hr over 30 Minutes Intravenous  Once 04/28/15 1812 04/28/15 1904        Assessment/Plan Acute necrotizing pancreatitis with SIRS Gallstones  CT 1/19-most of pancreas does not perfuse, no air bubbles or signs of infection.  Large fluid collections noted, seem to communicate. Paracolic gutter fluid aspirated by IR remains sterile Clinically she is stable and very very slowly improving.  At this point, do not think the fluid collections are infected.  Will continue with medical management.  Will consider aspiration only if the patient deteriorates.  Recommend starting post pyloric feeds via [place], [facility] Pager [phone])   05/17/2015  7:44 AM

## 2015-05-17 NOTE — Progress Notes (Signed)
Placed post pyloric in x-ray

## 2015-05-17 NOTE — Progress Notes (Signed)
[facility] PROGRESS NOTE  Victoria Morrow ZOX:[id number] DOB: 1950/06/06 DOA: 04/28/2015 PCP: Maryelizabeth Rowan, MD  HPI/Brief narrative 65 year old African-American female with a past medical history of type 2 diabetes on insulin, chronic kidney disease of unknown stage, presented with abdominal pain, nausea and vomiting. She was found to have severe pancreatitis thought to be biliary in origin. She was hospitalized for further management. Critical care medicine was consulted. 1/4 vomited, aspirated, intubated, shock  Required neo since 1/7, transferred care under critical care 1/5 extubated 1/7 SVT requiring cardizem gtt 1/8 Vomiting - NG to LIS 1/12 off cardizem gtt 1/14 care transferred to [facility], with critical care and general surgery following   Assessment/Plan:  necrotizing pancreatitis -CT scan with evidence of gallstones. Patient denies any history of alcohol intake. Ultrasound of the abdomen demonstrates GB thickening with pericholecystic fluid and 5mm stone lodged in neck of gallbladder and no Murphy's sign. CBD is normal size.  -General surgery is following. No clear evidence for acute cholecystitis.  -T continue on abx, tpn, diflucan added on 1/14, bowel rest. -Patient not able to tolerate MRI of abdomen due to not able to hold breath for longer duration. - -CT abdomen pelvis : extensive pancreatic necrosis, liquefaction of the head, neck and body, large amount of extending into paracolic glutter.  -Dr Derrell Lolling reviewed CT scan, no surgery indication at this time unless patient decompensate. Prior fluid aspiration has remain sterile.  -WBC Stable at 17. -appreciate surgery help/   Fever:  Repeated culture , ua +yeast,  started diflucan,   Ct chest/ab/pel on 1/14:  pancreatic necrosis with poor definition of the head and body the pancreas with increased low-attenuation edema compared to prior exam. 2. Increased fluid along the pericolic gutters. The are no clear organized  pseudocyst at this point. The RIGHT pericolic gutter collection is the most organized.  IR consulted  for fluids collection drain and culture. US guided para was done on 1/15, culture :  No growth to date,  Afebrile for more than 24 hours.   Metabolic acidosis.; Continue with  IV fluids and bicarb Gtt at 75 cc.  Improving. Related to pancreatitis.   Hypercalcemia;  might be related to pancreatitis.  PTH normal , Vitamin D level low.  Continue with IV fluids.  Continue with calcitonin day 2, of two doses.   Anemia: likely anemia of chronic disease, patient remain sinus tachycardia, tachypenic, transfuse 1prbc on 1/16 Repeat in am, might need another unit of PRBC.   Hypomagnesemia:at 1.8. Repeat in am.   Septic shock: required intubation and pressors. Now off pressors, extubated.   VDRF; intubated on 1/14 and extubated on 1/15  PAF/SVT:  required cardizem drip, now on lopressor.  Has been in sinus tachycardia since transfer to [facility] lvef 55%-60%, dynamic obstruction reported, grade 1 diastolic dysfunction reported.  increase betablocker iv lopressor from q8hrs to q6hr on 1/17, continue monitor bp and heart rate.  UTI: kleb pneu finished abx treatment  Urinary retention: urology consulted initially during hospitalization due to difficult foley placed placement. She is found to have grade 2 cystocele and urethral stenosis.  ARF on CKD III; better, cr now back to baseline, cr 2.8 on admission, nadir 1.18 on 1/14, today 1,0. Continue with IV fluids.  Good urine out put at 1.4 L/   Transaminitis: LFTs are slightly improved. Probably related to her necrotizing pancreatitis/sepsis/tpn. Ultrasound of the right upper quadrant as above. Hepatitis panel is negative.  Continue to trend LFTs.  Insulin dependent Diabetes mellitus  type 2, poorly controlled, with chronic kidney disease HbA1c is 7.7.  Continue insulin, adjust prn  insulin, TPN Decreased  lantus to 20 units BID 1-18, CBG  in the 80. Also getting insulin with TPN.  Adjust insulin as needed. Appreciate Pharmacy assistance.   Code Status: Full Family Communication: Patient Disposition Plan:  remain in stepdown treatment for necrotizing pancreatitis.    Consultants:  TRH-1/4PCCM -1/14TRH  General Surgery  Urology  Procedures:  Intubation /extubation/ng  Central line  tpn  US guided para on 1/15   Antibiotics: Anti-infectives    Start     Dose/Rate Route Frequency Ordered Stop   05/13/15 1000  fluconazole (DIFLUCAN) IVPB 100 mg     100 mg 50 mL/hr over 60 Minutes Intravenous Every 24 hours 05/12/15 0813 05/16/15 1300   05/12/15 1000  fluconazole (DIFLUCAN) IVPB 200 mg     200 mg 100 mL/hr over 60 Minutes Intravenous  Once 05/12/15 0813 05/12/15 1029   05/08/15 2200  meropenem (MERREM) 1 g in sodium chloride 0.9 % 100 mL IVPB     1 g 200 mL/hr over 30 Minutes Intravenous Every 12 hours 05/08/15 1054     05/03/15 1200  vancomycin (VANCOCIN) 50 mg/mL oral solution 500 mg  Status:  Discontinued     500 mg Oral 4 times per day 05/03/15 0917 05/04/15 1306   05/02/15 1200  cefTRIAXone (ROCEPHIN) 1 g in dextrose 5 % 50 mL IVPB  Status:  Discontinued     1 g 100 mL/hr over 30 Minutes Intravenous Every 24 hours 05/01/15 1448 05/01/15 2004   05/02/15 0600  imipenem-cilastatin (PRIMAXIN) 250 mg in sodium chloride 0.9 % 100 mL IVPB  Status:  Discontinued     250 mg 200 mL/hr over 30 Minutes Intravenous 3 times per day 05/01/15 2025 05/02/15 0027   05/02/15 0030  meropenem (MERREM) 500 mg in sodium chloride 0.9 % 50 mL IVPB  Status:  Discontinued     500 mg 100 mL/hr over 30 Minutes Intravenous Every 12 hours 05/02/15 0027 05/08/15 1054   05/01/15 2030  imipenem-cilastatin (PRIMAXIN) 250 mg in sodium chloride 0.9 % 100 mL IVPB     250 mg 200 mL/hr over 30 Minutes Intravenous NOW 05/01/15 2025 05/01/15 2110   04/30/15 1100  metroNIDAZOLE (FLAGYL) IVPB 500 mg  Status:  Discontinued     500 mg 100  mL/hr over 60 Minutes Intravenous Every 12 hours 04/30/15 0951 05/02/15 0751   04/30/15 1000  cefTRIAXone (ROCEPHIN) 1 g in dextrose 5 % 50 mL IVPB  Status:  Discontinued     1 g 100 mL/hr over 30 Minutes Intravenous Every 24 hours 04/30/15 0939 05/01/15 1205   04/30/15 0945  cefTRIAXone (ROCEPHIN) injection 1 g  Status:  Discontinued     1 g Intramuscular Every 24 hours 04/30/15 0930 04/30/15 0938   04/29/15 0200  imipenem-cilastatin (PRIMAXIN) 250 mg in sodium chloride 0.9 % 100 mL IVPB  Status:  Discontinued     250 mg 200 mL/hr over 30 Minutes Intravenous Every 8 hours 04/28/15 1812 04/30/15 0930   04/28/15 1830  imipenem-cilastatin (PRIMAXIN) 250 mg in sodium chloride 0.9 % 100 mL IVPB     250 mg 200 mL/hr over 30 Minutes Intravenous  Once 04/28/15 1812 04/28/15 1904      HPI/Subjective: Patient denies abdominal pain, breathing ok,   Objective: Filed Vitals:   05/17/15 0300 05/17/15 0400 05/17/15 0500 05/17/15 0600  BP:  130/73  125/71  Pulse: 110 117  123 107  Temp:      TempSrc:      Resp: 23 30 31 26   Height:      Weight:      SpO2: 99% 99% 99% 98%    Intake/Output Summary (Last 24 hours) at 05/17/15 0728 Last data filed at 05/17/15 0700  Gross per 24 hour  Intake   3645 ml  Output   1400 ml  Net   2245 ml   Filed Weights   05/09/15 0553 05/12/15 0923 05/15/15 0500  Weight: 63.2 kg (139 lb 5.3 oz) 62.5 kg (137 lb 12.6 oz) 62.5 kg (137 lb 12.6 oz)    Exam:   General:  Very frail, With tachypnea.   Cardiovascular: tachycardic, s1, s2  Respiratory: decreased at basis, no wheezing, no rales, no rhonchi  Abdomen: obese, decreased BS, non tender  Musculoskeletal: perfused, no clubbing   Data Reviewed: Basic Metabolic Panel:  Recent Labs Lab 05/13/15 0530 05/14/15 0555 05/15/15 0549 05/15/15 1831 05/16/15 0515 05/16/15 1010 05/16/15 1825 05/17/15 0500  NA 139 143 142 139 143  --  140 143  K 3.8 3.6 3.5 4.2 3.7  --  3.9 3.6  CL 115* 118* 118* 116*  119*  --  113* 114*  CO2 15* 14* 15* 16* 15*  --  16* 19*  GLUCOSE 173* 105* 85 443* 169*  --  431* 156*  BUN 34* 34* 35* 36* 37*  --  33* 34*  CREATININE 1.35* 1.11* 1.16* 1.19* 1.07*  --  0.93 0.93  CALCIUM 10.1 10.6* 11.1* 11.2* 11.9* 10.5* 10.3 10.5*  MG 1.3* 2.3 1.9  --  1.8  --   --   --   PHOS 3.0 2.7 2.9  --  3.2  --   --   --    Liver Function Tests:  Recent Labs Lab 05/13/15 0530 05/16/15 0515  AST 56* 58*  ALT 39 48  ALKPHOS 206* 236*  BILITOT 0.8 1.3*  PROT 5.4* 6.3*  ALBUMIN 1.4* 1.6*    Recent Labs Lab 05/12/15 0422  LIPASE 44   No results for input(s): AMMONIA in the last 168 hours. CBC:  Recent Labs Lab 05/13/15 0530 05/14/15 0555 05/15/15 0549 05/16/15 0515 05/17/15 0500  WBC 22.0* 21.0* 19.5* 17.0* 17.2*  NEUTROABS 18.1*  --   --   --   --   HGB 7.1* 9.1* 8.5* 8.4* 7.9*  HCT 21.0* 26.6* 25.5* 25.5* 23.9*  MCV 82.4 83.4 84.4 85.6 85.4  PLT 504* 538* 586* 558* 596*   Cardiac Enzymes: No results for input(s): CKTOTAL, CKMB, CKMBINDEX, TROPONINI in the last 168 hours. BNP (last 3 results) No results for input(s): BNP in the last 8760 hours.  ProBNP (last 3 results) No results for input(s): PROBNP in the last 8760 hours.  CBG:  Recent Labs Lab 05/16/15 1153 05/16/15 1615 05/16/15 2124 05/17/15 0009 05/17/15 0441  GLUCAP 121* 163* 130* 142* 138*    Recent Results (from the past 240 hour(s))  Culture, blood (routine x 2)     Status: None   Collection Time: 05/11/15 10:43 AM  Result Value Ref Range Status   Specimen Description BLOOD BLOOD LEFT HAND  Final   Special Requests BOTTLES DRAWN AEROBIC ONLY 6CC  Final   Culture   Final    NO GROWTH 5 DAYS Performed at [facility]    Report Status 05/16/2015 FINAL  Final  Culture, blood (routine x 2)     Status: None  Collection Time: 05/11/15 10:50 AM  Result Value Ref Range Status   Specimen Description BLOOD BLOOD LEFT HAND  Final   Special Requests IN PEDIATRIC BOTTLE 3CC   Final   Culture   Final    NO GROWTH 5 DAYS Performed at [facility]    Report Status 05/16/2015 FINAL  Final  Culture, body fluid-bottle     Status: None (Preliminary result)   Collection Time: 05/12/15 12:43 PM  Result Value Ref Range Status   Specimen Description FLUID PERITONEAL  Final   Special Requests NONE  Final   Culture   Final    NO GROWTH 4 DAYS Performed at [facility]    Report Status PENDING  Incomplete  Gram stain     Status: None   Collection Time: 05/12/15 12:43 PM  Result Value Ref Range Status   Specimen Description FLUID PERITONEAL  Final   Special Requests NONE  Final   Gram Stain   Final    FEW WBC PRESENT, PREDOMINANTLY MONONUCLEAR NO ORGANISMS SEEN Performed at [facility]    Report Status 05/12/2015 FINAL  Final     Studies: Ct Abdomen Pelvis W Contrast  05/16/2015  CLINICAL DATA:  Abdominal pain. Followup severe pancreatitis with imaging findings concerning for necrosis. EXAM: CT ABDOMEN AND PELVIS WITH CONTRAST TECHNIQUE: Multidetector CT imaging of the abdomen and pelvis was performed using the standard protocol following bolus administration of intravenous contrast. CONTRAST:  OMNIPAQUE IOHEXOL 300 MG/ML  SOLN COMPARISON:  05/11/2015. FINDINGS: Lower chest: Small bilateral pleural effusions, decreased. Increased bilateral lower lobe atelectasis. Hepatobiliary: Multiple small gallstones in the gallbladder measuring up to 4 mm in maximum diameter each. Pericholecystic fluid. No gallbladder wall thickening. No intrahepatic biliary ductal dilatation. Pancreas: Better defined confluent low density fluid replacing the head, neck, body and proximal tail of the pancreas. This currently measures 5.7 cm in thickness and previously measured 5.4 cm in maximum thickness. Extensive pancreatic, peripancreatic and bilateral pericolic gutter fluid is again demonstrated. The previously demonstrated 7.5 x 6.2 cm right paracolic fluid collection  currently measures 8.4 x 6.4 cm on image number 45 were mild peripheral rim enhancement. This communicates with the fluid in the pancreatic bed and extending into the left paracolic gutter, all with mild peripheral rim enhancement. Spleen: Within normal limits in size and appearance. Adrenals/Urinary Tract: Small right kidney with cortical scarring, parenchymal and parapelvic cysts. Small upper pole left renal parapelvic cysts. Foley catheter in the urinary bladder with associated air in the bladder. Diffuse bladder wall thickening. Normal appearing adrenal glands. Stomach/Bowel: No evidence of obstruction, inflammatory process, or abnormal fluid collections. Vascular/Lymphatic: No enlarged lymph nodes. Atheromatous arterial calcifications. Reproductive: Concentric, uniform endometrial thickening and enhancement with fluid in the endometrial cavity. No adnexal masses. Other: Very small umbilical hernia containing fat. Musculoskeletal: Mild lumbar and lower thoracic spine degenerative changes. IMPRESSION: 1. Extensive, necrotic pancreatitis were liquefaction of the head, neck, body and proximal tail of the pancreas with a large amount of confluent fluid within the pancreatic bed, peripancreatic regions and extending into the paracolic gutters, right greater than left. This all has thin peripheral rim enhancement, suggesting the possibility of infected fluid. This has been discussed with Dr. Derrell Lolling. 2. Small bilateral pleural effusions with improvement. 3. Increased bilateral lower lobe atelectasis. 4. Cholelithiasis. 5. Pericholecystic fluid. This is most likely secondary to the pancreatitis rather than acute cholecystitis. Electronically Signed   By: Beckie Salts M.D.   On: 05/16/2015 13:29  Scheduled Meds: . antiseptic oral rinse  7 mL Mouth Rinse q12n4p  . aspirin  81 mg Oral Daily  . calcitonin  100 Units Intramuscular Daily  . chlorhexidine  15 mL Mouth Rinse BID  . famotidine (PEPCID) IV  20 mg  Intravenous Q24H  . heparin  5,000 Units Subcutaneous 3 times per day  . insulin aspart  0-20 Units Subcutaneous 6 times per day  . insulin glargine  20 Units Subcutaneous BID  . meropenem (MERREM) IV  1 g Intravenous Q12H  . metoprolol  5 mg Intravenous 4 times per day  . sodium chloride  10-40 mL Intracatheter Q12H  . sodium chloride  3 mL Intravenous Q12H   Continuous Infusions: . TPN (CLINIMIX) Adult without lytes 70 mL/hr at 05/16/15 1712   And  . fat emulsion 240 mL (05/16/15 1713)  .  sodium bicarbonate 150 mEq in sterile water 1000 mL infusion 75 mL/hr at 05/16/15 1200    Principal Problem:   Acute necrotizing pancreatitis with SIRS Active Problems:   Gallstones   Transaminitis   DM type 2 (diabetes mellitus, type 2) (HCC)   Hyperlipidemia   ARF (acute renal failure) (HCC)   Acute respiratory failure with hypoxia (HCC)   Aspiration pneumonia due to vomit (HCC)   Acute on chronic respiratory failure (HCC)   Protein-calorie malnutrition, moderate (HCC)   Umbilical hernia   Chronic kidney disease (CKD), stage III (moderate)   Olivopontocerebellar atrophy (HCC)   Abscess   Fever   Necrotizing pancreatitis     Hartley Barefoot A MD  [facility] Pager [phone] If 7PM-7AM, please contact night-coverage at [email or web address], password [facility] 05/17/2015, 7:28 AM  LOS: 19 days

## 2015-05-17 NOTE — Progress Notes (Signed)
PARENTERAL NUTRITION CONSULT NOTE - FOLLOW-UP  Pharmacy Consult for TPN Indication: severe necrotizing pancreatitis with ileus  No Known Allergies  Patient Measurements: Height: 5' 2"  (157.5 cm) Weight: 137 lb 12.6 oz (62.5 kg) IBW/kg (Calculated) : 50.1   Vital Signs: Temp: 99 F (37.2 C) (01/19 2332) Temp Source: Oral (01/19 2332) BP: 125/71 mmHg (01/20 0600) Pulse Rate: 107 (01/20 0600) Intake/Output from previous day: 01/19 0701 - 01/20 0700 In: 3490 [I.V.:1350; IV Piggyback:250; TPN:1890] Out: 1400 [Urine:1400]    Labs:  Recent Labs  05/15/15 0549 05/16/15 0515 05/17/15 0500  WBC 19.5* 17.0* 17.2*  HGB 8.5* 8.4* 7.9*  HCT 25.5* 25.5* 23.9*  PLT 586* 558* 596*     Recent Labs  05/15/15 0549  05/16/15 0515 05/16/15 1010 05/16/15 1825 05/17/15 0500  NA 142  < > 143  --  140 143  K 3.5  < > 3.7  --  3.9 3.6  CL 118*  < > 119*  --  113* 114*  CO2 15*  < > 15*  --  16* 19*  GLUCOSE 85  < > 169*  --  431* 156*  BUN 35*  < > 37*  --  33* 34*  CREATININE 1.16*  < > 1.07*  --  0.93 0.93  CALCIUM 11.1*  < > 11.9* 10.5* 10.3 10.5*  MG 1.9  --  1.8  --   --   --   PHOS 2.9  --  3.2  --   --   --   PROT  --   --  6.3*  --   --   --   ALBUMIN  --   --  1.6*  --   --   --   AST  --   --  58*  --   --   --   ALT  --   --  48  --   --   --   ALKPHOS  --   --  236*  --   --   --   BILITOT  --   --  1.3*  --   --   --   < > = values in this interval not displayed. Estimated Creatinine Clearance: 51.8 mL/min (by C-G formula based on Cr of 0.93).    Recent Labs  05/16/15 2124 05/17/15 0009 05/17/15 0441  GLUCAP 130* 142* 138*    Medical History: Past Medical History  Diagnosis Date  . Olivopontocerebellar atrophy (HCC)     Mgmt Dr Brett Fairy  . Diabetes mellitus     Type II. Insulin dependent. Diabetic nephropathy.    Insulin Requirements:   Lantus: 20 units BID Regular: 85 units/day in TPN (maximum concentration) Novolog: 20 units resistant SSI last  24hrs  Current Nutrition: NPO  IVF:  SWI w/ 3 amps bicarb, KCl 20 mEq/L at 46m/hr.  Central access: PICC per IR TPN start date: 1/9  ASSESSMENT  HPI:  31 YOF presents with N/V and abdominal pain on 1/1, found to have gallstone pancreatitis.  CT revealing necrotizing pancreatitis. Surgery note form 1/8 states severe abdominal distention and ileus.  Pharmacy asked to start TPN on 1/7 but PICC has not been able to be placed by IV team.  Plans for IR to place PICC. Of note, she has DM on lantus and metformin prior to admission as well as hyperlipidemia on statin therapy.   Significant events:   1/12: Stool occurrence x2  1/13: NG removed.  Continues on D5W despite glucose d/t chloride elevated.  Discussed with MD and patient needs continued fluid replacement  1/15: US paracentesis  1/16: D5W dc'd and Lantus increased to help control CBGs  1/17: TPN at goal. Added D5W w/ KCl at a low rate to deliver K and guard against hypoglycemia.  1/19 IVF changed to bicarb with KCl (No other source of K with electrolyte-free TPN.)  Today, 05/17/2015:   Glucose - only one CBG above goal of <150 (Range 121-163) with no hypoglycemia last 24hrs.    Electrolytes - Cl remains elevated but improved. CO2 is low, but increased. K wnl, but low end. Corr Ca 12.4 (PTH is WNL.  Calcitonin IM x 2 days, 1/19-1/20).  Phos wnl. (Ca x Phos ~39. Goal < 55). Mag 1.7 remains wnl, but low end.  Vitamin D level low.  Renal - SCr wnl. AKI resolving, UOP remains high  LFTs - Alk Phos and Tbili trending up (no need to adjust trace elements until Tbili > 6). Albumin 1.6 about the same.   TGs - wnl  Prealbumin - decreased to 5.3 (1/16)  NUTRITIONAL GOALS                                                                                           RD recs (updated 1/19): 75-85 g/day protein, 1600-1800  Kcal/day  Clinimix 5/15 at a goal rate of 70 ml/hr + 20% fat emulsion at 10 ml/hr to provide: 84 g/day protein, 1672 Kcal/day.  PLAN                                                                                                                         Now:  Magnesium 1g IV once At 1800 today:  Cont Clinimix 5/15 at 70 ml/hr (goal rate)  Keep electrolytes removed due to hypercalcemia (last dose calcitonin today.)  Cont insulin at 85 units/24 hr   TPN to contain standard multivitamins and trace elements.  Cont 20% fat emulsion at 10 ml/hr.  Cont reduced Lantus 20 units q12h since dextrose removed from IVF.  Cont IVF: SW with bicarb and KCl at 15m/hr   Continue resistant SSI q4h.  TPN lab panels on Mondays & Thursdays.    BMET, mag, phos in AM.  F/u plans for radiology inserted feeding tube and tube feeds.  CGretta ArabPharmD, BCPS Pager [phone]/20/2017 7:10 AM

## 2015-05-17 NOTE — Progress Notes (Signed)
Nutrition Follow-up  DOCUMENTATION CODES:   Not applicable  INTERVENTION:  -Begin Vital 1.5 @ 51mL/hr, increase by 10 every 12 hours to goal rate of 46mL/hr -Regimen will provide 1800 calories, 81g protein, and 916cc free H2O -RD to manage tube-feed, monitor for needs  NUTRITION DIAGNOSIS:   Inadequate oral intake related to inability to eat as evidenced by NPO status.  ongoing  GOAL:   Patient will meet greater than or equal to 90% of their needs  Meeting  MONITOR:   PO intake, Diet advancement, Labs, Weight trends, Skin, I & O's, Other (Comment) (TPN)  REASON FOR ASSESSMENT:   Consult New TPN/TNA  ASSESSMENT:   Victoria Morrow is a 65 y.o. female with a past medical history of type 2 diabetes on insulin, chronic kidney disease of unknown stage, hyperlipidemia, who was in her usual state of health till earlier today when she started developing nausea followed by vomiting. She also started having upper abdominal pain. Symptoms got worse and she decided to come into the hospital for further evaluation. She denies having had similar symptoms in the past. Pain is located in the upper abdomen without any radiation. It's achy pain 10 out of 10 in intensity. No aggravating or relieving factors. No known precipitant factors. Denies any recent antibiotic use. No sick contacts. No recent hospitalization. Denies any fever but has had chills. Denies any blood in the emesis or in the stool.  RD consulted for post pyloric tube feedings. Pt states no issues with nausea or vomiting at this point. No issues tolerating TPN at this time. No fluid accumulation.  Spoke with NP, gave tube-feed recommendation for intervention above. Will be ordered by NP following placement and confirmation.  Labs: CBGs 120-142 Antihypertensives: Apresoline PRN Medications reviewed  Diet Order:  Diet NPO time specified Except for: Ice Chips TPN (CLINIMIX) Adult without lytes TPN (CLINIMIX) Adult without  lytes  Skin:  Reviewed, no issues  Last BM:  1/16  Height:   Ht Readings from Last 1 Encounters:  05/01/15  (1.575 m)    Weight:   Wt Readings from Last 1 Encounters:  05/15/15 137 lb 12.6 oz (62.5 kg)    Ideal Body Weight:  50 kg  BMI:  Body mass index is 25.2 kg/(m^2).  Estimated Nutritional Needs:   Kcal:  1600-1800  Protein:  75-85g  Fluid:  1.8 - 2L/day  EDUCATION NEEDS:   No education needs identified at this time  Dionne Ano. Mykell Genao, MS, RD LDN After Hours/Weekend Pager [phone]

## 2015-05-17 NOTE — Progress Notes (Signed)
Pharmacy Antibiotic Follow-up Note  Victoria Morrow is a 65 y.o. year-old female admitted on 04/28/2015.  The patient is currently on day 20 of antibiotics and specifically day 16 of meropenem for pancreatitis.  Assessment/Plan:  Tmax 99.9 (improving), WBC remain elevated but stable, SCr improved to 0.93 w/ CrCl ~ 52 ml/min.  The dose of Meropenem will be adjusted to 1g IV q8h based on improving renal function.  Temp (24hrs), Avg:99.1 F (37.3 C), Min:98.6 F (37 C), Max:99.9 F (37.7 C)   Recent Labs Lab 05/13/15 0530 05/14/15 0555 05/15/15 0549 05/16/15 0515 05/17/15 0500  WBC 22.0* 21.0* 19.5* 17.0* 17.2*    Recent Labs Lab 05/15/15 0549 05/15/15 1831 05/16/15 0515 05/16/15 1825 05/17/15 0500  CREATININE 1.16* 1.19* 1.07* 0.93 0.93   Estimated Creatinine Clearance: 51.8 mL/min (by C-G formula based on Cr of 0.93).    No Known Allergies  Antimicrobials this admission: 1/1 >> Primaxin >> 1/5 1/3 >> Rocephin >> 1/4 1/3 >> Flagyl >> 1/5 1/5 >> meropenem >>  1/6 >> PO vanc >> 1/7 1/15 >> Fluconazole >> 1/19  Levels/dose changes this admission: 1/20 Increase meropenem to 1g q8h for improved renal fxn.  Microbiology results: 1/1 BCx: NGF 1/1 MRSA PCR: neg 1/2 urine: >100k Klebsiella pneumo (R-amp) 1/5 blood: 1/2 NGF, other canceled 1/5 trach asp: NGF 1/6 C diff: neg 1/14 blood x2: ngtd 1/14 peritoneal fluid: ngtd 1/15 peritoneal fluid: ngtd  Thank you for allowing pharmacy to be a part of this patient's care.  Lynann Beaver PharmD, BCPS Pager [phone] 05/17/2015 10:21 AM

## 2015-05-18 ENCOUNTER — Inpatient Hospital Stay (HOSPITAL_COMMUNITY): Payer: Commercial Managed Care - HMO

## 2015-05-18 LAB — PHOSPHORUS: Phosphorus: 1.9 mg/dL — ABNORMAL LOW (ref 2.5–4.6)

## 2015-05-18 LAB — C DIFFICILE QUICK SCREEN W PCR REFLEX
C DIFFICLE (CDIFF) ANTIGEN: NEGATIVE
C Diff interpretation: NEGATIVE
C Diff toxin: NEGATIVE

## 2015-05-18 LAB — CBC
HEMATOCRIT: 22.9 % — AB (ref 36.0–46.0)
Hemoglobin: 7.7 g/dL — ABNORMAL LOW (ref 12.0–15.0)
MCH: 28.3 pg (ref 26.0–34.0)
MCHC: 33.6 g/dL (ref 30.0–36.0)
MCV: 84.2 fL (ref 78.0–100.0)
Platelets: 594 10*3/uL — ABNORMAL HIGH (ref 150–400)
RBC: 2.72 MIL/uL — ABNORMAL LOW (ref 3.87–5.11)
RDW: 14.3 % (ref 11.5–15.5)
WBC: 15.8 10*3/uL — ABNORMAL HIGH (ref 4.0–10.5)

## 2015-05-18 LAB — GLUCOSE, CAPILLARY
GLUCOSE-CAPILLARY: 166 mg/dL — AB (ref 65–99)
GLUCOSE-CAPILLARY: 174 mg/dL — AB (ref 65–99)
Glucose-Capillary: 152 mg/dL — ABNORMAL HIGH (ref 65–99)
Glucose-Capillary: 149 mg/dL — ABNORMAL HIGH (ref 65–99)
Glucose-Capillary: 150 mg/dL — ABNORMAL HIGH (ref 65–99)
Glucose-Capillary: 141 mg/dL — ABNORMAL HIGH (ref 65–99)
Glucose-Capillary: 191 mg/dL — ABNORMAL HIGH (ref 65–99)

## 2015-05-18 LAB — MAGNESIUM: Magnesium: 1.8 mg/dL (ref 1.7–2.4)

## 2015-05-18 LAB — BASIC METABOLIC PANEL
Anion gap: 10 (ref 5–15)
BUN: 37 mg/dL — AB (ref 6–20)
CHLORIDE: 112 mmol/L — AB (ref 101–111)
CO2: 24 mmol/L (ref 22–32)
Calcium: 9.5 mg/dL (ref 8.9–10.3)
Creatinine, Ser: 1.12 mg/dL — ABNORMAL HIGH (ref 0.44–1.00)
GFR calc Af Amer: 58 mL/min — ABNORMAL LOW (ref >60)
GFR calc non Af Amer: 50 mL/min — ABNORMAL LOW (ref >60)
GLUCOSE: 150 mg/dL — AB (ref 65–99)
POTASSIUM: 3.8 mmol/L (ref 3.5–5.1)
Sodium: 146 mmol/L — ABNORMAL HIGH (ref 135–145)

## 2015-05-18 LAB — PREPARE RBC (CROSSMATCH)

## 2015-05-18 MED ORDER — MAGNESIUM SULFATE IN D5W 10-5 MG/ML-% IV SOLN
1.0000 g | Freq: Once | INTRAVENOUS | Status: AC
  Administered 2015-05-18: 1 g via INTRAVENOUS
  Filled 2015-05-18: qty 100

## 2015-05-18 MED ORDER — SODIUM CHLORIDE 0.9 % IV SOLN: INTRAVENOUS | Status: DC

## 2015-05-18 MED ORDER — TRACE MINERALS CR-CU-MN-SE-ZN 10-1000-500-60 MCG/ML IV SOLN
INTRAVENOUS | Status: AC
  Administered 2015-05-18: 19:00:00 via INTRAVENOUS
  Filled 2015-05-18: qty 1680

## 2015-05-18 MED ORDER — ACETAMINOPHEN 160 MG/5ML PO SOLN
650.0000 mg | Freq: Four times a day (QID) | ORAL | Status: DC | PRN
  Administered 2015-05-18 – 2015-05-19 (×3): 650 mg via ORAL
  Filled 2015-05-18 (×3): qty 20.3

## 2015-05-18 MED ORDER — SODIUM CHLORIDE 0.45 % IV SOLN: INTRAVENOUS | Status: DC

## 2015-05-18 MED ORDER — SODIUM CHLORIDE 0.9 % IV SOLN
1.0000 g | Freq: Two times a day (BID) | INTRAVENOUS | Status: DC
  Administered 2015-05-18 – 2015-05-24 (×12): 1 g via INTRAVENOUS
  Filled 2015-05-18 (×14): qty 1

## 2015-05-18 MED ORDER — DEXTROSE 5 % IV SOLN
10.0000 mmol | Freq: Once | INTRAVENOUS | Status: AC
  Administered 2015-05-18: 10 mmol via INTRAVENOUS
  Filled 2015-05-18: qty 3.33

## 2015-05-18 MED ORDER — SODIUM CHLORIDE 0.9 % IV SOLN
Freq: Once | INTRAVENOUS | Status: AC
  Administered 2015-05-18: 11:00:00 via INTRAVENOUS

## 2015-05-18 MED ORDER — FAT EMULSION 20 % IV EMUL
240.0000 mL | INTRAVENOUS | Status: AC
  Administered 2015-05-18: 240 mL via INTRAVENOUS
  Filled 2015-05-18: qty 250

## 2015-05-18 MED ORDER — POTASSIUM CHLORIDE IN NACL 20-0.45 MEQ/L-% IV SOLN
INTRAVENOUS | Status: DC
  Administered 2015-05-18: 1000 mL via INTRAVENOUS
  Administered 2015-05-19 (×2): via INTRAVENOUS
  Filled 2015-05-18 (×6): qty 1000

## 2015-05-18 NOTE — Progress Notes (Signed)
Subjective: Pt febrile overnight and tachycardic. Denies abd pain +BM  Objective: Vital signs in last 24 hours: Temp:  [98.1 F (36.7 C)-101.1 F (38.4 C)] 99.4 F (37.4 C) (01/21 0455) Pulse Rate:  [97-129] 120 (01/21 0400) Resp:  [23-33] 33 (01/21 0400) BP: (125-148)/(60-73) 125/60 mmHg (01/21 0400) SpO2:  [97 %-100 %] 98 % (01/21 0400) Weight:  [61.3 kg (135 lb 2.3 oz)] 61.3 kg (135 lb 2.3 oz) (01/21 0500) Last BM Date: 05/12/15  Intake/Output from previous day: 01/20 0701 - 01/21 0700 In: 4066.3 [I.V.:1725; NG/GT:150; IV Piggyback:350; TPN:1841.3] Out: 1800 [Urine:1800] Intake/Output this shift:    General appearance: alert and cooperative Resp: clear to auscultation bilaterally Cardio: tachy RR GI: distended, nttp, no rebound/guarding  Lab Results:   Recent Labs  05/17/15 0500 05/18/15 0520  WBC 17.2* 15.8*  HGB 7.9* 7.7*  HCT 23.9* 22.9*  PLT 596* 594*   BMET  Recent Labs  05/17/15 0500 05/18/15 0520  NA 143 146*  K 3.6 3.8  CL 114* 112*  CO2 19* 24  GLUCOSE 156* 150*  BUN 34* 37*  CREATININE 0.93 1.12*  CALCIUM 10.5* 9.5   PT/INR No results for input(s): LABPROT, INR in the last 72 hours. ABG No results for input(s): PHART, HCO3 in the last 72 hours.  Invalid input(s): PCO2, PO2  Studies/Results: Ct Abdomen Pelvis W Contrast  05/16/2015  CLINICAL DATA:  Abdominal pain. Followup severe pancreatitis with imaging findings concerning for necrosis. EXAM: CT ABDOMEN AND PELVIS WITH CONTRAST TECHNIQUE: Multidetector CT imaging of the abdomen and pelvis was performed using the standard protocol following bolus administration of intravenous contrast. CONTRAST:  OMNIPAQUE IOHEXOL 300 MG/ML  SOLN COMPARISON:  05/11/2015. FINDINGS: Lower chest: Small bilateral pleural effusions, decreased. Increased bilateral lower lobe atelectasis. Hepatobiliary: Multiple small gallstones in the gallbladder measuring up to 4 mm in maximum diameter each.  Pericholecystic fluid. No gallbladder wall thickening. No intrahepatic biliary ductal dilatation. Pancreas: Better defined confluent low density fluid replacing the head, neck, body and proximal tail of the pancreas. This currently measures 5.7 cm in thickness and previously measured 5.4 cm in maximum thickness. Extensive pancreatic, peripancreatic and bilateral pericolic gutter fluid is again demonstrated. The previously demonstrated 7.5 x 6.2 cm right paracolic fluid collection currently measures 8.4 x 6.4 cm on image number 45 were mild peripheral rim enhancement. This communicates with the fluid in the pancreatic bed and extending into the left paracolic gutter, all with mild peripheral rim enhancement. Spleen: Within normal limits in size and appearance. Adrenals/Urinary Tract: Small right kidney with cortical scarring, parenchymal and parapelvic cysts. Small upper pole left renal parapelvic cysts. Foley catheter in the urinary bladder with associated air in the bladder. Diffuse bladder wall thickening. Normal appearing adrenal glands. Stomach/Bowel: No evidence of obstruction, inflammatory process, or abnormal fluid collections. Vascular/Lymphatic: No enlarged lymph nodes. Atheromatous arterial calcifications. Reproductive: Concentric, uniform endometrial thickening and enhancement with fluid in the endometrial cavity. No adnexal masses. Other: Very small umbilical hernia containing fat. Musculoskeletal: Mild lumbar and lower thoracic spine degenerative changes. IMPRESSION: 1. Extensive, necrotic pancreatitis were liquefaction of the head, neck, body and proximal tail of the pancreas with a large amount of confluent fluid within the pancreatic bed, peripancreatic regions and extending into the paracolic gutters, right greater than left. This all has thin peripheral rim enhancement, suggesting the possibility of infected fluid. This has been discussed with Dr. Derrell Lolling. 2. Small bilateral pleural effusions with  improvement. 3. Increased bilateral lower lobe atelectasis. 4. Cholelithiasis. 5.  Pericholecystic fluid. This is most likely secondary to the pancreatitis rather than acute cholecystitis. Electronically Signed   By: Beckie Salts M.D.   On: 05/16/2015 13:29   Dg Naso/oro Gtube Thru Duo-reposition  05/17/2015  CLINICAL DATA:  Necrotizing pancreatitis EXAM: POST PYLORIC FEEDING TUBE PLACEMENT UNDER FLUOROSCOPY: TECHNIQUE: Under intermittent fluoroscopic visualization, a nasojejunal feeding tube was advanced from the right nares to the distal duodenum. Small contrast injection confirms appropriate positioning. Tube was secured externally and flushed. No immediate complication. IMPRESSION: Technically successful feeding tube placement to the distal duodenum, ready for post pyloric use. Electronically Signed   By: Corlis Leak M.D.   On: 05/17/2015 13:47    Anti-infectives: Anti-infectives    Start     Dose/Rate Route Frequency Ordered Stop   05/17/15 1800  meropenem (MERREM) 1 g in sodium chloride 0.9 % 100 mL IVPB     1 g 200 mL/hr over 30 Minutes Intravenous Every 8 hours 05/17/15 1022     05/13/15 1000  fluconazole (DIFLUCAN) IVPB 100 mg     100 mg 50 mL/hr over 60 Minutes Intravenous Every 24 hours 05/12/15 0813 05/16/15 1300   05/12/15 1000  fluconazole (DIFLUCAN) IVPB 200 mg     200 mg 100 mL/hr over 60 Minutes Intravenous  Once 05/12/15 0813 05/12/15 1029   05/08/15 2200  meropenem (MERREM) 1 g in sodium chloride 0.9 % 100 mL IVPB  Status:  Discontinued     1 g 200 mL/hr over 30 Minutes Intravenous Every 12 hours 05/08/15 1054 05/17/15 1022   05/03/15 1200  vancomycin (VANCOCIN) 50 mg/mL oral solution 500 mg  Status:  Discontinued     500 mg Oral 4 times per day 05/03/15 0917 05/04/15 1306   05/02/15 1200  cefTRIAXone (ROCEPHIN) 1 g in dextrose 5 % 50 mL IVPB  Status:  Discontinued     1 g 100 mL/hr over 30 Minutes Intravenous Every 24 hours 05/01/15 1448 05/01/15 2004   05/02/15 0600   imipenem-cilastatin (PRIMAXIN) 250 mg in sodium chloride 0.9 % 100 mL IVPB  Status:  Discontinued     250 mg 200 mL/hr over 30 Minutes Intravenous 3 times per day 05/01/15 2025 05/02/15 0027   05/02/15 0030  meropenem (MERREM) 500 mg in sodium chloride 0.9 % 50 mL IVPB  Status:  Discontinued     500 mg 100 mL/hr over 30 Minutes Intravenous Every 12 hours 05/02/15 0027 05/08/15 1054   05/01/15 2030  imipenem-cilastatin (PRIMAXIN) 250 mg in sodium chloride 0.9 % 100 mL IVPB     250 mg 200 mL/hr over 30 Minutes Intravenous NOW 05/01/15 2025 05/01/15 2110   04/30/15 1100  metroNIDAZOLE (FLAGYL) IVPB 500 mg  Status:  Discontinued     500 mg 100 mL/hr over 60 Minutes Intravenous Every 12 hours 04/30/15 0951 05/02/15 0751   04/30/15 1000  cefTRIAXone (ROCEPHIN) 1 g in dextrose 5 % 50 mL IVPB  Status:  Discontinued     1 g 100 mL/hr over 30 Minutes Intravenous Every 24 hours 04/30/15 0939 05/01/15 1205   04/30/15 0945  cefTRIAXone (ROCEPHIN) injection 1 g  Status:  Discontinued     1 g Intramuscular Every 24 hours 04/30/15 0930 04/30/15 0938   04/29/15 0200  imipenem-cilastatin (PRIMAXIN) 250 mg in sodium chloride 0.9 % 100 mL IVPB  Status:  Discontinued     250 mg 200 mL/hr over 30 Minutes Intravenous Every 8 hours 04/28/15 1812 04/30/15 0930   04/28/15 1830  imipenem-cilastatin (PRIMAXIN) 250  mg in sodium chloride 0.9 % 100 mL IVPB     250 mg 200 mL/hr over 30 Minutes Intravenous  Once 04/28/15 1812 04/28/15 1904      Assessment/Plan: Acute necrotizing pancreatitis with SIRS Gallstones -Con't Abx -Con't trickle TFs only -supportive care   LOS: 20 days    Marigene Ehlers., Jed Limerick 05/18/2015

## 2015-05-18 NOTE — Progress Notes (Signed)
Pharmacy Antibiotic Follow-up Note  Victoria Morrow is a 65 y.o. year-old female admitted on 04/28/2015.  The patient is currently on day 21 of antibiotics and specifically day 17 of meropenem for pancreatitis.  Assessment/Plan:  Tmax 101.1, WBC remain elevated but improved at 15.8, SCr increased to 1.12 w/ CrCl ~ 42 ml/min  The dose of Meropenem will be adjusted to 1g IV q12h based on renal function.  Temp (24hrs), Avg:99.7 F (37.6 C), Min:98.1 F (36.7 C), Max:101.1 F (38.4 C)   Recent Labs Lab 05/14/15 0555 05/15/15 0549 05/16/15 0515 05/17/15 0500 05/18/15 0520  WBC 21.0* 19.5* 17.0* 17.2* 15.8*     Recent Labs Lab 05/15/15 1831 05/16/15 0515 05/16/15 1825 05/17/15 0500 05/18/15 0520  CREATININE 1.19* 1.07* 0.93 0.93 1.12*   Estimated Creatinine Clearance: 42.6 mL/min (by C-G formula based on Cr of 1.12).    No Known Allergies  Antimicrobials this admission: 1/1 >> Primaxin >> 1/5 1/3 >> Rocephin >> 1/4 1/3 >> Flagyl >> 1/5 1/5 >> meropenem >>  1/6 >> PO vanc >> 1/7 1/15 >> Fluconazole >> 1/19  Levels/dose changes this admission: 1/20 Increase meropenem to 1g q8h for improved renal fxn.  Microbiology results: 1/1 BCx: NGF 1/1 MRSA PCR: neg 1/2 urine: >100k Klebsiella pneumo (R-amp) 1/5 blood: 1/2 NGF, other canceled 1/5 trach asp: NGF 1/6 C diff: neg 1/14 blood x2: ngtd 1/14 peritoneal fluid: ngtd 1/15 peritoneal fluid: ngtd  Thank you for allowing pharmacy to be a part of this patient's care.  Lynann Beaver PharmD, BCPS Pager [phone] 05/18/2015 8:53 AM

## 2015-05-18 NOTE — Progress Notes (Signed)
PARENTERAL NUTRITION CONSULT NOTE - FOLLOW-UP  Pharmacy Consult for TPN Indication: severe necrotizing pancreatitis with ileus  No Known Allergies  Patient Measurements: Height: 5' 2"  (157.5 cm) Weight: 135 lb 2.3 oz (61.3 kg) IBW/kg (Calculated) : 50.1   Vital Signs: Temp: 99.4 F (37.4 C) (01/21 0455) Temp Source: Oral (01/21 0455) BP: 125/60 mmHg (01/21 0400) Pulse Rate: 120 (01/21 0400) Intake/Output from previous day: 01/20 0701 - 01/21 0700 In: 4066.3 [I.V.:1725; NG/GT:150; IV Piggyback:350; TPN:1841.3] Out: 1800 [Urine:1800]    Labs:  Recent Labs  05/16/15 0515 05/17/15 0500 05/18/15 0520  WBC 17.0* 17.2* 15.8*  HGB 8.4* 7.9* 7.7*  HCT 25.5* 23.9* 22.9*  PLT 558* 596* 594*     Recent Labs  05/16/15 0515  05/16/15 1825 05/17/15 0500 05/18/15 0520  NA 143  --  140 143 146*  K 3.7  --  3.9 3.6 3.8  CL 119*  --  113* 114* 112*  CO2 15*  --  16* 19* 24  GLUCOSE 169*  --  431* 156* 150*  BUN 37*  --  33* 34* 37*  CREATININE 1.07*  --  0.93 0.93 1.12*  CALCIUM 11.9*  < > 10.3 10.5* 9.5  MG 1.8  --   --  1.7 1.8  PHOS 3.2  --   --   --  1.9*  PROT 6.3*  --   --   --   --   ALBUMIN 1.6*  --   --   --   --   AST 58*  --   --   --   --   ALT 48  --   --   --   --   ALKPHOS 236*  --   --   --   --   BILITOT 1.3*  --   --   --   --   < > = values in this interval not displayed. Estimated Creatinine Clearance: 42.6 mL/min (by C-G formula based on Cr of 1.12).    Recent Labs  05/17/15 1537 05/17/15 1954 05/18/15 0106  GLUCAP 152* 152* 149*    Medical History: Past Medical History  Diagnosis Date  . Olivopontocerebellar atrophy (HCC)     Mgmt Dr Brett Fairy  . Diabetes mellitus     Type II. Insulin dependent. Diabetic nephropathy.    Insulin Requirements:   Lantus: 20 units BID Regular: 85 units/day in TPN Novolog: 17 units resistant SSI last 24hrs  Current Nutrition:  Diet: NPO Tube feeds: Vital 1.5c @ 10 ml/hr (provides 16g protein, 355  kcal)  IVF: 0.45% NaCl with KCl 20 mEq/L @ 75 ml/hr  Central access: PICC per IR TPN start date: 1/9  ASSESSMENT                                                                                                          HPI:  8 YOF presents with N/V and abdominal pain on 1/1, found to have gallstone pancreatitis.  CT revealing necrotizing pancreatitis. Surgery note form 1/8 states severe abdominal distention and ileus.  Pharmacy asked to start TPN on 1/7 but PICC has not been able to be placed by IV team.  Plans for IR to place PICC. Of note, she has DM on lantus and metformin prior to admission as well as hyperlipidemia on statin therapy.   Significant events:   1/12: Stool occurrence x2  1/13: NG removed.  Continues on D5W despite glucose d/t chloride elevated.  Discussed with MD and patient needs continued fluid replacement  1/15: US paracentesis  1/16: D5W dc'd and Lantus increased to help control CBGs  1/17: TPN at goal. Added D5W w/ KCl at a low rate to prevent hypoglycemia.  1/19 IVF changed to bicarb with KCl (No other source of K with electrolyte-free TPN.)  1/20 post-pyloric feeding tube placed, begin trickle tube feeds.  1/21 Transfuse 1 unit PRBC for anemia.  Changed NaBicarb fluids to 0.45% NaCl for hypernatremia.  Today, 05/18/2015:   Glucose - CBGs very near goal of <150 (Range 120-152) with no hypoglycemia last 24hrs.    Electrolytes - Na elevated, Cl remains elevated but improved. K, CO2, and Mag are WNL. Phos low.  Corr Ca improved to 11.4 (PTH is WNL; Calcitonin given x 2 doses on 1/19-1/20).  (Ca x Phos ~21. Goal < 55).   Renal - SCr increased, UOP remains high  LFTs - Alk Phos and Tbili trending up (no need to adjust trace elements until Tbili > 6). Albumin 1.6 about the same. (1/19)  TGs - wnl (1/16)  Prealbumin - decreased to 5.3 (1/16)  NUTRITIONAL GOALS                                                                                           RD recs  (updated 1/19): 75-85 g/day protein, 1600-1800 Kcal/day  Clinimix 5/15 at a goal rate of 70 ml/hr + 20% fat emulsion at 10 ml/hr to provide: 84 g/day protein, 1672 Kcal/day.  PLAN                                                                                                                         Now:   KPhos 10 mmol IV x1 dose (also provides 14.5 mEq K+)  Magnesium 1g IV x1 dose  At 1800 today:  Cont Clinimix 5/15 at 70 ml/hr (goal rate) - NO electrolytes d/t hypercalcemia  Increase to insulin 90 units/24 hr in TPN  TPN to contain standard multivitamins and trace elements.  Cont 20% fat emulsion at 10 ml/hr.  Cont Lantus 20 units q12h  Maintenance IVF per MD, changed to 0.45% NaCl with KCl 20 mEq/L @ 75 ml/hr - monitor elevated  Na and Cl.  Continue resistant SSI q4h.  TPN lab panels on Mondays & Thursdays.    BMET, mag, phos in AM.  F/u plans for advancement of tube feeds (cont trickle feeds only for now.)  Gretta Arab PharmD, BCPS Pager [phone] 05/18/2015 7:43 AM

## 2015-05-18 NOTE — Progress Notes (Signed)
[facility] PROGRESS NOTE  Victoria Morrow ZOX:[id number] DOB: [birth year] DOA: 04/28/2015 PCP: Maryelizabeth Rowan, MD  HPI/Brief narrative 65 year old African-American female with a past medical history of type 2 diabetes on insulin, chronic kidney disease of unknown stage, presented with abdominal pain, nausea and vomiting. She was found to have severe pancreatitis thought to be biliary in origin. She was hospitalized for further management. Critical care medicine was consulted. 1/4 vomited, aspirated, intubated, shock  Required neo since 1/7, transferred care under critical care 1/5 extubated 1/7 SVT requiring cardizem gtt 1/8 Vomiting - NG to LIS 1/12 off cardizem gtt 1/14 care transferred to [facility], with critical care and general surgery following   Assessment/Plan:  necrotizing pancreatitis -CT scan with evidence of gallstones. Patient denies any history of alcohol intake. Ultrasound of the abdomen demonstrates GB thickening with pericholecystic fluid and 5mm stone lodged in neck of gallbladder and no Murphy's sign. CBD is normal size.  -General surgery is following. No clear evidence for acute cholecystitis.  -T continue on abx, tpn, diflucan added on 1/14, bowel rest. -Patient not able to tolerate MRI of abdomen due to not able to hold breath for longer duration. - -CT abdomen pelvis : extensive pancreatic necrosis, liquefaction of the head, neck and body, large amount of extending into paracolic glutter.  -Dr Derrell Lolling reviewed CT scan, no surgery indication at this time unless patient decompensate. Prior fluid aspiration has remain sterile.  -WBC has decrease to 15, spike fever overnight.  -appreciate surgery help/  -post pyloric NG tube in place.   Fever:  Repeated culture , ua +yeast,  started diflucan,   Ct chest/ab/pel on 1/14:  pancreatic necrosis with poor definition of the head and body the pancreas with increased low-attenuation edema compared to prior exam. 2. Increased  fluid along the pericolic gutters. The are no clear organized pseudocyst at this point. The RIGHT pericolic gutter collection is the most organized.  IR consulted  for fluids collection drain and culture. US guided para was done on 1/15, culture :  No growth to date,  Spike fever. Will check chest x ray , also with diarrhea, will check C diff.   Metabolic acidosis.; Resolved with bicarb Gtt, change IV fluids to NS.  Related to pancreatitis.   Hypercalcemia;  might be related to pancreatitis.  PTH normal , Vitamin D level low.  Continue with IV fluids.  Received  calcitonin two doses.  Repeat labs in am, and albumin level.   Anemia: likely anemia of chronic disease, patient remain sinus tachycardia, tachypenic, transfuse 1prbc on 1/16 WiIll transfuse one unit PRBC  Hypomagnesemia:at 1.8. Repeat in am.   Septic shock: required intubation and pressors. Now off pressors, extubated.   VDRF; intubated on 1/14 and extubated on 1/15  PAF/SVT:  required cardizem drip, now on lopressor.  Has been in sinus tachycardia since transfer to [facility] 55%-60%, dynamic obstruction reported, grade 1 diastolic dysfunction reported.  increase betablocker iv lopressor from q8hrs to q6hr on 1/17, continue monitor bp and heart rate.  UTI: kleb pneu finished abx treatment  Urinary retention: urology consulted initially during hospitalization due to difficult foley placed placement. She is found to have grade 2 cystocele and urethral stenosis.  ARF on CKD III; better, cr now back to baseline, cr 2.8 on admission, nadir 1.18 on 1/14, today 1,0. Continue with IV fluids.  Good urine out put at 1.8, mild increase BUN and Cr. Continue with IV fluids. Repeat labs in am. Mild increase sodium, change IV  fluids to half NS.   Transaminitis: LFTs are slightly improved. Probably related to her necrotizing pancreatitis/sepsis/tpn. Ultrasound of the right upper quadrant as above. Hepatitis panel is negative.   Continue to trend LFTs.  Insulin dependent Diabetes mellitus type 2, poorly controlled, with chronic kidney disease HbA1c is 7.7.  Continue insulin, adjust prn  insulin, TPN Decreased  lantus to 20 units BID 1-18, CBG in the 80. Also getting insulin with TPN.  Adjust insulin as needed. Appreciate Pharmacy assistance.   Code Status: Full Family Communication: Patient Disposition Plan:  remain in stepdown treatment for necrotizing pancreatitis.    Consultants:  TRH-1/4PCCM -1/14TRH  General Surgery  Urology  Procedures:  Intubation /extubation/ng  Central line  tpn  US guided para on 1/15   Antibiotics: Anti-infectives    Start     Dose/Rate Route Frequency Ordered Stop   05/17/15 1800  meropenem (MERREM) 1 g in sodium chloride 0.9 % 100 mL IVPB     1 g 200 mL/hr over 30 Minutes Intravenous Every 8 hours 05/17/15 1022     05/13/15 1000  fluconazole (DIFLUCAN) IVPB 100 mg     100 mg 50 mL/hr over 60 Minutes Intravenous Every 24 hours 05/12/15 0813 05/16/15 1300   05/12/15 1000  fluconazole (DIFLUCAN) IVPB 200 mg     200 mg 100 mL/hr over 60 Minutes Intravenous  Once 05/12/15 0813 05/12/15 1029   05/08/15 2200  meropenem (MERREM) 1 g in sodium chloride 0.9 % 100 mL IVPB  Status:  Discontinued     1 g 200 mL/hr over 30 Minutes Intravenous Every 12 hours 05/08/15 1054 05/17/15 1022   05/03/15 1200  vancomycin (VANCOCIN) 50 mg/mL oral solution 500 mg  Status:  Discontinued     500 mg Oral 4 times per day 05/03/15 0917 05/04/15 1306   05/02/15 1200  cefTRIAXone (ROCEPHIN) 1 g in dextrose 5 % 50 mL IVPB  Status:  Discontinued     1 g 100 mL/hr over 30 Minutes Intravenous Every 24 hours 05/01/15 1448 05/01/15 2004   05/02/15 0600  imipenem-cilastatin (PRIMAXIN) 250 mg in sodium chloride 0.9 % 100 mL IVPB  Status:  Discontinued     250 mg 200 mL/hr over 30 Minutes Intravenous 3 times per day 05/01/15 2025 05/02/15 0027   05/02/15 0030  meropenem (MERREM) 500 mg in sodium  chloride 0.9 % 50 mL IVPB  Status:  Discontinued     500 mg 100 mL/hr over 30 Minutes Intravenous Every 12 hours 05/02/15 0027 05/08/15 1054   05/01/15 2030  imipenem-cilastatin (PRIMAXIN) 250 mg in sodium chloride 0.9 % 100 mL IVPB     250 mg 200 mL/hr over 30 Minutes Intravenous NOW 05/01/15 2025 05/01/15 2110   04/30/15 1100  metroNIDAZOLE (FLAGYL) IVPB 500 mg  Status:  Discontinued     500 mg 100 mL/hr over 60 Minutes Intravenous Every 12 hours 04/30/15 0951 05/02/15 0751   04/30/15 1000  cefTRIAXone (ROCEPHIN) 1 g in dextrose 5 % 50 mL IVPB  Status:  Discontinued     1 g 100 mL/hr over 30 Minutes Intravenous Every 24 hours 04/30/15 0939 05/01/15 1205   04/30/15 0945  cefTRIAXone (ROCEPHIN) injection 1 g  Status:  Discontinued     1 g Intramuscular Every 24 hours 04/30/15 0930 04/30/15 0938   04/29/15 0200  imipenem-cilastatin (PRIMAXIN) 250 mg in sodium chloride 0.9 % 100 mL IVPB  Status:  Discontinued     250 mg 200 mL/hr over 30 Minutes Intravenous  Every 8 hours 04/28/15 1812 04/30/15 0930   04/28/15 1830  imipenem-cilastatin (PRIMAXIN) 250 mg in sodium chloride 0.9 % 100 mL IVPB     250 mg 200 mL/hr over 30 Minutes Intravenous  Once 04/28/15 1812 04/28/15 1904      HPI/Subjective: More interactive. Denies abdominal pain. Patient with diarrhea.   Objective: Filed Vitals:   05/18/15 0000 05/18/15 0400 05/18/15 0455 05/18/15 0500  BP: 145/65 125/60    Pulse: 127 120    Temp: 101.1 F (38.4 C)  99.4 F (37.4 C)   TempSrc: Oral  Oral   Resp: 29 33    Height:      Weight:    61.3 kg (135 lb 2.3 oz)  SpO2: 99% 98%      Intake/Output Summary (Last 24 hours) at 05/18/15 0754 Last data filed at 05/18/15 0600  Gross per 24 hour  Intake 4066.32 ml  Output   1800 ml  Net 2266.32 ml   Filed Weights   05/12/15 0923 05/15/15 0500 05/18/15 0500  Weight: 62.5 kg (137 lb 12.6 oz) 62.5 kg (137 lb 12.6 oz) 61.3 kg (135 lb 2.3 oz)    Exam:   General:  Very frail, less  tachypnic  Cardiovascular: tachycardic, s1, s2  Respiratory: decreased at basis, no wheezing, no rales, no rhonchi  Abdomen: obese, positive BS, non tender, less distended.   Musculoskeletal: perfused, no clubbing   Data Reviewed: Basic Metabolic Panel:  Recent Labs Lab 05/13/15 0530 05/14/15 0555 05/15/15 0549 05/15/15 1831 05/16/15 0515 05/16/15 1010 05/16/15 1825 05/17/15 0500 05/18/15 0520  NA 139 143 142 139 143  --  140 143 146*  K 3.8 3.6 3.5 4.2 3.7  --  3.9 3.6 3.8  CL 115* 118* 118* 116* 119*  --  113* 114* 112*  CO2 15* 14* 15* 16* 15*  --  16* 19* 24  GLUCOSE 173* 105* 85 443* 169*  --  431* 156* 150*  BUN 34* 34* 35* 36* 37*  --  33* 34* 37*  CREATININE 1.35* 1.11* 1.16* 1.19* 1.07*  --  0.93 0.93 1.12*  CALCIUM 10.1 10.6* 11.1* 11.2* 11.9* 10.5* 10.3 10.5* 9.5  MG 1.3* 2.3 1.9  --  1.8  --   --  1.7 1.8  PHOS 3.0 2.7 2.9  --  3.2  --   --   --  1.9*   Liver Function Tests:  Recent Labs Lab 05/13/15 0530 05/16/15 0515  AST 56* 58*  ALT 39 48  ALKPHOS 206* 236*  BILITOT 0.8 1.3*  PROT 5.4* 6.3*  ALBUMIN 1.4* 1.6*    Recent Labs Lab 05/12/15 0422  LIPASE 44   No results for input(s): AMMONIA in the last 168 hours. CBC:  Recent Labs Lab 05/13/15 0530 05/14/15 0555 05/15/15 0549 05/16/15 0515 05/17/15 0500 05/18/15 0520  WBC 22.0* 21.0* 19.5* 17.0* 17.2* 15.8*  NEUTROABS 18.1*  --   --   --   --   --   HGB 7.1* 9.1* 8.5* 8.4* 7.9* 7.7*  HCT 21.0* 26.6* 25.5* 25.5* 23.9* 22.9*  MCV 82.4 83.4 84.4 85.6 85.4 84.2  PLT 504* 538* 586* 558* 596* 594*   Cardiac Enzymes: No results for input(s): CKTOTAL, CKMB, CKMBINDEX, TROPONINI in the last 168 hours. BNP (last 3 results) No results for input(s): BNP in the last 8760 hours.  ProBNP (last 3 results) No results for input(s): PROBNP in the last 8760 hours.  CBG:  Recent Labs Lab 05/17/15 0735 05/17/15 1201  05/17/15 1537 05/17/15 1954 05/18/15 0106  GLUCAP 120* 149* 152* 152*  149*    Recent Results (from the past 240 hour(s))  Culture, blood (routine x 2)     Status: None   Collection Time: 05/11/15 10:43 AM  Result Value Ref Range Status   Specimen Description BLOOD BLOOD LEFT HAND  Final   Special Requests BOTTLES DRAWN AEROBIC ONLY 6CC  Final   Culture   Final    NO GROWTH 5 DAYS Performed at [facility]    Report Status 05/16/2015 FINAL  Final  Culture, blood (routine x 2)     Status: None   Collection Time: 05/11/15 10:50 AM  Result Value Ref Range Status   Specimen Description BLOOD BLOOD LEFT HAND  Final   Special Requests IN PEDIATRIC BOTTLE 3CC  Final   Culture   Final    NO GROWTH 5 DAYS Performed at [facility]    Report Status 05/16/2015 FINAL  Final  Culture, body fluid-bottle     Status: None   Collection Time: 05/12/15 12:43 PM  Result Value Ref Range Status   Specimen Description FLUID PERITONEAL  Final   Special Requests NONE  Final   Culture   Final    NO GROWTH 5 DAYS Performed at [facility]    Report Status 05/17/2015 FINAL  Final  Gram stain     Status: None   Collection Time: 05/12/15 12:43 PM  Result Value Ref Range Status   Specimen Description FLUID PERITONEAL  Final   Special Requests NONE  Final   Gram Stain   Final    FEW WBC PRESENT, PREDOMINANTLY MONONUCLEAR NO ORGANISMS SEEN Performed at [facility]    Report Status 05/12/2015 FINAL  Final     Studies: Ct Abdomen Pelvis W Contrast  05/16/2015  CLINICAL DATA:  Abdominal pain. Followup severe pancreatitis with imaging findings concerning for necrosis. EXAM: CT ABDOMEN AND PELVIS WITH CONTRAST TECHNIQUE: Multidetector CT imaging of the abdomen and pelvis was performed using the standard protocol following bolus administration of intravenous contrast. CONTRAST:  OMNIPAQUE IOHEXOL 300 MG/ML  SOLN COMPARISON:  05/11/2015. FINDINGS: Lower chest: Small bilateral pleural effusions, decreased. Increased bilateral lower lobe  atelectasis. Hepatobiliary: Multiple small gallstones in the gallbladder measuring up to 4 mm in maximum diameter each. Pericholecystic fluid. No gallbladder wall thickening. No intrahepatic biliary ductal dilatation. Pancreas: Better defined confluent low density fluid replacing the head, neck, body and proximal tail of the pancreas. This currently measures 5.7 cm in thickness and previously measured 5.4 cm in maximum thickness. Extensive pancreatic, peripancreatic and bilateral pericolic gutter fluid is again demonstrated. The previously demonstrated 7.5 x 6.2 cm right paracolic fluid collection currently measures 8.4 x 6.4 cm on image number 45 were mild peripheral rim enhancement. This communicates with the fluid in the pancreatic bed and extending into the left paracolic gutter, all with mild peripheral rim enhancement. Spleen: Within normal limits in size and appearance. Adrenals/Urinary Tract: Small right kidney with cortical scarring, parenchymal and parapelvic cysts. Small upper pole left renal parapelvic cysts. Foley catheter in the urinary bladder with associated air in the bladder. Diffuse bladder wall thickening. Normal appearing adrenal glands. Stomach/Bowel: No evidence of obstruction, inflammatory process, or abnormal fluid collections. Vascular/Lymphatic: No enlarged lymph nodes. Atheromatous arterial calcifications. Reproductive: Concentric, uniform endometrial thickening and enhancement with fluid in the endometrial cavity. No adnexal masses. Other: Very small umbilical hernia containing fat. Musculoskeletal: Mild lumbar and lower thoracic spine degenerative  changes. IMPRESSION: 1. Extensive, necrotic pancreatitis were liquefaction of the head, neck, body and proximal tail of the pancreas with a large amount of confluent fluid within the pancreatic bed, peripancreatic regions and extending into the paracolic gutters, right greater than left. This all has thin peripheral rim enhancement, suggesting  the possibility of infected fluid. This has been discussed with Dr. Derrell Lolling. 2. Small bilateral pleural effusions with improvement. 3. Increased bilateral lower lobe atelectasis. 4. Cholelithiasis. 5. Pericholecystic fluid. This is most likely secondary to the pancreatitis rather than acute cholecystitis. Electronically Signed   By: Beckie Salts M.D.   On: 05/16/2015 13:29   Dg Naso/oro Gtube Thru Duo-reposition  05/17/2015  CLINICAL DATA:  Necrotizing pancreatitis EXAM: POST PYLORIC FEEDING TUBE PLACEMENT UNDER FLUOROSCOPY: TECHNIQUE: Under intermittent fluoroscopic visualization, a nasojejunal feeding tube was advanced from the right nares to the distal duodenum. Small contrast injection confirms appropriate positioning. Tube was secured externally and flushed. No immediate complication. IMPRESSION: Technically successful feeding tube placement to the distal duodenum, ready for post pyloric use. Electronically Signed   By: Corlis Leak M.D.   On: 05/17/2015 13:47    Scheduled Meds: . sodium chloride   Intravenous Once  . antiseptic oral rinse  7 mL Mouth Rinse q12n4p  . aspirin  81 mg Oral Daily  . chlorhexidine  15 mL Mouth Rinse BID  . famotidine (PEPCID) IV  20 mg Intravenous Q24H  . heparin  5,000 Units Subcutaneous 3 times per day  . insulin aspart  0-20 Units Subcutaneous 6 times per day  . insulin glargine  20 Units Subcutaneous BID  . meropenem (MERREM) IV  1 g Intravenous Q8H  . metoprolol  5 mg Intravenous 4 times per day  . sodium chloride  10-40 mL Intracatheter Q12H  . sodium chloride  3 mL Intravenous Q12H   Continuous Infusions: . sodium chloride    . TPN (CLINIMIX) Adult without lytes 70 mL/hr at 05/17/15 1752   And  . fat emulsion 240 mL (05/17/15 1752)  . feeding supplement (VITAL 1.5 CAL) 1,000 mL (05/17/15 1500)    Principal Problem:   Acute necrotizing pancreatitis with SIRS Active Problems:   Gallstones   Transaminitis   DM type 2 (diabetes mellitus, type 2)  (HCC)   Hyperlipidemia   ARF (acute renal failure) (HCC)   Acute respiratory failure with hypoxia (HCC)   Aspiration pneumonia due to vomit (HCC)   Acute on chronic respiratory failure (HCC)   Protein-calorie malnutrition, moderate (HCC)   Umbilical hernia   Chronic kidney disease (CKD), stage III (moderate)   Olivopontocerebellar atrophy (HCC)   Abscess   Fever   Necrotizing pancreatitis     Hartley Barefoot A MD  [facility] Pager [phone] If 7PM-7AM, please contact night-coverage at [email or web address], password [facility] 05/18/2015, 7:54 AM  LOS: 20 days

## 2015-05-19 LAB — URINALYSIS, ROUTINE W REFLEX MICROSCOPIC
BILIRUBIN URINE: NEGATIVE
Glucose, UA: NEGATIVE mg/dL
KETONES UR: NEGATIVE mg/dL
NITRITE: NEGATIVE
PROTEIN: NEGATIVE mg/dL
SPECIFIC GRAVITY, URINE: 1.008 (ref 1.005–1.030)
pH: 7 (ref 5.0–8.0)

## 2015-05-19 LAB — BASIC METABOLIC PANEL
ANION GAP: 11 (ref 5–15)
BUN: 32 mg/dL — AB (ref 6–20)
CHLORIDE: 112 mmol/L — AB (ref 101–111)
CO2: 20 mmol/L — ABNORMAL LOW (ref 22–32)
Calcium: 9.6 mg/dL (ref 8.9–10.3)
Creatinine, Ser: 0.99 mg/dL (ref 0.44–1.00)
GFR calc Af Amer: >60 mL/min (ref >60)
GFR, EST NON AFRICAN AMERICAN: 58 mL/min — AB (ref >60)
GLUCOSE: 126 mg/dL — AB (ref 65–99)
POTASSIUM: 3.6 mmol/L (ref 3.5–5.1)
SODIUM: 143 mmol/L (ref 135–145)

## 2015-05-19 LAB — URINE MICROSCOPIC-ADD ON
Bacteria, UA: NONE SEEN
RBC / HPF: NONE SEEN RBC/hpf (ref 0–5)
SQUAMOUS EPITHELIAL / LPF: NONE SEEN

## 2015-05-19 LAB — CBC
HEMATOCRIT: 28.3 % — AB (ref 36.0–46.0)
HEMOGLOBIN: 9.3 g/dL — AB (ref 12.0–15.0)
MCH: 28.3 pg (ref 26.0–34.0)
MCHC: 32.9 g/dL (ref 30.0–36.0)
MCV: 86 fL (ref 78.0–100.0)
Platelets: 584 10*3/uL — ABNORMAL HIGH (ref 150–400)
RBC: 3.29 MIL/uL — ABNORMAL LOW (ref 3.87–5.11)
RDW: 14.1 % (ref 11.5–15.5)
WBC: 16.2 10*3/uL — AB (ref 4.0–10.5)

## 2015-05-19 LAB — GLUCOSE, CAPILLARY
GLUCOSE-CAPILLARY: 123 mg/dL — AB (ref 65–99)
GLUCOSE-CAPILLARY: 146 mg/dL — AB (ref 65–99)
GLUCOSE-CAPILLARY: 147 mg/dL — AB (ref 65–99)
GLUCOSE-CAPILLARY: 159 mg/dL — AB (ref 65–99)
GLUCOSE-CAPILLARY: 165 mg/dL — AB (ref 65–99)
GLUCOSE-CAPILLARY: 188 mg/dL — AB (ref 65–99)

## 2015-05-19 LAB — ALBUMIN: ALBUMIN: 1.6 g/dL — AB (ref 3.5–5.0)

## 2015-05-19 LAB — PHOSPHORUS: PHOSPHORUS: 2.5 mg/dL (ref 2.5–4.6)

## 2015-05-19 LAB — MAGNESIUM: Magnesium: 1.8 mg/dL (ref 1.7–2.4)

## 2015-05-19 MED ORDER — MAGNESIUM SULFATE 2 GM/50ML IV SOLN
2.0000 g | Freq: Once | INTRAVENOUS | Status: AC
Start: 2015-05-19 — End: 2015-05-19
  Administered 2015-05-19: 2 g via INTRAVENOUS
  Filled 2015-05-19: qty 50

## 2015-05-19 MED ORDER — POTASSIUM PHOSPHATES 15 MMOLE/5ML IV SOLN
10.0000 mmol | Freq: Once | INTRAVENOUS | Status: AC
  Administered 2015-05-19: 10 mmol via INTRAVENOUS
  Filled 2015-05-19: qty 3.33

## 2015-05-19 MED ORDER — TRACE MINERALS CR-CU-MN-SE-ZN 10-1000-500-60 MCG/ML IV SOLN
INTRAVENOUS | Status: AC
  Administered 2015-05-19: 18:00:00 via INTRAVENOUS
  Filled 2015-05-19: qty 1680

## 2015-05-19 MED ORDER — FAT EMULSION 20 % IV EMUL
240.0000 mL | INTRAVENOUS | Status: AC
  Administered 2015-05-19: 240 mL via INTRAVENOUS
  Filled 2015-05-19: qty 250

## 2015-05-19 NOTE — Progress Notes (Signed)
Subjective: Pt with no acute changes overnight.   Objective: Vital signs in last 24 hours: Temp:  [97.7 F (36.5 C)-101.4 F (38.6 C)] 99.7 F (37.6 C) (01/22 0645) Pulse Rate:  [101-121] 113 (01/22 0600) Resp:  [23-45] 25 (01/22 0600) BP: (94-144)/(59-71) 122/59 mmHg (01/22 0600) SpO2:  [97 %-100 %] 97 % (01/22 0600) Weight:  [64.7 kg (142 lb 10.2 oz)] 64.7 kg (142 lb 10.2 oz) (01/22 0500) Last BM Date: 05/18/15  Intake/Output from previous day: 01/21 0701 - 01/22 0700 In: 4050.8 [I.V.:1248.8; Blood:332.1; NG/GT:230; IV Piggyback:400; TPN:1840] Out: 2400 [Urine:2400] Intake/Output this shift:    General appearance: alert and cooperative Cardio: tachy, RR GI: soft, ttp at epigastrum, no rebound/guarding  Lab Results:   Recent Labs  05/18/15 0520 05/19/15 0456  WBC 15.8* 16.2*  HGB 7.7* 9.3*  HCT 22.9* 28.3*  PLT 594* 584*   BMET  Recent Labs  05/18/15 0520 05/19/15 0456  NA 146* 143  K 3.8 3.6  CL 112* 112*  CO2 24 20*  GLUCOSE 150* 126*  BUN 37* 32*  CREATININE 1.12* 0.99  CALCIUM 9.5 9.6   PT/INR No results for input(s): LABPROT, INR in the last 72 hours. ABG No results for input(s): PHART, HCO3 in the last 72 hours.  Invalid input(s): PCO2, PO2  Studies/Results: Dg Chest Port 1 View  05/18/2015  CLINICAL DATA:  Shortness of breath, weakness and increased temperature. EXAM: PORTABLE CHEST 1 VIEW COMPARISON:  CT of the chest dated 05/11/2015 FINDINGS: Right PICC line is in stable position, tip at the cavoatrial junction. There has been a placement of feeding catheter, tip collimated off the image at the level of the gastric antrum. Cardiomediastinal silhouette is normal. Mediastinal contours appear intact. There are low lung volumes with bibasilar atelectasis. Small subpulmonic effusions also possible. There is no evidence of focal airspace consolidation, pleural effusion or pneumothorax. Osseous structures are without acute abnormality. Soft  tissues are grossly normal. IMPRESSION: Low lung volumes with bibasilar atelectasis and likely bilateral small subpulmonic effusions. Placement of feeding catheter, tip collimated off the image. Electronically Signed   By: Ted Mcalpine M.D.   On: 05/18/2015 10:59   Dg Naso/oro Gtube Thru Duo-reposition  05/17/2015  CLINICAL DATA:  Necrotizing pancreatitis EXAM: POST PYLORIC FEEDING TUBE PLACEMENT UNDER FLUOROSCOPY: TECHNIQUE: Under intermittent fluoroscopic visualization, a nasojejunal feeding tube was advanced from the right nares to the distal duodenum. Small contrast injection confirms appropriate positioning. Tube was secured externally and flushed. No immediate complication. IMPRESSION: Technically successful feeding tube placement to the distal duodenum, ready for post pyloric use. Electronically Signed   By: Corlis Leak M.D.   On: 05/17/2015 13:47    Anti-infectives: Anti-infectives    Start     Dose/Rate Route Frequency Ordered Stop   05/18/15 2200  meropenem (MERREM) 1 g in sodium chloride 0.9 % 100 mL IVPB     1 g 200 mL/hr over 30 Minutes Intravenous Every 12 hours 05/18/15 0859     05/17/15 1800  meropenem (MERREM) 1 g in sodium chloride 0.9 % 100 mL IVPB  Status:  Discontinued     1 g 200 mL/hr over 30 Minutes Intravenous Every 8 hours 05/17/15 1022 05/18/15 0859   05/13/15 1000  fluconazole (DIFLUCAN) IVPB 100 mg     100 mg 50 mL/hr over 60 Minutes Intravenous Every 24 hours 05/12/15 0813 05/16/15 1300   05/12/15 1000  fluconazole (DIFLUCAN) IVPB 200 mg     200 mg 100 mL/hr over 60 Minutes  Intravenous  Once 05/12/15 0813 05/12/15 1029   05/08/15 2200  meropenem (MERREM) 1 g in sodium chloride 0.9 % 100 mL IVPB  Status:  Discontinued     1 g 200 mL/hr over 30 Minutes Intravenous Every 12 hours 05/08/15 1054 05/17/15 1022   05/03/15 1200  vancomycin (VANCOCIN) 50 mg/mL oral solution 500 mg  Status:  Discontinued     500 mg Oral 4 times per day 05/03/15 0917 05/04/15 1306    05/02/15 1200  cefTRIAXone (ROCEPHIN) 1 g in dextrose 5 % 50 mL IVPB  Status:  Discontinued     1 g 100 mL/hr over 30 Minutes Intravenous Every 24 hours 05/01/15 1448 05/01/15 2004   05/02/15 0600  imipenem-cilastatin (PRIMAXIN) 250 mg in sodium chloride 0.9 % 100 mL IVPB  Status:  Discontinued     250 mg 200 mL/hr over 30 Minutes Intravenous 3 times per day 05/01/15 2025 05/02/15 0027   05/02/15 0030  meropenem (MERREM) 500 mg in sodium chloride 0.9 % 50 mL IVPB  Status:  Discontinued     500 mg 100 mL/hr over 30 Minutes Intravenous Every 12 hours 05/02/15 0027 05/08/15 1054   05/01/15 2030  imipenem-cilastatin (PRIMAXIN) 250 mg in sodium chloride 0.9 % 100 mL IVPB     250 mg 200 mL/hr over 30 Minutes Intravenous NOW 05/01/15 2025 05/01/15 2110   04/30/15 1100  metroNIDAZOLE (FLAGYL) IVPB 500 mg  Status:  Discontinued     500 mg 100 mL/hr over 60 Minutes Intravenous Every 12 hours 04/30/15 0951 05/02/15 0751   04/30/15 1000  cefTRIAXone (ROCEPHIN) 1 g in dextrose 5 % 50 mL IVPB  Status:  Discontinued     1 g 100 mL/hr over 30 Minutes Intravenous Every 24 hours 04/30/15 0939 05/01/15 1205   04/30/15 0945  cefTRIAXone (ROCEPHIN) injection 1 g  Status:  Discontinued     1 g Intramuscular Every 24 hours 04/30/15 0930 04/30/15 0938   04/29/15 0200  imipenem-cilastatin (PRIMAXIN) 250 mg in sodium chloride 0.9 % 100 mL IVPB  Status:  Discontinued     250 mg 200 mL/hr over 30 Minutes Intravenous Every 8 hours 04/28/15 1812 04/30/15 0930   04/28/15 1830  imipenem-cilastatin (PRIMAXIN) 250 mg in sodium chloride 0.9 % 100 mL IVPB     250 mg 200 mL/hr over 30 Minutes Intravenous  Once 04/28/15 1812 04/28/15 1904      Assessment/Plan: Acute necrotizing pancreatitis with SIRS Gallstones -Con't Abx -Con't trickle TFs only -supportive care   LOS: 21 days    Victoria Morrow., Jed Limerick 05/19/2015

## 2015-05-19 NOTE — Progress Notes (Signed)
PARENTERAL NUTRITION CONSULT NOTE - FOLLOW-UP  Pharmacy Consult for TPN Indication: severe necrotizing pancreatitis with ileus  No Known Allergies  Patient Measurements: Height: 5' 2"  (157.5 cm) Weight: 142 lb 10.2 oz (64.7 kg) IBW/kg (Calculated) : 50.1  Vital Signs: Temp: 99.7 F (37.6 C) (01/22 0645) Temp Source: Oral (01/22 0645) BP: 122/59 mmHg (01/22 0600) Pulse Rate: 113 (01/22 0600) Intake/Output from previous day: 01/21 0701 - 01/22 0700 In: 4050.8 [I.V.:1248.8; Blood:332.1; NG/GT:230; IV Piggyback:400; QJF:3545] Out: 2400 [Urine:2400]    Labs:  Recent Labs  05/17/15 0500 05/18/15 0520 05/19/15 0456  WBC 17.2* 15.8* 16.2*  HGB 7.9* 7.7* 9.3*  HCT 23.9* 22.9* 28.3*  PLT 596* 594* 584*     Recent Labs  05/17/15 0500 05/18/15 0520 05/19/15 0456  NA 143 146* 143  K 3.6 3.8 3.6  CL 114* 112* 112*  CO2 19* 24 20*  GLUCOSE 156* 150* 126*  BUN 34* 37* 32*  CREATININE 0.93 1.12* 0.99  CALCIUM 10.5* 9.5 9.6  MG 1.7 1.8 1.8  PHOS  --  1.9* 2.5   Estimated Creatinine Clearance: 49.3 mL/min (by C-G formula based on Cr of 0.99).    Recent Labs  05/18/15 2016 05/19/15 0044 05/19/15 0418  GLUCAP 174* 165* 123*    Medical History: Past Medical History  Diagnosis Date  . Olivopontocerebellar atrophy (HCC)     Mgmt Dr Brett Fairy  . Diabetes mellitus     Type II. Insulin dependent. Diabetic nephropathy.    Insulin Requirements:   Lantus: 20 units BID Regular: 90 units/day in TPN Novolog: 22 units resistant SSI last 24hrs  Current Nutrition:  Diet: NPO Tube feeds: Vital 1.5c @ 10 ml/hr (provides 16g protein, 355 kcal)  IVF: 0.45% NaCl with KCl 20 mEq/L @ 75 ml/hr  Central access: PICC per IR TPN start date: 1/9  ASSESSMENT                                                                                                          HPI:  46 YOF presents with N/V and abdominal pain on 1/1, found to have gallstone pancreatitis.  CT revealing  necrotizing pancreatitis. Surgery note form 1/8 states severe abdominal distention and ileus.  Pharmacy asked to start TPN on 1/7 but PICC has not been able to be placed by IV team.  Plans for IR to place PICC. Of note, she has DM on lantus and metformin prior to admission as well as hyperlipidemia on statin therapy.   Significant events:   1/13: NG removed.  Continues on D5W despite glucose d/t chloride elevated.  Discussed with MD and patient needs continued fluid replacement  1/15: US paracentesis  1/16: D5W dc'd and Lantus increased to help control CBGs  1/17: TPN at goal. Added D5W w/ KCl at a low rate to prevent hypoglycemia.  1/19 IVF changed to bicarb with KCl (No other source of K with electrolyte-free TPN.)  1/20 post-pyloric feeding tube placed, begin trickle tube feeds.  1/21 Transfuse 1 unit PRBC for anemia.  Changed NaBicarb fluids to 0.45%  NaCl for hypernatremia.  Stool x3 (mucousy-green diarrhea per RN, negative for Cdiff)  Today, 05/19/2015:   Glucose - CBGs above goal of <150 (Range 123-191) with no hypoglycemia last 24hrs.    Electrolytes - Cl remains slightly elevated but stable.  CO2 low.  Na improved to WNL.  K, Mag, and Phos remain at low end of WNL.  Corr Ca 11.5 (PTH is WNL; Calcitonin given x 2 doses on 1/19-1/20).  Ca x Phos ~29 (Goal < 55)  Renal - SCr decreased, UOP remains high  LFTs - Alk Phos and Tbili trending up (no need to adjust trace elements until Tbili > 6). Albumin 1.6 about the same. (1/19)  TGs - wnl (1/16)  Prealbumin - decreased to 5.3 (1/16)  NUTRITIONAL GOALS                                                                                           RD recs (updated 1/19): 75-85 g/day protein, 1600-1800 Kcal/day  Clinimix 5/15 at a goal rate of 70 ml/hr + 20% fat emulsion at 10 ml/hr to provide: 84 g/day protein, 1672 Kcal/day.  PLAN                                                                                                                          Now:   Magnesium 2g IV x1 dose  KPhos 10 mmol IV x1 dose (also provides 14.5 mEq K+)  At 1800 today:  Cont Clinimix 5/15 at 70 ml/hr (goal rate) - NO electrolytes d/t hypercalcemia  Increase to insulin 100 units/24 hr in TPN (Note for IV stability, max insulin concentration is 130 units/2L bag including overfill amount)  TPN to contain standard multivitamins and trace elements.  Cont 20% fat emulsion at 10 ml/hr.  Cont Lantus 20 units q12h  Maintenance IVF per MD, changed to 0.45% NaCl with KCl 20 mEq/L @ 75 ml/hr  Continue resistant SSI q4h.  TPN lab panels on Mondays & Thursdays.    F/u plans for advancement of tube feeds (cont trickle feeds only for now.)  Gretta Arab PharmD, BCPS Pager [phone] 05/19/2015 7:31 AM

## 2015-05-19 NOTE — Progress Notes (Addendum)
[facility] PROGRESS NOTE  Victoria Morrow ZOX:[id number] DOB: [birth year] DOA: 04/28/2015 PCP: Maryelizabeth Rowan, MD  HPI/Brief narrative 65 year old African-American female with a past medical history of type 2 diabetes on insulin, chronic kidney disease of unknown stage, presented with abdominal pain, nausea and vomiting. She was found to have severe pancreatitis thought to be biliary in origin. She was hospitalized for further management. Critical care medicine was consulted. 1/4 vomited, aspirated, intubated, shock  Required neo since 1/7, transferred care under critical care 1/5 extubated 1/7 SVT requiring cardizem gtt 1/8 Vomiting - NG to LIS 1/12 off cardizem gtt 1/14 care transferred to [facility], with critical care and general surgery following   Assessment/Plan:  Necrotizing pancreatitis -CT scan with evidence of gallstones. Patient denies any history of alcohol intake. Ultrasound of the abdomen demonstrates GB thickening with pericholecystic fluid and 5mm stone lodged in neck of gallbladder and no Murphy's sign. CBD is normal size.  -General surgery is following. No clear evidence for acute cholecystitis.  -T continue on abx, tpn, diflucan added on 1/14, bowel rest. -Patient not able to tolerate MRI of abdomen due to not able to hold breath for longer duration. - -CT abdomen pelvis : extensive pancreatic necrosis, liquefaction of the head, neck and body, large amount of extending into paracolic glutter.  -Dr Derrell Lolling reviewed CT scan, no surgery indication at this time unless patient decompensate. Prior fluid aspiration has remain sterile.  -WBC increase to 16, spiking fevers, plan per surgery . Will check UA, urine culture.  -appreciate surgery help/  -post pyloric NG tube in place.   Fever:  Repeated culture , ua +yeast,  started diflucan,   Ct chest/ab/pel on 1/14:  pancreatic necrosis with poor definition of the head and body the pancreas with increased low-attenuation edema  compared to prior exam. 2. Increased fluid along the pericolic gutters. The are no clear organized pseudocyst at this point. The RIGHT pericolic gutter collection is the most organized.  IR consulted  for fluids collection drain and culture. US guided para was done on 1/15, culture :  No growth to date, final   Spike fever 05-18-2015.  Chest x ray negative for PNA , C diff negative. Might be related to pancreatic necrosis. Defer to surgery further drainage of pancreatic fluids.   Metabolic acidosis.; Resolved with bicarb Gtt,  IV fluids to half NS.  Related to pancreatitis.  Repeat labs in am  Hypercalcemia;  might be related to pancreatitis.  PTH normal , Vitamin D level low.  Continue with IV fluids.  Received  calcitonin two doses.  Awaiting albumin level for correction of  Calcium level.  Calcium at 9.6   Anemia: likely anemia of chronic disease, patient remain sinus tachycardia, tachypenic, transfuse 1prbc on 1/16. S P 1 unit PRBC 1-21 Hb stable at 9.3  Hypomagnesemia:at 1.8. Replete  Septic shock: required intubation and pressors. Now off pressors, extubated.   VDRF; intubated on 1/14 and extubated on 1/15  PAF/SVT:  required cardizem drip, now on lopressor.  Has been in sinus tachycardia since transfer to [facility] 55%-60%, dynamic obstruction reported, grade 1 diastolic dysfunction reported.  increase betablocker iv lopressor from q8hrs to q6hr on 1/17, continue monitor bp and heart rate.  UTI: kleb pneu finished abx treatment  Urinary retention: urology consulted initially during hospitalization due to difficult foley placed placement. She is found to have grade 2 cystocele and urethral stenosis.  ARF on CKD III; better, cr now back to baseline, cr 2.8 on admission,  nadir 1.18 on 1/14, today 1,0. Continue with IV fluids.  Good urine out put at 1.8, mild increase BUN and Cr. Continue with IV fluids. Repeat labs in am. Mild continue  IV fluids to half NS.    Transaminitis: LFTs are slightly improved. Probably related to her necrotizing pancreatitis/sepsis/tpn. Ultrasound of the right upper quadrant as above. Hepatitis panel is negative.  Continue to trend LFTs.  Insulin dependent Diabetes mellitus type 2, poorly controlled, with chronic kidney disease HbA1c is 7.7.  Continue insulin, adjust prn  insulin, TPN Decreased  lantus to 20 units BID 1-18, CBG in the 80. Also getting insulin with TPN.  Adjust insulin as needed. Appreciate Pharmacy assistance.   Code Status: Full Family Communication: Patient, family updated 1-21 Disposition Plan:  remain in stepdown treatment for necrotizing pancreatitis.    Consultants:  TRH-1/4PCCM -1/14TRH  General Surgery  Urology  Procedures:  Intubation /extubation/ng  Central line  tpn  US guided para on 1/15   Antibiotics: Anti-infectives    Start     Dose/Rate Route Frequency Ordered Stop   05/18/15 2200  meropenem (MERREM) 1 g in sodium chloride 0.9 % 100 mL IVPB     1 g 200 mL/hr over 30 Minutes Intravenous Every 12 hours 05/18/15 0859     05/17/15 1800  meropenem (MERREM) 1 g in sodium chloride 0.9 % 100 mL IVPB  Status:  Discontinued     1 g 200 mL/hr over 30 Minutes Intravenous Every 8 hours 05/17/15 1022 05/18/15 0859   05/13/15 1000  fluconazole (DIFLUCAN) IVPB 100 mg     100 mg 50 mL/hr over 60 Minutes Intravenous Every 24 hours 05/12/15 0813 05/16/15 1300   05/12/15 1000  fluconazole (DIFLUCAN) IVPB 200 mg     200 mg 100 mL/hr over 60 Minutes Intravenous  Once 05/12/15 0813 05/12/15 1029   05/08/15 2200  meropenem (MERREM) 1 g in sodium chloride 0.9 % 100 mL IVPB  Status:  Discontinued     1 g 200 mL/hr over 30 Minutes Intravenous Every 12 hours 05/08/15 1054 05/17/15 1022   05/03/15 1200  vancomycin (VANCOCIN) 50 mg/mL oral solution 500 mg  Status:  Discontinued     500 mg Oral 4 times per day 05/03/15 0917 05/04/15 1306   05/02/15 1200  cefTRIAXone (ROCEPHIN) 1 g in  dextrose 5 % 50 mL IVPB  Status:  Discontinued     1 g 100 mL/hr over 30 Minutes Intravenous Every 24 hours 05/01/15 1448 05/01/15 2004   05/02/15 0600  imipenem-cilastatin (PRIMAXIN) 250 mg in sodium chloride 0.9 % 100 mL IVPB  Status:  Discontinued     250 mg 200 mL/hr over 30 Minutes Intravenous 3 times per day 05/01/15 2025 05/02/15 0027   05/02/15 0030  meropenem (MERREM) 500 mg in sodium chloride 0.9 % 50 mL IVPB  Status:  Discontinued     500 mg 100 mL/hr over 30 Minutes Intravenous Every 12 hours 05/02/15 0027 05/08/15 1054   05/01/15 2030  imipenem-cilastatin (PRIMAXIN) 250 mg in sodium chloride 0.9 % 100 mL IVPB     250 mg 200 mL/hr over 30 Minutes Intravenous NOW 05/01/15 2025 05/01/15 2110   04/30/15 1100  metroNIDAZOLE (FLAGYL) IVPB 500 mg  Status:  Discontinued     500 mg 100 mL/hr over 60 Minutes Intravenous Every 12 hours 04/30/15 0951 05/02/15 0751   04/30/15 1000  cefTRIAXone (ROCEPHIN) 1 g in dextrose 5 % 50 mL IVPB  Status:  Discontinued  1 g 100 mL/hr over 30 Minutes Intravenous Every 24 hours 04/30/15 0939 05/01/15 1205   04/30/15 0945  cefTRIAXone (ROCEPHIN) injection 1 g  Status:  Discontinued     1 g Intramuscular Every 24 hours 04/30/15 0930 04/30/15 0938   04/29/15 0200  imipenem-cilastatin (PRIMAXIN) 250 mg in sodium chloride 0.9 % 100 mL IVPB  Status:  Discontinued     250 mg 200 mL/hr over 30 Minutes Intravenous Every 8 hours 04/28/15 1812 04/30/15 0930   04/28/15 1830  imipenem-cilastatin (PRIMAXIN) 250 mg in sodium chloride 0.9 % 100 mL IVPB     250 mg 200 mL/hr over 30 Minutes Intravenous  Once 04/28/15 1812 04/28/15 1904      HPI/Subjective: Denies abdominal pain.  pike fever last night.   Objective: Filed Vitals:   05/19/15 0400 05/19/15 0500 05/19/15 0600 05/19/15 0645  BP: 130/69  122/59   Pulse: 103  113   Temp: 100.1 F (37.8 C)   99.7 F (37.6 C)  TempSrc: Oral   Oral  Resp: 27  25   Height:      Weight:  64.7 kg (142 lb 10.2 oz)     SpO2: 97%  97%     Intake/Output Summary (Last 24 hours) at 05/19/15 0738 Last data filed at 05/19/15 0600  Gross per 24 hour  Intake 4050.83 ml  Output   2400 ml  Net 1650.83 ml   Filed Weights   05/15/15 0500 05/18/15 0500 05/19/15 0500  Weight: 62.5 kg (137 lb 12.6 oz) 61.3 kg (135 lb 2.3 oz) 64.7 kg (142 lb 10.2 oz)    Exam:   General:  Very frail, less tachypnic  Cardiovascular: tachycardic, s1, s2  Respiratory: decreased at basis, no wheezing, no rales, no rhonchi  Abdomen: obese, positive BS, non tender, less distended.   Musculoskeletal: perfused, no clubbing   Data Reviewed: Basic Metabolic Panel:  Recent Labs Lab 05/14/15 0555 05/15/15 0549  05/16/15 0515 05/16/15 1010 05/16/15 1825 05/17/15 0500 05/18/15 0520 05/19/15 0456  NA 143 142  < > 143  --  140 143 146* 143  K 3.6 3.5  < > 3.7  --  3.9 3.6 3.8 3.6  CL 118* 118*  < > 119*  --  113* 114* 112* 112*  CO2 14* 15*  < > 15*  --  16* 19* 24 20*  GLUCOSE 105* 85  < > 169*  --  431* 156* 150* 126*  BUN 34* 35*  < > 37*  --  33* 34* 37* 32*  CREATININE 1.11* 1.16*  < > 1.07*  --  0.93 0.93 1.12* 0.99  CALCIUM 10.6* 11.1*  < > 11.9* 10.5* 10.3 10.5* 9.5 9.6  MG 2.3 1.9  --  1.8  --   --  1.7 1.8 1.8  PHOS 2.7 2.9  --  3.2  --   --   --  1.9* 2.5  < > = values in this interval not displayed. Liver Function Tests:  Recent Labs Lab 05/13/15 0530 05/16/15 0515  AST 56* 58*  ALT 39 48  ALKPHOS 206* 236*  BILITOT 0.8 1.3*  PROT 5.4* 6.3*  ALBUMIN 1.4* 1.6*   No results for input(s): LIPASE, AMYLASE in the last 168 hours. No results for input(s): AMMONIA in the last 168 hours. CBC:  Recent Labs Lab 05/13/15 0530  05/15/15 0549 05/16/15 0515 05/17/15 0500 05/18/15 0520 05/19/15 0456  WBC 22.0*  < > 19.5* 17.0* 17.2* 15.8* 16.2*  NEUTROABS 18.1*  --   --   --   --   --   --  HGB 7.1*  < > 8.5* 8.4* 7.9* 7.7* 9.3*  HCT 21.0*  < > 25.5* 25.5* 23.9* 22.9* 28.3*  MCV 82.4  < > 84.4 85.6  85.4 84.2 86.0  PLT 504*  < > 586* 558* 596* 594* 584*  < > = values in this interval not displayed. Cardiac Enzymes: No results for input(s): CKTOTAL, CKMB, CKMBINDEX, TROPONINI in the last 168 hours. BNP (last 3 results) No results for input(s): BNP in the last 8760 hours.  ProBNP (last 3 results) No results for input(s): PROBNP in the last 8760 hours.  CBG:  Recent Labs Lab 05/18/15 1213 05/18/15 1546 05/18/15 2016 05/19/15 0044 05/19/15 0418  GLUCAP 166* 191* 174* 165* 123*    Recent Results (from the past 240 hour(s))  Culture, blood (routine x 2)     Status: None   Collection Time: 05/11/15 10:43 AM  Result Value Ref Range Status   Specimen Description BLOOD BLOOD LEFT HAND  Final   Special Requests BOTTLES DRAWN AEROBIC ONLY 6CC  Final   Culture   Final    NO GROWTH 5 DAYS Performed at [facility]    Report Status 05/16/2015 FINAL  Final  Culture, blood (routine x 2)     Status: None   Collection Time: 05/11/15 10:50 AM  Result Value Ref Range Status   Specimen Description BLOOD BLOOD LEFT HAND  Final   Special Requests IN PEDIATRIC BOTTLE 3CC  Final   Culture   Final    NO GROWTH 5 DAYS Performed at [facility]    Report Status 05/16/2015 FINAL  Final  Culture, body fluid-bottle     Status: None   Collection Time: 05/12/15 12:43 PM  Result Value Ref Range Status   Specimen Description FLUID PERITONEAL  Final   Special Requests NONE  Final   Culture   Final    NO GROWTH 5 DAYS Performed at [facility]    Report Status 05/17/2015 FINAL  Final  Gram stain     Status: None   Collection Time: 05/12/15 12:43 PM  Result Value Ref Range Status   Specimen Description FLUID PERITONEAL  Final   Special Requests NONE  Final   Gram Stain   Final    FEW WBC PRESENT, PREDOMINANTLY MONONUCLEAR NO ORGANISMS SEEN Performed at [facility]    Report Status 05/12/2015 FINAL  Final  C difficile quick scan w PCR reflex     Status: None    Collection Time: 05/18/15  8:38 AM  Result Value Ref Range Status   C Diff antigen NEGATIVE NEGATIVE Final   C Diff toxin NEGATIVE NEGATIVE Final   C Diff interpretation Negative for toxigenic C. difficile  Final     Studies: Dg Chest Port 1 View  05/18/2015  CLINICAL DATA:  Shortness of breath, weakness and increased temperature. EXAM: PORTABLE CHEST 1 VIEW COMPARISON:  CT of the chest dated 05/11/2015 FINDINGS: Right PICC line is in stable position, tip at the cavoatrial junction. There has been a placement of feeding catheter, tip collimated off the image at the level of the gastric antrum. Cardiomediastinal silhouette is normal. Mediastinal contours appear intact. There are low lung volumes with bibasilar atelectasis. Small subpulmonic effusions also possible. There is no evidence of focal airspace consolidation, pleural effusion or pneumothorax. Osseous structures are without acute abnormality. Soft tissues are grossly normal. IMPRESSION: Low lung volumes with bibasilar atelectasis and likely bilateral small subpulmonic effusions. Placement of feeding catheter, tip collimated off  the image. Electronically Signed   By: Ted Mcalpine M.D.   On: 05/18/2015 10:59   Dg Naso/oro Gtube Thru Duo-reposition  05/17/2015  CLINICAL DATA:  Necrotizing pancreatitis EXAM: POST PYLORIC FEEDING TUBE PLACEMENT UNDER FLUOROSCOPY: TECHNIQUE: Under intermittent fluoroscopic visualization, a nasojejunal feeding tube was advanced from the right nares to the distal duodenum. Small contrast injection confirms appropriate positioning. Tube was secured externally and flushed. No immediate complication. IMPRESSION: Technically successful feeding tube placement to the distal duodenum, ready for post pyloric use. Electronically Signed   By: Corlis Leak M.D.   On: 05/17/2015 13:47    Scheduled Meds: . antiseptic oral rinse  7 mL Mouth Rinse q12n4p  . aspirin  81 mg Oral Daily  . chlorhexidine  15 mL Mouth Rinse BID   . famotidine (PEPCID) IV  20 mg Intravenous Q24H  . heparin  5,000 Units Subcutaneous 3 times per day  . insulin aspart  0-20 Units Subcutaneous 6 times per day  . insulin glargine  20 Units Subcutaneous BID  . meropenem (MERREM) IV  1 g Intravenous Q12H  . metoprolol  5 mg Intravenous 4 times per day  . sodium chloride  10-40 mL Intracatheter Q12H  . sodium chloride  3 mL Intravenous Q12H   Continuous Infusions: . 0.45 % NaCl with KCl 20 mEq / L 75 mL/hr at 05/19/15 0333  . TPN (CLINIMIX) Adult without lytes 70 mL/hr at 05/18/15 1831   And  . fat emulsion 240 mL (05/18/15 1831)  . feeding supplement (VITAL 1.5 CAL) 1,000 mL (05/19/15 0558)    Principal Problem:   Acute necrotizing pancreatitis with SIRS Active Problems:   Gallstones   Transaminitis   DM type 2 (diabetes mellitus, type 2) (HCC)   Hyperlipidemia   ARF (acute renal failure) (HCC)   Acute respiratory failure with hypoxia (HCC)   Aspiration pneumonia due to vomit (HCC)   Acute on chronic respiratory failure (HCC)   Protein-calorie malnutrition, moderate (HCC)   Umbilical hernia   Chronic kidney disease (CKD), stage III (moderate)   Olivopontocerebellar atrophy (HCC)   Abscess   Fever   Necrotizing pancreatitis     Hartley Barefoot A MD  [facility] Pager [phone] If 7PM-7AM, please contact night-coverage at [email or web address], password [facility] 05/19/2015, 7:38 AM  LOS: 21 days

## 2015-05-19 NOTE — Progress Notes (Signed)
CSW referred to assist this patient with SNF placement. CSW discussed SNF option; husband Shanon Brow, Daughter Elmyra Ricks and son-in-law Jeneen Rinks met with CSW this afternoon. Patient wants to return home and receive home health services. Family states that they have a great deal of support at home and there is a family member who can actually "stay" in the home with patient and husband for as long as needed for supportive care.  They do not have a preference for home health agencies; patient has [facility].  CSW will notify RNCM on Monday of above.  No further CSW needs identified at this time. CSW services will sign off. Lorie Phenix. [place], [place]  (weekend coverage)

## 2015-05-20 ENCOUNTER — Inpatient Hospital Stay (HOSPITAL_COMMUNITY): Payer: Commercial Managed Care - HMO

## 2015-05-20 DIAGNOSIS — R609 Edema, unspecified: Secondary | ICD-10-CM

## 2015-05-20 DIAGNOSIS — R0600 Dyspnea, unspecified: Secondary | ICD-10-CM

## 2015-05-20 DIAGNOSIS — R0603 Acute respiratory distress: Secondary | ICD-10-CM

## 2015-05-20 LAB — COMPREHENSIVE METABOLIC PANEL
ALK PHOS: 294 U/L — AB (ref 38–126)
ALT: 45 U/L (ref 14–54)
ANION GAP: 10 (ref 5–15)
AST: 42 U/L — ABNORMAL HIGH (ref 15–41)
Albumin: 1.6 g/dL — ABNORMAL LOW (ref 3.5–5.0)
BILIRUBIN TOTAL: 0.9 mg/dL (ref 0.3–1.2)
BUN: 32 mg/dL — ABNORMAL HIGH (ref 6–20)
CALCIUM: 10.3 mg/dL (ref 8.9–10.3)
CO2: 18 mmol/L — AB (ref 22–32)
CREATININE: 0.98 mg/dL (ref 0.44–1.00)
Chloride: 113 mmol/L — ABNORMAL HIGH (ref 101–111)
GFR, EST NON AFRICAN AMERICAN: 59 mL/min — AB (ref >60)
Glucose, Bld: 111 mg/dL — ABNORMAL HIGH (ref 65–99)
Potassium: 3.7 mmol/L (ref 3.5–5.1)
Sodium: 141 mmol/L (ref 135–145)
TOTAL PROTEIN: 6 g/dL — AB (ref 6.5–8.1)

## 2015-05-20 LAB — GLUCOSE, CAPILLARY
GLUCOSE-CAPILLARY: 116 mg/dL — AB (ref 65–99)
GLUCOSE-CAPILLARY: 122 mg/dL — AB (ref 65–99)
GLUCOSE-CAPILLARY: 124 mg/dL — AB (ref 65–99)
Glucose-Capillary: 128 mg/dL — ABNORMAL HIGH (ref 65–99)
Glucose-Capillary: 150 mg/dL — ABNORMAL HIGH (ref 65–99)
Glucose-Capillary: 133 mg/dL — ABNORMAL HIGH (ref 65–99)

## 2015-05-20 LAB — BLOOD GAS, ARTERIAL
ACID-BASE DEFICIT: 6.4 mmol/L — AB (ref 0.0–2.0)
Bicarbonate: 15.8 mEq/L — ABNORMAL LOW (ref 20.0–24.0)
DRAWN BY: 441261
FIO2: 0.21
O2 SAT: 94.9 %
PCO2 ART: 22.9 mmHg — AB (ref 35.0–45.0)
PO2 ART: 78.8 mmHg — AB (ref 80.0–100.0)
Patient temperature: 99
TCO2: 14.4 mmol/L (ref 0–100)
pH, Arterial: 7.454 — ABNORMAL HIGH (ref 7.350–7.450)

## 2015-05-20 LAB — TYPE AND SCREEN
ABO/RH(D): B POS
Antibody Screen: NEGATIVE
UNIT DIVISION: 0

## 2015-05-20 LAB — CBC WITH DIFFERENTIAL/PLATELET
BASOS ABS: 0 10*3/uL (ref 0.0–0.1)
Basophils Relative: 0 %
EOS ABS: 0.3 10*3/uL (ref 0.0–0.7)
Eosinophils Relative: 2 %
HCT: 27.5 % — ABNORMAL LOW (ref 36.0–46.0)
HEMOGLOBIN: 9.1 g/dL — AB (ref 12.0–15.0)
LYMPHS PCT: 10 %
Lymphs Abs: 1.6 10*3/uL (ref 0.7–4.0)
MCH: 28.3 pg (ref 26.0–34.0)
MCHC: 33.1 g/dL (ref 30.0–36.0)
MCV: 85.7 fL (ref 78.0–100.0)
MONO ABS: 1.1 10*3/uL — AB (ref 0.1–1.0)
Monocytes Relative: 7 %
NEUTROS ABS: 13.3 10*3/uL — AB (ref 1.7–7.7)
NEUTROS PCT: 81 %
Platelets: 513 10*3/uL — ABNORMAL HIGH (ref 150–400)
RBC: 3.21 MIL/uL — ABNORMAL LOW (ref 3.87–5.11)
RDW: 14.3 % (ref 11.5–15.5)
WBC: 16.3 10*3/uL — ABNORMAL HIGH (ref 4.0–10.5)

## 2015-05-20 LAB — TRIGLYCERIDES: Triglycerides: 141 mg/dL (ref <150)

## 2015-05-20 LAB — URINE CULTURE: CULTURE: NO GROWTH

## 2015-05-20 LAB — MAGNESIUM: MAGNESIUM: 2 mg/dL (ref 1.7–2.4)

## 2015-05-20 LAB — PREALBUMIN: Prealbumin: 10.1 mg/dL — ABNORMAL LOW (ref 18–38)

## 2015-05-20 LAB — PHOSPHORUS: PHOSPHORUS: 3.9 mg/dL (ref 2.5–4.6)

## 2015-05-20 LAB — CORTISOL: CORTISOL PLASMA: 19.7 ug/dL

## 2015-05-20 MED ORDER — FAT EMULSION 20 % IV EMUL
240.0000 mL | INTRAVENOUS | Status: AC
  Administered 2015-05-20: 240 mL via INTRAVENOUS
  Filled 2015-05-20: qty 250

## 2015-05-20 MED ORDER — CALCITONIN (SALMON) 200 UNIT/ML IJ SOLN
100.0000 [IU] | Freq: Every day | INTRAMUSCULAR | Status: AC
  Administered 2015-05-20 – 2015-05-21 (×2): 100 [IU] via INTRAMUSCULAR
  Filled 2015-05-20 (×3): qty 0.5

## 2015-05-20 MED ORDER — TRACE MINERALS CR-CU-MN-SE-ZN 10-1000-500-60 MCG/ML IV SOLN
INTRAVENOUS | Status: AC
  Administered 2015-05-20: 18:00:00 via INTRAVENOUS
  Filled 2015-05-20: qty 1680

## 2015-05-20 MED ORDER — SODIUM BICARBONATE 8.4 % IV SOLN
INTRAVENOUS | Status: DC
  Administered 2015-05-20 – 2015-05-23 (×4): via INTRAVENOUS
  Filled 2015-05-20 (×5): qty 150

## 2015-05-20 NOTE — Progress Notes (Signed)
PARENTERAL NUTRITION CONSULT NOTE - FOLLOW-UP  Pharmacy Consult for TPN Indication: severe necrotizing pancreatitis with ileus  No Known Allergies  Patient Measurements: Height: 5' 2"  (157.5 cm) Weight: 142 lb 10.2 oz (64.7 kg) IBW/kg (Calculated) : 50.1  Vital Signs: Temp: 99.4 F (37.4 C) (01/23 0800) Temp Source: Oral (01/23 0800) BP: 141/79 mmHg (01/23 0800) Pulse Rate: 107 (01/23 0800) Intake/Output from previous day: 01/22 0701 - 01/23 0700 In: 4130 [I.V.:1650; NG/GT:220; IV Piggyback:500; TPN:1760] Out: 3150 [Urine:3150]    Labs:  Recent Labs  05/18/15 0520 05/19/15 0456 05/20/15 0500  WBC 15.8* 16.2* 16.3*  HGB 7.7* 9.3* 9.1*  HCT 22.9* 28.3* 27.5*  PLT 594* 584* 513*     Recent Labs  05/18/15 0520 05/19/15 0456 05/19/15 0930 05/20/15 0500  NA 146* 143  --  141  K 3.8 3.6  --  3.7  CL 112* 112*  --  113*  CO2 24 20*  --  18*  GLUCOSE 150* 126*  --  111*  BUN 37* 32*  --  32*  CREATININE 1.12* 0.99  --  0.98  CALCIUM 9.5 9.6  --  10.3  MG 1.8 1.8  --  2.0  PHOS 1.9* 2.5  --  3.9  PROT  --   --   --  6.0*  ALBUMIN  --   --  1.6* 1.6*  AST  --   --   --  42*  ALT  --   --   --  45  ALKPHOS  --   --   --  294*  BILITOT  --   --   --  0.9   Estimated Creatinine Clearance: 49.8 mL/min (by C-G formula based on Cr of 0.98).    Recent Labs  05/19/15 2327 05/20/15 0321 05/20/15 0808  GLUCAP 116* 128* 122*    Medical History: Past Medical History  Diagnosis Date  . Olivopontocerebellar atrophy (HCC)     Mgmt Dr Brett Fairy  . Diabetes mellitus     Type II. Insulin dependent. Diabetic nephropathy.    Insulin Requirements:   Lantus: 20 units BID Regular: 100 units/day in TPN Novolog: 20 units resistant SSI last 24hrs  Current Nutrition:  Diet: NPO Tube feeds: Vital 1.5c @ 10 ml/hr (provides 16g protein, 355 kcal)  IVF: 0.45% NaCl with KCl 20 mEq/L @ 75 ml/hr  Central access: PICC per IR TPN start date: 1/9  ASSESSMENT                                                                                                           HPI:  50 YOF presents with N/V and abdominal pain on 1/1, found to have gallstone pancreatitis.  CT revealing necrotizing pancreatitis. Surgery note form 1/8 states severe abdominal distention and ileus.  Pharmacy asked to start TPN on 1/7 but PICC has not been able to be placed by IV team.  Plans for IR to place PICC. Of note, she has DM on lantus and metformin prior to admission as well as hyperlipidemia on statin therapy.  Significant events:   1/13: NG removed.  Continues on D5W despite glucose d/t chloride elevated.  Discussed with MD and patient needs continued fluid replacement  1/15: US paracentesis  1/16: D5W dc'd and Lantus increased to help control CBGs  1/17: TPN at goal. Added D5W w/ KCl at a low rate to prevent hypoglycemia.  1/19 IVF changed to bicarb with KCl (No other source of K with electrolyte-free TPN.)  1/20 post-pyloric feeding tube placed, begin trickle tube feeds.  1/21 Transfuse 1 unit PRBC for anemia.  Changed NaBicarb fluids to 0.45% NaCl for hypernatremia.  Stool x3 (mucousy-green diarrhea per RN, negative for Cdiff)  1/23 Spiking fevers, tachypnic - CXR and ABG pending, CCM to see  Today, 05/20/2015:   Glucose - CBGs now at goal of <150 (Range 116-147) with no hypoglycemia last 24hrs.    Electrolytes - Cl remains slightly elevated but stable.  CO2 low.  Off bicarb drip. Na improved to WNL.  K, Mag, and Phos WNL.  Corr Ca 12.2 (PTH is WNL; Calcitonin given x 2 doses on 1/19-1/20, repeat 1/23-1/24).  Ca x Phos ~47 (Goal < 55)  Renal - SCr improved, UOP remains adequate  LFTs - Alk Phos trending up, AST/ALT and Tbili improving.  Albumin low/stable at 1.6   TGs - wnl (1/16), pending this am  Prealbumin - decreased to 5.3 (1/16), pending this am  NUTRITIONAL GOALS                                                                                           RD recs  (updated 1/19): 75-85 g/day protein, 1600-1800 Kcal/day  Clinimix 5/15 at a goal rate of 70 ml/hr + 20% fat emulsion at 10 ml/hr to provide: 84 g/day protein, 1672 Kcal/day.  PLAN                                                                                                                          At 1800 today:  Cont Clinimix 5/15 at 70 ml/hr (goal rate) - NO electrolytes d/t hypercalcemia  Decrease insulin back down to 90 units/24 hr in TPN as CBGs have trended down quickly and would like to avoid hypoglycemia  TPN to contain standard multivitamins and trace elements.  Cont 20% fat emulsion at 10 ml/hr.  Cont Lantus 20 units q12h  Maintenance IVF per MD  Continue resistant SSI q4h.  TPN lab panels on Mondays & Thursdays.    F/u plans for advancement of tube feeds (cont trickle feeds only for now.)  Peggyann Juba, PharmD, BCPS Pager: [phone]  05/20/2015 8:29 AM

## 2015-05-20 NOTE — Progress Notes (Signed)
Date: May 20, 2015 Chart reviewed for concurrent status and case management needs. Will continue to follow patient for changes and needs:  Remains in sdu, o2 at 4l/ abgs abnormal Marcelle Smiling, BSN, CCM, [place],   [phone]

## 2015-05-20 NOTE — Progress Notes (Signed)
Nutrition Follow-up  INTERVENTION:   TPN per Pharmacy Continue Vital 1.5 @ 10 ml/hr. Recommend advancing to Vital 1.5 to 20 ml/hr 1/24 if tolerating. RD to follow-up for advancement  NUTRITION DIAGNOSIS:   Inadequate oral intake related to inability to eat as evidenced by NPO status.  Ongoing.  GOAL:   Patient will meet greater than or equal to 90% of their needs  Meeting.  MONITOR:   Labs, Weight trends, TF tolerance, Skin, I & O's, Other (Comment) (TPN)  REASON FOR ASSESSMENT:   Consult Enteral/tube feeding initiation and management  ASSESSMENT:   Victoria Morrow is a 65 y.o. female with a past medical history of type 2 diabetes on insulin, chronic kidney disease of unknown stage, hyperlipidemia, who was in her usual state of health till earlier today when she started developing nausea followed by vomiting. She also started having upper abdominal pain. Symptoms got worse and she decided to come into the hospital for further evaluation. She denies having had similar symptoms in the past. Pain is located in the upper abdomen without any radiation. It's achy pain 10 out of 10 in intensity. No aggravating or relieving factors. No known precipitant factors. Denies any recent antibiotic use. No sick contacts. No recent hospitalization. Denies any fever but has had chills. Denies any blood in the emesis or in the stool.  Pt currently receiving TPN at goal of 70 ml/hr and lipid infusion at 10 ml/hr. Tube feedings of Vital 1.5 have been infusing at 10 ml/hr over the weekend. This provides 2033 kcal and 100g of protein. Pt tolerating with no issues.  Recommend Vital 1.5 to advance to 20 ml/hr on 1/24. Recommend decreasing TPN if TF is to be advanced as this will provide > 110% of estimated needs.   Plan per Pharmacy 1/23: At 1800 today:  Cont Clinimix 5/15 at 70 ml/hr (goal rate) - NO electrolytes d/t hypercalcemia  Decrease insulin back down to 90 units/24 hr in TPN as CBGs have  trended down quickly and would like to avoid hypoglycemia  TPN to contain standard multivitamins and trace elements.  Cont 20% fat emulsion at 10 ml/hr.  Labs reviewed.  Diet Order:  Diet NPO time specified Except for: Ice Chips TPN [facility]) Adult without lytes TPN (CLINIMIX) Adult without lytes  Skin:  Reviewed, no issues  Last BM:  1/22  Height:   Ht Readings from Last 1 Encounters:  05/01/15  (1.575 m)    Weight:   Wt Readings from Last 1 Encounters:  05/19/15 142 lb 10.2 oz (64.7 kg)    Ideal Body Weight:  50 kg  BMI:  Body mass index is 26.08 kg/(m^2).  Estimated Nutritional Needs:   Kcal:  1600-1800  Protein:  75-85g  Fluid:  1.8 - 2L/day  EDUCATION NEEDS:   No education needs identified at this time  Tilda Franco, MS, RD, LDN Pager: [phone] After Hours Pager: [phone]

## 2015-05-20 NOTE — Progress Notes (Signed)
Patient ID: Victoria Morrow, female   DOB: 1950/05/29, 65 y.o.   MRN: [id number]  General Surgery [facility]  Subjective: Patient in ICU, awake, responsive.  Denies pain.  Denies nausea.  TF's at 10cc/hr.  Objective: Vital signs in last 24 hours: Temp:  [98.2 F (36.8 C)-102.1 F (38.9 C)] 99.4 F (37.4 C) (01/23 0800) Pulse Rate:  [90-140] 107 (01/23 0800) Resp:  [22-37] 34 (01/23 0800) BP: (108-147)/(59-82) 141/79 mmHg (01/23 0800) SpO2:  [95 %-100 %] 98 % (01/23 0800) Last BM Date: 05/19/15  Intake/Output from previous day: 01/22 0701 - 01/23 0700 In: 4130 [I.V.:1650; NG/GT:220; IV Piggyback:500; TPN:1760] Out: 3150 [Urine:3150] Intake/Output this shift:    Physical Exam: HEENT - sclerae clear, mucous membranes moist Neck - soft Chest - coarse bilaterally Cor - tachycardic Abdomen - moderate distension; quiet; tense across upper abdomen, non-tender Ext - no edema, non-tender Neuro - alert & oriented, no focal deficits  Lab Results:   Recent Labs  05/19/15 0456 05/20/15 0500  WBC 16.2* 16.3*  HGB 9.3* 9.1*  HCT 28.3* 27.5*  PLT 584* 513*   BMET  Recent Labs  05/19/15 0456 05/20/15 0500  NA 143 141  K 3.6 3.7  CL 112* 113*  CO2 20* 18*  GLUCOSE 126* 111*  BUN 32* 32*  CREATININE 0.99 0.98  CALCIUM 9.6 10.3   PT/INR No results for input(s): LABPROT, INR in the last 72 hours. Comprehensive Metabolic Panel:    Component Value Date/Time   NA 141 05/20/2015 0500   NA 143 05/19/2015 0456   K 3.7 05/20/2015 0500   K 3.6 05/19/2015 0456   CL 113* 05/20/2015 0500   CL 112* 05/19/2015 0456   CO2 18* 05/20/2015 0500   CO2 20* 05/19/2015 0456   BUN 32* 05/20/2015 0500   BUN 32* 05/19/2015 0456   CREATININE 0.98 05/20/2015 0500   CREATININE 0.99 05/19/2015 0456   GLUCOSE 111* 05/20/2015 0500   GLUCOSE 126* 05/19/2015 0456   CALCIUM 10.3 05/20/2015 0500   CALCIUM 9.6 05/19/2015 0456   CALCIUM 10.5* 05/16/2015 1010   AST 42*  05/20/2015 0500   AST 58* 05/16/2015 0515   ALT 45 05/20/2015 0500   ALT 48 05/16/2015 0515   ALKPHOS 294* 05/20/2015 0500   ALKPHOS 236* 05/16/2015 0515   BILITOT 0.9 05/20/2015 0500   BILITOT 1.3* 05/16/2015 0515   PROT 6.0* 05/20/2015 0500   PROT 6.3* 05/16/2015 0515   ALBUMIN 1.6* 05/20/2015 0500   ALBUMIN 1.6* 05/19/2015 0930    Studies/Results: Dg Chest Port 1 View  05/18/2015  CLINICAL DATA:  Shortness of breath, weakness and increased temperature. EXAM: PORTABLE CHEST 1 VIEW COMPARISON:  CT of the chest dated 05/11/2015 FINDINGS: Right PICC line is in stable position, tip at the cavoatrial junction. There has been a placement of feeding catheter, tip collimated off the image at the level of the gastric antrum. Cardiomediastinal silhouette is normal. Mediastinal contours appear intact. There are low lung volumes with bibasilar atelectasis. Small subpulmonic effusions also possible. There is no evidence of focal airspace consolidation, pleural effusion or pneumothorax. Osseous structures are without acute abnormality. Soft tissues are grossly normal. IMPRESSION: Low lung volumes with bibasilar atelectasis and likely bilateral small subpulmonic effusions. Placement of feeding catheter, tip collimated off the image. Electronically Signed   By: Ted Mcalpine M.D.   On: 05/18/2015 10:59    Anti-infectives: Anti-infectives    Start     Dose/Rate Route Frequency Ordered Stop  05/18/15 2200  meropenem (MERREM) 1 g in sodium chloride 0.9 % 100 mL IVPB     1 g 200 mL/hr over 30 Minutes Intravenous Every 12 hours 05/18/15 0859     05/17/15 1800  meropenem (MERREM) 1 g in sodium chloride 0.9 % 100 mL IVPB  Status:  Discontinued     1 g 200 mL/hr over 30 Minutes Intravenous Every 8 hours 05/17/15 1022 05/18/15 0859   05/13/15 1000  fluconazole (DIFLUCAN) IVPB 100 mg     100 mg 50 mL/hr over 60 Minutes Intravenous Every 24 hours 05/12/15 0813 05/16/15 1300   05/12/15 1000  fluconazole  (DIFLUCAN) IVPB 200 mg     200 mg 100 mL/hr over 60 Minutes Intravenous  Once 05/12/15 0813 05/12/15 1029   05/08/15 2200  meropenem (MERREM) 1 g in sodium chloride 0.9 % 100 mL IVPB  Status:  Discontinued     1 g 200 mL/hr over 30 Minutes Intravenous Every 12 hours 05/08/15 1054 05/17/15 1022   05/03/15 1200  vancomycin (VANCOCIN) 50 mg/mL oral solution 500 mg  Status:  Discontinued     500 mg Oral 4 times per day 05/03/15 0917 05/04/15 1306   05/02/15 1200  cefTRIAXone (ROCEPHIN) 1 g in dextrose 5 % 50 mL IVPB  Status:  Discontinued     1 g 100 mL/hr over 30 Minutes Intravenous Every 24 hours 05/01/15 1448 05/01/15 2004   05/02/15 0600  imipenem-cilastatin (PRIMAXIN) 250 mg in sodium chloride 0.9 % 100 mL IVPB  Status:  Discontinued     250 mg 200 mL/hr over 30 Minutes Intravenous 3 times per day 05/01/15 2025 05/02/15 0027   05/02/15 0030  meropenem (MERREM) 500 mg in sodium chloride 0.9 % 50 mL IVPB  Status:  Discontinued     500 mg 100 mL/hr over 30 Minutes Intravenous Every 12 hours 05/02/15 0027 05/08/15 1054   05/01/15 2030  imipenem-cilastatin (PRIMAXIN) 250 mg in sodium chloride 0.9 % 100 mL IVPB     250 mg 200 mL/hr over 30 Minutes Intravenous NOW 05/01/15 2025 05/01/15 2110   04/30/15 1100  metroNIDAZOLE (FLAGYL) IVPB 500 mg  Status:  Discontinued     500 mg 100 mL/hr over 60 Minutes Intravenous Every 12 hours 04/30/15 0951 05/02/15 0751   04/30/15 1000  cefTRIAXone (ROCEPHIN) 1 g in dextrose 5 % 50 mL IVPB  Status:  Discontinued     1 g 100 mL/hr over 30 Minutes Intravenous Every 24 hours 04/30/15 0939 05/01/15 1205   04/30/15 0945  cefTRIAXone (ROCEPHIN) injection 1 g  Status:  Discontinued     1 g Intramuscular Every 24 hours 04/30/15 0930 04/30/15 0938   04/29/15 0200  imipenem-cilastatin (PRIMAXIN) 250 mg in sodium chloride 0.9 % 100 mL IVPB  Status:  Discontinued     250 mg 200 mL/hr over 30 Minutes Intravenous Every 8 hours 04/28/15 1812 04/30/15 0930   04/28/15 1830   imipenem-cilastatin (PRIMAXIN) 250 mg in sodium chloride 0.9 % 100 mL IVPB     250 mg 200 mL/hr over 30 Minutes Intravenous  Once 04/28/15 1812 04/28/15 1904      Assessment & Plans: Acute biliary pancreatitis with necrosis, cholelithiasis  Continue TF's as tolerated  TNA, NPO  Empiric abx per medical service  Reviewed CT scan from 1/19 - may need to repeat next 1-2 days  WBC stable at 16K DM CKD Respiratory failure  CCM consultation today per [facility]  Will follow closely with you.  Huey Bienenstock.  Gerrit Friends, MD, [facility] Office: [phone]   Kaenan Jake Judie Petit 05/20/2015

## 2015-05-20 NOTE — Progress Notes (Signed)
[facility] PROGRESS NOTE  Victoria Morrow AST:[id number] DOB: [birth year] DOA: 04/28/2015 PCP: Rachell Cipro, MD  HPI/Brief narrative 65 year old African-American female with a past medical history of type 2 diabetes on insulin, chronic kidney disease of unknown stage, presented with abdominal pain, nausea and vomiting. She was found to have severe pancreatitis thought to be biliary in origin. She was hospitalized for further management. Critical care medicine was consulted. 1/4 vomited, aspirated, intubated, shock  Required neo since 1/7, transferred care under critical care 1/5 extubated 1/7 SVT requiring cardizem gtt 1/8 Vomiting - NG to LIS 1/12 off cardizem gtt 1/14 care transferred to [facility], with critical care and general surgery following   Assessment/Plan:  Necrotizing pancreatitis -CT scan with evidence of gallstones. Patient denies any history of alcohol intake. Ultrasound of the abdomen demonstrates GB thickening with pericholecystic fluid and 71m stone lodged in neck of gallbladder and no Murphy's sign. CBD is normal size.  -General surgery is following. No clear evidence for acute cholecystitis.  -T continue on abx, tpn, diflucan added on 1/14, bowel rest. -Patient not able to tolerate MRI of abdomen due to not able to hold breath for longer duration. - -CT abdomen pelvis : extensive pancreatic necrosis, liquefaction of the head, neck and body, large amount of extending into paracolic glutter.  -Dr IDalbert Batmanreviewed CT scan, no surgery indication at this time unless patient decompensate. Prior fluid aspiration has remain sterile.  -WBC at  16, spiking fevers, plan per surgery . UA negative, urine culture await re-culture.  -appreciate surgery help/  -post pyloric NG tube in place.  Patient continue to spike fevers, her RR this am has been in 40, she is using abdomen to breath.  I will check Chest X ray, and Stat ABG. I will consult CMM.   Fever:  Repeated culture , ua  +yeast,  started diflucan,   Ct chest/ab/pel on 1/14:  pancreatic necrosis with poor definition of the head and body the pancreas with increased low-attenuation edema compared to prior exam. 2. Increased fluid along the pericolic gutters. The are no clear organized pseudocyst at this point. The RIGHT pericolic gutter collection is the most organized.  IR consulted  for fluids collection drain and culture. UKoreaguided para was done on 1/15, culture :  No growth to date, final   Continue to Spike fever 05-18-2015.  Chest x ray negative for PNA , C diff negative. Might be related to pancreatic necrosis. Defer to surgery further drainage of pancreatic fluids.   Metabolic acidosis.; Resolved with bicarb Gtt,  IV fluids to half NS.  Related to pancreatitis.  If bicarb continue to decreases will resume bicarb Gtt   Hypercalcemia;  might be related to pancreatitis.  PTH normal , Vitamin D level low.  Continue with IV fluids.  Awaiting albumin level for correction of  Calcium level.  Calcium at 10.3 corrected by albumin at 12. Will repeat dose of calcitonin   Anemia: likely anemia of chronic disease, patient remain sinus tachycardia, tachypenic, transfuse 1prbc on 1/16. S P 1 unit PRBC 1-21 Hb stable at 9.3  Hypomagnesemia replaced, today at 2.   Septic shock: required intubation and pressors. Now off pressors, extubated.   VDRF; intubated on 1/14 and extubated on 1/15  PAF/SVT:  required cardizem drip, now on lopressor.  [facility] dynamic obstruction reported, grade 1 diastolic dysfunction reported.  increase betablocker iv lopressor from q8hrs to q6hr on 1/17, continue monitor bp  and heart rate.  UTI: kleb pneu finished abx treatment  Urinary retention: urology consulted initially during hospitalization due to difficult foley placed placement. She is found to have grade 2 cystocele and urethral stenosis.  ARF on CKD III; better,  cr now back to baseline, cr 2.8 on admission, nadir 1.18 on 1/14, today 1,0. Continue with IV fluids.  Good urine out put at 1.8, mild increase BUN and Cr. Continue with IV fluids. Repeat labs in am. Mild continue  IV fluids to half NS.   Transaminitis: LFTs are slightly improved. Probably related to her necrotizing pancreatitis/sepsis/tpn. Ultrasound of the right upper quadrant as above. Hepatitis panel is negative.  Continue to trend LFTs. Alk phosphatase still elevated.   Insulin dependent Diabetes mellitus type 2, poorly controlled, with chronic kidney disease HbA1c is 7.7.  Continue insulin, adjust prn  insulin, TPN Decreased  lantus to 20 units BID 1-18, CBG in the 80. Also getting insulin with TPN.  Adjust insulin as needed. Appreciate Pharmacy assistance.   Code Status: Full Family Communication: Patient, family updated 1-21 Disposition Plan:  remain in stepdown treatment for necrotizing pancreatitis.    Consultants:  TRH-1/4PCCM -1/14TRH  General Surgery  Urology  Procedures:  Intubation /extubation/ng  Central line  tpn  US guided para on 1/15   Antibiotics: Anti-infectives    Start     Dose/Rate Route Frequency Ordered Stop   05/18/15 2200  meropenem (MERREM) 1 g in sodium chloride 0.9 % 100 mL IVPB     1 g 200 mL/hr over 30 Minutes Intravenous Every 12 hours 05/18/15 0859     05/17/15 1800  meropenem (MERREM) 1 g in sodium chloride 0.9 % 100 mL IVPB  Status:  Discontinued     1 g 200 mL/hr over 30 Minutes Intravenous Every 8 hours 05/17/15 1022 05/18/15 0859   05/13/15 1000  fluconazole (DIFLUCAN) IVPB 100 mg     100 mg 50 mL/hr over 60 Minutes Intravenous Every 24 hours 05/12/15 0813 05/16/15 1300   05/12/15 1000  fluconazole (DIFLUCAN) IVPB 200 mg     200 mg 100 mL/hr over 60 Minutes Intravenous  Once 05/12/15 0813 05/12/15 1029   05/08/15 2200  meropenem (MERREM) 1 g in sodium chloride 0.9 % 100 mL IVPB  Status:  Discontinued     1 g 200 mL/hr  over 30 Minutes Intravenous Every 12 hours 05/08/15 1054 05/17/15 1022   05/03/15 1200  vancomycin (VANCOCIN) 50 mg/mL oral solution 500 mg  Status:  Discontinued     500 mg Oral 4 times per day 05/03/15 0917 05/04/15 1306   05/02/15 1200  cefTRIAXone (ROCEPHIN) 1 g in dextrose 5 % 50 mL IVPB  Status:  Discontinued     1 g 100 mL/hr over 30 Minutes Intravenous Every 24 hours 05/01/15 1448 05/01/15 2004   05/02/15 0600  imipenem-cilastatin (PRIMAXIN) 250 mg in sodium chloride 0.9 % 100 mL IVPB  Status:  Discontinued     250 mg 200 mL/hr over 30 Minutes Intravenous 3 times per day 05/01/15 2025 05/02/15 0027   05/02/15 0030  meropenem (MERREM) 500 mg in sodium chloride 0.9 % 50 mL IVPB  Status:  Discontinued     500 mg 100 mL/hr over 30 Minutes Intravenous Every 12 hours 05/02/15 0027 05/08/15 1054   05/01/15 2030  imipenem-cilastatin (PRIMAXIN) 250 mg in sodium chloride 0.9 % 100 mL IVPB     250 mg 200 mL/hr over 30 Minutes Intravenous NOW 05/01/15 2025 05/01/15 2110  04/30/15 1100  metroNIDAZOLE (FLAGYL) IVPB 500 mg  Status:  Discontinued     500 mg 100 mL/hr over 60 Minutes Intravenous Every 12 hours 04/30/15 0951 05/02/15 0751   04/30/15 1000  cefTRIAXone (ROCEPHIN) 1 g in dextrose 5 % 50 mL IVPB  Status:  Discontinued     1 g 100 mL/hr over 30 Minutes Intravenous Every 24 hours 04/30/15 0939 05/01/15 1205   04/30/15 0945  cefTRIAXone (ROCEPHIN) injection 1 g  Status:  Discontinued     1 g Intramuscular Every 24 hours 04/30/15 0930 04/30/15 0938   04/29/15 0200  imipenem-cilastatin (PRIMAXIN) 250 mg in sodium chloride 0.9 % 100 mL IVPB  Status:  Discontinued     250 mg 200 mL/hr over 30 Minutes Intravenous Every 8 hours 04/28/15 1812 04/30/15 0930   04/28/15 1830  imipenem-cilastatin (PRIMAXIN) 250 mg in sodium chloride 0.9 % 100 mL IVPB     250 mg 200 mL/hr over 30 Minutes Intravenous  Once 04/28/15 1812 04/28/15 1904      HPI/Subjective: She has flat affect, denies abdominal  pain, feeling ok, less responsive and interactive.  more tachypnea , RR 40...  Objective: Filed Vitals:   05/20/15 0200 05/20/15 0346 05/20/15 0400 05/20/15 0600  BP: 108/63  122/72 128/67  Pulse: 101  100 90  Temp:  99.2 F (37.3 C)    TempSrc:  Oral    Resp: 24  24 28   Height:      Weight:      SpO2: 98%  98% 100%    Intake/Output Summary (Last 24 hours) at 05/20/15 0750 Last data filed at 05/20/15 0500  Gross per 24 hour  Intake   4130 ml  Output   3150 ml  Net    980 ml   Filed Weights   05/15/15 0500 05/18/15 0500 05/19/15 0500  Weight: 62.5 kg (137 lb 12.6 oz) 61.3 kg (135 lb 2.3 oz) 64.7 kg (142 lb 10.2 oz)    Exam:   General:  Very frail, flat affect, more tacypnea  Cardiovascular: tachycardic, s1, s2  Respiratory: decreased at basis, no wheezing, no rales, no rhonchi  Abdomen: obese, positive BS, non tender, distended.   Musculoskeletal: perfused, no clubbing   Data Reviewed: Basic Metabolic Panel:  Recent Labs Lab 05/15/15 0549  05/16/15 0515  05/16/15 1825 05/17/15 0500 05/18/15 0520 05/19/15 0456 05/20/15 0500  NA 142  < > 143  --  140 143 146* 143 141  K 3.5  < > 3.7  --  3.9 3.6 3.8 3.6 3.7  CL 118*  < > 119*  --  113* 114* 112* 112* 113*  CO2 15*  < > 15*  --  16* 19* 24 20* 18*  GLUCOSE 85  < > 169*  --  431* 156* 150* 126* 111*  BUN 35*  < > 37*  --  33* 34* 37* 32* 32*  CREATININE 1.16*  < > 1.07*  --  0.93 0.93 1.12* 0.99 0.98  CALCIUM 11.1*  < > 11.9*  < > 10.3 10.5* 9.5 9.6 10.3  MG 1.9  --  1.8  --   --  1.7 1.8 1.8 2.0  PHOS 2.9  --  3.2  --   --   --  1.9* 2.5 3.9  < > = values in this interval not displayed. Liver Function Tests:  Recent Labs Lab 05/16/15 0515 05/19/15 0930 05/20/15 0500  AST 58*  --  42*  ALT 48  --  45  ALKPHOS 236*  --  294*  BILITOT 1.3*  --  0.9  PROT 6.3*  --  6.0*  ALBUMIN 1.6* 1.6* 1.6*   No results for input(s): LIPASE, AMYLASE in the last 168 hours. No results for input(s): AMMONIA in  the last 168 hours. CBC:  Recent Labs Lab 05/16/15 0515 05/17/15 0500 05/18/15 0520 05/19/15 0456 05/20/15 0500  WBC 17.0* 17.2* 15.8* 16.2* 16.3*  NEUTROABS  --   --   --   --  13.3*  HGB 8.4* 7.9* 7.7* 9.3* 9.1*  HCT 25.5* 23.9* 22.9* 28.3* 27.5*  MCV 85.6 85.4 84.2 86.0 85.7  PLT 558* 596* 594* 584* 513*   Cardiac Enzymes: No results for input(s): CKTOTAL, CKMB, CKMBINDEX, TROPONINI in the last 168 hours. BNP (last 3 results) No results for input(s): BNP in the last 8760 hours.  ProBNP (last 3 results) No results for input(s): PROBNP in the last 8760 hours.  CBG:  Recent Labs Lab 05/19/15 1132 05/19/15 1822 05/19/15 2013 05/19/15 2327 05/20/15 0321  GLUCAP 147* 188* 159* 116* 128*    Recent Results (from the past 240 hour(s))  Culture, blood (routine x 2)     Status: None   Collection Time: 05/11/15 10:43 AM  Result Value Ref Range Status   Specimen Description BLOOD BLOOD LEFT HAND  Final   Special Requests BOTTLES DRAWN AEROBIC ONLY [place]  Final   Culture   Final    NO GROWTH 5 DAYS Performed at [facility]    Report Status 05/16/2015 FINAL  Final  Culture, blood (routine x 2)     Status: None   Collection Time: 05/11/15 10:50 AM  Result Value Ref Range Status   Specimen Description BLOOD BLOOD LEFT HAND  Final   Special Requests IN PEDIATRIC BOTTLE 3CC  Final   Culture   Final    NO GROWTH 5 DAYS Performed at [facility]    Report Status 05/16/2015 FINAL  Final  Culture, body fluid-bottle     Status: None   Collection Time: 05/12/15 12:43 PM  Result Value Ref Range Status   Specimen Description FLUID PERITONEAL  Final   Special Requests NONE  Final   Culture   Final    NO GROWTH 5 DAYS Performed at [facility]    Report Status 05/17/2015 FINAL  Final  Gram stain     Status: None   Collection Time: 05/12/15 12:43 PM  Result Value Ref Range Status   Specimen Description FLUID PERITONEAL  Final   Special Requests NONE   Final   Gram Stain   Final    FEW WBC PRESENT, PREDOMINANTLY MONONUCLEAR NO ORGANISMS SEEN Performed at [facility]    Report Status 05/12/2015 FINAL  Final  C difficile quick scan w PCR reflex     Status: None   Collection Time: 05/18/15  8:38 AM  Result Value Ref Range Status   C Diff antigen NEGATIVE NEGATIVE Final   C Diff toxin NEGATIVE NEGATIVE Final   C Diff interpretation Negative for toxigenic C. difficile  Final     Studies: Dg Chest Port 1 View  05/18/2015  CLINICAL DATA:  Shortness of breath, weakness and increased temperature. EXAM: PORTABLE CHEST 1 VIEW COMPARISON:  CT of the chest dated 05/11/2015 FINDINGS: Right PICC line is in stable position, tip at the cavoatrial junction. There has been a placement of feeding catheter, tip collimated off the image at the level of the gastric antrum.  Cardiomediastinal silhouette is normal. Mediastinal contours appear intact. There are low lung volumes with bibasilar atelectasis. Small subpulmonic effusions also possible. There is no evidence of focal airspace consolidation, pleural effusion or pneumothorax. Osseous structures are without acute abnormality. Soft tissues are grossly normal. IMPRESSION: Low lung volumes with bibasilar atelectasis and likely bilateral small subpulmonic effusions. Placement of feeding catheter, tip collimated off the image. Electronically Signed   By: Fidela Salisbury M.D.   On: 05/18/2015 10:59    Scheduled Meds: . antiseptic oral rinse  7 mL Mouth Rinse q12n4p  . aspirin  81 mg Oral Daily  . calcitonin  100 Units Intramuscular Daily  . chlorhexidine  15 mL Mouth Rinse BID  . famotidine (PEPCID) IV  20 mg Intravenous Q24H  . heparin  5,000 Units Subcutaneous 3 times per day  . insulin aspart  0-20 Units Subcutaneous 6 times per day  . insulin glargine  20 Units Subcutaneous BID  . meropenem (MERREM) IV  1 g Intravenous Q12H  . metoprolol  5 mg Intravenous 4 times per day  . sodium chloride   10-40 mL Intracatheter Q12H  . sodium chloride  3 mL Intravenous Q12H   Continuous Infusions: . 0.45 % NaCl with KCl 20 mEq / L 75 mL/hr at 05/19/15 2135  . TPN (CLINIMIX) Adult without lytes 70 mL/hr at 05/19/15 1752   And  . fat emulsion 240 mL (05/19/15 1752)  . feeding supplement (VITAL 1.5 CAL) 1,000 mL (05/19/15 0558)    Principal Problem:   Acute necrotizing pancreatitis with SIRS Active Problems:   Gallstones   Transaminitis   DM type 2 (diabetes mellitus, type 2) (HCC)   Hyperlipidemia   ARF (acute renal failure) (HCC)   Acute respiratory failure with hypoxia (HCC)   Aspiration pneumonia due to vomit (HCC)   Acute on chronic respiratory failure (HCC)   Protein-calorie malnutrition, moderate (HCC)   Umbilical hernia   Chronic kidney disease (CKD), stage III (moderate)   Olivopontocerebellar atrophy (HCC)   Abscess   Fever   Necrotizing pancreatitis     Niel Hummer A MD  [facility] Pager [phone] If 7PM-7AM, please contact night-coverage at [email or web address], password [email or web address] 05/20/2015, 7:50 AM  LOS: 22 days

## 2015-05-20 NOTE — Progress Notes (Signed)
Physical Therapy Treatment Patient Details Name: Victoria Morrow MRN: [id number] DOB: [birth year] Today's Date: 05/20/2015    History of Present Illness 65 yo female admitted 04/28/15 with necrotizing pancreatitis. required ventilator  for aspiration. Extubated 1/5.    PT Comments    Pt progressing, she is motivated to work with PT but limited by tachypnea, HR;  VS:  HR  128-->144-->133          Sats 100% -->85-->100% on RA (decr sats likely d/t poor sensor contact )           RR  26-->36-->30   Follow Up Recommendations  SNF;Supervision/Assistance - 24 hour     Equipment Recommendations  None recommended by PT    Recommendations for Other Services       Precautions / Restrictions Precautions Precautions: Fall Precaution Comments: multi lines, monitor VS, increased HR and RR Restrictions Weight Bearing Restrictions: No    Mobility  Bed Mobility               General bed mobility comments: pt OOB with nursing staff  Transfers Overall transfer level: Needs assistance Equipment used: 2 person hand held assist Transfers: Sit to/from Stand Sit to Stand: Mod assist;+2 physical assistance;+2 safety/equipment         General transfer comment: sit to stand x2 with +2 assist for anterior-superior wt shift,  assist to avoid posterior LOB once in standing; pt able to stand for ~20-25 seconds each trial  Ambulation/Gait                 Stairs            Wheelchair Mobility    Modified Rankin (Stroke Patients Only)       Balance                                    Cognition Arousal/Alertness: Awake/alert Behavior During Therapy: WFL for tasks assessed/performed Overall Cognitive Status: Within Functional Limits for tasks assessed                      Exercises General Exercises - Lower Extremity Ankle Circles/Pumps: AROM;Both;10 reps Long Arc Quad: AROM;Both;10 reps;Seated    General Comments        Pertinent  Vitals/Pain Pain Assessment: No/denies pain    Home Living                      Prior Function            PT Goals (current goals can now be found in the care plan section) Acute Rehab PT Goals Patient Stated Goal: to get stronger. PT Goal Formulation: With patient/family Time For Goal Achievement: 05/22/15 Potential to Achieve Goals: Good Progress towards PT goals: Progressing toward goals    Frequency  Min 3X/week    PT Plan Current plan remains appropriate    Co-evaluation             End of Session Equipment Utilized During Treatment: Gait belt Activity Tolerance: Patient tolerated treatment well;Patient limited by fatigue Patient left: in chair;with call bell/phone within reach;with chair alarm set     Time: 1111-1135 PT Time Calculation (min) (ACUTE ONLY): 24 min  Charges:  $Therapeutic Activity: 23-37 mins                    G Codes:  [facility] 05/20/2015, 1:25 PM

## 2015-05-20 NOTE — Progress Notes (Signed)
Name:Victoria Morrow:[id number] DOB:Aug 02, 1950   ADMISSION DATE: 04/28/2015 CONSULTATION DATE: 1/2  REFERRING MD: Rito Ehrlich  CHIEF COMPLAINT: Hypotension and pancreatitis   BRIEF DESCRIPTION:  65 year old female with PMH DM II admitted on 1/1 with severe acute pancreatitis believed to be related to gallstones but no CBD or pancreatic duct dilation on admission. Takes ACE inhibitor baseline. Had mild gall bladder wall thickening on ultrasound with 5mm stone in neck of gallbladder. Aspirated on 1/4, requiring intubation, extubated 1/5. Fever 1/5.  PCCM had signed off. On-going supportive care continued. PCCM asked to re-visit on 1/23 given tachypnea and increased WOB   SUBJECTIVE:  A little more short of breath  BP 151/80 mmHg  Pulse 116  Temp(Src) 99.3 F (37.4 C) (Oral)  Resp 25  Ht  (1.575 m)  Wt 142 lb 10.2 oz (64.7 kg)  BMI 26.08 kg/m2  SpO2 100%   Intake/Output Summary (Last 24 hours) at 05/20/15 1042 Last data filed at 05/20/15 1000  Gross per 24 hour  Intake   4130 ml  Output   3150 ml  Net    980 ml   PHYSICAL EXAMINATION: General: Awake, alert, conversant , mild resp distress talks in 1-2 word phrases HENT: NCAT, no JVD PULM: CTA B, decreased bases  CV: RRR, no mgr GI: Belly soft, distended, no tenderness MSK: normal bulk and tone Neuro: awake, alert, no distress   CBC Recent Labs     05/18/15  0520  05/19/15  0456  05/20/15  0500  WBC  15.8*  16.2*  16.3*  HGB  7.7*  9.3*  9.1*  HCT  22.9*  28.3*  27.5*  PLT  594*  584*  513*    Coag's No results for input(s): APTT, INR in the last 72 hours.  BMET Recent Labs     05/18/15  0520  05/19/15  0456  05/20/15  0500  NA  146*  143  141  K  3.8  3.6  3.7  CL  112*  112*  113*  CO2  24  20*  18*  BUN  37*  32*  32*  CREATININE  1.12*  0.99  0.98  GLUCOSE  150*  126*  111*    Electrolytes Recent Labs     05/18/15  0520  05/19/15  0456  05/20/15  0500  CALCIUM  9.5  9.6  10.3  MG  1.8  1.8  2.0  PHOS  1.9*  2.5  3.9    Sepsis Markers No results for input(s): PROCALCITON, O2SATVEN in the last 72 hours.  Invalid input(s): LACTICACIDVEN  ABG Recent Labs     05/20/15  1008  PHART  7.454*  PCO2ART  22.9*  PO2ART  78.8*    Liver Enzymes Recent Labs     05/19/15  0930  05/20/15  0500  AST   --   42*  ALT   --   45  ALKPHOS   --   294*  BILITOT   --   0.9  ALBUMIN  1.6*  1.6*    Cardiac Enzymes No results for input(s): TROPONINI, PROBNP in the last 72 hours.  Glucose Recent Labs     05/19/15  1132  05/19/15  1822  05/19/15  2013  05/19/15  2327  05/20/15  0321  05/20/15  0808  GLUCAP  147*  188*  159*  116*  128*  122*    Imaging Dg Chest Port 1 View  05/20/2015  CLINICAL DATA:  65 year old female, respiratory failure and hypoxia. Vomiting, possible aspiration. Severe pancreatitis, pancreatic necrosis. Initial encounter. EXAM: PORTABLE CHEST 1 VIEW COMPARISON:  05/18/2015 and earlier. FINDINGS: Portable AP semi upright view at 0844 hours. Enteric feeding tube in place, courses to the mid abdomen and tip not included. Stable right PICC line. Stable cardiac size and mediastinal contours. Continued bilateral lower lobe consolidation. Low lung volumes. No pneumothorax, pulmonary edema, or definite effusion. IMPRESSION: 1. Continued bilateral lower lobe consolidation which could be related to aspiration pneumonia. No definite pleural effusion. 2. Low lung volumes. 3.  Stable lines and tubes. Electronically Signed   By: Odessa Fleming M.D.   On: 05/20/2015 09:00  low volume/ atx  CULTURES: BCX2 1/1>>> neg UC 1/1>>> Klebsiella, amp resistant BC x2 1/5 > ng Resp cx 1/5 >  C diff 1/6 > neg  ANTIBIOTICS: Imipenem 1/1>>> 1/3 Ceftriaxone 1/3 > 1/4 Flagyl 1/3 > 1/5 Meropenem 1/4 >  Oral Vanc 1/6> 1/8  LINES/TUBES: 1/4 ETT > 1/5 1/4 L IJ CVL > out   DISCUSSION: 65 year old female admitted severe acute pancreatitis,  presumably gallstone related but no obstructing stone seen on imaging aside from one in neck of gallbladder. Aspirated on 1/4, requiring intubation and central line placement. She has had very slow progress and is profoundly deconditioned. We were called back to assess her d/t increased WOB and accessory muscle use. She does have on-going fevers in setting of pancreatitis, NAG metabolic acidosis and she is very weak. From a pulmonary stand-point suspect that these are contributing to her work of breathing. Would continue aggressive pulmonary hygiene, continue to watch for pancreatitis transition to pseudocyst and for now we will add bicarb to see if this helps decrease her WOB.  ASSESSMENT / PLAN:   Dyspnea/ Tachypnea w/ increased WOB-->likely multifactorial in setting of atx, deconditioning and severe malnutrition.  Primary respiratory alkalosis as well as metabolic acidosis Plan:  Incentive spirometry / flutter valve Mobilize as able  PRN xopenex Ck cortisol Expect very slow progress   Metabolic acidosis w/ hyperchloremia -->this may be contributing to his WOB  Plan Add bicarb gtt F/u chemistry    Acute biliary pancreatitis with necrosis, cholelithiasis Plan Continue TF's as tolerated TNA per surg may need to repeat CT next 1-2 days PRN zofran Cholecystectomy eventually per gen surgery once pt stabilized  Remains on Meropenem   Anemia & Leucocytosis Plan:  Trend CBC Transfuse per ICU protocol  [place] heparin for DVT prophylaxis   Hyperglycemia Plan  SSI Increased lantus to 30 units QD Insulin in TNA  Yeast UTI Plan Diflucan per medicine   Extreme deconditioning Plan:  Fentanyl PRN PT consult

## 2015-05-20 NOTE — Progress Notes (Signed)
VASCULAR LAB PRELIMINARY  PRELIMINARY  PRELIMINARY  PRELIMINARY  Bilateral lower extremity venous duplex  completed.    Preliminary report:  Bilateral:  No evidence of DVT, superficial thrombosis, or Baker's Cyst.    Liara Holm, RVT 05/20/2015, 12:04 PM

## 2015-05-21 LAB — COMPREHENSIVE METABOLIC PANEL
ALBUMIN: 1.6 g/dL — AB (ref 3.5–5.0)
ALK PHOS: 317 U/L — AB (ref 38–126)
ALT: 48 U/L (ref 14–54)
ANION GAP: 8 (ref 5–15)
AST: 56 U/L — AB (ref 15–41)
BILIRUBIN TOTAL: 0.6 mg/dL (ref 0.3–1.2)
BUN: 29 mg/dL — AB (ref 6–20)
CHLORIDE: 113 mmol/L — AB (ref 101–111)
CO2: 24 mmol/L (ref 22–32)
Calcium: 9.7 mg/dL (ref 8.9–10.3)
Creatinine, Ser: 1.01 mg/dL — ABNORMAL HIGH (ref 0.44–1.00)
GFR calc Af Amer: >60 mL/min (ref >60)
GFR calc non Af Amer: 57 mL/min — ABNORMAL LOW (ref >60)
GLUCOSE: 134 mg/dL — AB (ref 65–99)
Potassium: 3.5 mmol/L (ref 3.5–5.1)
SODIUM: 145 mmol/L (ref 135–145)
Total Protein: 6.1 g/dL — ABNORMAL LOW (ref 6.5–8.1)

## 2015-05-21 LAB — CBC
HEMATOCRIT: 26.3 % — AB (ref 36.0–46.0)
HEMOGLOBIN: 8.5 g/dL — AB (ref 12.0–15.0)
MCH: 28.5 pg (ref 26.0–34.0)
MCHC: 32.3 g/dL (ref 30.0–36.0)
MCV: 88.3 fL (ref 78.0–100.0)
Platelets: 527 10*3/uL — ABNORMAL HIGH (ref 150–400)
RBC: 2.98 MIL/uL — AB (ref 3.87–5.11)
RDW: 14.6 % (ref 11.5–15.5)
WBC: 14.4 10*3/uL — AB (ref 4.0–10.5)

## 2015-05-21 LAB — GLUCOSE, CAPILLARY
GLUCOSE-CAPILLARY: 147 mg/dL — AB (ref 65–99)
GLUCOSE-CAPILLARY: 140 mg/dL — AB (ref 65–99)
GLUCOSE-CAPILLARY: 130 mg/dL — AB (ref 65–99)
Glucose-Capillary: 115 mg/dL — ABNORMAL HIGH (ref 65–99)
Glucose-Capillary: 121 mg/dL — ABNORMAL HIGH (ref 65–99)
Glucose-Capillary: 123 mg/dL — ABNORMAL HIGH (ref 65–99)
Glucose-Capillary: 173 mg/dL — ABNORMAL HIGH (ref 65–99)

## 2015-05-21 LAB — PHOSPHORUS: Phosphorus: 2.1 mg/dL — ABNORMAL LOW (ref 2.5–4.6)

## 2015-05-21 LAB — MAGNESIUM: Magnesium: 1.5 mg/dL — ABNORMAL LOW (ref 1.7–2.4)

## 2015-05-21 LAB — LACTIC ACID, PLASMA: LACTIC ACID, VENOUS: 1.2 mmol/L (ref 0.5–2.0)

## 2015-05-21 MED ORDER — TRACE MINERALS CR-CU-MN-SE-ZN 10-1000-500-60 MCG/ML IV SOLN
INTRAVENOUS | Status: AC
  Administered 2015-05-21: 19:00:00 via INTRAVENOUS
  Filled 2015-05-21: qty 1680

## 2015-05-21 MED ORDER — VITAL 1.5 CAL PO LIQD
1000.0000 mL | ORAL | Status: DC
  Administered 2015-05-22 – 2015-05-23 (×2): 1000 mL
  Filled 2015-05-21 (×2): qty 1000

## 2015-05-21 MED ORDER — POTASSIUM PHOSPHATES 15 MMOLE/5ML IV SOLN
10.0000 mmol | Freq: Once | INTRAVENOUS | Status: AC
  Administered 2015-05-21: 10 mmol via INTRAVENOUS
  Filled 2015-05-21: qty 3.33

## 2015-05-21 MED ORDER — FAT EMULSION 20 % IV EMUL
240.0000 mL | INTRAVENOUS | Status: AC
  Administered 2015-05-21: 240 mL via INTRAVENOUS
  Filled 2015-05-21: qty 250

## 2015-05-21 MED ORDER — MAGNESIUM SULFATE 2 GM/50ML IV SOLN
2.0000 g | Freq: Once | INTRAVENOUS | Status: AC
  Administered 2015-05-21: 2 g via INTRAVENOUS
  Filled 2015-05-21: qty 50

## 2015-05-21 NOTE — Progress Notes (Signed)
Patient ID: Victoria Morrow, female   DOB: 13-May-1950, 65 y.o.   MRN: [id number]  General Surgery [facility]  Subjective: Patient awake, responsive.  Less dyspneic.  No complaints.  TF's @ 10cc/hr.  Objective: Vital signs in last 24 hours: Temp:  [98.4 F (36.9 C)-100.5 F (38.1 C)] 98.8 F (37.1 C) (01/24 0336) Pulse Rate:  [100-127] 127 (01/24 0600) Resp:  [23-54] 34 (01/24 0600) BP: (104-151)/(61-88) 143/88 mmHg (01/24 0600) SpO2:  [96 %-100 %] 100 % (01/24 0600) Weight:  [64.3 kg (141 lb 12.1 oz)] 64.3 kg (141 lb 12.1 oz) (01/24 0426) Last BM Date: 05/19/15  Intake/Output from previous day: 01/23 0701 - 01/24 0700 In: 3816.3 [I.V.:1616.3; NG/GT:230; IV Piggyback:250; TPN:1720] Out: 3600 [Urine:3600] Intake/Output this shift:    Physical Exam: HEENT - sclerae clear, mucous membranes moist Neck - soft Chest - clear bilaterally, shallow Cor - tachycardic Abdomen - moderate distension; slight softer, non-tender; no mass  Lab Results:   Recent Labs  05/20/15 0500 05/21/15 0420  WBC 16.3* 14.4*  HGB 9.1* 8.5*  HCT 27.5* 26.3*  PLT 513* 527*   BMET  Recent Labs  05/20/15 0500 05/21/15 0420  NA 141 145  K 3.7 3.5  CL 113* 113*  CO2 18* 24  GLUCOSE 111* 134*  BUN 32* 29*  CREATININE 0.98 1.01*  CALCIUM 10.3 9.7   PT/INR No results for input(s): LABPROT, INR in the last 72 hours. Comprehensive Metabolic Panel:    Component Value Date/Time   NA 145 05/21/2015 0420   NA 141 05/20/2015 0500   K 3.5 05/21/2015 0420   K 3.7 05/20/2015 0500   CL 113* 05/21/2015 0420   CL 113* 05/20/2015 0500   CO2 24 05/21/2015 0420   CO2 18* 05/20/2015 0500   BUN 29* 05/21/2015 0420   BUN 32* 05/20/2015 0500   CREATININE 1.01* 05/21/2015 0420   CREATININE 0.98 05/20/2015 0500   GLUCOSE 134* 05/21/2015 0420   GLUCOSE 111* 05/20/2015 0500   CALCIUM 9.7 05/21/2015 0420   CALCIUM 10.3 05/20/2015 0500   CALCIUM 10.5* 05/16/2015 1010   AST 56*  05/21/2015 0420   AST 42* 05/20/2015 0500   ALT 48 05/21/2015 0420   ALT 45 05/20/2015 0500   ALKPHOS 317* 05/21/2015 0420   ALKPHOS 294* 05/20/2015 0500   BILITOT 0.6 05/21/2015 0420   BILITOT 0.9 05/20/2015 0500   PROT 6.1* 05/21/2015 0420   PROT 6.0* 05/20/2015 0500   ALBUMIN 1.6* 05/21/2015 0420   ALBUMIN 1.6* 05/20/2015 0500    Studies/Results: Dg Chest Port 1 View  05/20/2015  CLINICAL DATA:  65 year old female, respiratory failure and hypoxia. Vomiting, possible aspiration. Severe pancreatitis, pancreatic necrosis. Initial encounter. EXAM: PORTABLE CHEST 1 VIEW COMPARISON:  05/18/2015 and earlier. FINDINGS: Portable AP semi upright view at 0844 hours. Enteric feeding tube in place, courses to the mid abdomen and tip not included. Stable right PICC line. Stable cardiac size and mediastinal contours. Continued bilateral lower lobe consolidation. Low lung volumes. No pneumothorax, pulmonary edema, or definite effusion. IMPRESSION: 1. Continued bilateral lower lobe consolidation which could be related to aspiration pneumonia. No definite pleural effusion. 2. Low lung volumes. 3.  Stable lines and tubes. Electronically Signed   By: Odessa Fleming M.D.   On: 05/20/2015 09:00    Anti-infectives: Anti-infectives    Start     Dose/Rate Route Frequency Ordered Stop   05/18/15 2200  meropenem (MERREM) 1 g in sodium chloride 0.9 % 100 mL IVPB  1 g 200 mL/hr over 30 Minutes Intravenous Every 12 hours 05/18/15 0859     05/17/15 1800  meropenem (MERREM) 1 g in sodium chloride 0.9 % 100 mL IVPB  Status:  Discontinued     1 g 200 mL/hr over 30 Minutes Intravenous Every 8 hours 05/17/15 1022 05/18/15 0859   05/13/15 1000  fluconazole (DIFLUCAN) IVPB 100 mg     100 mg 50 mL/hr over 60 Minutes Intravenous Every 24 hours 05/12/15 0813 05/16/15 1300   05/12/15 1000  fluconazole (DIFLUCAN) IVPB 200 mg     200 mg 100 mL/hr over 60 Minutes Intravenous  Once 05/12/15 0813 05/12/15 1029   05/08/15 2200   meropenem (MERREM) 1 g in sodium chloride 0.9 % 100 mL IVPB  Status:  Discontinued     1 g 200 mL/hr over 30 Minutes Intravenous Every 12 hours 05/08/15 1054 05/17/15 1022   05/03/15 1200  vancomycin (VANCOCIN) 50 mg/mL oral solution 500 mg  Status:  Discontinued     500 mg Oral 4 times per day 05/03/15 0917 05/04/15 1306   05/02/15 1200  cefTRIAXone (ROCEPHIN) 1 g in dextrose 5 % 50 mL IVPB  Status:  Discontinued     1 g 100 mL/hr over 30 Minutes Intravenous Every 24 hours 05/01/15 1448 05/01/15 2004   05/02/15 0600  imipenem-cilastatin (PRIMAXIN) 250 mg in sodium chloride 0.9 % 100 mL IVPB  Status:  Discontinued     250 mg 200 mL/hr over 30 Minutes Intravenous 3 times per day 05/01/15 2025 05/02/15 0027   05/02/15 0030  meropenem (MERREM) 500 mg in sodium chloride 0.9 % 50 mL IVPB  Status:  Discontinued     500 mg 100 mL/hr over 30 Minutes Intravenous Every 12 hours 05/02/15 0027 05/08/15 1054   05/01/15 2030  imipenem-cilastatin (PRIMAXIN) 250 mg in sodium chloride 0.9 % 100 mL IVPB     250 mg 200 mL/hr over 30 Minutes Intravenous NOW 05/01/15 2025 05/01/15 2110   04/30/15 1100  metroNIDAZOLE (FLAGYL) IVPB 500 mg  Status:  Discontinued     500 mg 100 mL/hr over 60 Minutes Intravenous Every 12 hours 04/30/15 0951 05/02/15 0751   04/30/15 1000  cefTRIAXone (ROCEPHIN) 1 g in dextrose 5 % 50 mL IVPB  Status:  Discontinued     1 g 100 mL/hr over 30 Minutes Intravenous Every 24 hours 04/30/15 0939 05/01/15 1205   04/30/15 0945  cefTRIAXone (ROCEPHIN) injection 1 g  Status:  Discontinued     1 g Intramuscular Every 24 hours 04/30/15 0930 04/30/15 0938   04/29/15 0200  imipenem-cilastatin (PRIMAXIN) 250 mg in sodium chloride 0.9 % 100 mL IVPB  Status:  Discontinued     250 mg 200 mL/hr over 30 Minutes Intravenous Every 8 hours 04/28/15 1812 04/30/15 0930   04/28/15 1830  imipenem-cilastatin (PRIMAXIN) 250 mg in sodium chloride 0.9 % 100 mL IVPB     250 mg 200 mL/hr over 30 Minutes  Intravenous  Once 04/28/15 1812 04/28/15 1904      Assessment & Plans: Acute biliary pancreatitis with necrosis, cholelithiasis OK to increase TF's to 20cc/hr per nutrition recs TNA, NPO Empiric abx per medical service Consider repeat CT scan later this week - ?Thursday  WBC stable, slightly improved  Will follow - no role for acute surgical intervention at this point  Velora Heckler, MD, [facility] Office: [phone]   Naol Ontiveros Judie Petit 05/21/2015

## 2015-05-21 NOTE — Progress Notes (Signed)
Name:Victoria Morrow:[id number] DOB:06/14/1950   ADMISSION DATE: 04/28/2015 CONSULTATION DATE: 1/2  REFERRING MD: Rito Ehrlich  CHIEF COMPLAINT: Hypotension and pancreatitis   BRIEF DESCRIPTION:  65 year old female with PMH DM II admitted on 1/1 with severe acute pancreatitis believed to be related to gallstones but no CBD or pancreatic duct dilation on admission. Takes ACE inhibitor baseline. Had mild gall bladder wall thickening on ultrasound with 5mm stone in neck of gallbladder. Aspirated on 1/4, requiring intubation, extubated 1/5. Fever 1/5.  PCCM had signed off. On-going supportive care continued. PCCM asked to re-visit on 1/23 given tachypnea and increased WOB   SUBJECTIVE:  A little more short of breath  BP 143/88 mmHg  Pulse 127  Temp(Src) 99.8 F (37.7 C) (Oral)  Resp 34  Ht  (1.575 m)  Wt 141 lb 12.1 oz (64.3 kg)  BMI 25.92 kg/m2  SpO2 100%   Intake/Output Summary (Last 24 hours) at 05/21/15 0921 Last data filed at 05/21/15 0600  Gross per 24 hour  Intake 3326.25 ml  Output   3000 ml  Net 326.25 ml   PHYSICAL EXAMINATION: General: Awake, alert, conversant , WOB a little better  HENT: NCAT, no JVD PULM: CTA B, decreased bases  CV: RRR, no mgr GI: Belly soft, distended, no tenderness MSK: normal bulk and tone Neuro: awake, alert, no distress   CBC Recent Labs     05/19/15  0456  05/20/15  0500  05/21/15  0420  WBC  16.2*  16.3*  14.4*  HGB  9.3*  9.1*  8.5*  HCT  28.3*  27.5*  26.3*  PLT  584*  513*  527*    Coag's No results for input(s): APTT, INR in the last 72 hours.  BMET Recent Labs     05/19/15  0456  05/20/15  0500  05/21/15  0420  NA  143  141  145  K  3.6  3.7  3.5  CL  112*  113*  113*  CO2  20*  18*  24  BUN  32*  32*  29*  CREATININE  0.99  0.98  1.01*  GLUCOSE  126*  111*  134*    Electrolytes Recent Labs     05/19/15  0456  05/20/15  0500  05/21/15  0420  CALCIUM  9.6  10.3   9.7  MG  1.8  2.0  1.5*  PHOS  2.5  3.9  2.1*    Sepsis Markers No results for input(s): PROCALCITON, O2SATVEN in the last 72 hours.  Invalid input(s): LACTICACIDVEN  ABG Recent Labs     05/20/15  1008  PHART  7.454*  PCO2ART  22.9*  PO2ART  78.8*    Liver Enzymes Recent Labs     05/19/15  0930  05/20/15  0500  05/21/15  0420  AST   --   42*  56*  ALT   --   45  48  ALKPHOS   --   294*  317*  BILITOT   --   0.9  0.6  ALBUMIN  1.6*  1.6*  1.6*    Cardiac Enzymes No results for input(s): TROPONINI, PROBNP in the last 72 hours.  Glucose Recent Labs     05/20/15  1231  05/20/15  1516  05/20/15  1937  05/20/15  2346  05/21/15  0420  05/21/15  0732  GLUCAP  150*  133*  124*  121*  123*  147*    Imaging Dg  Chest Port 1 View  05/20/2015  CLINICAL DATA:  65 year old female, respiratory failure and hypoxia. Vomiting, possible aspiration. Severe pancreatitis, pancreatic necrosis. Initial encounter. EXAM: PORTABLE CHEST 1 VIEW COMPARISON:  05/18/2015 and earlier. FINDINGS: Portable AP semi upright view at 0844 hours. Enteric feeding tube in place, courses to the mid abdomen and tip not included. Stable right PICC line. Stable cardiac size and mediastinal contours. Continued bilateral lower lobe consolidation. Low lung volumes. No pneumothorax, pulmonary edema, or definite effusion. IMPRESSION: 1. Continued bilateral lower lobe consolidation which could be related to aspiration pneumonia. No definite pleural effusion. 2. Low lung volumes. 3.  Stable lines and tubes. Electronically Signed   By: Odessa Fleming M.D.   On: 05/20/2015 09:00  low volume/ atx  CULTURES: BCX2 1/1>>> neg UC 1/1>>> Klebsiella, amp resistant BC x2 1/5 > ng Resp cx 1/5 >  C diff 1/6 > neg  ANTIBIOTICS: Imipenem 1/1>>> 1/3 Ceftriaxone 1/3 > 1/4 Flagyl 1/3 > 1/5 Meropenem 1/4 >  Oral Vanc 1/6> 1/8  LINES/TUBES: 1/4 ETT > 1/5 1/4 L IJ CVL > out   DISCUSSION: 65 year old female admitted severe  acute pancreatitis, presumably gallstone related but no obstructing stone seen on imaging aside from one in neck of gallbladder. Aspirated on 1/4, requiring intubation and central line placement. She has had very slow progress and is profoundly deconditioned. We were called back to assess her d/t increased WOB and accessory muscle use. She does have on-going fevers in setting of pancreatitis, NAG metabolic acidosis and she is very weak. From a pulmonary stand-point suspect that all of these things combined have contributed to her WOB. Would continue aggressive pulmonary hygiene, continue to watch for pancreatitis transition to pseudocyst. Have decreased bicarb infusion rate. If stable this can be d/c'd. Unfortunately don't think there is any specific pulmonary intervention that will change this. Primary intervention her is time, PT and nutrition.  We will s/o.   ASSESSMENT / PLAN:   Dyspnea/ Tachypnea w/ increased WOB-->likely multifactorial in setting of atx, deconditioning and severe malnutrition.  Primary respiratory alkalosis as well as metabolic acidosis -WOB a little better as of 1/14. ? Time vs correction of acidosis?  Plan:  Incentive spirometry / flutter valve Mobilize as able  PRN xopenex Expect very slow progress   Metabolic acidosis w/ hyperchloremia -->this may be contributing to his WOB  Plan Added bicarb gtt-->will decrease to 34ml/hr and then off on 1/25 F/u chemistry    Acute biliary pancreatitis with necrosis, cholelithiasis Plan Continue TF's as tolerated TNA per surg may need to repeat CT next 1-2 days (per surgical note) PRN zofran Cholecystectomy eventually per gen surgery once pt stabilized  Remains on Meropenem   Anemia & Leucocytosis Plan:  Trend CBC Transfuse per ICU protocol  Harmon heparin for DVT prophylaxis   Hyperglycemia Plan  SSI & lantus Insulin in TNA  Yeast UTI Plan Diflucan per medicine   Extreme deconditioning Plan:   Fentanyl PRN PT consult

## 2015-05-21 NOTE — Progress Notes (Signed)
[facility] PROGRESS NOTE  Victoria Morrow TKZ:[id number] DOB: 05-15-1950 DOA: 04/28/2015 PCP: Rachell Cipro, MD  HPI/Brief narrative 65 year old African-American female with a past medical history of type 2 diabetes on insulin, chronic kidney disease of unknown stage, presented with abdominal pain, nausea and vomiting. She was found to have severe pancreatitis thought to be biliary in origin. She was hospitalized for further management. Critical care medicine was consulted. 1/4 vomited, aspirated, intubated, shock  Required neo since 1/7, transferred care under critical care 1/5 extubated 1/7 SVT requiring cardizem gtt 1/8 Vomiting - NG to LIS 1/12 off cardizem gtt 1/14 care transferred to [facility], with critical care and general surgery following .Marland Kitchen  Patient getting treatment for pancreatitis, pancreatic necrosis. She is on IV antibiotics, WBC fluctuates, spikes fevers. Surgery following planning to repeat CT scan at end of week. She was started on tube feeding, low rate.  Regarding her Respiratory Failure , Oxygen sat stable 99 RA,  RR still in the 30. She is on IV bicarb for metabolic acidosis. Needs to follow calcium level, might need further calcitonin doses.    Assessment/Plan:  Necrotizing pancreatitis -CT scan with evidence of gallstones. Patient denies any history of alcohol intake. Ultrasound of the abdomen demonstrates GB thickening with pericholecystic fluid and 71m stone lodged in neck of gallbladder and no Murphy's sign. CBD is normal size.  -General surgery is following. No clear evidence for acute cholecystitis.  -T continue on abx, tpn, diflucan added on 1/14, bowel rest. -Patient not able to tolerate MRI of abdomen due to not able to hold breath for longer duration. - -CT abdomen pelvis : extensive pancreatic necrosis, liquefaction of the head, neck and body, large amount of extending into paracolic glutter.  -Dr IDalbert Batmanreviewed CT scan, no surgery indication at this  time unless patient decompensate. Prior fluid aspiration has remain sterile.  -WBC at  16, spiking fevers, plan per surgery . UA negative, urine culture await re-culture.  -appreciate surgery help/  -post pyloric NG tube in place.  Patient continue to spike fevers, her RR this am has been in 40, she is using abdomen to breath.  I will check Chest X ray, and Stat ABG. I will consult CMM.   Fever:  Repeated culture , ua +yeast,  started diflucan,   Ct chest/ab/pel on 1/14:  pancreatic necrosis with poor definition of the head and body the pancreas with increased low-attenuation edema compared to prior exam. 2. Increased fluid along the pericolic gutters. The are no clear organized pseudocyst at this point. The RIGHT pericolic gutter collection is the most organized.  IR consulted  for fluids collection drain and culture. UKoreaguided para was done on 1/15, culture :  No growth to date, final   Continue to Spike fever 05-18-2015.  Chest x ray negative for PNA , C diff negative. Might be related to pancreatic necrosis. Defer to surgery further drainage of pancreatic fluids.  Repeated Urine culture 1-22: no growth...   Metabolic acidosis.; Related to pancreatitis.  On Bicarb Gtt   Hypercalcemia;  might be related to pancreatitis.  PTH normal , Vitamin D level low.  Continue with IV fluids.  Awaiting albumin level for correction of  Calcium level.  Calcium at 11. Received Calcitonin.   Anemia: likely anemia of chronic disease, patient remain sinus tachycardia, tachypenic, transfuse 1prbc on 1/16. S P 1 unit PRBC 1-21 Hb stable at 8.5  Hypomagnesemia replaced, IV mag today   Septic shock: required intubation and pressors. Now off pressors,  extubated.   VDRF; intubated on 1/14 and extubated on 1/15  PAF/SVT:  required cardizem drip, now on lopressor.  Has been in sinus tachycardia since transfer to [facility] Echo lvef 55%-60%, dynamic obstruction reported, grade 1 diastolic dysfunction reported.   increase betablocker iv lopressor from q8hrs to q6hr on 1/17, continue monitor bp and heart rate.  UTI: kleb pneu finished abx treatment  Urinary retention: urology consulted initially during hospitalization due to difficult foley placed placement. She is found to have grade 2 cystocele and urethral stenosis.  ARF on CKD III; better, cr now back to baseline, cr 2.8 on admission, nadir 1.18 on 1/14, today 1,0. Continue with IV fluids.  Good urine out put at 1.8, mild increase BUN and Cr. Continue with IV fluids. Repeat labs in am. Mild continue  IV fluids to half NS.   Transaminitis: LFTs are slightly improved. Probably related to her necrotizing pancreatitis/sepsis/tpn. Ultrasound of the right upper quadrant as above. Hepatitis panel is negative.  Continue to trend LFTs. Alk phosphatase still elevated.   Insulin dependent Diabetes mellitus type 2, poorly controlled, with chronic kidney disease HbA1c is 7.7.  Continue insulin, adjust prn  insulin, TPN Decreased  lantus to 20 units BID 1-18, CBG in the 80. Also getting insulin with TPN.  Adjust insulin as needed. Appreciate Pharmacy assistance.   Code Status: Full Family Communication: Patient, family updated 1-21 Disposition Plan:  remain in stepdown treatment for necrotizing pancreatitis.    Consultants:  TRH-1/4PCCM -1/14TRH  General Surgery  Urology  Procedures:  Intubation /extubation/ng  Central line  tpn  US guided para on 1/15   Antibiotics: Anti-infectives    Start     Dose/Rate Route Frequency Ordered Stop   05/18/15 2200  meropenem (MERREM) 1 g in sodium chloride 0.9 % 100 mL IVPB     1 g 200 mL/hr over 30 Minutes Intravenous Every 12 hours 05/18/15 0859     05/17/15 1800  meropenem (MERREM) 1 g in sodium chloride 0.9 % 100 mL IVPB  Status:  Discontinued     1 g 200 mL/hr over 30 Minutes Intravenous Every 8 hours 05/17/15 1022 05/18/15 0859   05/13/15 1000  fluconazole (DIFLUCAN) IVPB 100 mg     100  mg 50 mL/hr over 60 Minutes Intravenous Every 24 hours 05/12/15 0813 05/16/15 1300   05/12/15 1000  fluconazole (DIFLUCAN) IVPB 200 mg     200 mg 100 mL/hr over 60 Minutes Intravenous  Once 05/12/15 0813 05/12/15 1029   05/08/15 2200  meropenem (MERREM) 1 g in sodium chloride 0.9 % 100 mL IVPB  Status:  Discontinued     1 g 200 mL/hr over 30 Minutes Intravenous Every 12 hours 05/08/15 1054 05/17/15 1022   05/03/15 1200  vancomycin (VANCOCIN) 50 mg/mL oral solution 500 mg  Status:  Discontinued     500 mg Oral 4 times per day 05/03/15 0917 05/04/15 1306   05/02/15 1200  cefTRIAXone (ROCEPHIN) 1 g in dextrose 5 % 50 mL IVPB  Status:  Discontinued     1 g 100 mL/hr over 30 Minutes Intravenous Every 24 hours 05/01/15 1448 05/01/15 2004   05/02/15 0600  imipenem-cilastatin (PRIMAXIN) 250 mg in sodium chloride 0.9 % 100 mL IVPB  Status:  Discontinued     250 mg 200 mL/hr over 30 Minutes Intravenous 3 times per day 05/01/15 2025 05/02/15 0027   05/02/15 0030  meropenem (MERREM) 500 mg in sodium chloride 0.9 % 50 mL IVPB  Status:  Discontinued     500 mg 100 mL/hr over 30 Minutes Intravenous Every 12 hours 05/02/15 0027 05/08/15 1054   05/01/15 2030  imipenem-cilastatin (PRIMAXIN) 250 mg in sodium chloride 0.9 % 100 mL IVPB     250 mg 200 mL/hr over 30 Minutes Intravenous NOW 05/01/15 2025 05/01/15 2110   04/30/15 1100  metroNIDAZOLE (FLAGYL) IVPB 500 mg  Status:  Discontinued     500 mg 100 mL/hr over 60 Minutes Intravenous Every 12 hours 04/30/15 0951 05/02/15 0751   04/30/15 1000  cefTRIAXone (ROCEPHIN) 1 g in dextrose 5 % 50 mL IVPB  Status:  Discontinued     1 g 100 mL/hr over 30 Minutes Intravenous Every 24 hours 04/30/15 0939 05/01/15 1205   04/30/15 0945  cefTRIAXone (ROCEPHIN) injection 1 g  Status:  Discontinued     1 g Intramuscular Every 24 hours 04/30/15 0930 04/30/15 0938   04/29/15 0200  imipenem-cilastatin (PRIMAXIN) 250 mg in sodium chloride 0.9 % 100 mL IVPB  Status:   Discontinued     250 mg 200 mL/hr over 30 Minutes Intravenous Every 8 hours 04/28/15 1812 04/30/15 0930   04/28/15 1830  imipenem-cilastatin (PRIMAXIN) 250 mg in sodium chloride 0.9 % 100 mL IVPB     250 mg 200 mL/hr over 30 Minutes Intravenous  Once 04/28/15 1812 04/28/15 1904      HPI/Subjective: Flat affect , denies abdominal pain, ,relates diarrhea.   Objective: Filed Vitals:   05/21/15 0336 05/21/15 0400 05/21/15 0426 05/21/15 0600  BP:  122/61  143/88  Pulse:  123  127  Temp: 98.8 F (37.1 C)     TempSrc: Oral     Resp:  36  34  Height:      Weight:   64.3 kg (141 lb 12.1 oz)   SpO2:  97%  100%    Intake/Output Summary (Last 24 hours) at 05/21/15 0818 Last data filed at 05/21/15 0600  Gross per 24 hour  Intake 3651.25 ml  Output   3600 ml  Net  51.25 ml   Filed Weights   05/18/15 0500 05/19/15 0500 05/21/15 0426  Weight: 61.3 kg (135 lb 2.3 oz) 64.7 kg (142 lb 10.2 oz) 64.3 kg (141 lb 12.1 oz)    Exam:   General:  Very frail, flat affect,  tacypnea  Cardiovascular: tachycardic, s1, s2  Respiratory: decreased at basis, no wheezing, no rales, no rhonchi  Abdomen: obese, positive BS, non tender, distended.   Musculoskeletal: perfused, no clubbing   Data Reviewed: Basic Metabolic Panel:  Recent Labs Lab 05/16/15 0515  05/17/15 0500 05/18/15 0520 05/19/15 0456 05/20/15 0500 05/21/15 0420  NA 143  < > 143 146* 143 141 145  K 3.7  < > 3.6 3.8 3.6 3.7 3.5  CL 119*  < > 114* 112* 112* 113* 113*  CO2 15*  < > 19* 24 20* 18* 24  GLUCOSE 169*  < > 156* 150* 126* 111* 134*  BUN 37*  < > 34* 37* 32* 32* 29*  CREATININE 1.07*  < > 0.93 1.12* 0.99 0.98 1.01*  CALCIUM 11.9*  < > 10.5* 9.5 9.6 10.3 9.7  MG 1.8  --  1.7 1.8 1.8 2.0 1.5*  PHOS 3.2  --   --  1.9* 2.5 3.9 2.1*  < > = values in this interval not displayed. Liver Function Tests:  Recent Labs Lab 05/16/15 0515 05/19/15 0930 05/20/15 0500 05/21/15 0420  AST 58*  --  42* 56*  ALT 48  --  45 48  ALKPHOS 236*  --  294* 317*  BILITOT 1.3*  --  0.9 0.6  PROT 6.3*  --  6.0* 6.1*  ALBUMIN 1.6* 1.6* 1.6* 1.6*   No results for input(s): LIPASE, AMYLASE in the last 168 hours. No results for input(s): AMMONIA in the last 168 hours. CBC:  Recent Labs Lab 05/17/15 0500 05/18/15 0520 05/19/15 0456 05/20/15 0500 05/21/15 0420  WBC 17.2* 15.8* 16.2* 16.3* 14.4*  NEUTROABS  --   --   --  13.3*  --   HGB 7.9* 7.7* 9.3* 9.1* 8.5*  HCT 23.9* 22.9* 28.3* 27.5* 26.3*  MCV 85.4 84.2 86.0 85.7 88.3  PLT 596* 594* 584* 513* 527*   Cardiac Enzymes: No results for input(s): CKTOTAL, CKMB, CKMBINDEX, TROPONINI in the last 168 hours. BNP (last 3 results) No results for input(s): BNP in the last 8760 hours.  ProBNP (last 3 results) No results for input(s): PROBNP in the last 8760 hours.  CBG:  Recent Labs Lab 05/20/15 1231 05/20/15 1516 05/20/15 1937 05/20/15 2346 05/21/15 0420  GLUCAP 150* 133* 124* 121* 123*    Recent Results (from the past 240 hour(s))  Culture, blood (routine x 2)     Status: None   Collection Time: 05/11/15 10:43 AM  Result Value Ref Range Status   Specimen Description BLOOD BLOOD LEFT HAND  Final   Special Requests BOTTLES DRAWN AEROBIC ONLY [place]  Final   Culture   Final    NO GROWTH 5 DAYS Performed at [facility]    Report Status 05/16/2015 FINAL  Final  Culture, blood (routine x 2)     Status: None   Collection Time: 05/11/15 10:50 AM  Result Value Ref Range Status   Specimen Description BLOOD BLOOD LEFT HAND  Final   Special Requests IN PEDIATRIC BOTTLE 3CC  Final   Culture   Final    NO GROWTH 5 DAYS Performed at [facility]    Report Status 05/16/2015 FINAL  Final  Culture, body fluid-bottle     Status: None   Collection Time: 05/12/15 12:43 PM  Result Value Ref Range Status   Specimen Description FLUID PERITONEAL  Final   Special Requests NONE  Final   Culture   Final    NO GROWTH 5 DAYS Performed at [facility]    Report Status 05/17/2015 FINAL  Final  Gram stain     Status: None   Collection Time: 05/12/15 12:43 PM  Result Value Ref Range Status   Specimen Description FLUID PERITONEAL  Final   Special Requests NONE  Final   Gram Stain   Final    FEW WBC PRESENT, PREDOMINANTLY MONONUCLEAR NO ORGANISMS SEEN Performed at [facility]    Report Status 05/12/2015 FINAL  Final  C difficile quick scan w PCR reflex     Status: None   Collection Time: 05/18/15  8:38 AM  Result Value Ref Range Status   C Diff antigen NEGATIVE NEGATIVE Final   C Diff toxin NEGATIVE NEGATIVE Final   C Diff interpretation Negative for toxigenic C. difficile  Final  Urine culture     Status: None   Collection Time: 05/19/15 12:27 PM  Result Value Ref Range Status   Specimen Description URINE, CATHETERIZED  Final   Special Requests NONE  Final   Culture   Final    NO GROWTH 1 DAY Performed at [facility]    Report Status 05/20/2015 FINAL  Final  Studies: Dg Chest Port 1 View  05/20/2015  CLINICAL DATA:  65 year old female, respiratory failure and hypoxia. Vomiting, possible aspiration. Severe pancreatitis, pancreatic necrosis. Initial encounter. EXAM: PORTABLE CHEST 1 VIEW COMPARISON:  05/18/2015 and earlier. FINDINGS: Portable AP semi upright view at 0844 hours. Enteric feeding tube in place, courses to the mid abdomen and tip not included. Stable right PICC line. Stable cardiac size and mediastinal contours. Continued bilateral lower lobe consolidation. Low lung volumes. No pneumothorax, pulmonary edema, or definite effusion. IMPRESSION: 1. Continued bilateral lower lobe consolidation which could be related to aspiration pneumonia. No definite pleural effusion. 2. Low lung volumes. 3.  Stable lines and tubes. Electronically Signed   By: Genevie Judah M.D.   On: 05/20/2015 09:00    Scheduled Meds: . antiseptic oral rinse  7 mL Mouth Rinse q12n4p  . aspirin  81 mg Oral Daily  . calcitonin  100  Units Intramuscular Daily  . chlorhexidine  15 mL Mouth Rinse BID  . famotidine (PEPCID) IV  20 mg Intravenous Q24H  . heparin  5,000 Units Subcutaneous 3 times per day  . insulin aspart  0-20 Units Subcutaneous 6 times per day  . insulin glargine  20 Units Subcutaneous BID  . meropenem (MERREM) IV  1 g Intravenous Q12H  . metoprolol  5 mg Intravenous 4 times per day  . sodium chloride  10-40 mL Intracatheter Q12H  . sodium chloride  3 mL Intravenous Q12H   Continuous Infusions: . TPN (CLINIMIX) Adult without lytes 70 mL/hr at 05/21/15 0600   And  . fat emulsion 240 mL (05/20/15 1815)  . feeding supplement (VITAL 1.5 CAL) 1,000 mL (05/21/15 0604)  .  sodium bicarbonate  infusion 1000 mL 75 mL/hr at 05/21/15 0600    Principal Problem:   Acute necrotizing pancreatitis with SIRS Active Problems:   Gallstones   Transaminitis   DM type 2 (diabetes mellitus, type 2) (HCC)   Hyperlipidemia   ARF (acute renal failure) (HCC)   Acute respiratory failure with hypoxia (HCC)   Aspiration pneumonia due to vomit (HCC)   Acute on chronic respiratory failure (HCC)   Protein-calorie malnutrition, moderate (HCC)   Umbilical hernia   Chronic kidney disease (CKD), stage III (moderate)   Olivopontocerebellar atrophy (HCC)   Abscess   Fever   Necrotizing pancreatitis   Acute respiratory distress (HCC)     Niel Hummer A MD  [facility] Pager [phone] If 7PM-7AM, please contact night-coverage at [email or web address], password [facility] 05/21/2015, 8:18 AM  LOS: 23 days

## 2015-05-21 NOTE — Progress Notes (Addendum)
PARENTERAL NUTRITION CONSULT NOTE - FOLLOW-UP  Pharmacy Consult for TPN Indication: severe necrotizing pancreatitis with ileus  No Known Allergies  Patient Measurements: Height: 5' 2"  (157.5 cm) Weight: 141 lb 12.1 oz (64.3 kg) IBW/kg (Calculated) : 50.1  Vital Signs: Temp: 99.8 F (37.7 C) (01/24 0800) Temp Source: Oral (01/24 0800) BP: 143/88 mmHg (01/24 0600) Pulse Rate: 127 (01/24 0600) Intake/Output from previous day: 01/23 0701 - 01/24 0700 In: 3816.3 [I.V.:1616.3; NG/GT:230; IV Piggyback:250; TPN:1720] Out: 3600 [Urine:3600]    Labs:  Recent Labs  05/19/15 0456 05/20/15 0500 05/21/15 0420  WBC 16.2* 16.3* 14.4*  HGB 9.3* 9.1* 8.5*  HCT 28.3* 27.5* 26.3*  PLT 584* 513* 527*     Recent Labs  05/19/15 0456 05/19/15 0930 05/20/15 0500 05/21/15 0420  NA 143  --  141 145  K 3.6  --  3.7 3.5  CL 112*  --  113* 113*  CO2 20*  --  18* 24  GLUCOSE 126*  --  111* 134*  BUN 32*  --  32* 29*  CREATININE 0.99  --  0.98 1.01*  CALCIUM 9.6  --  10.3 9.7  MG 1.8  --  2.0 1.5*  PHOS 2.5  --  3.9 2.1*  PROT  --   --  6.0* 6.1*  ALBUMIN  --  1.6* 1.6* 1.6*  AST  --   --  42* 56*  ALT  --   --  45 48  ALKPHOS  --   --  294* 317*  BILITOT  --   --  0.9 0.6  PREALBUMIN  --   --  10.1*  --   TRIG  --   --  141  --    Estimated Creatinine Clearance: 48.3 mL/min (by C-G formula based on Cr of 1.01).    Recent Labs  05/20/15 2346 05/21/15 0420 05/21/15 0732  GLUCAP 121* 123* 147*    Medical History: Past Medical History  Diagnosis Date  . Olivopontocerebellar atrophy (HCC)     Mgmt Dr Brett Fairy  . Diabetes mellitus     Type II. Insulin dependent. Diabetic nephropathy.    Insulin Requirements:   Lantus: 20 units BID Regular: 90 units/day in TPN Novolog: 20 units resistant SSI last 24hrs ( 21 units in last 24 hours)  Current Nutrition:  Diet: NPO Tube feeds: Vital 1.5c @ 10 ml/hr (provides 16g protein, 355 kcal) -- 1/2 Per RD, will attempt to  advance to 20 ml/hr if tolerating  IVF: D5% with Sodium Bicarb 150 meq at 40 ml/hr, plan to DC by 1/25  Central access: PICC per IR TPN start date: 1/9  ASSESSMENT                                                                                                          HPI:  37 YOF presents with N/V and abdominal pain on 1/1, found to have gallstone pancreatitis.  CT revealing necrotizing pancreatitis. Surgery note form 1/8 states severe abdominal distention and ileus.  Pharmacy asked to start TPN on 1/7 but  PICC has not been able to be placed by IV team.  Plans for IR to place PICC. Of note, she has DM on lantus and metformin prior to admission as well as hyperlipidemia on statin therapy.   Significant events:   1/13: NG removed.  Continues on D5W despite glucose d/t chloride elevated.  Discussed with MD and patient needs continued fluid replacement  1/15: US paracentesis  1/16: D5W dc'd and Lantus increased to help control CBGs  1/17: TPN at goal. Added D5W w/ KCl at a low rate to prevent hypoglycemia.  1/19 IVF changed to bicarb with KCl (No other source of K with electrolyte-free TPN.)  1/20 post-pyloric feeding tube placed, begin trickle tube feeds.  1/21 Transfuse 1 unit PRBC for anemia.  Changed NaBicarb fluids to 0.45% NaCl for hypernatremia.  Stool x3 (mucousy-green diarrhea per RN, negative for Cdiff)  1/23 Spiking fevers, tachypnic - CXR and ABG pending, CCM to see  Today, 05/21/2015:   Glucose - CBGs now at goal of <150 (Range 122-150) with no hypoglycemia last 24hrs.    Electrolytes - Cl remains slightly elevated but stable, K+ at low end of normal.   Mg low at 1.5, Phos low at 2.1. On bicarb drip. Na improved to WNL.   Corr Ca 11.6  (PTH is WNL; Calcitonin given x 2 doses on 1/19-1/20, repeat 1/23-1/24).  Ca x Phos product is  < 55)  Renal - SCr improved, UOP remains adequate  LFTs - Alk Phos trending up, AST/ALT and Tbili improving.  Albumin low/stable at 1.6    TGs - wnl (1/16), 141 (1/23)  Prealbumin - decreased to 5.3 (1/16), 1/23: 10.1  NUTRITIONAL GOALS                                                                                           RD recs (updated 1/19): 75-85 g/day protein, 1600-1800 Kcal/day  Clinimix 5/15 at a goal rate of 70 ml/hr + 20% fat emulsion at 10 ml/hr to provide: 84 g/day protein, 1672 Kcal/day.  PLAN                                                                                                                          At 1800 today:  Cont Clinimix 5/15 at 70 ml/hr (goal rate) - NO electrolytes d/t hypercalcemia  Continue  insulin at 90 units/24 hr in TPN as CBGs have trended down quickly and would like to avoid hypoglycemia  TPN to contain standard multivitamins and trace elements.  Cont 20% fat emulsion at 10 ml/hr.  Cont Lantus 20 units q12h  KPhos 10 mmol  IV  Magnesium 2 gr IV x1   Maintenance IVF per MD  Continue resistant SSI q4h.  TPN lab panels on Mondays & Thursdays.    BMET, Phos, Magnesium with AM labs  F/u plans for advancement of tube feeds.  Royetta Asal, PharmD, BCPS Pager [phone] 05/21/2015 11:05 AM

## 2015-05-21 NOTE — Progress Notes (Signed)
Nutrition Follow-up  DOCUMENTATION CODES:   Not applicable  INTERVENTION:  -Will change Vital 1.5 to 56mL/hr  -MD monitor Mg, Phos, K for at least 3 days, replete as necessary. Currently Mg & Phos are low - possible refeeding syndrome.  -TPN per pharmacy -Vital 1.5 @ 20 mL/hr provides 720 calories, 32g protein, 366cc free H2O   NUTRITION DIAGNOSIS:   Inadequate oral intake related to inability to eat as evidenced by NPO status.  ongoing  GOAL:   Patient will meet greater than or equal to 90% of their needs  Exceeding w/ TPN  MONITOR:   Labs, Weight trends, TF tolerance, Skin, I & O's, Other (Comment) (TPN)  REASON FOR ASSESSMENT:   Consult Enteral/tube feeding initiation and management  ASSESSMENT:   Victoria Morrow is a 65 y.o. female with a past medical history of type 2 diabetes on insulin, chronic kidney disease of unknown stage, hyperlipidemia, who was in her usual state of health till earlier today when she started developing nausea followed by vomiting. She also started having upper abdominal pain. Symptoms got worse and she decided to come into the hospital for further evaluation. She denies having had similar symptoms in the past. Pain is located in the upper abdomen without any radiation. It's achy pain 10 out of 10 in intensity. No aggravating or relieving factors. No known precipitant factors. Denies any recent antibiotic use. No sick contacts. No recent hospitalization. Denies any fever but has had chills. Denies any blood in the emesis or in the stool.  Plan per pharmacy 1/24 Clinimix 5/15 at a goal rate of 70 ml/hr + 20% fat emulsion at 10 ml/hr to provide: 84 g/day protein, 1672 Kcal/day.  Combined with Vital 1.5 @ 26mL/hr provides 2392 calories (133% estimated needs), 116g protein (136% needs)  Spoke with pharmacist, will decrease TPN tomorrow if pt tolerates TF @ 33mL/hr.  Per NP note, pt will be stabilized, followed by Cholecystectomy.  Labs: CBGs  121-140, BUN 29, Cr 1.01, Ca 10.5, Phos 2.1, Mg 1.5  Follow for TF tolerance.   Diet Order:  Diet NPO time specified Except for: Ice Chips TPN [facility]) Adult without lytes TPN (CLINIMIX) Adult without lytes  Skin:  Reviewed, no issues  Last BM:  1/22  Height:   Ht Readings from Last 1 Encounters:  05/01/15  (1.575 m)    Weight:   Wt Readings from Last 1 Encounters:  05/21/15 141 lb 12.1 oz (64.3 kg)    Ideal Body Weight:  50 kg  BMI:  Body mass index is 25.92 kg/(m^2).  Estimated Nutritional Needs:   Kcal:  1600-1800  Protein:  75-85g  Fluid:  1.8 - 2L/day  EDUCATION NEEDS:   No education needs identified at this time  Dionne Ano. Darlisha Kelm, MS, RD LDN After Hours/Weekend Pager [phone]

## 2015-05-21 NOTE — Progress Notes (Signed)
Pharmacy Antibiotic Follow-up Note  Victoria Morrow is a 65 y.o. year-old female admitted on 04/28/2015.  The patient is currently on day 24 of antibiotics and specifically day 20 of meropenem for pancreatitis.  Assessment/Plan:  Tmax 99.7, WBC remain elevated but improved at 14.4, SCr stable at 1.01 w/ CrCl ~ 48 ml/min  Continue  meropenem 1g IV q12h based on renal function.  Temp (24hrs), Avg:99.2 F (37.3 C), Min:98.4 F (36.9 C), Max:100.5 F (38.1 C)   Recent Labs Lab 05/17/15 0500 05/18/15 0520 05/19/15 0456 05/20/15 0500 05/21/15 0420  WBC 17.2* 15.8* 16.2* 16.3* 14.4*     Recent Labs Lab 05/17/15 0500 05/18/15 0520 05/19/15 0456 05/20/15 0500 05/21/15 0420  CREATININE 0.93 1.12* 0.99 0.98 1.01*   Estimated Creatinine Clearance: 48.3 mL/min (by C-G formula based on Cr of 1.01).    No Known Allergies  Antimicrobials this admission: 1/1 >> Primaxin  >> 1/5 1/3 >> Rocephin >> 1/4 1/3 >> Flagyl >> 1/5 1/5 >> meropenem >>  1/6 >> PO vanc >> 1/7 1/15 >> Fluconazole >> 1/19 (For yeast in UA)  Levels/dose changes this admission: 1/20 Increase meropenem to 1g q8h for improved renal fxn.  Microbiology results: 1/1 BCx: NGF 1/1 MRSA PCR: neg 1/2 urine: >100k Klebsiella pneumo (R-amp) 1/5 blood: 1/2 NGF, other canceled 1/5 trach asp: NGF 1/6 C diff: neg 1/14 blood x2: NGF 1/14 UA: yeast 1/15 peritoneal fluid: NGF 1/21 Cdiff PCR:   Thank you for allowing pharmacy to be a part of this patient's care.  Adalberto Cole, PharmD, BCPS Pager [phone] 05/21/2015 1:10 PM

## 2015-05-22 DIAGNOSIS — R509 Fever, unspecified: Secondary | ICD-10-CM

## 2015-05-22 DIAGNOSIS — E440 Moderate protein-calorie malnutrition: Secondary | ICD-10-CM

## 2015-05-22 LAB — GLUCOSE, CAPILLARY
GLUCOSE-CAPILLARY: 181 mg/dL — AB (ref 65–99)
GLUCOSE-CAPILLARY: 160 mg/dL — AB (ref 65–99)
GLUCOSE-CAPILLARY: 155 mg/dL — AB (ref 65–99)
Glucose-Capillary: 138 mg/dL — ABNORMAL HIGH (ref 65–99)
Glucose-Capillary: 141 mg/dL — ABNORMAL HIGH (ref 65–99)

## 2015-05-22 LAB — BASIC METABOLIC PANEL
ANION GAP: 9 (ref 5–15)
BUN: 30 mg/dL — AB (ref 6–20)
CO2: 23 mmol/L (ref 22–32)
Calcium: 8.9 mg/dL (ref 8.9–10.3)
Chloride: 110 mmol/L (ref 101–111)
Creatinine, Ser: 1.01 mg/dL — ABNORMAL HIGH (ref 0.44–1.00)
GFR calc Af Amer: >60 mL/min (ref >60)
GFR calc non Af Amer: 57 mL/min — ABNORMAL LOW (ref >60)
GLUCOSE: 151 mg/dL — AB (ref 65–99)
POTASSIUM: 3.5 mmol/L (ref 3.5–5.1)
Sodium: 142 mmol/L (ref 135–145)

## 2015-05-22 LAB — MAGNESIUM: Magnesium: 1.9 mg/dL (ref 1.7–2.4)

## 2015-05-22 LAB — PHOSPHORUS: Phosphorus: 2.6 mg/dL (ref 2.5–4.6)

## 2015-05-22 MED ORDER — FAT EMULSION 20 % IV EMUL
120.0000 mL | INTRAVENOUS | Status: AC
Start: 2015-05-22 — End: 2015-05-23
  Administered 2015-05-22: 120 mL via INTRAVENOUS
  Filled 2015-05-22: qty 250

## 2015-05-22 MED ORDER — TRACE MINERALS CR-CU-MN-SE-ZN 10-1000-500-60 MCG/ML IV SOLN
INTRAVENOUS | Status: AC
  Administered 2015-05-22: 19:00:00 via INTRAVENOUS
  Filled 2015-05-22: qty 960

## 2015-05-22 MED ORDER — ALTEPLASE 100 MG IV SOLR
2.0000 mg | Freq: Once | INTRAVENOUS | Status: AC
  Administered 2015-05-22: 2 mg
  Filled 2015-05-22: qty 2

## 2015-05-22 MED ORDER — POTASSIUM PHOSPHATES 15 MMOLE/5ML IV SOLN
10.0000 mmol | Freq: Once | INTRAVENOUS | Status: AC
  Administered 2015-05-22: 10 mmol via INTRAVENOUS
  Filled 2015-05-22: qty 3.33

## 2015-05-22 MED ORDER — INSULIN GLARGINE 100 UNIT/ML ~~LOC~~ SOLN
25.0000 [IU] | Freq: Two times a day (BID) | SUBCUTANEOUS | Status: AC
  Administered 2015-05-22 – 2015-05-23 (×3): 25 [IU] via SUBCUTANEOUS
  Filled 2015-05-22 (×3): qty 0.25

## 2015-05-22 NOTE — Progress Notes (Signed)
Nutrition Follow-up  INTERVENTION:   MD monitor Mg, Phos, K for at least 3 days, replete as necessary. Currently Mg & Phos are low - possible refeeding syndrome.  -TPN per Pharmacy -Continue Vital 1.5 @ 20 ml/hr. Slow advancement -Will monitor for tolerance for possible advancement 1/26.  NUTRITION DIAGNOSIS:   Inadequate oral intake related to inability to eat as evidenced by NPO status.  Ongoing.  GOAL:   Patient will meet greater than or equal to 90% of their needs  Meeting with TPN and TF  MONITOR:   Labs, Weight trends, TF tolerance, Skin, I & O's, Other (Comment) (TPN)   ASSESSMENT:   SYBLE PICCO is a 65 y.o. female with a past medical history of type 2 diabetes on insulin, chronic kidney disease of unknown stage, hyperlipidemia, who was in her usual state of health till earlier today when she started developing nausea followed by vomiting. She also started having upper abdominal pain. Symptoms got worse and she decided to come into the hospital for further evaluation. She denies having had similar symptoms in the past. Pain is located in the upper abdomen without any radiation. It's achy pain 10 out of 10 in intensity. No aggravating or relieving factors. No known precipitant factors. Denies any recent antibiotic use. No sick contacts. No recent hospitalization. Denies any fever but has had chills. Denies any blood in the emesis or in the stool.  Patient receiving TPN: Clinimix 5/15 @ 70 ml/hr. Tube feeding was advanced to 20 ml/hr. Patient is tolerating with no issues.  Plan is for TPN to decrease today per Pharmacy. TPN +TF rate is providing patient with 1641 kcal and 80g of protein, 100% of needs.  Plan per Pharmacy: At 1800 today:  Decrease Clinimix 5/15 at 40 ml/hr   This will provide 48 gr of protein and a total of 921 calories in combination with fat emulsion. With TF at 20 ml/hr, this will provide a total of 80 grams of protein and a total of 1641 calories  per day.  Decrease insulin at 51 units/24 hr in TPN as CBGs proportionally per rate decrease.   TPN to contain standard multivitamins and trace elements.  Decrease 20% fat emulsion at 5 ml/hr.  Labs reviewed: CBGs: 138-181 Mg/Phos WNL  Diet Order:  Diet NPO time specified Except for: Ice Chips TPN [facility]) Adult without lytes TPN (CLINIMIX) Adult without lytes  Skin:  Reviewed, no issues  Last BM:  1/25  Height:   Ht Readings from Last 1 Encounters:  05/01/15  (1.575 m)    Weight:   Wt Readings from Last 1 Encounters:  05/22/15 144 lb 6.4 oz (65.5 kg)    Ideal Body Weight:  50 kg  BMI:  Body mass index is 26.4 kg/(m^2).  Estimated Nutritional Needs:   Kcal:  1600-1800  Protein:  75-85g  Fluid:  1.8 - 2L/day  EDUCATION NEEDS:   No education needs identified at this time  Tilda Franco, MS, RD, LDN Pager: [phone] After Hours Pager: [phone]

## 2015-05-22 NOTE — Progress Notes (Signed)
[facility] PROGRESS NOTE  Victoria Morrow BMW:[id number] DOB: 1951-02-21 DOA: 04/28/2015  PCP: Rachell Cipro, MD  Brief HPI:  65 year old African-American female with a past medical history of type 2 diabetes on insulin, chronic kidney disease of unknown stage, presented with abdominal pain, nausea and vomiting. She was found to have severe pancreatitis thought to be biliary in origin. She was hospitalized for further management. Critical care medicine was consulted.  1/4 vomited, aspirated, intubated, shock Required neo since 1/7, transferred care under critical care 1/5 extubated 1/7 SVT requiring cardizem gtt 1/8 Vomiting - NG to LIS 1/12 off cardizem gtt 1/14 care transferred to [facility], with critical care and general surgery following .Marland Kitchen  Patient getting treatment for pancreatitis, pancreatic necrosis. She is on IV antibiotics, WBC fluctuates, spikes fevers. Surgery following planning to repeat CT scan at end of week. She was started on tube feeding, low rate.   Past medical history:  Past Medical History  Diagnosis Date  . Olivopontocerebellar atrophy (HCC)     Mgmt Dr Brett Fairy  . Diabetes mellitus     Type II. Insulin dependent. Diabetic nephropathy.    Consultants:   General Surgery  Urology  CCM  Procedures:   Intubation /extubation/ng  Central line  tpn  US guided para on 1/15  Antibiotics: Anti-infectives    Start     Dose/Rate Route Frequency Ordered Stop   05/18/15 2200  meropenem (MERREM) 1 g in sodium chloride 0.9 % 100 mL IVPB     1 g 200 mL/hr over 30 Minutes Intravenous Every 12 hours 05/18/15 0859     05/17/15 1800  meropenem (MERREM) 1 g in sodium chloride 0.9 % 100 mL IVPB  Status:  Discontinued     1 g 200 mL/hr over 30 Minutes Intravenous Every 8 hours 05/17/15 1022 05/18/15 0859   05/13/15 1000  fluconazole (DIFLUCAN) IVPB 100 mg     100 mg 50 mL/hr over 60 Minutes Intravenous Every 24 hours 05/12/15 0813 05/16/15 1300   05/12/15 1000  fluconazole (DIFLUCAN) IVPB 200 mg     200 mg 100 mL/hr over 60 Minutes Intravenous  Once 05/12/15 0813 05/12/15 1029   05/08/15 2200  meropenem (MERREM) 1 g in sodium chloride 0.9 % 100 mL IVPB  Status:  Discontinued     1 g 200 mL/hr over 30 Minutes Intravenous Every 12 hours 05/08/15 1054 05/17/15 1022   05/03/15 1200  vancomycin (VANCOCIN) 50 mg/mL oral solution 500 mg  Status:  Discontinued     500 mg Oral 4 times per day 05/03/15 0917 05/04/15 1306   05/02/15 1200  cefTRIAXone (ROCEPHIN) 1 g in dextrose 5 % 50 mL IVPB  Status:  Discontinued     1 g 100 mL/hr over 30 Minutes Intravenous Every 24 hours 05/01/15 1448 05/01/15 2004   05/02/15 0600  imipenem-cilastatin (PRIMAXIN) 250 mg in sodium chloride 0.9 % 100 mL IVPB  Status:  Discontinued     250 mg 200 mL/hr over 30 Minutes Intravenous 3 times per day 05/01/15 2025 05/02/15 0027   05/02/15 0030  meropenem (MERREM) 500 mg in sodium chloride 0.9 % 50 mL IVPB  Status:  Discontinued     500 mg 100 mL/hr over 30 Minutes Intravenous Every 12 hours 05/02/15 0027 05/08/15 1054   05/01/15 2030  imipenem-cilastatin (PRIMAXIN) 250 mg in sodium chloride 0.9 % 100 mL IVPB     250 mg 200 mL/hr over 30 Minutes Intravenous NOW 05/01/15 2025 05/01/15 2110   04/30/15 1100  metroNIDAZOLE (FLAGYL) IVPB 500 mg  Status:  Discontinued     500 mg 100 mL/hr over 60 Minutes Intravenous Every 12 hours 04/30/15 0951 05/02/15 0751   04/30/15 1000  cefTRIAXone (ROCEPHIN) 1 g in dextrose 5 % 50 mL IVPB  Status:  Discontinued     1 g 100 mL/hr over 30 Minutes Intravenous Every 24 hours 04/30/15 0939 05/01/15 1205   04/30/15 0945  cefTRIAXone (ROCEPHIN) injection 1 g  Status:  Discontinued     1 g Intramuscular Every 24 hours 04/30/15 0930 04/30/15 0938   04/29/15 0200  imipenem-cilastatin (PRIMAXIN) 250 mg in sodium chloride 0.9 % 100 mL IVPB  Status:  Discontinued     250 mg 200 mL/hr over 30 Minutes Intravenous Every 8 hours 04/28/15 1812  04/30/15 0930   04/28/15 1830  imipenem-cilastatin (PRIMAXIN) 250 mg in sodium chloride 0.9 % 100 mL IVPB     250 mg 200 mL/hr over 30 Minutes Intravenous  Once 04/28/15 1812 04/28/15 1904       Subjective: Patient states that she feels better overall. Denies any abdominal pain. No nausea, vomiting. Feels weak. Not very communicative.  Objective: Vital Signs  Filed Vitals:   05/22/15 0422 05/22/15 0600 05/22/15 0800 05/22/15 1000  BP:  121/62 115/55 120/66  Pulse:  105 114 114  Temp:   99.4 F (37.4 C)   TempSrc:   Oral   Resp:  44 29 34  Height:      Weight: 65.5 kg (144 lb 6.4 oz)     SpO2:  98% 99% 99%    Intake/Output Summary (Last 24 hours) at 05/22/15 1030 Last data filed at 05/22/15 0801  Gross per 24 hour  Intake 3532.33 ml  Output   2900 ml  Net 632.33 ml   Filed Weights   05/19/15 0500 05/21/15 0426 05/22/15 0422  Weight: 64.7 kg (142 lb 10.2 oz) 64.3 kg (141 lb 12.1 oz) 65.5 kg (144 lb 6.4 oz)    General appearance: alert, cooperative, appears stated age and no distress Resp: Diminished air entry at the bases. No crackles, wheezing. No rhonchi. Cardio: regular rate and rhythm, S1, S2 normal, no murmur, click, rub or gallop GI: Abdomen is mildly distended. Nontender. Heart sounds are sluggish but present. No masses or organomegaly. Extremities: Minimal edema bilateral lower extremities. Neurologic: No focal deficits. She is alert and oriented 3. Moving all her extremities.  Lab Results:  Basic Metabolic Panel:  Recent Labs Lab 05/18/15 0520 05/19/15 0456 05/20/15 0500 05/21/15 0420 05/22/15 0430  NA 146* 143 141 145 142  K 3.8 3.6 3.7 3.5 3.5  CL 112* 112* 113* 113* 110  CO2 24 20* 18* 24 23  GLUCOSE 150* 126* 111* 134* 151*  BUN 37* 32* 32* 29* 30*  CREATININE 1.12* 0.99 0.98 1.01* 1.01*  CALCIUM 9.5 9.6 10.3 9.7 8.9  MG 1.8 1.8 2.0 1.5* 1.9  PHOS 1.9* 2.5 3.9 2.1* 2.6   Liver Function Tests:  Recent Labs Lab 05/16/15 0515  05/19/15 0930 05/20/15 0500 05/21/15 0420  AST 58*  --  42* 56*  ALT 48  --  45 48  ALKPHOS 236*  --  294* 317*  BILITOT 1.3*  --  0.9 0.6  PROT 6.3*  --  6.0* 6.1*  ALBUMIN 1.6* 1.6* 1.6* 1.6*   CBC:  Recent Labs Lab 05/17/15 0500 05/18/15 0520 05/19/15 0456 05/20/15 0500 05/21/15 0420  WBC 17.2* 15.8* 16.2* 16.3* 14.4*  NEUTROABS  --   --   --  13.3*  --   HGB 7.9* 7.7* 9.3* 9.1* 8.5*  HCT 23.9* 22.9* 28.3* 27.5* 26.3*  MCV 85.4 84.2 86.0 85.7 88.3  PLT 596* 594* 584* 513* 527*    CBG:  Recent Labs Lab 05/21/15 1524 05/21/15 1941 05/21/15 2323 05/22/15 0415 05/22/15 0801  GLUCAP 130* 173* 115* 138* 181*    Recent Results (from the past 240 hour(s))  Culture, body fluid-bottle     Status: None   Collection Time: 05/12/15 12:43 PM  Result Value Ref Range Status   Specimen Description FLUID PERITONEAL  Final   Special Requests NONE  Final   Culture   Final    NO GROWTH 5 DAYS Performed at [facility]    Report Status 05/17/2015 FINAL  Final  Gram stain     Status: None   Collection Time: 05/12/15 12:43 PM  Result Value Ref Range Status   Specimen Description FLUID PERITONEAL  Final   Special Requests NONE  Final   Gram Stain   Final    FEW WBC PRESENT, PREDOMINANTLY MONONUCLEAR NO ORGANISMS SEEN Performed at [facility]    Report Status 05/12/2015 FINAL  Final  C difficile quick scan w PCR reflex     Status: None   Collection Time: 05/18/15  8:38 AM  Result Value Ref Range Status   C Diff antigen NEGATIVE NEGATIVE Final   C Diff toxin NEGATIVE NEGATIVE Final   C Diff interpretation Negative for toxigenic C. difficile  Final  Urine culture     Status: None   Collection Time: 05/19/15 12:27 PM  Result Value Ref Range Status   Specimen Description URINE, CATHETERIZED  Final   Special Requests NONE  Final   Culture   Final    NO GROWTH 1 DAY Performed at [facility]    Report Status 05/20/2015 FINAL  Final       Studies/Results: No results found.  Medications:  Scheduled: . antiseptic oral rinse  7 mL Mouth Rinse q12n4p  . aspirin  81 mg Oral Daily  . chlorhexidine  15 mL Mouth Rinse BID  . famotidine (PEPCID) IV  20 mg Intravenous Q24H  . feeding supplement (VITAL 1.5 CAL)  1,000 mL Per Tube Q24H  . heparin  5,000 Units Subcutaneous 3 times per day  . insulin aspart  0-20 Units Subcutaneous 6 times per day  . insulin glargine  25 Units Subcutaneous BID  . meropenem (MERREM) IV  1 g Intravenous Q12H  . metoprolol  5 mg Intravenous 4 times per day  . potassium phosphate IVPB (mmol)  10 mmol Intravenous Once  . sodium chloride  10-40 mL Intracatheter Q12H  . sodium chloride  3 mL Intravenous Q12H   Continuous: . TPN (CLINIMIX) Adult without lytes     And  . fat emulsion    . TPN (CLINIMIX) Adult without lytes 70 mL/hr at 05/22/15 0600   And  . fat emulsion 240 mL (05/22/15 0600)  .  sodium bicarbonate  infusion 1000 mL 40 mL/hr at 05/22/15 0600   IHK:VQQVZDGLOVFIE (TYLENOL) oral liquid 160 mg/5 mL, [DISCONTINUED] acetaminophen **OR** acetaminophen, bismuth subsalicylate, diphenhydrAMINE, fentaNYL (SUBLIMAZE) injection, hydrALAZINE, levalbuterol, metoprolol, ondansetron (ZOFRAN) IV **OR** ondansetron (ZOFRAN) IV, promethazine, sodium chloride  Assessment/Plan:  Principal Problem:   Acute necrotizing pancreatitis with SIRS Active Problems:   Gallstones   Transaminitis   DM type 2 (diabetes mellitus, type 2) (HCC)   Hyperlipidemia   ARF (acute renal failure) (HCC)   Acute respiratory  failure with hypoxia (HCC)   Aspiration pneumonia due to vomit ([place])   Acute on chronic respiratory failure (HCC)   Protein-calorie malnutrition, moderate (HCC)   Umbilical hernia   Chronic kidney disease (CKD), stage III (moderate)   Olivopontocerebellar atrophy (HCC)   Abscess   Fever   Necrotizing pancreatitis   Acute respiratory distress (HCC)    Necrotizing pancreatitis -CT scan with  evidence of gallstones. Patient denies any history of alcohol intake. Ultrasound of the abdomen demonstrates GB thickening with pericholecystic fluid and 78m stone lodged in neck of gallbladder and no Murphy's sign. CBD is normal size.  -General surgery is following. No clear evidence for acute cholecystitis.  -Patient is currently on meropenem, which will be continued. -Patient not able to tolerate MRI of abdomen due to not able to hold breath for longer duration. 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  -CBC is improved today. She continues to have fevers. Continue management as outlined. Plan is for repeat CT scan in the next 24-48 hours. -post pyloric NG tube in place.   Tachypnea/recent aspiration requiring intubation Most likely secondary to acute illness. Patient was intubated in the intensive care unit after she aspirated. Has been stable. Critical care medicine was reconsulted. They're monitoring closely. Patient remained stable.  Fever Fever is most likely secondary to acute severe pancreatitis. And given Rx as mentioned earlier. WBC is slightly better today compared to yesterday. Urine culture from January 22 did not show any growth. Aspirate from paratonia fluid does not show any growth. Blood cultures have been negative so far.IR was consulted for fluids collection drain and culture. UKoreaguided para was done on 1/15. C. difficile was negative. Chest x-ray showed early evidence for aspiration. No new infiltrative processes noted.  Metabolic acidosis Related to pancreatitis. On Bicarb infusion. Improved.  Hypercalcemia Possibly related to pancreatitis. PTH normal , Vitamin D level low. Continue with IV fluids. Patient also received calcitonin. Calcium level is improved.  Anemia Likely anemia of chronic disease. She has been transfused 2 units of blood. Hemoglobin is  stable.  Hypomagnesemia Replaced.  Septic shock Required pressors. Now off pressors. Blood pressure is stable.  VDRF Intubated on 1/14 and extubated on 1/15. Remains stable.  PAF/SVT Required cardizem drip, now on lopressor. [facility]%-60%, dynamic obstruction reported, grade 1 diastolic dysfunction reported. Increased betablocker iv lopressor from q8hrs to q6hr on 1/17. Continue monitor bp and heart rate.  UTI Kleb pneumonia. Finished abx treatment. Repeat cultures were negative.  Urinary retention Urology consulted initially during hospitalization due to difficult foley placed placement. She is found to have grade 2 cystocele and urethral stenosis. Outpatient urology follow-up.  ARF on CKD III Better. Creatinine now back to baseline. Continue with IV fluids. Continue to monitor urine output. Follow labs periodically.   Transaminitis LFTs are improved. Probably related to her necrotizing pancreatitis/sepsis/tpn. Ultrasound of the right upper quadrant as above. Hepatitis panel is negative. Continue to trend LFTs. Alk phosphatase still elevated.   Insulin dependent Diabetes mellitus type 2, poorly controlled, with chronic kidney disease HbA1c is 7.7.CBG's are reasonably well controlled. Continue Lantus. Patient is also getting insulin with TPN.  Nutrition Patient has inadequate oral intake due to inability to eat due to acute. She is getting nutrition via TPN. Patient is also getting tube feedings at a slow rate. Nutrition is as following.  DVT Prophylaxis: Subcutaneous heparin    Code Status: Full code  Family Communication: Discussed with the patient  Disposition Plan: Continue stepdown setting for now.    LOS: 24 days   [place] [facility] Pager [phone] 05/22/2015, 10:30 AM  If 7PM-7AM, please contact night-coverage at [email or web address], password [email or web address]

## 2015-05-22 NOTE — Progress Notes (Signed)
PARENTERAL NUTRITION CONSULT NOTE - FOLLOW-UP  Pharmacy Consult for TPN Indication: severe necrotizing pancreatitis with ileus  No Known Allergies  Patient Measurements: Height: 5' 2"  (157.5 cm) Weight: 144 lb 6.4 oz (65.5 kg) IBW/kg (Calculated) : 50.1  Vital Signs: Temp: 99.4 F (37.4 C) (01/25 0800) Temp Source: Oral (01/25 0800) BP: 115/55 mmHg (01/25 0800) Pulse Rate: 114 (01/25 0800) Intake/Output from previous day: 01/24 0701 - 01/25 0700 In: 3852.7 [I.V.:1020.3; NG/GT:240; IV Piggyback:553.3; TPN:2039] Out: 2600 [Urine:2600] Total I/O In: -  Out: 300 [Stool:300]  Labs:  Recent Labs  05/20/15 0500 05/21/15 0420  WBC 16.3* 14.4*  HGB 9.1* 8.5*  HCT 27.5* 26.3*  PLT 513* 527*     Recent Labs  05/19/15 0930 05/20/15 0500 05/21/15 0420 05/22/15 0430  NA  --  141 145 142  K  --  3.7 3.5 3.5  CL  --  113* 113* 110  CO2  --  18* 24 23  GLUCOSE  --  111* 134* 151*  BUN  --  32* 29* 30*  CREATININE  --  0.98 1.01* 1.01*  CALCIUM  --  10.3 9.7 8.9  MG  --  2.0 1.5* 1.9  PHOS  --  3.9 2.1* 2.6  PROT  --  6.0* 6.1*  --   ALBUMIN 1.6* 1.6* 1.6*  --   AST  --  42* 56*  --   ALT  --  45 48  --   ALKPHOS  --  294* 317*  --   BILITOT  --  0.9 0.6  --   PREALBUMIN  --  10.1*  --   --   TRIG  --  141  --   --    Estimated Creatinine Clearance: 48.7 mL/min (by C-G formula based on Cr of 1.01).    Recent Labs  05/21/15 1941 05/21/15 2323 05/22/15 0415  GLUCAP 173* 115* 138*    Medical History: Past Medical History  Diagnosis Date  . Olivopontocerebellar atrophy (HCC)     Mgmt Dr Brett Fairy  . Diabetes mellitus     Type II. Insulin dependent. Diabetic nephropathy.    Insulin Requirements:   Lantus: 20 units BID Regular: 90 units/day in TPN Novolog: 16 units on resistant SSI last 24hrs   Current Nutrition:  Diet: NPO Tube feeds: Vital 1.5c @ 20 ml/hr (provides 32 g protein, 720 kcal) -- Per surgery, advance to target TF goal per RD  recommendations   IVF: D5% with Sodium Bicarb 150 meq at 40 ml/hr   Central access: PICC per IR TPN start date: 1/9  ASSESSMENT                                                                                                          HPI:  35 YOF presents with N/V and abdominal pain on 1/1, found to have gallstone pancreatitis.  CT revealing necrotizing pancreatitis. Surgery note form 1/8 states severe abdominal distention and ileus.  Pharmacy asked to start TPN on 1/7 but PICC has not been able to be placed by  IV team.  Plans for IR to place PICC. Of note, she has DM on lantus and metformin prior to admission as well as hyperlipidemia on statin therapy.   Significant events:   1/13: NG removed.  Continues on D5W despite glucose d/t chloride elevated.  Discussed with MD and patient needs continued fluid replacement  1/15: US paracentesis  1/16: D5W dc'd and Lantus increased to help control CBGs  1/17: TPN at goal. Added D5W w/ KCl at a low rate to prevent hypoglycemia.  1/19 IVF changed to bicarb with KCl (No other source of K with electrolyte-free TPN.)  1/20 post-pyloric feeding tube placed, begin trickle tube feeds.  1/21 Transfuse 1 unit PRBC for anemia.  Changed NaBicarb fluids to 0.45% NaCl for hypernatremia.  Stool x3 (mucousy-green diarrhea per RN, negative for Cdiff)  1/23 Spiking fevers, tachypnic - CXR and ABG pending, CCM to see  Today, 05/22/2015:   Glucose - CBGs mostly at goal of <150 (Range 115-173) with no hypoglycemia last 24hrs.    Electrolytes - K+ at low end of normal.   Magnesium, Phos improved after infusions yesterday. On bicarb drip. Na, Cl WNL.   Corr Ca 10.8  (PTH is WNL; Calcitonin given x 2 doses on 1/19-1/20, repeat 1/23-1/24).  Ca x Phos product is  < 55.  Renal - SCr stable, UOP remains adequate  LFTs - Alk Phos trending up, AST/ALT and Tbili stable.  Albumin low/stable at 1.6   TGs - wnl (1/16), 141 (1/23)  Prealbumin - decreased to 5.3 (1/16),  10.1 ( 1/23)  NUTRITIONAL GOALS                                                                                           RD recs (updated 1/19): 75-85 g/day protein, 1600-1800 Kcal/day  Clinimix 5/15 at a goal rate of 40 ml/hr + 20% fat emulsion at 5 ml/hr to provide: 48 g/day protein,  921 Kcal/day.  PLAN                                                                                                                          At 1800 today:  Decrease Clinimix 5/15 at 40 ml/hr    This will provide 48 gr of protein and a total of 921 calories in combination with fat emulsion. With TF at 20 ml/hr, this will provide a total of 80 grams of protein and a total of 1641 calories per day.  Decrease insulin at 51 units/24 hr in TPN as CBGs proportionally per rate decrease.   TPN to contain standard multivitamins and trace elements.  Decrease 20%  fat emulsion at 5 ml/hr.  Increase Lantus 25 units q12h. As the total amount of insulin in TPN will decrease yet the total calorie amount will stay the same, will increase Lantus dose. Anticipate that some BG may be elevated compared to previous readings, but will conservatively increase Lantus due to recent history of hypoglycemia.   KPhos 10 mmol IV  Maintenance IVF per MD  Continue resistant SSI q4h.  TPN lab panels on Mondays & Thursdays.    F/u plans for advancement of tube feeds.  Royetta Asal, PharmD, BCPS Pager [phone] 05/22/2015 8:22 AM

## 2015-05-22 NOTE — Progress Notes (Signed)
Patient ID: Victoria Morrow, female   DOB: [birth year], 65 y.o.   MRN: [id number]  General Surgery [facility]  Subjective: Patient awake and responsive, no complaints.  Denies abd pain.  TF's @ 20cc/hr.  Low grade fever last night.  Objective: Vital signs in last 24 hours: Temp:  [99.4 F (37.4 C)-101 F (38.3 C)] 99.4 F (37.4 C) (01/25 0416) Pulse Rate:  [98-120] 105 (01/25 0600) Resp:  [23-48] 44 (01/25 0600) BP: (112-145)/(56-77) 121/62 mmHg (01/25 0600) SpO2:  [95 %-100 %] 98 % (01/25 0600) Weight:  [65.5 kg (144 lb 6.4 oz)] 65.5 kg (144 lb 6.4 oz) (01/25 0422) Last BM Date: 05/21/15  Intake/Output from previous day: 01/24 0701 - 01/25 0700 In: 3852.7 [I.V.:1020.3; NG/GT:240; IV Piggyback:553.3; TPN:2039] Out: 2600 [Urine:2600] Intake/Output this shift:    Physical Exam: HEENT - sclerae clear, mucous membranes moist Neck - soft Chest - coarse bilaterally Cor - mild tachycardia Abdomen - softer, less distended; active BS; "fullness" RUQ, non-tender Ext - no edema, non-tender  Lab Results:   Recent Labs  05/20/15 0500 05/21/15 0420  WBC 16.3* 14.4*  HGB 9.1* 8.5*  HCT 27.5* 26.3*  PLT 513* 527*   BMET  Recent Labs  05/21/15 0420 05/22/15 0430  NA 145 142  K 3.5 3.5  CL 113* 110  CO2 24 23  GLUCOSE 134* 151*  BUN 29* 30*  CREATININE 1.01* 1.01*  CALCIUM 9.7 8.9   PT/INR No results for input(s): LABPROT, INR in the last 72 hours. Comprehensive Metabolic Panel:    Component Value Date/Time   NA 142 05/22/2015 0430   NA 145 05/21/2015 0420   K 3.5 05/22/2015 0430   K 3.5 05/21/2015 0420   CL 110 05/22/2015 0430   CL 113* 05/21/2015 0420   CO2 23 05/22/2015 0430   CO2 24 05/21/2015 0420   BUN 30* 05/22/2015 0430   BUN 29* 05/21/2015 0420   CREATININE 1.01* 05/22/2015 0430   CREATININE 1.01* 05/21/2015 0420   GLUCOSE 151* 05/22/2015 0430   GLUCOSE 134* 05/21/2015 0420   CALCIUM 8.9 05/22/2015 0430   CALCIUM 9.7 05/21/2015  0420   CALCIUM 10.5* 05/16/2015 1010   AST 56* 05/21/2015 0420   AST 42* 05/20/2015 0500   ALT 48 05/21/2015 0420   ALT 45 05/20/2015 0500   ALKPHOS 317* 05/21/2015 0420   ALKPHOS 294* 05/20/2015 0500   BILITOT 0.6 05/21/2015 0420   BILITOT 0.9 05/20/2015 0500   PROT 6.1* 05/21/2015 0420   PROT 6.0* 05/20/2015 0500   ALBUMIN 1.6* 05/21/2015 0420   ALBUMIN 1.6* 05/20/2015 0500    Studies/Results: Dg Chest Port 1 View  05/20/2015  CLINICAL DATA:  65 year old female, respiratory failure and hypoxia. Vomiting, possible aspiration. Severe pancreatitis, pancreatic necrosis. Initial encounter. EXAM: PORTABLE CHEST 1 VIEW COMPARISON:  05/18/2015 and earlier. FINDINGS: Portable AP semi upright view at 0844 hours. Enteric feeding tube in place, courses to the mid abdomen and tip not included. Stable right PICC line. Stable cardiac size and mediastinal contours. Continued bilateral lower lobe consolidation. Low lung volumes. No pneumothorax, pulmonary edema, or definite effusion. IMPRESSION: 1. Continued bilateral lower lobe consolidation which could be related to aspiration pneumonia. No definite pleural effusion. 2. Low lung volumes. 3.  Stable lines and tubes. Electronically Signed   By: Odessa Fleming M.D.   On: 05/20/2015 09:00    Anti-infectives: Anti-infectives    Start     Dose/Rate Route Frequency Ordered Stop   05/18/15 2200  meropenem (MERREM) 1 g in sodium chloride 0.9 % 100 mL IVPB     1 g 200 mL/hr over 30 Minutes Intravenous Every 12 hours 05/18/15 0859     05/17/15 1800  meropenem (MERREM) 1 g in sodium chloride 0.9 % 100 mL IVPB  Status:  Discontinued     1 g 200 mL/hr over 30 Minutes Intravenous Every 8 hours 05/17/15 1022 05/18/15 0859   05/13/15 1000  fluconazole (DIFLUCAN) IVPB 100 mg     100 mg 50 mL/hr over 60 Minutes Intravenous Every 24 hours 05/12/15 0813 05/16/15 1300   05/12/15 1000  fluconazole (DIFLUCAN) IVPB 200 mg     200 mg 100 mL/hr over 60 Minutes Intravenous  Once  05/12/15 0813 05/12/15 1029   05/08/15 2200  meropenem (MERREM) 1 g in sodium chloride 0.9 % 100 mL IVPB  Status:  Discontinued     1 g 200 mL/hr over 30 Minutes Intravenous Every 12 hours 05/08/15 1054 05/17/15 1022   05/03/15 1200  vancomycin (VANCOCIN) 50 mg/mL oral solution 500 mg  Status:  Discontinued     500 mg Oral 4 times per day 05/03/15 0917 05/04/15 1306   05/02/15 1200  cefTRIAXone (ROCEPHIN) 1 g in dextrose 5 % 50 mL IVPB  Status:  Discontinued     1 g 100 mL/hr over 30 Minutes Intravenous Every 24 hours 05/01/15 1448 05/01/15 2004   05/02/15 0600  imipenem-cilastatin (PRIMAXIN) 250 mg in sodium chloride 0.9 % 100 mL IVPB  Status:  Discontinued     250 mg 200 mL/hr over 30 Minutes Intravenous 3 times per day 05/01/15 2025 05/02/15 0027   05/02/15 0030  meropenem (MERREM) 500 mg in sodium chloride 0.9 % 50 mL IVPB  Status:  Discontinued     500 mg 100 mL/hr over 30 Minutes Intravenous Every 12 hours 05/02/15 0027 05/08/15 1054   05/01/15 2030  imipenem-cilastatin (PRIMAXIN) 250 mg in sodium chloride 0.9 % 100 mL IVPB     250 mg 200 mL/hr over 30 Minutes Intravenous NOW 05/01/15 2025 05/01/15 2110   04/30/15 1100  metroNIDAZOLE (FLAGYL) IVPB 500 mg  Status:  Discontinued     500 mg 100 mL/hr over 60 Minutes Intravenous Every 12 hours 04/30/15 0951 05/02/15 0751   04/30/15 1000  cefTRIAXone (ROCEPHIN) 1 g in dextrose 5 % 50 mL IVPB  Status:  Discontinued     1 g 100 mL/hr over 30 Minutes Intravenous Every 24 hours 04/30/15 0939 05/01/15 1205   04/30/15 0945  cefTRIAXone (ROCEPHIN) injection 1 g  Status:  Discontinued     1 g Intramuscular Every 24 hours 04/30/15 0930 04/30/15 0938   04/29/15 0200  imipenem-cilastatin (PRIMAXIN) 250 mg in sodium chloride 0.9 % 100 mL IVPB  Status:  Discontinued     250 mg 200 mL/hr over 30 Minutes Intravenous Every 8 hours 04/28/15 1812 04/30/15 0930   04/28/15 1830  imipenem-cilastatin (PRIMAXIN) 250 mg in sodium chloride 0.9 % 100 mL IVPB      250 mg 200 mL/hr over 30 Minutes Intravenous  Once 04/28/15 1812 04/28/15 1904      Assessment & Plans: Acute biliary pancreatitis with necrosis, cholelithiasis OK to increase TF's to goal per nutritionist recs Wean TNA per pharmacy Empiric abx per medical service Schedule CT abd for tomorrow to follow up pancreatitis, fluid collections Will follow - no role for acute surgical intervention at this point  Velora Heckler, MD, [facility]: [facility] Office: [phone]  Joud Pettinato Judie Petit 05/22/2015

## 2015-05-23 ENCOUNTER — Inpatient Hospital Stay (HOSPITAL_COMMUNITY): Payer: Commercial Managed Care - HMO

## 2015-05-23 LAB — CBC
HCT: 25 % — ABNORMAL LOW (ref 36.0–46.0)
HEMOGLOBIN: 8 g/dL — AB (ref 12.0–15.0)
MCH: 28.4 pg (ref 26.0–34.0)
MCHC: 32 g/dL (ref 30.0–36.0)
MCV: 88.7 fL (ref 78.0–100.0)
PLATELETS: 465 10*3/uL — AB (ref 150–400)
RBC: 2.82 MIL/uL — AB (ref 3.87–5.11)
RDW: 15 % (ref 11.5–15.5)
WBC: 14 10*3/uL — ABNORMAL HIGH (ref 4.0–10.5)

## 2015-05-23 LAB — GLUCOSE, CAPILLARY
GLUCOSE-CAPILLARY: 101 mg/dL — AB (ref 65–99)
GLUCOSE-CAPILLARY: 108 mg/dL — AB (ref 65–99)
GLUCOSE-CAPILLARY: 109 mg/dL — AB (ref 65–99)
GLUCOSE-CAPILLARY: 121 mg/dL — AB (ref 65–99)
Glucose-Capillary: 123 mg/dL — ABNORMAL HIGH (ref 65–99)
Glucose-Capillary: 80 mg/dL (ref 65–99)

## 2015-05-23 LAB — COMPREHENSIVE METABOLIC PANEL
ALK PHOS: 338 U/L — AB (ref 38–126)
ALT: 52 U/L (ref 14–54)
ANION GAP: 13 (ref 5–15)
AST: 67 U/L — ABNORMAL HIGH (ref 15–41)
Albumin: 1.7 g/dL — ABNORMAL LOW (ref 3.5–5.0)
BILIRUBIN TOTAL: 0.7 mg/dL (ref 0.3–1.2)
BUN: 27 mg/dL — ABNORMAL HIGH (ref 6–20)
CALCIUM: 10.1 mg/dL (ref 8.9–10.3)
CO2: 21 mmol/L — ABNORMAL LOW (ref 22–32)
CREATININE: 0.92 mg/dL (ref 0.44–1.00)
Chloride: 112 mmol/L — ABNORMAL HIGH (ref 101–111)
GFR calc non Af Amer: >60 mL/min (ref >60)
Glucose, Bld: 132 mg/dL — ABNORMAL HIGH (ref 65–99)
Potassium: 3.8 mmol/L (ref 3.5–5.1)
Sodium: 146 mmol/L — ABNORMAL HIGH (ref 135–145)
TOTAL PROTEIN: 6.2 g/dL — AB (ref 6.5–8.1)

## 2015-05-23 LAB — MAGNESIUM: Magnesium: 1.8 mg/dL (ref 1.7–2.4)

## 2015-05-23 LAB — PHOSPHORUS: Phosphorus: 3.2 mg/dL (ref 2.5–4.6)

## 2015-05-23 MED ORDER — IOHEXOL 300 MG/ML  SOLN
25.0000 mL | INTRAMUSCULAR | Status: AC
  Administered 2015-05-23 (×2): 25 mL via ORAL

## 2015-05-23 MED ORDER — INSULIN ASPART 100 UNIT/ML ~~LOC~~ SOLN
0.0000 [IU] | SUBCUTANEOUS | Status: DC
  Administered 2015-05-23: 2 [IU] via SUBCUTANEOUS
  Administered 2015-05-24: 3 [IU] via SUBCUTANEOUS
  Administered 2015-05-24: 5 [IU] via SUBCUTANEOUS
  Administered 2015-05-24: 2 [IU] via SUBCUTANEOUS

## 2015-05-23 MED ORDER — RANITIDINE HCL 150 MG/10ML PO SYRP
150.0000 mg | ORAL_SOLUTION | Freq: Every day | ORAL | Status: DC
  Administered 2015-05-23 – 2015-05-24 (×2): 150 mg
  Filled 2015-05-23 (×2): qty 10

## 2015-05-23 MED ORDER — ADULT MULTIVITAMIN LIQUID CH
5.0000 mL | Freq: Every day | ORAL | Status: DC
  Administered 2015-05-24: 5 mL
  Filled 2015-05-23: qty 5

## 2015-05-23 MED ORDER — INSULIN GLARGINE 100 UNIT/ML ~~LOC~~ SOLN
20.0000 [IU] | Freq: Two times a day (BID) | SUBCUTANEOUS | Status: DC
  Administered 2015-05-23 – 2015-05-24 (×2): 20 [IU] via SUBCUTANEOUS
  Filled 2015-05-23 (×3): qty 0.2

## 2015-05-23 MED ORDER — VITAL 1.5 CAL PO LIQD: 1000.0000 mL | ORAL | Status: DC

## 2015-05-23 MED ORDER — IOHEXOL 300 MG/ML  SOLN
100.0000 mL | Freq: Once | INTRAMUSCULAR | Status: AC | PRN
  Administered 2015-05-23: 100 mL via INTRAVENOUS

## 2015-05-23 MED ORDER — VITAL 1.5 CAL PO LIQD
1000.0000 mL | ORAL | Status: DC
  Filled 2015-05-23 (×2): qty 1000

## 2015-05-23 NOTE — Progress Notes (Signed)
Physical Therapy Treatment Patient Details Name: Victoria Morrow MRN: [id number] DOB: 03-21-1951 Today's Date: 05/23/2015    History of Present Illness 65 yo female admitted 04/28/15 with necrotizing pancreatitis. required ventilator  for aspiration. Extubated 1/5.    PT Comments    Patient is much brighter and stronger today. Per RN, she has been up  To [facility]  And recliner. Today she stood with RW and took a few side steps along the bed with mod  Assist of 2. HR 127, RR 30's. sats >95%  Follow Up Recommendations  SNF;Supervision/Assistance - 24 hour     Equipment Recommendations  None recommended by PT    Recommendations for Other Services       Precautions / Restrictions Precautions Precautions: Fall Precaution Comments: multi lines, monitor VS, increased HR and RR    Mobility  Bed Mobility   Bed Mobility: Rolling;Sidelying to Sit;Sit to Sidelying Rolling: Max assist Sidelying to sit: Max assist;+2 for safety/equipment;+2 for physical assistance;HOB elevated     Sit to sidelying: Max assist;+2 for physical assistance;+2 for safety/equipment General bed mobility comments: multimodal cues for rolling, extra time, attempts to push self up with UE, very weak effort.  Transfers Overall transfer level: Needs assistance Equipment used: Rolling walker (2 wheeled) Transfers: Sit to/from Stand Sit to Stand: Mod assist;+2 safety/equipment;+2 physical assistance         General transfer comment: patient stood with  mod assist from bed, maintained  her weight. Took 5 side steps along the bed, good weight bearing, trunk flexed somewhat.   Ambulation/Gait                 Stairs            Wheelchair Mobility    Modified Rankin (Stroke Patients Only)       Balance   Sitting-balance support: Bilateral upper extremity supported;Feet supported   Sitting balance - Comments: sitting on edge of bed today without external support, maintained midline posture.    Standing balance support: Bilateral upper extremity supported;During functional activity Standing balance-Leahy Scale: Poor                      Cognition Arousal/Alertness: Awake/alert Behavior During Therapy: WFL for tasks assessed/performed                   General Comments: much improved  today, interactive, voice is very effortful, halting.    Exercises General Exercises - Lower Extremity Ankle Circles/Pumps: AROM;Both;10 reps Short Arc Quad: AROM;Both;10 reps Heel Slides: AROM;Both;10 reps Hip ABduction/ADduction: AROM;Both;10 reps Straight Leg Raises: AROM;Both;10 reps    General Comments        Pertinent Vitals/Pain Pain Assessment: Faces Faces Pain Scale: Hurts little more Pain Location: abdomen Pain Descriptors / Indicators: Cramping Pain Intervention(s): Monitored during session    Home Living                      Prior Function            PT Goals (current goals can now be found in the care plan section) Acute Rehab PT Goals Patient Stated Goal: to get stronger. PT Goal Formulation: With patient/family Time For Goal Achievement: 06/06/15 Potential to Achieve Goals: Good Progress towards PT goals: Progressing toward goals;Goals downgraded-see care plan    Frequency  Min 3X/week    PT Plan Current plan remains appropriate    Co-evaluation  End of Session   Activity Tolerance: Patient tolerated treatment well Patient left: in bed;with call bell/phone within reach;with bed alarm set;with family/visitor present     Time: 9604-5409 PT Time Calculation (min) (ACUTE ONLY): 16 min  Charges:  $Therapeutic Activity: 8-22 mins                    G Codes:      Rada Hay 05/23/2015, 4:29 PM

## 2015-05-23 NOTE — Progress Notes (Signed)
Chaplain providing continued emotional support with Dewayne Hatch and Onalee Hua.

## 2015-05-23 NOTE — Care Management Note (Signed)
Case Management Note  Patient Details  Name: KAIRA STRINGFIELD MRN: [id number] Date of Birth: 1950-06-26  Subjective/Objective:     pancreatitis               Action/Plan:Date: May 23, 2015 Chart reviewed for concurrent status and case management needs. Will continue to follow patient for changes and needs: continues on iv tna, t feeds at 20cc/hr, low gradetemp hypotensive. Marcelle Smiling, BSN, CCM, RN,   [phone]   Expected Discharge Date:                  Expected Discharge Plan:  Skilled Nursing Facility  In-House Referral:  Clinical Social Work  Discharge planning Services  CM Consult  Post Acute Care Choice:  NA Choice offered to:  NA  DME Arranged:    DME Agency:     HH Arranged:    HH Agency:     Status of Service:  In process, will continue to follow  Medicare Important Message Given:    Date Medicare IM Given:    Medicare IM give by:    Date Additional Medicare IM Given:    Additional Medicare Important Message give by:     If discussed at Long Length of Stay Meetings, dates discussed:  [id number]  Additional Comments:  Golda Acre, RN 05/23/2015, 10:28 AM

## 2015-05-23 NOTE — Progress Notes (Signed)
[facility] PROGRESS NOTE  Victoria Morrow UOH:[id number] DOB: [birth year] DOA: 04/28/2015  PCP: Rachell Cipro, MD  Brief HPI:  65 year old African-American female with a past medical history of type 2 diabetes on insulin, chronic kidney disease of unknown stage, presented with abdominal pain, nausea and vomiting. She was found to have severe pancreatitis thought to be biliary in origin. She was hospitalized for further management. Critical care medicine was consulted.  1/4 vomited, aspirated, intubated, shock Required neo since 1/7, transferred care under critical care 1/5 extubated 1/7 SVT requiring cardizem gtt 1/8 Vomiting - NG to LIS 1/12 off cardizem gtt 1/14 care transferred to [facility], with critical care and general surgery following .Marland Kitchen  Patient getting treatment for pancreatitis, pancreatic necrosis. She is on IV antibiotics, WBC fluctuates, spikes fevers. She was started on tube feeding.   Past medical history:  Past Medical History  Diagnosis Date  . Olivopontocerebellar atrophy (HCC)     Mgmt Dr Brett Fairy  . Diabetes mellitus     Type II. Insulin dependent. Diabetic nephropathy.    Consultants:   General Surgery  Urology  CCM  Procedures:   Intubation /extubation/ng  Central line  tpn  US guided para on 1/15  Antibiotics: Anti-infectives    Start     Dose/Rate Route Frequency Ordered Stop   05/18/15 2200  meropenem (MERREM) 1 g in sodium chloride 0.9 % 100 mL IVPB     1 g 200 mL/hr over 30 Minutes Intravenous Every 12 hours 05/18/15 0859     05/17/15 1800  meropenem (MERREM) 1 g in sodium chloride 0.9 % 100 mL IVPB  Status:  Discontinued     1 g 200 mL/hr over 30 Minutes Intravenous Every 8 hours 05/17/15 1022 05/18/15 0859   05/13/15 1000  fluconazole (DIFLUCAN) IVPB 100 mg     100 mg 50 mL/hr over 60 Minutes Intravenous Every 24 hours 05/12/15 0813 05/16/15 1300   05/12/15 1000  fluconazole (DIFLUCAN) IVPB 200 mg     200 mg 100 mL/hr  over 60 Minutes Intravenous  Once 05/12/15 0813 05/12/15 1029   05/08/15 2200  meropenem (MERREM) 1 g in sodium chloride 0.9 % 100 mL IVPB  Status:  Discontinued     1 g 200 mL/hr over 30 Minutes Intravenous Every 12 hours 05/08/15 1054 05/17/15 1022   05/03/15 1200  vancomycin (VANCOCIN) 50 mg/mL oral solution 500 mg  Status:  Discontinued     500 mg Oral 4 times per day 05/03/15 0917 05/04/15 1306   05/02/15 1200  cefTRIAXone (ROCEPHIN) 1 g in dextrose 5 % 50 mL IVPB  Status:  Discontinued     1 g 100 mL/hr over 30 Minutes Intravenous Every 24 hours 05/01/15 1448 05/01/15 2004   05/02/15 0600  imipenem-cilastatin (PRIMAXIN) 250 mg in sodium chloride 0.9 % 100 mL IVPB  Status:  Discontinued     250 mg 200 mL/hr over 30 Minutes Intravenous 3 times per day 05/01/15 2025 05/02/15 0027   05/02/15 0030  meropenem (MERREM) 500 mg in sodium chloride 0.9 % 50 mL IVPB  Status:  Discontinued     500 mg 100 mL/hr over 30 Minutes Intravenous Every 12 hours 05/02/15 0027 05/08/15 1054   05/01/15 2030  imipenem-cilastatin (PRIMAXIN) 250 mg in sodium chloride 0.9 % 100 mL IVPB     250 mg 200 mL/hr over 30 Minutes Intravenous NOW 05/01/15 2025 05/01/15 2110   04/30/15 1100  metroNIDAZOLE (FLAGYL) IVPB 500 mg  Status:  Discontinued  500 mg 100 mL/hr over 60 Minutes Intravenous Every 12 hours 04/30/15 0951 05/02/15 0751   04/30/15 1000  cefTRIAXone (ROCEPHIN) 1 g in dextrose 5 % 50 mL IVPB  Status:  Discontinued     1 g 100 mL/hr over 30 Minutes Intravenous Every 24 hours 04/30/15 0939 05/01/15 1205   04/30/15 0945  cefTRIAXone (ROCEPHIN) injection 1 g  Status:  Discontinued     1 g Intramuscular Every 24 hours 04/30/15 0930 04/30/15 0938   04/29/15 0200  imipenem-cilastatin (PRIMAXIN) 250 mg in sodium chloride 0.9 % 100 mL IVPB  Status:  Discontinued     250 mg 200 mL/hr over 30 Minutes Intravenous Every 8 hours 04/28/15 1812 04/30/15 0930   04/28/15 1830  imipenem-cilastatin (PRIMAXIN) 250 mg in  sodium chloride 0.9 % 100 mL IVPB     250 mg 200 mL/hr over 30 Minutes Intravenous  Once 04/28/15 1812 04/28/15 1904       Subjective: Patient continues to feel better. Denies any abdominal pain, nausea or vomiting. More communicative today.   Objective: Vital Signs  Filed Vitals:   05/23/15 0200 05/23/15 0400 05/23/15 0500 05/23/15 0600  BP: 122/58 118/62  134/65  Pulse: 108 114  119  Temp:  99.7 F (37.6 C)    TempSrc:  Oral    Resp: 29 31  29   Height:      Weight:   64.4 kg (141 lb 15.6 oz)   SpO2: 98% 100%  100%    Intake/Output Summary (Last 24 hours) at 05/23/15 0816 Last data filed at 05/23/15 0600  Gross per 24 hour  Intake   4148 ml  Output   2025 ml  Net   2123 ml   Filed Weights   05/21/15 0426 05/22/15 0422 05/23/15 0500  Weight: 64.3 kg (141 lb 12.1 oz) 65.5 kg (144 lb 6.4 oz) 64.4 kg (141 lb 15.6 oz)    General appearance: alert, cooperative, appears stated age and no distress Resp: Diminished air entry at the bases. No crackles, wheezing. No rhonchi. Cardio: regular rate and rhythm, S1, S2 normal, no murmur, click, rub or gallop GI: Abdomen is mildly distended. Nontender. Bowel sounds are sluggish but present. No masses or organomegaly. Extremities: Minimal edema bilateral lower extremities. Neurologic: No focal deficits. She is alert and oriented 3. Moving all her extremities.  Lab Results:  Basic Metabolic Panel:  Recent Labs Lab 05/19/15 0456 05/20/15 0500 05/21/15 0420 05/22/15 0430 05/23/15 0445  NA 143 141 145 142 146*  K 3.6 3.7 3.5 3.5 3.8  CL 112* 113* 113* 110 112*  CO2 20* 18* 24 23 21*  GLUCOSE 126* 111* 134* 151* 132*  BUN 32* 32* 29* 30* 27*  CREATININE 0.99 0.98 1.01* 1.01* 0.92  CALCIUM 9.6 10.3 9.7 8.9 10.1  MG 1.8 2.0 1.5* 1.9 1.8  PHOS 2.5 3.9 2.1* 2.6 3.2   Liver Function Tests:  Recent Labs Lab 05/19/15 0930 05/20/15 0500 05/21/15 0420 05/23/15 0445  AST  --  42* 56* 67*  ALT  --  45 48 52  ALKPHOS  --   294* 317* 338*  BILITOT  --  0.9 0.6 0.7  PROT  --  6.0* 6.1* 6.2*  ALBUMIN 1.6* 1.6* 1.6* 1.7*   CBC:  Recent Labs Lab 05/18/15 0520 05/19/15 0456 05/20/15 0500 05/21/15 0420 05/23/15 0445  WBC 15.8* 16.2* 16.3* 14.4* 14.0*  NEUTROABS  --   --  13.3*  --   --   HGB 7.7* 9.3* 9.1*  8.5* 8.0*  HCT 22.9* 28.3* 27.5* 26.3* 25.0*  MCV 84.2 86.0 85.7 88.3 88.7  PLT 594* 584* 513* 527* 465*    CBG:  Recent Labs Lab 05/22/15 1630 05/22/15 1952 05/22/15 2349 05/23/15 0441 05/23/15 0731  GLUCAP 141* 155* 109* 101* 123*    Recent Results (from the past 240 hour(s))  C difficile quick scan w PCR reflex     Status: None   Collection Time: 05/18/15  8:38 AM  Result Value Ref Range Status   C Diff antigen NEGATIVE NEGATIVE Final   C Diff toxin NEGATIVE NEGATIVE Final   C Diff interpretation Negative for toxigenic C. difficile  Final  Urine culture     Status: None   Collection Time: 05/19/15 12:27 PM  Result Value Ref Range Status   Specimen Description URINE, CATHETERIZED  Final   Special Requests NONE  Final   Culture   Final    NO GROWTH 1 DAY Performed at [facility]    Report Status 05/20/2015 FINAL  Final      Studies/Results: No results found.  Medications:  Scheduled: . antiseptic oral rinse  7 mL Mouth Rinse q12n4p  . aspirin  81 mg Oral Daily  . chlorhexidine  15 mL Mouth Rinse BID  . feeding supplement (VITAL 1.5 CAL)  1,000 mL Per Tube Q24H  . heparin  5,000 Units Subcutaneous 3 times per day  . insulin aspart  0-15 Units Subcutaneous 6 times per day  . insulin aspart  0-20 Units Subcutaneous 6 times per day  . insulin glargine  20 Units Subcutaneous BID  . insulin glargine  25 Units Subcutaneous BID  . iohexol  25 mL Oral Q1 Hr x 2  . meropenem (MERREM) IV  1 g Intravenous Q12H  . metoprolol  5 mg Intravenous 4 times per day  . [START ON 05/24/2015] multivitamin  5 mL Per Tube Daily  . ranitidine  150 mg Per Tube Daily  . sodium chloride   10-40 mL Intracatheter Q12H  . sodium chloride  3 mL Intravenous Q12H   Continuous: . TPN (CLINIMIX) Adult without lytes 40 mL/hr at 05/23/15 0600   And  . fat emulsion 120 mL (05/23/15 0600)  .  sodium bicarbonate  infusion 1000 mL 40 mL/hr at 05/23/15 0754   JWJ:XBJYNWGNFAOZH (TYLENOL) oral liquid 160 mg/5 mL, [DISCONTINUED] acetaminophen **OR** acetaminophen, bismuth subsalicylate, diphenhydrAMINE, fentaNYL (SUBLIMAZE) injection, hydrALAZINE, levalbuterol, metoprolol, ondansetron (ZOFRAN) IV **OR** ondansetron (ZOFRAN) IV, promethazine, sodium chloride  Assessment/Plan:  Principal Problem:   Acute necrotizing pancreatitis with SIRS Active Problems:   Gallstones   Transaminitis   DM type 2 (diabetes mellitus, type 2) (HCC)   Hyperlipidemia   ARF (acute renal failure) (HCC)   Acute respiratory failure with hypoxia (HCC)   Aspiration pneumonia due to vomit ([place])   Acute on chronic respiratory failure (HCC)   Protein-calorie malnutrition, moderate (HCC)   Umbilical hernia   Chronic kidney disease (CKD), stage III (moderate)   Olivopontocerebellar atrophy (HCC)   Abscess   Fever   Necrotizing pancreatitis   Acute respiratory distress (HCC)    Necrotizing pancreatitis -CT scan with evidence of gallstones. Patient denies any history of alcohol intake. Ultrasound of the abdomen demonstrated GB thickening with pericholecystic fluid and 14m stone lodged in neck of gallbladder and no Murphy's sign. CBD is normal size.  -General surgery is following. No clear evidence for acute cholecystitis.  -Patient is currently on meropenem, which will be continued. -Patient not able to  tolerate MRI of abdomen due to not able to hold breath for longer duration.  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  -WBC and 10 years to improve. Continues to have low-grade fever. Continue management as  outlined. Plan is for repeat CT scan today. Post pyloric NG tube in place with feedings.  Tachypnea/recent aspiration requiring intubation Most likely secondary to acute illness.  Patient was intubated in the intensive care unit after she aspirated. Has been stable. Critical care medicine was reconsulted. They're monitoring closely. Patient remains stable.  Fever Fever is most likely secondary to acute severe pancreatitis. WBC is slightly better. Urine culture from January 22 did not show any growth. Aspirate from peritoneal fluid does not show any growth. Blood cultures have been negative so far.IR was consulted for fluids collection drain and culture. US guided para was done on 1/15. C. difficile was negative. Chest x-ray showed early evidence for aspiration. No new infiltrative processes noted.  Metabolic acidosis Related to pancreatitis. On Bicarb infusion. Improved.  Hypercalcemia Possibly related to pancreatitis. PTH normal , Vitamin D level low. Continue with IV fluids. Patient also received calcitonin. Calcium level is improved.  Anemia Likely anemia of chronic disease. She was transfused 2 units of blood. Hemoglobin is stable.  Hypomagnesemia Replaced.  Septic shock Required pressors. Now off pressors. Blood pressure is stable.  VDRF Intubated on 1/14 and extubated on 1/15. Remains stable.  PAF/SVT Required cardizem drip, now on lopressor. Has been in sinus tachycardia since transfer to [facility]. Echo lvef 55%-60%, dynamic obstruction reported, grade 1 diastolic dysfunction reported. Increased betablocker iv lopressor from q8hrs to q6hr on 1/17. Continue monitor bp and heart rate.  UTI Kleb pneumonia. Finished abx treatment. Repeat cultures were negative.  Urinary retention Urology consulted initially during hospitalization due to difficult foley placed placement. She is found to have grade 2 cystocele and urethral stenosis. Outpatient urology follow-up. We'll need to leave  Foley catheter in for now.  ARF on CKD III Better. Creatinine now back to baseline. Continue with IV fluids. Continue to monitor urine output. Follow labs periodically.   Transaminitis LFTs are improved. Probably related to her necrotizing pancreatitis/sepsis/tpn. Ultrasound of the right upper quadrant as above. Hepatitis panel is negative. Continue to trend LFTs. Alk phosphatase still elevated.   Insulin dependent Diabetes mellitus type 2, poorly controlled, with chronic kidney disease HbA1c is 7.7.CBG's are reasonably well controlled. Continue Lantus. Patient is also getting insulin with TPN.  Nutrition Patient has inadequate oral intake due to inability to eat due to acute. She is getting nutrition via TPN. She'll be weaned off today. Patient is also getting tube feedings at a slow rate. Nutrition is following.    DVT Prophylaxis: Subcutaneous heparin    Code Status: Full code  Family Communication: Discussed with the patient  Disposition Plan: Continue stepdown setting for now.    LOS: 25 days   [facility] Pager [phone] 05/23/2015, 8:16 AM  If 7PM-7AM, please contact night-coverage at [email or web address], password [facility]

## 2015-05-23 NOTE — Progress Notes (Signed)
Nutrition Follow-up  DOCUMENTATION CODES:   Not applicable  INTERVENTION:  -Continue Vital 1.5 @ 39mL/hr, increase by 10 every 8 hours to goal rate of 50 mL/hr. -TF regimen provides 1800 calories, 81g protein, and 917cc free H2O -MD monitor Mg, Phos, K for at least 3 days, replete as necessary. Currently Mg & Phos are close to lower limits - possible refeeding syndrome.  NUTRITION DIAGNOSIS:   Inadequate oral intake related to inability to eat as evidenced by NPO status.  ongoing  GOAL:   Patient will meet greater than or equal to 90% of their needs  meeting  MONITOR:   Labs, Weight trends, TF tolerance, Skin, I & O's, Other (Comment), Diet advancement (TPN)  REASON FOR ASSESSMENT:   Consult Enteral/tube feeding initiation and management  ASSESSMENT:   Victoria Morrow is a 65 y.o. female with a past medical history of type 2 diabetes on insulin, chronic kidney disease of unknown stage, hyperlipidemia, who was in her usual state of health till earlier today when she started developing nausea followed by vomiting. She also started having upper abdominal pain. Symptoms got worse and she decided to come into the hospital for further evaluation. She denies having had similar symptoms in the past. Pain is located in the upper abdomen without any radiation. It's achy pain 10 out of 10 in intensity. No aggravating or relieving factors. No known precipitant factors. Denies any recent antibiotic use. No sick contacts. No recent hospitalization. Denies any fever but has had chills. Denies any blood in the emesis or in the stool.   Per RN, pt's TPN will be d/c at 1800 today. Currently receiving Clinimix 5/15 at a goal rate of 40 ml/hr + 20% fat emulsion at 5 ml/hr to provide: 48 g/day protein, 921 Kcal/day.  Possibly will be advanced to CLD. CT scan of abdomen was done earlier today, results have not been reported yet.  No issues w/ tube feed tolerance at this time.   RD will continue to  monitor for tolerance, diet advancement, PO intake.   Labs: Na 146, K, Mg, Phos WNL. Mg & K close to lower limits. Continue to monitor.  Medications: Fentanyl, [place]ner[place] Anti-Hypertensives: Lopressor, Apresoline PRN    Diet Order:  Diet NPO time specified Except for: Ice Chips TPN [facility]) Adult without lytes  Skin:  Reviewed, no issues  Last BM:  1/25  Height:   Ht Readings from Last 1 Encounters:  05/01/15  (1.575 m)    Weight:   Wt Readings from Last 1 Encounters:  05/23/15 141 lb 15.6 oz (64.4 kg)    Ideal Body Weight:  50 kg  BMI:  Body mass index is 25.96 kg/(m^2).  Estimated Nutritional Needs:   Kcal:  1600-1800  Protein:  75-85g  Fluid:  1.8 - 2L/day  EDUCATION NEEDS:   No education needs identified at this time  Dionne Ano. Lelar Farewell, MS, RD LDN After Hours/Weekend Pager [phone]

## 2015-05-23 NOTE — Progress Notes (Signed)
PARENTERAL NUTRITION CONSULT NOTE - FOLLOW-UP  Pharmacy Consult for TPN Indication: severe necrotizing pancreatitis with ileus  No Known Allergies  Patient Measurements: Height: 5' 2"  (157.5 cm) Weight: 141 lb 15.6 oz (64.4 kg) IBW/kg (Calculated) : 50.1  Vital Signs: Temp: 99.7 F (37.6 C) (01/26 0400) Temp Source: Oral (01/26 0400) BP: 134/65 mmHg (01/26 0600) Pulse Rate: 119 (01/26 0600) Intake/Output from previous day: 01/25 0701 - 01/26 0700 In: 4208 [I.V.:840; NG/GT:460; IV Piggyback:502; ULA:4536] Out: 2325 [Urine:1900; Stool:425]    Labs:  Recent Labs  05/21/15 0420 05/23/15 0445  WBC 14.4* 14.0*  HGB 8.5* 8.0*  HCT 26.3* 25.0*  PLT 527* 465*     Recent Labs  05/21/15 0420 05/22/15 0430 05/23/15 0445  NA 145 142 146*  K 3.5 3.5 3.8  CL 113* 110 112*  CO2 24 23 21*  GLUCOSE 134* 151* 132*  BUN 29* 30* 27*  CREATININE 1.01* 1.01* 0.92  CALCIUM 9.7 8.9 10.1  MG 1.5* 1.9 1.8  PHOS 2.1* 2.6 3.2  PROT 6.1*  --  6.2*  ALBUMIN 1.6*  --  1.7*  AST 56*  --  67*  ALT 48  --  52  ALKPHOS 317*  --  338*  BILITOT 0.6  --  0.7   Estimated Creatinine Clearance: 53 mL/min (by C-G formula based on Cr of 0.92).    Recent Labs  05/22/15 1952 05/22/15 2349 05/23/15 0441  GLUCAP 155* 109* 101*    Medical History: Past Medical History  Diagnosis Date  . Olivopontocerebellar atrophy (HCC)     Mgmt Dr Brett Fairy  . Diabetes mellitus     Type II. Insulin dependent. Diabetic nephropathy.    Insulin Requirements:   Lantus: 25 units BID Regular: 51units/day in TPN(Remains at 53units/liter) Novolog: 18 units on resistant SSI last 24hrs   Current Nutrition:  Diet: NPO Tube feeds: Vital 1.5c @ 20 ml/hr (provides 32 g protein, 720 kcal) -- Per surgery, advance to target TF goal per RD recommendations   IVF: D5% with Sodium Bicarb 150 meq at 40 ml/hr   Central access: PICC per IR TPN start date: 1/9  ASSESSMENT                                                                                                           HPI:  47 YOF presents with N/V and abdominal pain on 1/1, found to have gallstone pancreatitis.  CT revealing necrotizing pancreatitis. Surgery note form 1/8 states severe abdominal distention and ileus.  Pharmacy asked to start TPN on 1/7 but PICC has not been able to be placed by IV team.  Plans for IR to place PICC. Of note, she has DM on lantus and metformin prior to admission as well as hyperlipidemia on statin therapy.   Significant events:   1/13: NG removed.  Continues on D5W despite glucose d/t chloride elevated.  Discussed with MD and patient needs continued fluid replacement  1/15: US paracentesis  1/16: D5W dc'd and Lantus increased to help control CBGs  1/17: TPN at goal.  Added D5W w/ KCl at a low rate to prevent hypoglycemia.  1/19 IVF changed to bicarb with KCl (No other source of K with electrolyte-free TPN.)  1/20 post-pyloric feeding tube placed, begin trickle tube feeds.  1/21 Transfuse 1 unit PRBC for anemia.  Changed NaBicarb fluids to 0.45% NaCl for hypernatremia.  Stool x3 (mucousy-green diarrhea per RN, negative for Cdiff)  1/23 Spiking fevers, tachypnic - CXR and ABG pending, CCM to see.  1/25 TF increased to 72m/hr. TPN rate decreased.  Today, 05/23/2015:   Glucose - CBGs mostly at goal of <150 (Range 101-160) with no hypoglycemia last 24hrs.    Electrolytes - K and phos improved after IV bolus. Magnesium wnl. Na and Cl trended up. Remains on Bicarb/dextrose infusion. Corr Ca 11.9 (PTH is WNL; Calcitonin given x 2 doses on 1/19-1/20, repeated 1/23-1/24).  Ca x Phos product is  < 55.  Renal - SCr stable, UOP remains adequate  LFTs - Alk Phos cont to trend up, AST remains mildly elevated. Albumin remains low.  TGs - wnl (1/16), 141 (1/23)  Prealbumin - decreased to 5.3 (1/16), 10.1 (1/23)  NUTRITIONAL GOALS                                                                                            RD recs (updated 1/19): 75-85 g/day protein, 1600-1800 Kcal/day  Clinimix 5/15 at a goal rate of 40 ml/hr + 20% fat emulsion at 5 ml/hr to provide: 48 g/day protein,  921 Kcal/day.  PLAN                                                                                                                          At 1400 today, decrease current Clinimix 5/15 to 261mhr then DC at 1800.  At 1800 today, DC 20% fat emulsion.   Tomorrow, start liquid MVI via tube. (Also switch Famotidine IV to Ranitidine VT.)  Starting tonight, reduce SSI from resistant to moderate, cont CBGs q4h.  Starting tonight, decrease Lantus to 20 units q12h. Further management of insulin and CBGs per MD. Consider resuming home metformin(can be crushed and given via tube.)   Maintenance IVF per MD.  DC TPN lab panels on Mondays & Thursdays.    Advancement of tube feeds per RD.  ToRomeo RabonPharmD, pager [phone]/26/2017,7:56 AM.

## 2015-05-23 NOTE — Progress Notes (Signed)
[facility] Progress Note     Subjective: Denies pain or N/V.  Tolerating TF well, but only at 67mL/hr.  TPN weaning to off.  Awaiting CT scan.  Working with PT.  Having BM's and flatus.  Wants clear liquids.  Wants to go home.  Objective: Vital signs in last 24 hours: Temp:  [98.4 F (36.9 C)-99.7 F (37.6 C)] 99.7 F (37.6 C) (01/26 0400) Pulse Rate:  [99-119] 119 (01/26 0600) Resp:  [22-34] 29 (01/26 0600) BP: (108-134)/(52-66) 134/65 mmHg (01/26 0600) SpO2:  [98 %-100 %] 100 % (01/26 0600) Weight:  [64.4 kg (141 lb 15.6 oz)] 64.4 kg (141 lb 15.6 oz) (01/26 0500) Last BM Date: 05/22/15  Intake/Output from previous day: 01/25 0701 - 01/26 0700 In: 4208 [I.V.:840; NG/GT:460; IV Piggyback:502; TPN:2406] Out: 2325 [Urine:1900; Stool:425] Intake/Output this shift:    PE: Gen:  Alert, NAD, pleasant Card:  RRR, no M/G/R heard Pulm:  CTA, no W/R/R Abd: Soft, NT/ND, +BS, no HSM   Lab Results:   Recent Labs  05/21/15 0420 05/23/15 0445  WBC 14.4* 14.0*  HGB 8.5* 8.0*  HCT 26.3* 25.0*  PLT 527* 465*   BMET  Recent Labs  05/22/15 0430 05/23/15 0445  NA 142 146*  K 3.5 3.8  CL 110 112*  CO2 23 21*  GLUCOSE 151* 132*  BUN 30* 27*  CREATININE 1.01* 0.92  CALCIUM 8.9 10.1   PT/INR No results for input(s): LABPROT, INR in the last 72 hours. CMP     Component Value Date/Time   NA 146* 05/23/2015 0445   K 3.8 05/23/2015 0445   CL 112* 05/23/2015 0445   CO2 21* 05/23/2015 0445   GLUCOSE 132* 05/23/2015 0445   BUN 27* 05/23/2015 0445   CREATININE 0.92 05/23/2015 0445   CALCIUM 10.1 05/23/2015 0445   CALCIUM 10.5* 05/16/2015 1010   PROT 6.2* 05/23/2015 0445   ALBUMIN 1.7* 05/23/2015 0445   AST 67* 05/23/2015 0445   ALT 52 05/23/2015 0445   ALKPHOS 338* 05/23/2015 0445   BILITOT 0.7 05/23/2015 0445   GFRNONAA >60 05/23/2015 0445   GFRAA >60 05/23/2015 0445   Lipase     Component Value Date/Time   LIPASE 44 05/12/2015 0422        Studies/Results: No results found.  Anti-infectives: Anti-infectives    Start     Dose/Rate Route Frequency Ordered Stop   05/18/15 2200  meropenem (MERREM) 1 g in sodium chloride 0.9 % 100 mL IVPB     1 g 200 mL/hr over 30 Minutes Intravenous Every 12 hours 05/18/15 0859     05/17/15 1800  meropenem (MERREM) 1 g in sodium chloride 0.9 % 100 mL IVPB  Status:  Discontinued     1 g 200 mL/hr over 30 Minutes Intravenous Every 8 hours 05/17/15 1022 05/18/15 0859   05/13/15 1000  fluconazole (DIFLUCAN) IVPB 100 mg     100 mg 50 mL/hr over 60 Minutes Intravenous Every 24 hours 05/12/15 0813 05/16/15 1300   05/12/15 1000  fluconazole (DIFLUCAN) IVPB 200 mg     200 mg 100 mL/hr over 60 Minutes Intravenous  Once 05/12/15 0813 05/12/15 1029   05/08/15 2200  meropenem (MERREM) 1 g in sodium chloride 0.9 % 100 mL IVPB  Status:  Discontinued     1 g 200 mL/hr over 30 Minutes Intravenous Every 12 hours 05/08/15 1054 05/17/15 1022   05/03/15 1200  vancomycin (VANCOCIN) 50 mg/mL oral solution 500 mg  Status:  Discontinued  500 mg Oral 4 times per day 05/03/15 0917 05/04/15 1306   05/02/15 1200  cefTRIAXone (ROCEPHIN) 1 g in dextrose 5 % 50 mL IVPB  Status:  Discontinued     1 g 100 mL/hr over 30 Minutes Intravenous Every 24 hours 05/01/15 1448 05/01/15 2004   05/02/15 0600  imipenem-cilastatin (PRIMAXIN) 250 mg in sodium chloride 0.9 % 100 mL IVPB  Status:  Discontinued     250 mg 200 mL/hr over 30 Minutes Intravenous 3 times per day 05/01/15 2025 05/02/15 0027   05/02/15 0030  meropenem (MERREM) 500 mg in sodium chloride 0.9 % 50 mL IVPB  Status:  Discontinued     500 mg 100 mL/hr over 30 Minutes Intravenous Every 12 hours 05/02/15 0027 05/08/15 1054   05/01/15 2030  imipenem-cilastatin (PRIMAXIN) 250 mg in sodium chloride 0.9 % 100 mL IVPB     250 mg 200 mL/hr over 30 Minutes Intravenous NOW 05/01/15 2025 05/01/15 2110   04/30/15 1100  metroNIDAZOLE (FLAGYL) IVPB 500 mg  Status:   Discontinued     500 mg 100 mL/hr over 60 Minutes Intravenous Every 12 hours 04/30/15 0951 05/02/15 0751   04/30/15 1000  cefTRIAXone (ROCEPHIN) 1 g in dextrose 5 % 50 mL IVPB  Status:  Discontinued     1 g 100 mL/hr over 30 Minutes Intravenous Every 24 hours 04/30/15 0939 05/01/15 1205   04/30/15 0945  cefTRIAXone (ROCEPHIN) injection 1 g  Status:  Discontinued     1 g Intramuscular Every 24 hours 04/30/15 0930 04/30/15 0938   04/29/15 0200  imipenem-cilastatin (PRIMAXIN) 250 mg in sodium chloride 0.9 % 100 mL IVPB  Status:  Discontinued     250 mg 200 mL/hr over 30 Minutes Intravenous Every 8 hours 04/28/15 1812 04/30/15 0930   04/28/15 1830  imipenem-cilastatin (PRIMAXIN) 250 mg in sodium chloride 0.9 % 100 mL IVPB     250 mg 200 mL/hr over 30 Minutes Intravenous  Once 04/28/15 1812 04/28/15 1904       Assessment/Plan Acute biliary pancreatitis with necrosis, cholelithiasis TF to goal when able D/C TNA  Empiric abx per medical service Schedule CT abd for today to follow up pancreatitis, fluid collections Will follow - no role for acute surgical intervention at this point             Will need delayed cholecystectomy once pancreatitis is resolved (not this admission)             Ordered OT/social work             D/c to SNF vs home with [facility] services     LOS: 25 days    Nonie Hoyer 05/23/2015, 7:28 AM Pager: [phone]

## 2015-05-24 DIAGNOSIS — N183 Chronic kidney disease, stage 3 (moderate): Secondary | ICD-10-CM

## 2015-05-24 LAB — GLUCOSE, CAPILLARY
GLUCOSE-CAPILLARY: 128 mg/dL — AB (ref 65–99)
GLUCOSE-CAPILLARY: 137 mg/dL — AB (ref 65–99)
GLUCOSE-CAPILLARY: 222 mg/dL — AB (ref 65–99)
Glucose-Capillary: 184 mg/dL — ABNORMAL HIGH (ref 65–99)
Glucose-Capillary: 214 mg/dL — ABNORMAL HIGH (ref 65–99)

## 2015-05-24 LAB — CBC
HEMATOCRIT: 24.7 % — AB (ref 36.0–46.0)
HEMOGLOBIN: 8 g/dL — AB (ref 12.0–15.0)
MCH: 28.8 pg (ref 26.0–34.0)
MCHC: 32.4 g/dL (ref 30.0–36.0)
MCV: 88.8 fL (ref 78.0–100.0)
Platelets: 483 10*3/uL — ABNORMAL HIGH (ref 150–400)
RBC: 2.78 MIL/uL — ABNORMAL LOW (ref 3.87–5.11)
RDW: 15.2 % (ref 11.5–15.5)
WBC: 12.3 10*3/uL — ABNORMAL HIGH (ref 4.0–10.5)

## 2015-05-24 LAB — BASIC METABOLIC PANEL
ANION GAP: 11 (ref 5–15)
BUN: 23 mg/dL — ABNORMAL HIGH (ref 6–20)
CHLORIDE: 109 mmol/L (ref 101–111)
CO2: 26 mmol/L (ref 22–32)
Calcium: 10.1 mg/dL (ref 8.9–10.3)
Creatinine, Ser: 1.16 mg/dL — ABNORMAL HIGH (ref 0.44–1.00)
GFR calc Af Amer: 56 mL/min — ABNORMAL LOW (ref >60)
GFR, EST NON AFRICAN AMERICAN: 48 mL/min — AB (ref >60)
GLUCOSE: 151 mg/dL — AB (ref 65–99)
POTASSIUM: 4 mmol/L (ref 3.5–5.1)
Sodium: 146 mmol/L — ABNORMAL HIGH (ref 135–145)

## 2015-05-24 LAB — LIPASE, BLOOD: Lipase: 902 U/L — ABNORMAL HIGH (ref 11–51)

## 2015-05-24 NOTE — Progress Notes (Signed)
OT Cancellation Note  Patient Details Name: Victoria Morrow MRN: [id number] DOB: [birth year]   Cancelled Treatment:    Reason Eval/Treat Not Completed: Other (comment).  Per RN, pt is being transferred to [facility] very soon.  Brantley Wiley 05/24/2015, 2:20 PM  Marica Otter, OTR/L [phone] 05/24/2015

## 2015-05-24 NOTE — Discharge Summary (Signed)
[facility]  Physician Discharge Summary   Patient ID: Victoria Morrow MRN: [id number] DOB/AGE: Apr 03, 1951 65 y.o.  Admit date: 04/28/2015 Discharge date: 05/24/2015  PCP: Rachell Cipro, MD  DISCHARGE DIAGNOSES:  Principal Problem:   Acute necrotizing pancreatitis with SIRS Active Problems:   Gallstones   Transaminitis   DM type 2 (diabetes mellitus, type 2) ([place])   Hyperlipidemia   ARF (acute renal failure) (HCC)   Acute respiratory failure with hypoxia (HCC)   Aspiration pneumonia due to vomit (HCC)   Acute on chronic respiratory failure (HCC)   Protein-calorie malnutrition, moderate (HCC)   Umbilical hernia   Chronic kidney disease (CKD), stage III (moderate)   Olivopontocerebellar atrophy (HCC)   Abscess   Fever   Necrotizing pancreatitis   Acute respiratory distress (HCC)    THIS DISCHARGE SUMMARY IS MEANT TO BE USED ONLY FOR PATIENT TRANSFER TO [place]    RECOMMENDATIONS FOR OUTPATIENT FOLLOW UP: 1. To be determined when the patient is discharged from [facility].   Diet recommendation: She remains nothing by mouth. Currently receiving feeds through a nasal feeding tube.  Filed Weights   05/21/15 0426 05/22/15 0422 05/23/15 0500  Weight: 64.3 kg (141 lb 12.1 oz) 65.5 kg (144 lb 6.4 oz) 64.4 kg (141 lb 15.6 oz)    INITIAL HISTORY: 65 year old African-American female with a past medical history of type 2 diabetes on insulin, chronic kidney disease of unknown stage, presented with abdominal pain, nausea and vomiting. She was found to have severe pancreatitis thought to be biliary in origin. She was hospitalized for further management. Critical care medicine was consulted.  1/4 vomited, aspirated, intubated, shock Required neo since 1/7, transferred care under critical care 1/5 extubated 1/7 SVT requiring cardizem gtt 1/8 Vomiting - NG to LIS 1/12 off cardizem gtt 1/14 [facility], with critical care and general surgery  following .Marland Kitchen  Patient getting treatment for pancreatitis, pancreatic necrosis. She is on IV antibiotics, WBC fluctuates, spikes fevers. She was started on tube feeding.   Consultants:   General Surgery  Urology  CCM  Procedures:   Intubation /extubation/ng  Central line  tpn  US guided para on 1/15  Antibiotics: Anti-infectives    Start     Dose/Rate Route Frequency Ordered Stop   05/18/15 2200  meropenem (MERREM) 1 g in sodium chloride 0.9 % 100 mL IVPB     1 g 200 mL/hr over 30 Minutes Intravenous Every 12 hours 05/18/15 0859     05/17/15 1800  meropenem (MERREM) 1 g in sodium chloride 0.9 % 100 mL IVPB  Status:  Discontinued     1 g 200 mL/hr over 30 Minutes Intravenous Every 8 hours 05/17/15 1022 05/18/15 0859   05/13/15 1000  fluconazole (DIFLUCAN) IVPB 100 mg     100 mg 50 mL/hr over 60 Minutes Intravenous Every 24 hours 05/12/15 0813 05/16/15 1300   05/12/15 1000  fluconazole (DIFLUCAN) IVPB 200 mg     200 mg 100 mL/hr over 60 Minutes Intravenous  Once 05/12/15 0813 05/12/15 1029   05/08/15 2200  meropenem (MERREM) 1 g in sodium chloride 0.9 % 100 mL IVPB  Status:  Discontinued     1 g 200 mL/hr over 30 Minutes Intravenous Every 12 hours 05/08/15 1054 05/17/15 1022   05/03/15 1200  vancomycin (VANCOCIN) 50 mg/mL oral solution 500 mg  Status:  Discontinued     500 mg Oral 4 times per day 05/03/15 0917 05/04/15 1306   05/02/15 1200  cefTRIAXone (ROCEPHIN) 1 g in dextrose 5 % 50 mL IVPB  Status:  Discontinued     1 g 100 mL/hr over 30 Minutes Intravenous Every 24 hours 05/01/15 1448 05/01/15 2004   05/02/15 0600  imipenem-cilastatin (PRIMAXIN) 250 mg in sodium chloride 0.9 % 100 mL IVPB  Status:  Discontinued     250 mg 200 mL/hr over 30 Minutes Intravenous 3 times per day 05/01/15 2025 05/02/15 0027   05/02/15 0030  meropenem (MERREM) 500 mg in sodium chloride 0.9 % 50 mL IVPB  Status:  Discontinued     500 mg 100 mL/hr over 30 Minutes Intravenous Every 12  hours 05/02/15 0027 05/08/15 1054   05/01/15 2030  imipenem-cilastatin (PRIMAXIN) 250 mg in sodium chloride 0.9 % 100 mL IVPB     250 mg 200 mL/hr over 30 Minutes Intravenous NOW 05/01/15 2025 05/01/15 2110   04/30/15 1100  metroNIDAZOLE (FLAGYL) IVPB 500 mg  Status:  Discontinued     500 mg 100 mL/hr over 60 Minutes Intravenous Every 12 hours 04/30/15 0951 05/02/15 0751   04/30/15 1000  cefTRIAXone (ROCEPHIN) 1 g in dextrose 5 % 50 mL IVPB  Status:  Discontinued     1 g 100 mL/hr over 30 Minutes Intravenous Every 24 hours 04/30/15 0939 05/01/15 1205   04/30/15 0945  cefTRIAXone (ROCEPHIN) injection 1 g  Status:  Discontinued     1 g Intramuscular Every 24 hours 04/30/15 0930 04/30/15 0938   04/29/15 0200  imipenem-cilastatin (PRIMAXIN) 250 mg in sodium chloride 0.9 % 100 mL IVPB  Status:  Discontinued     250 mg 200 mL/hr over 30 Minutes Intravenous Every 8 hours 04/28/15 1812 04/30/15 0930   04/28/15 1830  imipenem-cilastatin (PRIMAXIN) 250 mg in sodium chloride 0.9 % 100 mL IVPB     250 mg 200 mL/hr over 30 Minutes Intravenous  Once 04/28/15 1812 04/28/15 [place] COURSE:   Necrotizing pancreatitis -initial CT scan with evidence of gallstones. Patient denies any history of alcohol intake. Ultrasound of the abdomen demonstrated GB thickening with pericholecystic fluid and 75m stone lodged in neck of gallbladder and no Murphy's sign. CBD is normal size.  -General surgery was consulted. No clear evidence for acute cholecystitis.  -Patient is currently on meropenem (Day 23). -Patient was not able to tolerate MRI of abdomen due to not able to hold breath for longer duration.  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  - Feeding tube was placed nasally, and patient was started on feeds - CT scan was repeated, 1/26. Once again shows the changes of necrotizing pancreatitis.  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 Please see report for details. - The above CT scan was discussed with the general surgery. Dr. GHarlow Asawho has discussed this with the gastroenterology and IR. Considering compression being caused by this fluid collection there may be some benefit to draining this collection, however, no such expertise exists in our hospital system. So the surgeon and I agreed that we should do pursue sending the patient to a tertiary care center. [facility] He agrees that the patient may benefit from some intervention. He along with the pulmonologist at same facility, Dr. DBeryl Meagerhas accepted the [facility] This was discussed in detail with the patient as well as her husband. Both are agreeable with this plan.  Tachypnea/recent aspiration requiring intubation Most likely secondary to acute illness. Patient was intubated in the intensive care unit after she aspirated. Has been stable. Critical care medicine was reconsulted. They have been monitoring closely. Patient remains stable. Tachypnea has improved.  Fever Fever is most likely secondary to acute severe pancreatitis. WBC is better. Urine culture from January 22 did not show any growth. Aspirate from peritoneal fluid does not show any growth. Blood cultures have been negative so far.IR was consulted for fluids collection drain and culture. US guided para was done on 1/15. C. difficile was negative. Chest x-ray showed early evidence for aspiration. No new infiltrative processes noted.  Metabolic acidosis Related to pancreatitis. On Bicarb infusion. Improved.  Hypercalcemia Possibly related to pancreatitis. PTH normal, Vitamin D level low. Continue with IV fluids. Patient also received calcitonin. Calcium level is  improved.  Anemia Likely anemia of chronic disease. She was transfused 2 units of blood. Hemoglobin is stable.  Hypomagnesemia Replaced.  Septic shock Required pressors. Now off pressors. Blood pressure is stable.  VDRF Intubated on 1/14 and extubated on 1/15. Remains stable.  PAF/SVT Required cardizem drip, now on lopressor. Has been in sinus tachycardia since transfer to [facility]. Echo lvef 55%-60%, dynamic obstruction reported, grade 1 diastolic dysfunction reported. Increased betablocker iv lopressor from q8hrs to q6hr on 1/17. Continue monitor bp and heart rate.  UTI Kleb pneumonia. Finished abx treatment. Repeat cultures were negative.  Urinary retention Urology consulted initially during hospitalization due to difficult foley placed placement. She is found to have grade 2 cystocele and urethral stenosis. Outpatient urology follow-up. We'll need to leave Foley catheter in for now.  ARF on CKD III Creatinine was back to baseline. Noted to be slightly elevated today. Continue with IV fluids. Continue to monitor urine output. Follow labs periodically.   Transaminitis LFTs are improved. Probably related to her necrotizing pancreatitis/sepsis/tpn. Ultrasound of the right upper quadrant as above. Hepatitis panel is negative. Continue to trend LFTs. Alk phosphatase still elevated.   Insulin dependent Diabetes mellitus type 2, poorly controlled, with chronic kidney disease HbA1c is 7.7.CBG's are reasonably well controlled. Continue Lantus.   Nutrition Patient has inadequate oral intake due to inability to eat. She was getting nutrition via TPN which has been weaned off as of 1/26. She is getting feeds via nasal feeding tube. Nutritionist is following.    DVT Prophylaxis: Subcutaneous heparin  Code Status: Full code   Okay for transfer to [facility].  PERTINENT LABS:  The results of significant diagnostics from this hospitalization (including imaging, microbiology,  ancillary and laboratory) are listed below for reference.    Microbiology: Recent Results (from the past 240 hour(s))  C difficile quick scan w PCR reflex     Status: None   Collection Time: 05/18/15  8:38 AM  Result Value Ref Range Status   C Diff antigen NEGATIVE NEGATIVE Final   C Diff toxin NEGATIVE NEGATIVE Final   C Diff interpretation Negative for toxigenic C. difficile  Final  Urine culture     Status: None   Collection Time: 05/19/15 12:27 PM  Result Value Ref Range Status   Specimen Description URINE, CATHETERIZED  Final   Special Requests NONE  Final   Culture   Final    NO GROWTH 1 DAY Performed at [facility]    Report Status 05/20/2015 FINAL  Final  Labs: Basic Metabolic Panel:  Recent Labs Lab 05/19/15 0456 05/20/15 0500 05/21/15 0420 05/22/15 0430 05/23/15 0445 05/24/15 0416  NA 143 141 145 142 146* 146*  K 3.6 3.7 3.5 3.5 3.8 4.0  CL 112* 113* 113* 110 112* 109  CO2 20* 18* 24 23 21* 26  GLUCOSE 126* 111* 134* 151* 132* 151*  BUN 32* 32* 29* 30* 27* 23*  CREATININE 0.99 0.98 1.01* 1.01* 0.92 1.16*  CALCIUM 9.6 10.3 9.7 8.9 10.1 10.1  MG 1.8 2.0 1.5* 1.9 1.8  --   PHOS 2.5 3.9 2.1* 2.6 3.2  --    Liver Function Tests:  Recent Labs Lab 05/19/15 0930 05/20/15 0500 05/21/15 0420 05/23/15 0445  AST  --  42* 56* 67*  ALT  --  45 48 52  ALKPHOS  --  294* 317* 338*  BILITOT  --  0.9 0.6 0.7  PROT  --  6.0* 6.1* 6.2*  ALBUMIN 1.6* 1.6* 1.6* 1.7*    Recent Labs Lab 05/24/15 0416  LIPASE 902*   CBC:  Recent Labs Lab 05/19/15 0456 05/20/15 0500 05/21/15 0420 05/23/15 0445 05/24/15 0416  WBC 16.2* 16.3* 14.4* 14.0* 12.3*  NEUTROABS  --  13.3*  --   --   --   HGB 9.3* 9.1* 8.5* 8.0* 8.0*  HCT 28.3* 27.5* 26.3* 25.0* 24.7*  MCV 86.0 85.7 88.3 88.7 88.8  PLT 584* 513* 527* 465* 483*    CBG:  Recent Labs Lab 05/23/15 1516 05/23/15 1957 05/23/15 2345 05/24/15 0359 05/24/15 0755  GLUCAP 121* 108* 137* 128* 184*      IMAGING STUDIES Ct Abdomen Pelvis Wo Contrast  05/11/2015  CLINICAL DATA:  Severe pancreatitis. Intubated. Aspirated. Septic shock. EXAM: CT CHEST, ABDOMEN AND PELVIS WITHOUT CONTRAST TECHNIQUE: Multidetector CT imaging of the chest, abdomen and pelvis was performed following the standard protocol without IV contrast. COMPARISON:  CT abdomen pelvis 04/30/18 17 FINDINGS: CT CHEST FINDINGS Mediastinum/Nodes: RIGHT PICC line with tip in the distal SVC/RIGHT atrium. No axillary or supraclavicular adenopathy. No mediastinal hilar adenopathy. No pericardial fluid. Esophagus normal. Lungs/Pleura: There are small bilateral pleural effusions. There is bibasilar atelectasis with air bronchograms. Cannot exclude small foci of pneumonia or aspiration pneumonitis. Findings have improved from comparison CT. Musculoskeletal: No aggressive osseous lesion. CT ABDOMEN AND PELVIS FINDINGS Hepatobiliary: No focal hepatic lesions noncontrast exam. No biliary duct dilatation. Gallbladder is poorly defined. Several gallstones are noted within the gallbladder which is nondistended. Pancreas: There is continued increase edematous enlargement of the body and head of the pancreas with poor definition and low attenuation suggesting pancreatic necrosis. The pancreatic thickening measures 5.4 cm (image 56, series 2) compared to 3.8 cm on prior. The tail the pancreas is normal densities and similar prior. No pancreatic head is poorly defined on the background of edema and fluid. There is interval increase in fluid along the RIGHT pericolic gutter with the collection measures 7.5 x 6.1 cm (image 72, series 2) increased from 6.4 x 3.6 cm. Increase fluid along the LEFT pericolic gutter which is less well organized. Spleen: Normal spleen Adrenals/urinary tract: Adrenal glands and kidneys are normal. Foley catheter within the bladder. Stomach/Bowel: The stomach, small bowel, and colon are normal. No evidence of bowel obstruction or  pneumatosis pneumatosis. No IV oral contrast administered. Fluid stool rectum. Vascular/Lymphatic: Abdominal aorta is normal caliber. There is no retroperitoneal or periportal lymphadenopathy. No pelvic lymphadenopathy. Reproductive: Uterus there is normal. Other: No free fluid. Musculoskeletal: No aggressive osseous lesion. IMPRESSION: 1. Continued  concern for pancreatic necrosis with poor definition of the head and body the pancreas with increased low-attenuation edema compared to prior exam. 2. Increased fluid along the pericolic gutters. The are no clear organized pseudocyst at this point. The RIGHT pericolic gutter collection is the most organized. 3. Some improvement in bibasilar atelectasis and pleural effusions. There are bronchograms which could indicate infection. 4. No bowel obstruction. 5. Cholelithiasis within collapsed gallbladder. Electronically Signed   By: Suzy Bouchard M.D.   On: 05/11/2015 18:57   Ct Abdomen Pelvis Wo Contrast  05/01/2015  CLINICAL DATA:  Acute pancreatitis.  Cholelithiasis. EXAM: CT ABDOMEN AND PELVIS WITHOUT CONTRAST TECHNIQUE: Multidetector CT imaging of the abdomen and pelvis was performed following the standard protocol without IV contrast. COMPARISON:  CT 04/28/2015, ultrasound 04/28/2015 FINDINGS: Lower chest: Interval increase in bilateral pleural effusions with bibasilar with passive atelectasis and air bronchograms. No pericardial fluid. Hepatobiliary: The no focal hepatic lesions noncontrast exam. No biliary duct dilatation is evident on this noncontrast exam. Gallbladder is not distended. There several small gallstones noted measuring approximately 4 mm each. The common bile duct is difficult define does not appear distended. Pancreas: There is a decreased attenuation in the pancreas head and body as well as increase in pancreatic edema through the pancreatic head and body. The tail of the pancreas (image 29, series 2) is normal density without edema. The body of  the pancreas measures 3.8 cm (image 30, series )2 increased in thickness from 3.2 cm comparison CT. There is fluid along the LEFT and RIGHT anterior para renal space decreased prior. No organized fluid collections present. Spleen: Normal spleen Adrenals/urinary tract: Adrenal glands are normal. Kidneys appear atrophic. Foley catheter within a collapsed bladder. Stomach/Bowel: The stomach is normal. NG tube in the distal esophagus. 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 No complication. There is no evidence of leakage of oral contrast through the duodenum. The proximal small bowel is dilated up to 5 cm. There is poor egress of the oral contrast through the dilated small bowel which is fluid-filled. Favor these findings represent ileus associated with pancreatitis. Colon is collapsed. Small amount of contrast with the rectum related to prior CT scan on 04/28/2015. No intraperitoneal free air. Vascular/Lymphatic: Abdominal aorta is normal caliber with atherosclerotic calcification. There is no retroperitoneal or periportal lymphadenopathy. No pelvic lymphadenopathy. Reproductive: Uterus and ovaries are normal. Other: No free fluid. Musculoskeletal: No aggressive osseous lesion. IMPRESSION: 1. Progressive pancreatitis with edema and loss of normal pancreatic density within the pancreatic body and head. Findings are concerning for necrosis of the pancreas. This cannot be confirmed without IV contrast. 2. Interval increase in the fluid along the LEFT and RIGHT anterior pararenal spaces without organized fluid collections. 3. Several small gallstones within the gallbladder without evidence of cholecystitis. 4. Dilatation of the proximal small bowel with poor progression of contrast is most consistent pancreatitis associated ileus. 5. Marked increase in bilateral pleural effusions and bibasilar atelectasis. These results will be called to the ordering clinician or representative by  the Radiologist Assistant, and communication documented in the PACS or zVision Dashboard. Electronically Signed   By: Suzy Bouchard M.D.   On: 05/01/2015 19:12   Ct Abdomen Pelvis Wo Contrast  04/28/2015  CLINICAL DATA:  Diarrhea and abdominal cramping for 3 hours. Blood glucose 305. Abdominal pain. EXAM: CT ABDOMEN AND PELVIS WITHOUT CONTRAST TECHNIQUE: Multidetector CT imaging of the abdomen and pelvis was performed following the standard protocol without IV  contrast. COMPARISON:  Abdominal ultrasound of 05/10/2014.  No prior CT. FINDINGS: Lower chest: Clear lung bases. Normal heart size without pericardial or pleural effusion. A tiny hiatal hernia. Hepatobiliary: Normal noncontrast appearance of the liver. Gallstones. No pericholecystic edema. No choledocholithiasis or biliary duct dilatation. Pancreas: Enlargement of the pancreatic neck, head, and uncinate process. Peripancreatic edema, without well-defined collection. No pancreatic duct dilatation. Spleen: Normal in size, without focal abnormality. Adrenals/Urinary Tract: Normal adrenal glands. Mild left and moderate right renal cortical thinning. No hydronephrosis. No hydroureter or ureteric calculi. No bladder calculi. Stomach/Bowel: Normal remainder of the stomach. Normal colon and terminal ileum. The appendix is mildly prominent, measuring up to 8 mm on image 58/ series 2. No periappendiceal inflammation. Favored to be within normal variation. Gas and soft tissue density cephalad the transverse duodenum on image 36/ series 2 is favored to be related to a duodenal diverticulum. No edema posterior to this area. Small bowel otherwise unremarkable. Vascular/Lymphatic: Aortic and branch vessel atherosclerosis. No abdominopelvic adenopathy. Reproductive: Normal uterus and adnexa. Other: No free pelvic fluid. small volume perihepatic ascites. Mild pelvic floor laxity. Musculoskeletal: Prominent disc bulge at L4-5. IMPRESSION: 1. Upper abdominal edema,  eccentric right. Centered about the pancreatic head, uncinate process, and neck. Favor pancreatitis. Correlate with pancreatic enzymes (pending). If these are abnormal, less likely differential consideration would include peptic ulcer disease. 2. Cholelithiasis without specific evidence of cholecystitis or biliary duct dilatation. 3. Trace perihepatic ascites, likely secondary. 4. Small hiatal hernia. 5. Pelvic floor laxity. 6. Renal atrophy. Electronically Signed   By: Abigail Miyamoto M.D.   On: 04/28/2015 14:49   Ct Chest Wo Contrast  05/11/2015  CLINICAL DATA:  Severe pancreatitis. Intubated. Aspirated. Septic shock. EXAM: CT CHEST, ABDOMEN AND PELVIS WITHOUT CONTRAST TECHNIQUE: Multidetector CT imaging of the chest, abdomen and pelvis was performed following the standard protocol without IV contrast. COMPARISON:  CT abdomen pelvis 04/30/18 17 FINDINGS: CT CHEST FINDINGS Mediastinum/Nodes: RIGHT PICC line with tip in the distal SVC/RIGHT atrium. No axillary or supraclavicular adenopathy. No mediastinal hilar adenopathy. No pericardial fluid. Esophagus normal. Lungs/Pleura: There are small bilateral pleural effusions. There is bibasilar atelectasis with air bronchograms. Cannot exclude small foci of pneumonia or aspiration pneumonitis. Findings have improved from comparison CT. Musculoskeletal: No aggressive osseous lesion. CT ABDOMEN AND PELVIS FINDINGS Hepatobiliary: No focal hepatic lesions noncontrast exam. No biliary duct dilatation. Gallbladder is poorly defined. Several gallstones are noted within the gallbladder which is nondistended. Pancreas: There is continued increase edematous enlargement of the body and head of the pancreas with poor definition and low attenuation suggesting pancreatic necrosis. The pancreatic thickening measures 5.4 cm (image 56, series 2) compared to 3.8 cm on prior. The tail the pancreas is normal densities and similar prior. No pancreatic head is poorly defined on the background  of edema and fluid. There is interval increase in fluid along the RIGHT pericolic gutter with the collection measures 7.5 x 6.1 cm (image 72, series 2) increased from 6.4 x 3.6 cm. Increase fluid along the LEFT pericolic gutter which is less well organized. Spleen: Normal spleen Adrenals/urinary tract: Adrenal glands and kidneys are normal. Foley catheter within the bladder. Stomach/Bowel: The stomach, small bowel, and colon are normal. No evidence of bowel obstruction or pneumatosis pneumatosis. No IV oral contrast administered. Fluid stool rectum. Vascular/Lymphatic: Abdominal aorta is normal caliber. There is no retroperitoneal or periportal lymphadenopathy. No pelvic lymphadenopathy. Reproductive: Uterus there is normal. Other: No free fluid. Musculoskeletal: No aggressive osseous lesion. IMPRESSION: 1. Continued concern  for pancreatic necrosis with poor definition of the head and body the pancreas with increased low-attenuation edema compared to prior exam. 2. Increased fluid along the pericolic gutters. The are no clear organized pseudocyst at this point. The RIGHT pericolic gutter collection is the most organized. 3. Some improvement in bibasilar atelectasis and pleural effusions. There are bronchograms which could indicate infection. 4. No bowel obstruction. 5. Cholelithiasis within collapsed gallbladder. Electronically Signed   By: Suzy Bouchard M.D.   On: 05/11/2015 18:57   Ct Abdomen Pelvis W Contrast  05/23/2015  CLINICAL DATA:  65 year old female with history of necrotizing pancreatitis. Abdominal pain. Re-evaluate fluid collection. EXAM: CT ABDOMEN AND PELVIS WITH CONTRAST TECHNIQUE: Multidetector CT imaging of the abdomen and pelvis was performed using the standard protocol following bolus administration of intravenous contrast. CONTRAST:  165m OMNIPAQUE IOHEXOL 300 MG/ML  SOLN COMPARISON:  Multiple priors, most recently 05/16/2015. FINDINGS: Lower chest: Trace right and small left pleural  effusions. Areas of passive atelectasis are noted in the lower lobes of the lungs bilaterally. Central venous catheter in the superior vena cava low with tip terminating in the right atrium. Borderline cardiomegaly. Feeding tube in the distal esophagus extending below the diaphragm. Hepatobiliary: Mild periportal edema. Note suspicious cystic or solid hepatic lesions. No intra or extrahepatic biliary ductal dilatation. Numerous tiny calcified gallstones lying dependently in the gallbladder. Gallbladder wall appears diffusely thickened with surrounding inflammatory changes, likely reactive to the pancreatic process. Gallbladder is nearly completely decompressed, suggesting strongly against cholecystitis. 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 This collection is highly infiltrative extending throughout the retroperitoneum and is extremely large and difficult to accurately measure. When compared in a similar fashion to the prior study, the AP dimension of the collection appears essentially unchanged measuring 5.7 cm (image 38 of series 2). On the left side of the abdomen, this extends into the immediate subsplenic region, and into the left pericolic gutter posterior to the descending colon in a similar fashion to the prior examination. On the right side of the abdomen, the collection also extends into the subhepatic region immediately anterior to the right kidney, and posterior to the ascending colon and cecum, also very similar to the prior examination, where the largest pocket of fluid currently measures 7.0 x 9.2 cm (previously 6.4 x 8.4 cm). This pseudocyst comes in close proximity to the gallbladder, and the surrounding  inflammatory changes associated with the large pseudocyst are inseparable from the inflammatory changes associated with the gallbladder. Some of the fluid also dissects into the root of the small bowel mesentery, best appreciated on image 51 of series 2, also similar to the prior examination. Spleen: Unremarkable. Adrenals/Urinary Tract: Left kidney is normal in appearance. Multifocal cortical thinning in the right kidney, presumably areas of post infectious or inflammatory scarring. No hydroureteronephrosis. Urinary bladder is nearly completely decompressed with a Foley balloon catheter in place. Small amount of gas in the urinary bladder, presumably iatrogenic. Bilateral adrenal glands are normal in appearance. Stomach/Bowel: Normal appearance of the stomach. No pathologic dilatation of small bowel or colon. Feeding tube terminating in the proximal jejunum. Colonic wall thickening in the region of the hepatic flexure, presumably reactive secondary to adjacent inflammation associated with the pseudocyst. Vascular/Lymphatic: Atherosclerosis throughout the  abdominal and pelvic vasculature, without evidence of aneurysm or dissection. Splenic vein remains patent at this time. Superior mesenteric vein is patent. Proximal portal vein is markedly narrowed just distal to the splenoportal confluence, best appreciated on image 35 of series 2, and on coronal images 53 and 54 of series 602, which has significantly increased compared to the prior examination. No lymphadenopathy noted in the abdomen or pelvis. Multiple borderline enlarged omental lymph nodes are presumably reactive. Reproductive: Uterus and ovaries are unremarkable in appearance. Other: Trace volume of ascites.  No pneumoperitoneum. Musculoskeletal: There are no aggressive appearing lytic or blastic lesions noted in the visualized portions of the skeleton. IMPRESSION: 1. 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 This is exerting considerable mass effect upon the proximal portal vein which appears severely narrowed on today's examination, which has significantly increased compared to the prior study. Portal vein, superior mesenteric vein and splenic vein all remain grossly patent at this time, however, the these findings are highly concerning for impending portal vein thrombosis. 2. Inflammatory changes around the gallbladder, favored to be reactive. Although there is cholelithiasis, findings are not favored to reflect an acute cholecystitis at this time. 3. Trace right and small left pleural effusions. 4. Additional incidental findings, similar to the prior study, as above. These results were called by telephone at the time of interpretation on 05/23/2015 at 11:22 am to nurse Bridgett Larsson (for Dr. Armandina Gemma), who verbally acknowledged these results. Subsequently, this was also discussed by phone with Dr. Harlow Asa on 05/23/2015 at 11:32 a.m. Electronically Signed   By: Vinnie Langton M.D.   On: 05/23/2015 11:32   Ct Abdomen Pelvis W Contrast  05/16/2015  CLINICAL DATA:  Abdominal pain. Followup severe pancreatitis with imaging findings concerning for necrosis. EXAM: CT ABDOMEN AND PELVIS WITH CONTRAST TECHNIQUE: Multidetector CT imaging of the abdomen and pelvis was performed using the standard protocol following bolus administration of intravenous contrast. CONTRAST:  19m OMNIPAQUE IOHEXOL 300 MG/ML  SOLN COMPARISON:  05/11/2015. FINDINGS: Lower chest: Small bilateral pleural effusions, decreased. Increased bilateral lower lobe atelectasis. Hepatobiliary: Multiple small gallstones in the gallbladder measuring up to 4 mm in maximum diameter each. Pericholecystic fluid. No gallbladder wall thickening. No intrahepatic biliary ductal dilatation. Pancreas: Better defined confluent low density fluid replacing the head, neck, body and  proximal tail of the pancreas. This currently measures 5.7 cm in thickness and previously measured 5.4 cm in maximum thickness. Extensive pancreatic, peripancreatic and bilateral pericolic gutter fluid is again demonstrated. The previously demonstrated 7.5 x 6.2 cm right paracolic fluid collection currently measures 8.4 x 6.4 cm on image number 45 were mild peripheral rim enhancement. This communicates with the fluid in the pancreatic bed and extending into the left paracolic gutter, all with mild peripheral rim enhancement. Spleen: Within normal limits in size and appearance. Adrenals/Urinary Tract: Small right kidney with cortical scarring, parenchymal and parapelvic cysts. Small upper pole left renal parapelvic cysts. Foley catheter in the urinary bladder with associated air in the bladder. Diffuse bladder wall thickening. Normal appearing adrenal glands. Stomach/Bowel: No evidence of obstruction, inflammatory process, or abnormal fluid collections. Vascular/Lymphatic: No enlarged lymph nodes. Atheromatous arterial calcifications. Reproductive: Concentric, uniform endometrial thickening and enhancement with fluid in the endometrial cavity. No adnexal masses. Other: Very small umbilical hernia containing fat. Musculoskeletal: Mild lumbar and lower thoracic spine degenerative changes. IMPRESSION: 1. Extensive, necrotic pancreatitis were liquefaction of the head, neck, body  and proximal tail of the pancreas with a large amount of confluent fluid within the pancreatic bed, peripancreatic regions and extending into the paracolic gutters, right greater than left. This all has thin peripheral rim enhancement, suggesting the possibility of infected fluid. This has been discussed with Dr. Dalbert Batman. 2. Small bilateral pleural effusions with improvement. 3. Increased bilateral lower lobe atelectasis. 4. Cholelithiasis. 5. Pericholecystic fluid. This is most likely secondary to the pancreatitis rather than acute cholecystitis.  Electronically Signed   By: Claudie Revering M.D.   On: 05/16/2015 13:29   US Paracentesis  05/12/2015  CLINICAL DATA:  Peritoneal fluid.  Pancreatitis. EXAM: ULTRASOUND GUIDED PARACENTESIS TECHNIQUE: Survey ultrasound of the abdomen was performed and an appropriate skin entry site in the right lower abdomen was selected. Skin site was marked, prepped with Betadine, and draped in usual sterile fashion, and infiltrated locally with 1% lidocaine. A 5 French multisidehole Yueh sheath needle was advanced into the peritoneal space until fluid could be aspirated. The sheath was advanced and the needle removed. 112 cc of cloudy dark ascites were aspirated. COMPLICATIONS: Nine IMPRESSION: Technically successful ultrasound guided paracentesis, removing 112 cc of ascites. Electronically Signed   By: Marybelle Killings M.D.   On: 05/12/2015 14:04   Ir Fluoro Guide Cv Line Right  05/06/2015  CLINICAL DATA:  Aspiration pneumonia EXAM: RIGHT UPPER EX PICC LINE PLACEMENT WITH ULTRASOUND AND FLUOROSCOPIC GUIDANCE FLUOROSCOPY TIME:  18 seconds PROCEDURE: The patient was advised of the possible risks and complications and agreed to undergo the procedure. The patient was then brought to the angiographic suite for the procedure. The right arm was prepped with chlorhexidine, draped in the usual sterile fashion using maximum barrier technique (cap and mask, sterile gown, sterile gloves, large sterile sheet, hand hygiene and cutaneous antisepsis) and infiltrated locally with 1% Lidocaine. Ultrasound demonstrated patency of the right basilic vein, and this was documented with an image. Under real-time ultrasound guidance, this vein was accessed with a 21 gauge micropuncture needle and image documentation was performed. A 0.018 wire was introduced in to the vein. Over this, a 5 [place] double lumen power PICC was advanced to the lower SVC/right atrial junction. Fluoroscopy during the procedure and fluoro spot radiograph confirms appropriate  catheter position. The catheter was flushed and covered with a sterile dressing. COMPLICATIONS: None LENGTH: 38 cm IMPRESSION: Successful right arm power PICC line placement with ultrasound and fluoroscopic guidance. The catheter is ready for use. Electronically Signed   By: Marybelle Killings M.D.   On: 05/06/2015 12:57   Ir US Guide Vasc Access Right  05/06/2015  CLINICAL DATA:  Aspiration pneumonia EXAM: RIGHT UPPER EX PICC LINE PLACEMENT WITH ULTRASOUND AND FLUOROSCOPIC GUIDANCE FLUOROSCOPY TIME:  18 seconds PROCEDURE: The patient was advised of the possible risks and complications and agreed to undergo the procedure. The patient was then brought to the angiographic suite for the procedure. The right arm was prepped with chlorhexidine, draped in the usual sterile fashion using maximum barrier technique (cap and mask, sterile gown, sterile gloves, large sterile sheet, hand hygiene and cutaneous antisepsis) and infiltrated locally with 1% Lidocaine. Ultrasound demonstrated patency of the right basilic vein, and this was documented with an image. Under real-time ultrasound guidance, this vein was accessed with a 21 gauge micropuncture needle and image documentation was performed. A 0.018 wire was introduced in to the vein. Over this, a 5 [place] double lumen power PICC was advanced to the lower SVC/right atrial junction. Fluoroscopy during the procedure and fluoro  spot radiograph confirms appropriate catheter position. The catheter was flushed and covered with a sterile dressing. COMPLICATIONS: None LENGTH: 38 cm IMPRESSION: Successful right arm power PICC line placement with ultrasound and fluoroscopic guidance. The catheter is ready for use. Electronically Signed   By: Marybelle Killings M.D.   On: 05/06/2015 12:57   Dg Chest Port 1 View  05/20/2015  CLINICAL DATA:  65 year old female, respiratory failure and hypoxia. Vomiting, possible aspiration. Severe pancreatitis, pancreatic necrosis. Initial encounter. EXAM:  PORTABLE CHEST 1 VIEW COMPARISON:  05/18/2015 and earlier. FINDINGS: Portable AP semi upright view at 0844 hours. Enteric feeding tube in place, courses to the mid abdomen and tip not included. Stable right PICC line. Stable cardiac size and mediastinal contours. Continued bilateral lower lobe consolidation. Low lung volumes. No pneumothorax, pulmonary edema, or definite effusion. IMPRESSION: 1. Continued bilateral lower lobe consolidation which could be related to aspiration pneumonia. No definite pleural effusion. 2. Low lung volumes. 3.  Stable lines and tubes. Electronically Signed   By: Genevie Nyia M.D.   On: 05/20/2015 09:00   Dg Chest Port 1 View  05/18/2015  CLINICAL DATA:  Shortness of breath, weakness and increased temperature. EXAM: PORTABLE CHEST 1 VIEW COMPARISON:  CT of the chest dated 05/11/2015 FINDINGS: Right PICC line is in stable position, tip at the cavoatrial junction. There has been a placement of feeding catheter, tip collimated off the image at the level of the gastric antrum. Cardiomediastinal silhouette is normal. Mediastinal contours appear intact. There are low lung volumes with bibasilar atelectasis. Small subpulmonic effusions also possible. There is no evidence of focal airspace consolidation, pleural effusion or pneumothorax. Osseous structures are without acute abnormality. Soft tissues are grossly normal. IMPRESSION: Low lung volumes with bibasilar atelectasis and likely bilateral small subpulmonic effusions. Placement of feeding catheter, tip collimated off the image. Electronically Signed   By: Fidela Salisbury M.D.   On: 05/18/2015 10:59   Dg Chest Port 1 View  05/11/2015  CLINICAL DATA:  Cough today.  Fever.  Follow-up exam. EXAM: PORTABLE CHEST 1 VIEW COMPARISON:  05/09/2015. FINDINGS: There has been an interval increase in right lung base opacity. Although this likely atelectasis, pneumonia is possible. Mild persistent opacity at the left lung base is consistent stable  atelectasis. No evidence of pulmonary edema. No convincing pleural effusion no pneumothorax. The right PICC is stable with its tip at the caval atrial junction. IMPRESSION: Increased right lung base opacity may reflect increased atelectasis, pneumonia or a combination. No other change. Electronically Signed   By: Lajean Manes M.D.   On: 05/11/2015 11:51   Dg Chest Port 1 View  05/09/2015  CLINICAL DATA:  Respiratory failure. EXAM: PORTABLE CHEST 1 VIEW COMPARISON:  05/06/2015. FINDINGS: Right PICC line and NG tube in good anatomic position. Low lung volumes with bibasilar and/or infiltrates left side greater than right again noted. No prominent pleural effusion. No pneumothorax. IMPRESSION: 1. Right PICC line and NG tube in good anatomic position. 2. Lung volumes with persistent bibasilar atelectasis and/or infiltrates, left side greater than right. No significant interim change. Electronically Signed   By: Marcello Moores  Register   On: 05/09/2015 07:15   Dg Chest Port 1 View  05/06/2015  CLINICAL DATA:  Acute respiratory failure. EXAM: PORTABLE CHEST 1 VIEW COMPARISON:  05/03/2015. FINDINGS: Interim removal of left IJ line . NG tube noted coiled in the stomach. Mediastinum hilar structures normal. Persistent bibasilar atelectasis. No pleural effusion or pneumothorax. IMPRESSION: 1. Interim removal of left IJ line.  NG tube coiled in stomach. 2.  Persistent bibasilar atelectasis. Electronically Signed   By: [place]   On: 05/06/2015 07:30   Dg Chest Port 1 View  05/03/2015  CLINICAL DATA:  Follow-up respiratory failure.  Extubated. EXAM: PORTABLE CHEST 1 VIEW COMPARISON:  05/01/2014 FINDINGS: Endotracheal tube has been removed. Nasogastric tube is been removed. Left internal jugular central line has withdrawn somewhat, with the tip curled into the azygos vein. Patchy volume loss in the right lower lobe has improved but does persist to a degree. Partial collapse of left lower lobe is slightly improved. No  worsening or new findings otherwise. IMPRESSION: Endotracheal tube and nasogastric tube removed. Central line pulled back somewhat with the tip in the azygos vein. Persistent volume loss in both lower lobes, with some improved aeration since yesterday. Electronically Signed   By: Nelson Chimes M.D.   On: 05/03/2015 07:30   Dg Chest Port 1 View  05/02/2015  CLINICAL DATA:  Respiratory failure. EXAM: PORTABLE CHEST 1 VIEW COMPARISON:  05/01/2015 . FINDINGS: Endotracheal tube, NG tube, left IJ line in stable position. Persistent right base infiltrate and/or on. New left base infiltrate and/or atelectasis. Low lung volumes with atelectasis. No pleural effusion or pneumothorax. Heart size stable. IMPRESSION: 1. Lines and tubes in stable position. 2. Lung volumes with persistent right base infiltrate and/or atelectasis and new left base infiltrate and/or atelectasis Electronically Signed   By: [place]   On: 05/02/2015 07:08   Dg Chest Port 1 View  05/01/2015  CLINICAL DATA:  Central line placement EXAM: PORTABLE CHEST 1 VIEW COMPARISON:  05/01/2015 FINDINGS: Endotracheal tube 1.3 cm from carina unchanged. Interval introduction of a LEFT central venous line with tip in the distal SVC versus proximal RIGHT atrium. No pneumothorax. Low lung volumes. IMPRESSION: 1. Introduction of a LEFT central venous line with tip in the distal SVC / cavoatrial junction. 2. Endotracheal tube unchanged in position. 3. Low lung volumes. Electronically Signed   By: Suzy Bouchard M.D.   On: 05/01/2015 17:50   Dg Chest Port 1 View  05/01/2015  CLINICAL DATA:  65 year old female with acute respiratory failure status post endotracheal tube placement. EXAM: PORTABLE CHEST 1 VIEW COMPARISON:  No priors. FINDINGS: An endotracheal tube is in place with tip 1.7 cm above the carina. Nasogastric tube extends into the distal third of the esophagus. Lung volumes are low. Bibasilar opacities may reflect areas of atelectasis and/or  consolidation. Small left pleural effusion. No evidence of pulmonary edema. Heart size is normal. The patient is rotated to the right on today's exam, resulting in distortion of the mediastinal contours and reduced diagnostic sensitivity and specificity for mediastinal pathology. No pneumothorax. IMPRESSION: 1. Support apparatus, as above. Endotracheal tube could be withdrawn approximately 3 cm for more optimal placement, and a nasogastric tube could be advanced at least 10-15 cm for more optimal placement. 2. Low lung volumes with bibasilar areas of atelectasis and/or consolidation and small left pleural effusion. Electronically Signed   By: Vinnie Langton M.D.   On: 05/01/2015 12:18   Dg Abd Portable 1v  05/05/2015  CLINICAL DATA:  Check nasogastric catheter placement EXAM: PORTABLE ABDOMEN - 1 VIEW COMPARISON:  None. FINDINGS: Nasogastric catheter is coiled within the stomach. Multiple dilated loops of small bowel are noted. IMPRESSION: Nasogastric catheter within the stomach. Electronically Signed   By: Inez Catalina M.D.   On: 05/05/2015 16:57   Dg Naso/oro Gtube Thru Duo-reposition  05/17/2015  CLINICAL DATA:  Necrotizing pancreatitis  EXAM: POST PYLORIC FEEDING TUBE PLACEMENT UNDER FLUOROSCOPY: TECHNIQUE: Under intermittent fluoroscopic visualization, a nasojejunal feeding tube was advanced from the right nares to the distal duodenum. Small contrast injection confirms appropriate positioning. Tube was secured externally and flushed. No immediate complication. IMPRESSION: Technically successful feeding tube placement to the distal duodenum, ready for post pyloric use. Electronically Signed   By: Lucrezia Europe M.D.   On: 05/17/2015 13:47   US Abdomen Limited Ruq  04/28/2015  CLINICAL DATA:  Elevated LFTs. Acute pancreatitis noted on CT, with concern for underlying gallstones. Initial encounter. EXAM: US ABDOMEN LIMITED - RIGHT UPPER QUADRANT COMPARISON:  CT of the abdomen and pelvis performed earlier today  at 2:28 p.m. FINDINGS: Gallbladder: Gallbladder wall thickening is noted, with mild pericholecystic fluid. This is nonspecific in the presence of the patient's known pancreatitis. Multiple stones are seen at the gallbladder fundus, measuring up to 5 mm, and a 5 mm stone is seen lodged at the neck of the gallbladder. No ultrasonographic Murphy's sign is elicited. Common bile duct: Diameter: 0.4 cm, within normal limits in caliber. No obstructing stone is seen. Liver: No focal lesion identified. Within normal limits in parenchymal echogenicity. Assess trace ascites about the liver reflects the patient's known pancreatitis. IMPRESSION: 1. Gallbladder wall thickening, with mild pericholecystic fluid. This is nonspecific in the presence of the patient's known pancreatitis. Stones noted within the gallbladder, with a 5 mm stone lodged at the neck of the gallbladder. Mild acute cholecystitis cannot be entirely excluded, but is considered less likely. 2. No evidence for bile duct obstruction. The patient's apparent duodenal diverticulum is not well characterized on ultrasound. 3. Trace ascites about the liver reflects the patient's known pancreatitis. Electronically Signed   By: Garald Balding M.D.   On: 04/28/2015 21:56     ALLERGIES: No Known Allergies  Current Inpatient Medications:  Scheduled: . antiseptic oral rinse  7 mL Mouth Rinse q12n4p  . aspirin  81 mg Oral Daily  . chlorhexidine  15 mL Mouth Rinse BID  . heparin  5,000 Units Subcutaneous 3 times per day  . insulin aspart  0-15 Units Subcutaneous 6 times per day  . insulin glargine  20 Units Subcutaneous BID  . meropenem (MERREM) IV  1 g Intravenous Q12H  . metoprolol  5 mg Intravenous 4 times per day  . multivitamin  5 mL Per Tube Daily  . ranitidine  150 mg Per Tube Daily  . sodium chloride  10-40 mL Intracatheter Q12H  . sodium chloride  3 mL Intravenous Q12H   Continuous: . feeding supplement (VITAL 1.5 CAL) 1,000 mL (05/24/15 0531)  .   sodium bicarbonate  infusion 1000 mL 40 mL/hr at 05/23/15 0754   KDP:TELMRAJHHIDUP (TYLENOL) oral liquid 160 mg/5 mL, [DISCONTINUED] acetaminophen **OR** acetaminophen, bismuth subsalicylate, diphenhydrAMINE, fentaNYL (SUBLIMAZE) injection, hydrALAZINE, levalbuterol, metoprolol, ondansetron (ZOFRAN) IV **OR** ondansetron (ZOFRAN) IV, promethazine, sodium chloride    Marlyce Mcdougald  [facility] Pager [phone]  05/24/2015, 12:56 PM

## 2015-05-24 NOTE — Progress Notes (Signed)
Patient ID: Victoria Morrow, female   DOB: 08/29/1950, 65 y.o.   MRN: [id number]  General Surgery [facility]  Subjective: Patient in bed, responsive, nurse at bedside.  No complaints this AM.  Objective: Vital signs in last 24 hours: Temp:  [98 F (36.7 C)-99.8 F (37.7 C)] 99.3 F (37.4 C) (01/27 0400) Pulse Rate:  [104-131] 119 (01/27 0600) Resp:  [22-37] 25 (01/27 0600) BP: (99-136)/(56-85) 118/71 mmHg (01/27 0600) SpO2:  [94 %-100 %] 95 % (01/27 0600) Last BM Date: 05/23/15  Intake/Output from previous day: 01/26 0701 - 01/27 0700 In: 2252.8 [I.V.:920; NG/GT:683.2; IV Piggyback:200; TPN:449.7] Out: 1800 [Urine:1800] Intake/Output this shift:    Physical Exam: HEENT - sclerae clear, mucous membranes moist Neck - soft Chest - shallow bilaterally Cor - mild tachycardia Abdomen - mild to moderate distension; BS present; non-tender Ext - no edema, non-tender Neuro - alert & oriented, no focal deficits  Lab Results:   Recent Labs  05/23/15 0445 05/24/15 0416  WBC 14.0* 12.3*  HGB 8.0* 8.0*  HCT 25.0* 24.7*  PLT 465* 483*   BMET  Recent Labs  05/23/15 0445 05/24/15 0416  NA 146* 146*  K 3.8 4.0  CL 112* 109  CO2 21* 26  GLUCOSE 132* 151*  BUN 27* 23*  CREATININE 0.92 1.16*  CALCIUM 10.1 10.1   PT/INR No results for input(s): LABPROT, INR in the last 72 hours. Comprehensive Metabolic Panel:    Component Value Date/Time   NA 146* 05/24/2015 0416   NA 146* 05/23/2015 0445   K 4.0 05/24/2015 0416   K 3.8 05/23/2015 0445   CL 109 05/24/2015 0416   CL 112* 05/23/2015 0445   CO2 26 05/24/2015 0416   CO2 21* 05/23/2015 0445   BUN 23* 05/24/2015 0416   BUN 27* 05/23/2015 0445   CREATININE 1.16* 05/24/2015 0416   CREATININE 0.92 05/23/2015 0445   GLUCOSE 151* 05/24/2015 0416   GLUCOSE 132* 05/23/2015 0445   CALCIUM 10.1 05/24/2015 0416   CALCIUM 10.1 05/23/2015 0445   CALCIUM 10.5* 05/16/2015 1010   AST 67* 05/23/2015 0445    AST 56* 05/21/2015 0420   ALT 52 05/23/2015 0445   ALT 48 05/21/2015 0420   ALKPHOS 338* 05/23/2015 0445   ALKPHOS 317* 05/21/2015 0420   BILITOT 0.7 05/23/2015 0445   BILITOT 0.6 05/21/2015 0420   PROT 6.2* 05/23/2015 0445   PROT 6.1* 05/21/2015 0420   ALBUMIN 1.7* 05/23/2015 0445   ALBUMIN 1.6* 05/21/2015 0420    Studies/Results: Ct Abdomen Pelvis W Contrast  05/23/2015  CLINICAL DATA:  65 year old female with history of necrotizing pancreatitis. Abdominal pain. Re-evaluate fluid collection. EXAM: CT ABDOMEN AND PELVIS WITH CONTRAST TECHNIQUE: Multidetector CT imaging of the abdomen and pelvis was performed using the standard protocol following bolus administration of intravenous contrast. CONTRAST:  OMNIPAQUE IOHEXOL 300 MG/ML  SOLN COMPARISON:  Multiple priors, most recently 05/16/2015. FINDINGS: Lower chest: Trace right and small left pleural effusions. Areas of passive atelectasis are noted in the lower lobes of the lungs bilaterally. Central venous catheter in the superior vena cava low with tip terminating in the right atrium. Borderline cardiomegaly. Feeding tube in the distal esophagus extending below the diaphragm. Hepatobiliary: Mild periportal edema. Note suspicious cystic or solid hepatic lesions. No intra or extrahepatic biliary ductal dilatation. Numerous tiny calcified gallstones lying dependently in the gallbladder. Gallbladder wall appears diffusely thickened with surrounding inflammatory changes, likely reactive to the pancreatic process. Gallbladder is nearly completely decompressed,  suggesting strongly against cholecystitis. 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 This collection is highly  infiltrative extending throughout the retroperitoneum and is extremely large and difficult to accurately measure. When compared in a similar fashion to the prior study, the AP dimension of the collection appears essentially unchanged measuring 5.7 cm (image 38 of series 2). On the left side of the abdomen, this extends into the immediate subsplenic region, and into the left pericolic gutter posterior to the descending colon in a similar fashion to the prior examination. On the right side of the abdomen, the collection also extends into the subhepatic region immediately anterior to the right kidney, and posterior to the ascending colon and cecum, also very similar to the prior examination, where the largest pocket of fluid currently measures 7.0 x 9.2 cm (previously 6.4 x 8.4 cm). This pseudocyst comes in close proximity to the gallbladder, and the surrounding inflammatory changes associated with the large pseudocyst are inseparable from the inflammatory changes associated with the gallbladder. Some of the fluid also dissects into the root of the small bowel mesentery, best appreciated on image 51 of series 2, also similar to the prior examination. Spleen: Unremarkable. Adrenals/Urinary Tract: Left kidney is normal in appearance. Multifocal cortical thinning in the right kidney, presumably areas of post infectious or inflammatory scarring. No hydroureteronephrosis. Urinary bladder is nearly completely decompressed with a Foley balloon catheter in place. Small amount of gas in the urinary bladder, presumably iatrogenic. Bilateral adrenal glands are normal in appearance. Stomach/Bowel: Normal appearance of the stomach. No pathologic dilatation of small bowel or colon. Feeding tube terminating in the proximal jejunum. Colonic wall thickening in the region of the hepatic flexure, presumably reactive secondary to adjacent inflammation associated with the pseudocyst. Vascular/Lymphatic: Atherosclerosis throughout the  abdominal and pelvic vasculature, without evidence of aneurysm or dissection. Splenic vein remains patent at this time. Superior mesenteric vein is patent. Proximal portal vein is markedly narrowed just distal to the splenoportal confluence, best appreciated on image 35 of series 2, and on coronal images 53 and 54 of series 602, which has significantly increased compared to the prior examination. No lymphadenopathy noted in the abdomen or pelvis. Multiple borderline enlarged omental lymph nodes are presumably reactive. Reproductive: Uterus and ovaries are unremarkable in appearance. Other: Trace volume of ascites.  No pneumoperitoneum. Musculoskeletal: There are no aggressive appearing lytic or blastic lesions noted in the visualized portions of the skeleton. IMPRESSION: 1. 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 This is exerting considerable mass effect upon the proximal portal vein which appears severely narrowed on today's examination, which has significantly increased compared to the prior study. Portal vein, superior mesenteric vein and splenic vein all remain grossly patent at this time, however, the these findings are highly concerning for impending portal vein thrombosis. 2. Inflammatory changes around the gallbladder, favored to be reactive. Although there is cholelithiasis, findings are not favored to reflect an acute cholecystitis at this time. 3. Trace right and small left pleural effusions. 4. Additional incidental findings, similar to the prior study, as above. These results were called by telephone at the time of interpretation on 05/23/2015 at 11:22 am to nurse Tresa Endo  Dochery (for Dr. Darnell Level), who verbally acknowledged these results. Subsequently, this was also discussed by phone with Dr. Gerrit Friends on 05/23/2015 at 11:32 a.m. Electronically Signed   By: Trudie Reed M.D.    On: 05/23/2015 11:32    Assessment & Plans: Acute biliary pancreatitis with necrosis, cholelithiasis, early pseudocyst formation TF's to goal per nutritionist recs Wean TNA per pharmacy Empiric abx per medical service CT findings reviewed with radiology, IR, and medical service  Discussing options for further management.  Patient actually gradually improving on current regimen.  Concern regarding CT findings and radiologists worries about portal venous thrombosis due to compression.  Discussing options for early pseudocyst management.  Dr. Rito Ehrlich to contact [facility] medical/GI service for further information this AM.  If no options, I will contact surgeons at [facility] or [facility] for further information, possible referral and transfer, if they can provide service from GI/IR/surgery that we cannot provide here.  Velora Heckler, MD, [facility] Office: [phone]   Vegas Coffin Judie Petit 05/24/2015

## 2015-05-24 NOTE — Progress Notes (Signed)
Pharmacy Antibiotic Follow-up Note  Victoria Morrow is a 65 y.o. year-old female admitted on 04/28/2015.  The patient is currently on day 27 of antibiotics and specifically day 23 of meropenem for pancreatitis.  Assessment/Plan:  Tmax 99.3, WBC remain elevated but improved at 12.3, SCr stable at 1.1 w/ CrCl ~ 42 ml/min  Continue  meropenem 1g IV q12h based on renal function.  Temp (24hrs), Avg:99.1 F (37.3 C), Min:98 F (36.7 C), Max:99.8 F (37.7 C)   Recent Labs Lab 05/19/15 0456 05/20/15 0500 05/21/15 0420 05/23/15 0445 05/24/15 0416  WBC 16.2* 16.3* 14.4* 14.0* 12.3*     Recent Labs Lab 05/20/15 0500 05/21/15 0420 05/22/15 0430 05/23/15 0445 05/24/15 0416  CREATININE 0.98 1.01* 1.01* 0.92 1.16*   Estimated Creatinine Clearance: 42 mL/min (by C-G formula based on Cr of 1.16).    No Known Allergies  Antimicrobials this admission: 1/1 >> Primaxin  >> 1/5 1/3 >> Rocephin >> 1/4 1/3 >> Flagyl >> 1/5 1/5 >> meropenem >>  1/6 >> PO vanc >> 1/7 1/15 >> Fluconazole >> 1/19 (For yeast in UA)  Levels/dose changes this admission: 1/20 Increase meropenem to 1g q8h for improved renal fxn. 1/21 meropenem adjusted back to 1g q12h for decreased renal fxn  Microbiology results: 1/1 BCx: NGF 1/1 MRSA PCR: neg 1/2 urine: >100k Klebsiella pneumo (R-amp) 1/5 blood: 1/2 NGF, other canceled 1/5 trach asp: NGF 1/6 C diff: neg 1/14 blood x2: NGF 1/14 UA: yeast 1/15 peritoneal fluid: NGF 1/21 Cdiff PCR:   Thank you for allowing pharmacy to be a part of this patient's care.  Arley Phenix RPh 05/24/2015, 9:24 AM Pager [phone]

## 2015-05-24 NOTE — Progress Notes (Signed)
[id number] Davis,BSN,RN,CCM: Patient transferred to [facility] via care link for further treatment.

## 2015-05-24 NOTE — Progress Notes (Addendum)
[facility] PROGRESS NOTE  LEENA TIEDE ONG:[id number] DOB: [birth year] DOA: 04/28/2015  PCP: Rachell Cipro, MD  Brief HPI:  65 year old African-American female with a past medical history of type 2 diabetes on insulin, chronic kidney disease of unknown stage, presented with abdominal pain, nausea and vomiting. She was found to have severe pancreatitis thought to be biliary in origin. She was hospitalized for further management. Critical care medicine was consulted.  1/4 vomited, aspirated, intubated, shock Required neo since 1/7, transferred care under critical care 1/5 extubated 1/7 SVT requiring cardizem gtt 1/8 Vomiting - NG to LIS 1/12 off cardizem gtt 1/14 care transferred to [facility], with critical care and general surgery following .Marland Kitchen  Patient getting treatment for pancreatitis, pancreatic necrosis. She is on IV antibiotics, WBC fluctuates, spikes fevers. She was started on tube feeding.   Past medical history:  Past Medical History  Diagnosis Date  . Olivopontocerebellar atrophy (HCC)     Mgmt Dr Brett Fairy  . Diabetes mellitus     Type II. Insulin dependent. Diabetic nephropathy.    Consultants:   General Surgery  Urology  CCM  Procedures:   Intubation /extubation/ng  Central line  tpn  US guided para on 1/15  Antibiotics: Anti-infectives    Start     Dose/Rate Route Frequency Ordered Stop   05/18/15 2200  meropenem (MERREM) 1 g in sodium chloride 0.9 % 100 mL IVPB     1 g 200 mL/hr over 30 Minutes Intravenous Every 12 hours 05/18/15 0859     05/17/15 1800  meropenem (MERREM) 1 g in sodium chloride 0.9 % 100 mL IVPB  Status:  Discontinued     1 g 200 mL/hr over 30 Minutes Intravenous Every 8 hours 05/17/15 1022 05/18/15 0859   05/13/15 1000  fluconazole (DIFLUCAN) IVPB 100 mg     100 mg 50 mL/hr over 60 Minutes Intravenous Every 24 hours 05/12/15 0813 05/16/15 1300   05/12/15 1000  fluconazole (DIFLUCAN) IVPB 200 mg     200 mg 100 mL/hr  over 60 Minutes Intravenous  Once 05/12/15 0813 05/12/15 1029   05/08/15 2200  meropenem (MERREM) 1 g in sodium chloride 0.9 % 100 mL IVPB  Status:  Discontinued     1 g 200 mL/hr over 30 Minutes Intravenous Every 12 hours 05/08/15 1054 05/17/15 1022   05/03/15 1200  vancomycin (VANCOCIN) 50 mg/mL oral solution 500 mg  Status:  Discontinued     500 mg Oral 4 times per day 05/03/15 0917 05/04/15 1306   05/02/15 1200  cefTRIAXone (ROCEPHIN) 1 g in dextrose 5 % 50 mL IVPB  Status:  Discontinued     1 g 100 mL/hr over 30 Minutes Intravenous Every 24 hours 05/01/15 1448 05/01/15 2004   05/02/15 0600  imipenem-cilastatin (PRIMAXIN) 250 mg in sodium chloride 0.9 % 100 mL IVPB  Status:  Discontinued     250 mg 200 mL/hr over 30 Minutes Intravenous 3 times per day 05/01/15 2025 05/02/15 0027   05/02/15 0030  meropenem (MERREM) 500 mg in sodium chloride 0.9 % 50 mL IVPB  Status:  Discontinued     500 mg 100 mL/hr over 30 Minutes Intravenous Every 12 hours 05/02/15 0027 05/08/15 1054   05/01/15 2030  imipenem-cilastatin (PRIMAXIN) 250 mg in sodium chloride 0.9 % 100 mL IVPB     250 mg 200 mL/hr over 30 Minutes Intravenous NOW 05/01/15 2025 05/01/15 2110   04/30/15 1100  metroNIDAZOLE (FLAGYL) IVPB 500 mg  Status:  Discontinued  500 mg 100 mL/hr over 60 Minutes Intravenous Every 12 hours 04/30/15 0951 05/02/15 0751   04/30/15 1000  cefTRIAXone (ROCEPHIN) 1 g in dextrose 5 % 50 mL IVPB  Status:  Discontinued     1 g 100 mL/hr over 30 Minutes Intravenous Every 24 hours 04/30/15 0939 05/01/15 1205   04/30/15 0945  cefTRIAXone (ROCEPHIN) injection 1 g  Status:  Discontinued     1 g Intramuscular Every 24 hours 04/30/15 0930 04/30/15 0938   04/29/15 0200  imipenem-cilastatin (PRIMAXIN) 250 mg in sodium chloride 0.9 % 100 mL IVPB  Status:  Discontinued     250 mg 200 mL/hr over 30 Minutes Intravenous Every 8 hours 04/28/15 1812 04/30/15 0930   04/28/15 1830  imipenem-cilastatin (PRIMAXIN) 250 mg in  sodium chloride 0.9 % 100 mL IVPB     250 mg 200 mL/hr over 30 Minutes Intravenous  Once 04/28/15 1812 04/28/15 1904       Subjective: Patient feels about the same as yesterday. Denies any abdominal pain, nausea and vomiting.   Objective: Vital Signs  Filed Vitals:   05/24/15 0001 05/24/15 0200 05/24/15 0400 05/24/15 0600  BP: 105/56 114/64 99/56 118/71  Pulse: 123 115 123 119  Temp:   99.3 F (37.4 C)   TempSrc:   Oral   Resp: 22 34 25 25  Height:      Weight:      SpO2: 95% 97% 94% 95%    Intake/Output Summary (Last 24 hours) at 05/24/15 0741 Last data filed at 05/24/15 0500  Gross per 24 hour  Intake 2252.83 ml  Output   1800 ml  Net 452.83 ml   Filed Weights   05/21/15 0426 05/22/15 0422 05/23/15 0500  Weight: 64.3 kg (141 lb 12.1 oz) 65.5 kg (144 lb 6.4 oz) 64.4 kg (141 lb 15.6 oz)    General appearance: alert, cooperative,  Resp: Diminished air entry at the bases. No crackles, wheezing. No rhonchi. Cardio: regular rate and rhythm, S1, S2 normal, no murmur, click, rub or gallop GI: Abdomen is mildly distended. Nontender. Bowel sounds are sluggish but present. No masses or organomegaly. Extremities: Minimal edema bilateral lower extremities. Neurologic: No focal deficits. She is alert and oriented 3. Moving all her extremities.  Lab Results:  Basic Metabolic Panel:  Recent Labs Lab 05/19/15 0456 05/20/15 0500 05/21/15 0420 05/22/15 0430 05/23/15 0445 05/24/15 0416  NA 143 141 145 142 146* 146*  K 3.6 3.7 3.5 3.5 3.8 4.0  CL 112* 113* 113* 110 112* 109  CO2 20* 18* 24 23 21* 26  GLUCOSE 126* 111* 134* 151* 132* 151*  BUN 32* 32* 29* 30* 27* 23*  CREATININE 0.99 0.98 1.01* 1.01* 0.92 1.16*  CALCIUM 9.6 10.3 9.7 8.9 10.1 10.1  MG 1.8 2.0 1.5* 1.9 1.8  --   PHOS 2.5 3.9 2.1* 2.6 3.2  --    Liver Function Tests:  Recent Labs Lab 05/19/15 0930 05/20/15 0500 05/21/15 0420 05/23/15 0445  AST  --  42* 56* 67*  ALT  --  45 48 52  ALKPHOS  --   294* 317* 338*  BILITOT  --  0.9 0.6 0.7  PROT  --  6.0* 6.1* 6.2*  ALBUMIN 1.6* 1.6* 1.6* 1.7*   CBC:  Recent Labs Lab 05/19/15 0456 05/20/15 0500 05/21/15 0420 05/23/15 0445 05/24/15 0416  WBC 16.2* 16.3* 14.4* 14.0* 12.3*  NEUTROABS  --  13.3*  --   --   --   HGB 9.3* 9.1*  8.5* 8.0* 8.0*  HCT 28.3* 27.5* 26.3* 25.0* 24.7*  MCV 86.0 85.7 88.3 88.7 88.8  PLT 584* 513* 527* 465* 483*    CBG:  Recent Labs Lab 05/23/15 1116 05/23/15 1516 05/23/15 1957 05/23/15 2345 05/24/15 0359  GLUCAP 80 121* 108* 137* 128*    Recent Results (from the past 240 hour(s))  C difficile quick scan w PCR reflex     Status: None   Collection Time: 05/18/15  8:38 AM  Result Value Ref Range Status   C Diff antigen NEGATIVE NEGATIVE Final   C Diff toxin NEGATIVE NEGATIVE Final   C Diff interpretation Negative for toxigenic C. difficile  Final  Urine culture     Status: None   Collection Time: 05/19/15 12:27 PM  Result Value Ref Range Status   Specimen Description URINE, CATHETERIZED  Final   Special Requests NONE  Final   Culture   Final    NO GROWTH 1 DAY Performed at [facility]    Report Status 05/20/2015 FINAL  Final      Studies/Results: Ct Abdomen Pelvis W Contrast  05/23/2015  CLINICAL DATA:  65 year old female with history of necrotizing pancreatitis. Abdominal pain. Re-evaluate fluid collection. EXAM: CT ABDOMEN AND PELVIS WITH CONTRAST TECHNIQUE: Multidetector CT imaging of the abdomen and pelvis was performed using the standard protocol following bolus administration of intravenous contrast. CONTRAST:  132m OMNIPAQUE IOHEXOL 300 MG/ML  SOLN COMPARISON:  Multiple priors, most recently 05/16/2015. FINDINGS: Lower chest: Trace right and small left pleural effusions. Areas of passive atelectasis are noted in the lower lobes of the lungs bilaterally. Central venous catheter in the superior vena cava low with tip terminating in the right atrium. Borderline cardiomegaly.  Feeding tube in the distal esophagus extending below the diaphragm. Hepatobiliary: Mild periportal edema. Note suspicious cystic or solid hepatic lesions. No intra or extrahepatic biliary ductal dilatation. Numerous tiny calcified gallstones lying dependently in the gallbladder. Gallbladder wall appears diffusely thickened with surrounding inflammatory changes, likely reactive to the pancreatic process. Gallbladder is nearly completely decompressed, suggesting strongly against cholecystitis. 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 This collection is highly infiltrative extending throughout the retroperitoneum and is extremely large and difficult to accurately measure. When compared in a similar fashion to the prior study, the AP dimension of the collection appears essentially unchanged measuring 5.7 cm (image 38 of series 2). On the left side of the abdomen, this extends into the immediate subsplenic region, and into the left pericolic gutter posterior to the descending colon in a similar fashion to the prior examination. On the right side of the abdomen, the collection also extends into the subhepatic region immediately anterior to the right kidney, and posterior to the ascending colon and cecum, also very similar to the prior examination, where the largest pocket of fluid currently measures 7.0 x 9.2 cm (previously 6.4 x 8.4 cm). This pseudocyst comes in close proximity to the gallbladder, and the surrounding inflammatory changes associated with the large pseudocyst are inseparable from the inflammatory changes associated with the gallbladder. Some of the fluid also dissects into the root of the small bowel mesentery, best  appreciated on image 51 of series 2, also similar to the prior examination. Spleen: Unremarkable. Adrenals/Urinary Tract:  Left kidney is normal in appearance. Multifocal cortical thinning in the right kidney, presumably areas of post infectious or inflammatory scarring. No hydroureteronephrosis. Urinary bladder is nearly completely decompressed with a Foley balloon catheter in place. Small amount of gas in the urinary bladder, presumably iatrogenic. Bilateral adrenal glands are normal in appearance. Stomach/Bowel: Normal appearance of the stomach. No pathologic dilatation of small bowel or colon. Feeding tube terminating in the proximal jejunum. Colonic wall thickening in the region of the hepatic flexure, presumably reactive secondary to adjacent inflammation associated with the pseudocyst. Vascular/Lymphatic: Atherosclerosis throughout the abdominal and pelvic vasculature, without evidence of aneurysm or dissection. Splenic vein remains patent at this time. Superior mesenteric vein is patent. Proximal portal vein is markedly narrowed just distal to the splenoportal confluence, best appreciated on image 35 of series 2, and on coronal images 53 and 54 of series 602, which has significantly increased compared to the prior examination. No lymphadenopathy noted in the abdomen or pelvis. Multiple borderline enlarged omental lymph nodes are presumably reactive. Reproductive: Uterus and ovaries are unremarkable in appearance. Other: Trace volume of ascites.  No pneumoperitoneum. Musculoskeletal: There are no aggressive appearing lytic or blastic lesions noted in the visualized portions of the skeleton. IMPRESSION: 1. 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 This is exerting considerable mass effect upon the proximal portal vein which appears severely narrowed on today's examination, which has  significantly increased compared to the prior study. Portal vein, superior mesenteric vein and splenic vein all remain grossly patent at this time, however, the these findings are highly concerning for impending portal vein thrombosis. 2. Inflammatory changes around the gallbladder, favored to be reactive. Although there is cholelithiasis, findings are not favored to reflect an acute cholecystitis at this time. 3. Trace right and small left pleural effusions. 4. Additional incidental findings, similar to the prior study, as above. These results were called by telephone at the time of interpretation on 05/23/2015 at 11:22 am to nurse Bridgett Larsson (for Dr. Armandina Gemma), who verbally acknowledged these results. Subsequently, this was also discussed by phone with Dr. Harlow Asa on 05/23/2015 at 11:32 a.m. Electronically Signed   By: Vinnie Langton M.D.   On: 05/23/2015 11:32    Medications:  Scheduled: . antiseptic oral rinse  7 mL Mouth Rinse q12n4p  . aspirin  81 mg Oral Daily  . chlorhexidine  15 mL Mouth Rinse BID  . heparin  5,000 Units Subcutaneous 3 times per day  . insulin aspart  0-15 Units Subcutaneous 6 times per day  . insulin glargine  20 Units Subcutaneous BID  . meropenem (MERREM) IV  1 g Intravenous Q12H  . metoprolol  5 mg Intravenous 4 times per day  . multivitamin  5 mL Per Tube Daily  . ranitidine  150 mg Per Tube Daily  . sodium chloride  10-40 mL Intracatheter Q12H  . sodium chloride  3 mL Intravenous Q12H   Continuous: . feeding supplement (VITAL 1.5 CAL) 1,000 mL (05/24/15 0531)  .  sodium bicarbonate  infusion 1000 mL 40 mL/hr at 05/23/15 0754   JSE:GBTDVVOHYWVPX (TYLENOL) oral liquid 160 mg/5 mL, [DISCONTINUED] acetaminophen **OR** acetaminophen, bismuth subsalicylate, diphenhydrAMINE, fentaNYL (SUBLIMAZE) injection, hydrALAZINE, levalbuterol, metoprolol, ondansetron (ZOFRAN) IV **OR** ondansetron (ZOFRAN) IV, promethazine, sodium chloride  Assessment/Plan:  Principal  Problem:   Acute necrotizing pancreatitis with SIRS Active Problems:   Gallstones   Transaminitis   DM type 2 (diabetes mellitus,  type 2) ([place])   Hyperlipidemia   ARF (acute renal failure) (HCC)   Acute respiratory failure with hypoxia (HCC)   Aspiration pneumonia due to vomit ([place])   Acute on chronic respiratory failure (HCC)   Protein-calorie malnutrition, moderate (HCC)   Umbilical hernia   Chronic kidney disease (CKD), stage III (moderate)   Olivopontocerebellar atrophy (HCC)   Abscess   Fever   Necrotizing pancreatitis   Acute respiratory distress (HCC)    Necrotizing pancreatitis -initial CT scan with evidence of gallstones. Patient denies any history of alcohol intake. Ultrasound of the abdomen demonstrated GB thickening with pericholecystic fluid and 48m stone lodged in neck of gallbladder and no Murphy's sign. CBD is normal size.  -General surgery is following. No clear evidence for acute cholecystitis.  -Patient is currently on meropenem, which will be continued. -Patient not able to tolerate MRI of abdomen due to not able to hold breath for longer duration.  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  - Feeding tube was placed nasally, and patient was started on feeds - CT scan was repeated, 1/26. Once again shows the changes of her necrotizing pancreatitis. 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 Please see report for details. - The above CT scan was discussed with the general surgery. Dr. GHarlow Asawho has discussed this with the gastroenterology and IR. Considering compression being caused by this fluid collection there may be some benefit to draining this collection, however, no such expertise exists in our hospital system. So the surgeon and I agreed that we should do pursue sending the patient to a  tertiary care center. [facility]alia [facility] He agrees that the patient may benefit from some intervention. He along with the pulmonologist at same facility, Dr. DBeryl Meagerhas accepted the [facility] This was discussed in detail with the patient as well as her husband. Both are agreeable with this plan.  Tachypnea/recent aspiration requiring intubation Most likely secondary to acute illness.  Patient was intubated in the intensive care unit after she aspirated. Has been stable. Critical care medicine was reconsulted. They have been monitoring closely. Patient remains stable. Tachypnea has improved.  Fever Fever is most likely secondary to acute severe pancreatitis. WBC is better. Urine culture from January 22 did not show any growth. Aspirate from peritoneal fluid does not show any growth. Blood cultures have been negative so far.IR was consulted for fluids collection drain and culture. UKoreaguided para was done on 1/15. C. difficile was negative. Chest x-ray showed early evidence for aspiration. No new infiltrative processes noted.  Metabolic acidosis Related to pancreatitis. On Bicarb infusion. Improved.  Hypercalcemia Possibly related to pancreatitis. PTH normal , Vitamin D level low. Continue with IV fluids. Patient also received calcitonin. Calcium level is improved.  Anemia Likely anemia of chronic disease. She was transfused 2 units of blood. Hemoglobin is stable.  Hypomagnesemia Replaced.  Septic shock Required pressors. Now off pressors. Blood pressure is stable.  VDRF Intubated on 1/14 and extubated on 1/15. Remains stable.  PAF/SVT Required cardizem drip, now on lopressor. [facility]60%, dynamic obstruction reported, grade 1 diastolic dysfunction reported. Increased betablocker iv lopressor  from q8hrs to q6hr on 1/17. Continue monitor  bp and heart rate.  UTI Kleb pneumonia. Finished abx treatment. Repeat cultures were negative.  Urinary retention Urology consulted initially during hospitalization due to difficult foley placed placement. She is found to have grade 2 cystocele and urethral stenosis. Outpatient urology follow-up. We'll need to leave Foley catheter in for now.  ARF on CKD III Creatinine was back to baseline. Noted to be slightly elevated today. Continue with IV fluids. Continue to monitor urine output. Follow labs periodically.   Transaminitis LFTs are improved. Probably related to her necrotizing pancreatitis/sepsis/tpn. Ultrasound of the right upper quadrant as above. Hepatitis panel is negative. Continue to trend LFTs. Alk phosphatase still elevated.   Insulin dependent Diabetes mellitus type 2, poorly controlled, with chronic kidney disease HbA1c is 7.7.CBG's are reasonably well controlled. Continue Lantus.   Nutrition Patient has inadequate oral intake due to inability to eat. She was getting nutrition via TPN which has been weaned off as of 1/26. She is getting feeds via nasal feeding tube. Nutritionist is following.    DVT Prophylaxis: Subcutaneous heparin    Code Status: Full code  Family Communication: Discussed with the patient  Disposition Plan: Continue stepdown setting for now. Await transfer to [facility].    LOS: 26 days   [place] [facility] Pager [phone] 05/24/2015, 7:41 AM  If 7PM-7AM, please contact night-coverage at [email or web address], password [facility]

## 2015-09-03 ENCOUNTER — Other Ambulatory Visit: Payer: Self-pay

## 2015-09-03 DIAGNOSIS — E119 Type 2 diabetes mellitus without complications: Secondary | ICD-10-CM

## 2015-09-03 NOTE — Patient Outreach (Addendum)
[facility]) Care Management  09/03/2015  Ave Filternn C Burgen 1950/05/20 [id number]     Care Coordination   Voicemail message received from Danvillerystal,[facility]. She was inquiiring about referral placed on patient and wanted to know when RN CM would be making outreach call to patient. RN also reports that [facility] to [facility] follow [facility] and was recently [facility] for assistance. She voices concerns regarding caregiver being able to manage patients care needs in the home and possible need for placement.  RN CM placed return call to Crystal. Left confidential message advising that RN CM had spoken with patient/caregiver this am. [facility]. Also, advised and reviewed [facility]'s role as care coordinators and not home health nurses and scope of practice.   Victoria Fairyoshanda Jazleen Robeck, RN,BSN,CCM [facility] Care [facility] Telephonic Care Management Coordinator Direct Phone: [phone] Toll Free: [phone] Fax: [phone]

## 2015-09-03 NOTE — Patient Outreach (Addendum)
[facility]) Care Management  09/03/2015  Victoria Morrow 1950-08-12 [id number]   Telephone Screen  Referral Date: 09/02/15 Referral Source: [facility] Referral Reason: " patient has had 2 extended length of stay visits, has drains, tube feedings and IV antibiotics, [facility], patient and caregiver need a lot of education and support as it relates to her ongoing care and diabetes glucose control"   Outreach attempt # 1 to patient. Spoke with patient. Patient having difficulty speaking and requested call be completed with caregiver-[facility].   Social: Patient resides in her home along with spouse. Spouse is not home during the day. Caregiver states that she is there for about 3-4hrs intervals twice a day. [facility] as well. She requires assistance with managing her ADLs/IADLs. Patient walks with walker. Denies any recent falls. Caregiver or spouse is taking patient to medical appts.  Conditions: Patient has extensive medical history. She has h/o: HLD, CKD stage 3, uncontrolled DM, umbilical hernia and necrotizing pancreatitis. [facility] following an extensive length of stay and having [facility] Caregiver states that patient has "two drains one coming from her stomach and the other from her back." she also has PICC line and getting intermittent IV antibiotics. patient has feeding tube and getting feedings and meds via tube. Blood sugars remain uncontrolled. She reports that cbg yesterday was 444 and today was 321. Caregiver states that PCP has advised them that they need to find endocrinologist to manage diabetes but they have not done so yet. Caregiver voices feeling completely overwhelmed with managing patient's care and all her "drains and tubes." She states she has voiced her concerns with HH nurse,Amanda. A recent SW referral for patient was placed via  [facility] as well for further assistance.   Medications: Caregiver states patient taking less than 15 meds,. Denies any issues with affordability. She states she is administering all meds via feeding tube.   Appointments: Caregiver voices that they cancelled upcoming PCP appt for 09/10/15 due to [facility]. Advised caregiver to reschedule PCP as soon as possible for post hospitalization f/u.  Consent: Patient/caregiver gave verbal [facility]. CAREGIVER CONTACT INFO: Elie GoodyLINDA LIGGINS [phone].  Plan: RN CM will notify [facility] administrative assistant of case status. RN CM will send [facility] for TOC case. RN CM provided caregiver with 24hr Nurse Line contact info.   Antionette Fairyoshanda Borden Thune, RN,BSN,CCM [facility] Care Management Telephonic Care Management Coordinator Direct Phone: [phone] Toll Free: [phone] Fax: [phone]

## 2015-09-04 NOTE — Progress Notes (Signed)
This encounter was created in error - please disregard.

## 2015-09-05 ENCOUNTER — Encounter (HOSPITAL_COMMUNITY): Payer: Self-pay | Admitting: Emergency Medicine

## 2015-09-05 ENCOUNTER — Inpatient Hospital Stay (HOSPITAL_COMMUNITY)
Admission: EM | Admit: 2015-09-05 | Discharge: 2015-09-26 | DRG: 637 | Disposition: E | Payer: Commercial Managed Care - HMO | Attending: Internal Medicine | Admitting: Internal Medicine

## 2015-09-05 ENCOUNTER — Other Ambulatory Visit: Payer: Self-pay | Admitting: *Deleted

## 2015-09-05 ENCOUNTER — Emergency Department (HOSPITAL_COMMUNITY): Payer: Commercial Managed Care - HMO

## 2015-09-05 DIAGNOSIS — E870 Hyperosmolality and hypernatremia: Secondary | ICD-10-CM | POA: Diagnosis present

## 2015-09-05 DIAGNOSIS — K316 Fistula of stomach and duodenum: Secondary | ICD-10-CM | POA: Diagnosis present

## 2015-09-05 DIAGNOSIS — E1100 Type 2 diabetes mellitus with hyperosmolarity without nonketotic hyperglycemic-hyperosmolar coma (NKHHC): Secondary | ICD-10-CM | POA: Diagnosis present

## 2015-09-05 DIAGNOSIS — Z8249 Family history of ischemic heart disease and other diseases of the circulatory system: Secondary | ICD-10-CM | POA: Diagnosis not present

## 2015-09-05 DIAGNOSIS — N183 Chronic kidney disease, stage 3 unspecified: Secondary | ICD-10-CM | POA: Diagnosis present

## 2015-09-05 DIAGNOSIS — K8591 Acute pancreatitis with uninfected necrosis, unspecified: Secondary | ICD-10-CM

## 2015-09-05 DIAGNOSIS — Z841 Family history of disorders of kidney and ureter: Secondary | ICD-10-CM | POA: Diagnosis not present

## 2015-09-05 DIAGNOSIS — E876 Hypokalemia: Secondary | ICD-10-CM | POA: Diagnosis present

## 2015-09-05 DIAGNOSIS — E860 Dehydration: Secondary | ICD-10-CM | POA: Diagnosis not present

## 2015-09-05 DIAGNOSIS — R739 Hyperglycemia, unspecified: Secondary | ICD-10-CM | POA: Diagnosis not present

## 2015-09-05 DIAGNOSIS — N179 Acute kidney failure, unspecified: Secondary | ICD-10-CM | POA: Diagnosis not present

## 2015-09-05 DIAGNOSIS — Z681 Body mass index (BMI) 19 or less, adult: Secondary | ICD-10-CM | POA: Diagnosis not present

## 2015-09-05 DIAGNOSIS — K8689 Other specified diseases of pancreas: Secondary | ICD-10-CM | POA: Diagnosis present

## 2015-09-05 DIAGNOSIS — Z7982 Long term (current) use of aspirin: Secondary | ICD-10-CM

## 2015-09-05 DIAGNOSIS — E861 Hypovolemia: Secondary | ICD-10-CM | POA: Diagnosis present

## 2015-09-05 DIAGNOSIS — E43 Unspecified severe protein-calorie malnutrition: Secondary | ICD-10-CM | POA: Insufficient documentation

## 2015-09-05 DIAGNOSIS — G9340 Encephalopathy, unspecified: Secondary | ICD-10-CM | POA: Diagnosis present

## 2015-09-05 DIAGNOSIS — Z794 Long term (current) use of insulin: Secondary | ICD-10-CM

## 2015-09-05 DIAGNOSIS — E1165 Type 2 diabetes mellitus with hyperglycemia: Secondary | ICD-10-CM | POA: Diagnosis present

## 2015-09-05 DIAGNOSIS — K922 Gastrointestinal hemorrhage, unspecified: Secondary | ICD-10-CM | POA: Diagnosis not present

## 2015-09-05 DIAGNOSIS — E1169 Type 2 diabetes mellitus with other specified complication: Secondary | ICD-10-CM

## 2015-09-05 DIAGNOSIS — D62 Acute posthemorrhagic anemia: Secondary | ICD-10-CM

## 2015-09-05 DIAGNOSIS — K920 Hematemesis: Secondary | ICD-10-CM | POA: Diagnosis not present

## 2015-09-05 DIAGNOSIS — E871 Hypo-osmolality and hyponatremia: Secondary | ICD-10-CM | POA: Diagnosis present

## 2015-09-05 DIAGNOSIS — Z931 Gastrostomy status: Secondary | ICD-10-CM

## 2015-09-05 DIAGNOSIS — E440 Moderate protein-calorie malnutrition: Secondary | ICD-10-CM | POA: Diagnosis present

## 2015-09-05 DIAGNOSIS — E1122 Type 2 diabetes mellitus with diabetic chronic kidney disease: Secondary | ICD-10-CM | POA: Diagnosis present

## 2015-09-05 DIAGNOSIS — I129 Hypertensive chronic kidney disease with stage 1 through stage 4 chronic kidney disease, or unspecified chronic kidney disease: Secondary | ICD-10-CM | POA: Diagnosis present

## 2015-09-05 DIAGNOSIS — Z79899 Other long term (current) drug therapy: Secondary | ICD-10-CM

## 2015-09-05 DIAGNOSIS — E1121 Type 2 diabetes mellitus with diabetic nephropathy: Secondary | ICD-10-CM | POA: Diagnosis present

## 2015-09-05 DIAGNOSIS — R531 Weakness: Secondary | ICD-10-CM | POA: Diagnosis present

## 2015-09-05 LAB — COMPREHENSIVE METABOLIC PANEL
ALK PHOS: 1510 U/L — AB (ref 38–126)
ALT: 35 U/L (ref 14–54)
ANION GAP: 17 — AB (ref 5–15)
AST: 26 U/L (ref 15–41)
Albumin: 2.1 g/dL — ABNORMAL LOW (ref 3.5–5.0)
BILIRUBIN TOTAL: 0.7 mg/dL (ref 0.3–1.2)
BUN: 41 mg/dL — ABNORMAL HIGH (ref 6–20)
CALCIUM: 10.8 mg/dL — AB (ref 8.9–10.3)
CO2: 25 mmol/L (ref 22–32)
CREATININE: 1.72 mg/dL — AB (ref 0.44–1.00)
Chloride: 124 mmol/L — ABNORMAL HIGH (ref 101–111)
GFR calc non Af Amer: 30 mL/min — ABNORMAL LOW (ref >60)
GFR, EST AFRICAN AMERICAN: 34 mL/min — AB (ref >60)
GLUCOSE: 866 mg/dL — AB (ref 65–99)
Potassium: 2.8 mmol/L — ABNORMAL LOW (ref 3.5–5.1)
Sodium: 166 mmol/L (ref 135–145)
TOTAL PROTEIN: 6.8 g/dL (ref 6.5–8.1)

## 2015-09-05 LAB — GLUCOSE, CAPILLARY
GLUCOSE-CAPILLARY: 459 mg/dL — AB (ref 65–99)
GLUCOSE-CAPILLARY: 318 mg/dL — AB (ref 65–99)
GLUCOSE-CAPILLARY: 233 mg/dL — AB (ref 65–99)
Glucose-Capillary: 350 mg/dL — ABNORMAL HIGH (ref 65–99)
Glucose-Capillary: 155 mg/dL — ABNORMAL HIGH (ref 65–99)
Glucose-Capillary: 177 mg/dL — ABNORMAL HIGH (ref 65–99)

## 2015-09-05 LAB — BLOOD GAS, VENOUS
ACID-BASE EXCESS: 1 mmol/L (ref 0.0–2.0)
BICARBONATE: 25.9 meq/L — AB (ref 20.0–24.0)
O2 SAT: 56.2 %
PATIENT TEMPERATURE: 98.6
PO2 VEN: 33.9 mmHg (ref 31.0–45.0)
TCO2: 24.5 mmol/L (ref 0–100)
pCO2, Ven: 45.5 mmHg (ref 45.0–50.0)
pH, Ven: 7.374 — ABNORMAL HIGH (ref 7.250–7.300)

## 2015-09-05 LAB — BASIC METABOLIC PANEL
Anion gap: 16 — ABNORMAL HIGH (ref 5–15)
BUN: 40 mg/dL — ABNORMAL HIGH (ref 6–20)
BUN: 34 mg/dL — ABNORMAL HIGH (ref 6–20)
BUN: 32 mg/dL — ABNORMAL HIGH (ref 6–20)
CALCIUM: 10.3 mg/dL (ref 8.9–10.3)
CHLORIDE: 124 mmol/L — AB (ref 101–111)
CO2: 28 mmol/L (ref 22–32)
CO2: 25 mmol/L (ref 22–32)
CO2: 27 mmol/L (ref 22–32)
CREATININE: 1.66 mg/dL — AB (ref 0.44–1.00)
Calcium: 11.1 mg/dL — ABNORMAL HIGH (ref 8.9–10.3)
Calcium: 10.3 mg/dL (ref 8.9–10.3)
Chloride: >130 mmol/L (ref 101–111)
Creatinine, Ser: 1.23 mg/dL — ABNORMAL HIGH (ref 0.44–1.00)
Creatinine, Ser: 1.22 mg/dL — ABNORMAL HIGH (ref 0.44–1.00)
GFR calc non Af Amer: 31 mL/min — ABNORMAL LOW (ref >60)
GFR calc non Af Amer: 44 mL/min — ABNORMAL LOW (ref >60)
GFR, EST AFRICAN AMERICAN: 36 mL/min — AB (ref >60)
GFR, EST AFRICAN AMERICAN: 51 mL/min — AB (ref >60)
GFR, EST AFRICAN AMERICAN: 52 mL/min — AB (ref >60)
GFR, EST NON AFRICAN AMERICAN: 45 mL/min — AB (ref >60)
Glucose, Bld: 786 mg/dL (ref 65–99)
Glucose, Bld: 286 mg/dL — ABNORMAL HIGH (ref 65–99)
Glucose, Bld: 209 mg/dL — ABNORMAL HIGH (ref 65–99)
POTASSIUM: 2.9 mmol/L — AB (ref 3.5–5.1)
Potassium: 2.6 mmol/L — CL (ref 3.5–5.1)
Potassium: 2.7 mmol/L — CL (ref 3.5–5.1)
SODIUM: 169 mmol/L — AB (ref 135–145)
SODIUM: 168 mmol/L — AB (ref 135–145)
Sodium: 168 mmol/L (ref 135–145)

## 2015-09-05 LAB — I-STAT CG4 LACTIC ACID, ED: LACTIC ACID, VENOUS: 3.26 mmol/L — AB (ref 0.5–2.0)

## 2015-09-05 LAB — URINALYSIS, ROUTINE W REFLEX MICROSCOPIC
Bilirubin Urine: NEGATIVE
Hgb urine dipstick: NEGATIVE
Ketones, ur: NEGATIVE mg/dL
LEUKOCYTES UA: NEGATIVE
Nitrite: NEGATIVE
PH: 6 (ref 5.0–8.0)
Protein, ur: NEGATIVE mg/dL
SPECIFIC GRAVITY, URINE: 1.029 (ref 1.005–1.030)

## 2015-09-05 LAB — CBC WITH DIFFERENTIAL/PLATELET
Basophils Absolute: 0 10*3/uL (ref 0.0–0.1)
Basophils Relative: 0 %
Eosinophils Absolute: 0.2 10*3/uL (ref 0.0–0.7)
Eosinophils Relative: 2 %
HEMATOCRIT: 33.2 % — AB (ref 36.0–46.0)
HEMOGLOBIN: 9.7 g/dL — AB (ref 12.0–15.0)
LYMPHS ABS: 1.6 10*3/uL (ref 0.7–4.0)
LYMPHS PCT: 17 %
MCH: 26.6 pg (ref 26.0–34.0)
MCHC: 29.2 g/dL — AB (ref 30.0–36.0)
MCV: 91 fL (ref 78.0–100.0)
MONOS PCT: 6 %
Monocytes Absolute: 0.6 10*3/uL (ref 0.1–1.0)
NEUTROS ABS: 6.8 10*3/uL (ref 1.7–7.7)
NEUTROS PCT: 75 %
Platelets: 317 10*3/uL (ref 150–400)
RBC: 3.65 MIL/uL — AB (ref 3.87–5.11)
RDW: 19.4 % — ABNORMAL HIGH (ref 11.5–15.5)
WBC: 9.1 10*3/uL (ref 4.0–10.5)

## 2015-09-05 LAB — CBG MONITORING, ED
Glucose-Capillary: >600 mg/dL (ref 65–99)
Glucose-Capillary: >600 mg/dL (ref 65–99)

## 2015-09-05 LAB — LIPASE, BLOOD: LIPASE: 44 U/L (ref 11–51)

## 2015-09-05 LAB — MRSA PCR SCREENING: MRSA BY PCR: NEGATIVE

## 2015-09-05 LAB — I-STAT TROPONIN, ED: Troponin i, poc: 0 ng/mL (ref 0.00–0.08)

## 2015-09-05 LAB — URINE MICROSCOPIC-ADD ON: RBC / HPF: NONE SEEN RBC/hpf (ref 0–5)

## 2015-09-05 MED ORDER — POTASSIUM CHLORIDE 10 MEQ/100ML IV SOLN
10.0000 meq | INTRAVENOUS | Status: AC
  Administered 2015-09-05 (×2): 10 meq via INTRAVENOUS
  Filled 2015-09-05 (×2): qty 100

## 2015-09-05 MED ORDER — SODIUM CHLORIDE 0.9 % IV BOLUS (SEPSIS): 1000.0000 mL | Freq: Once | INTRAVENOUS | Status: DC

## 2015-09-05 MED ORDER — ACETAMINOPHEN 650 MG RE SUPP: 650.0000 mg | Freq: Four times a day (QID) | RECTAL | Status: DC | PRN

## 2015-09-05 MED ORDER — POTASSIUM CHLORIDE 2 MEQ/ML IV SOLN
INTRAVENOUS | Status: AC
  Administered 2015-09-05: 21:00:00 via INTRAVENOUS
  Filled 2015-09-05: qty 1000

## 2015-09-05 MED ORDER — INSULIN REGULAR BOLUS VIA INFUSION
0.0000 [IU] | Freq: Three times a day (TID) | INTRAVENOUS | Status: DC
  Filled 2015-09-05: qty 10

## 2015-09-05 MED ORDER — SODIUM CHLORIDE 0.45 % IV SOLN
INTRAVENOUS | Status: DC
  Administered 2015-09-05: 15:00:00 via INTRAVENOUS

## 2015-09-05 MED ORDER — SODIUM CHLORIDE 0.45 % IV SOLN
INTRAVENOUS | Status: DC
  Administered 2015-09-05: 19:00:00 via INTRAVENOUS

## 2015-09-05 MED ORDER — INSULIN GLARGINE 100 UNIT/ML ~~LOC~~ SOLN
5.0000 [IU] | Freq: Every day | SUBCUTANEOUS | Status: DC
  Administered 2015-09-05 – 2015-09-06 (×2): 5 [IU] via SUBCUTANEOUS
  Filled 2015-09-05 (×3): qty 0.05

## 2015-09-05 MED ORDER — KCL IN DEXTROSE-NACL 20-5-0.2 MEQ/L-%-% IV SOLN
INTRAVENOUS | Status: DC
  Filled 2015-09-05: qty 1000

## 2015-09-05 MED ORDER — ACETAMINOPHEN 325 MG PO TABS
650.0000 mg | ORAL_TABLET | Freq: Four times a day (QID) | ORAL | Status: DC | PRN
Start: 2015-09-05 — End: 2015-09-07

## 2015-09-05 MED ORDER — SODIUM CHLORIDE 0.9% FLUSH
3.0000 mL | Freq: Two times a day (BID) | INTRAVENOUS | Status: DC
  Administered 2015-09-05 – 2015-09-06 (×3): 3 mL via INTRAVENOUS

## 2015-09-05 MED ORDER — KCL IN DEXTROSE-NACL 20-5-0.2 MEQ/L-%-% IV SOLN
INTRAVENOUS | Status: DC
  Filled 2015-09-05 (×2): qty 1000

## 2015-09-05 MED ORDER — DEXTROSE-NACL 5-0.45 % IV SOLN: INTRAVENOUS | Status: DC

## 2015-09-05 MED ORDER — SODIUM CHLORIDE 0.9 % IV SOLN
INTRAVENOUS | Status: DC
  Administered 2015-09-05: 5.4 [IU]/h via INTRAVENOUS
  Filled 2015-09-05: qty 2.5

## 2015-09-05 MED ORDER — SODIUM CHLORIDE 0.9 % IV SOLN: INTRAVENOUS | Status: DC

## 2015-09-05 MED ORDER — ONDANSETRON HCL 4 MG PO TABS: 4.0000 mg | ORAL_TABLET | Freq: Four times a day (QID) | ORAL | Status: DC | PRN

## 2015-09-05 MED ORDER — SODIUM CHLORIDE 0.9% FLUSH
10.0000 mL | INTRAVENOUS | Status: DC | PRN
  Administered 2015-09-06: 10 mL
  Filled 2015-09-05: qty 40

## 2015-09-05 MED ORDER — ONDANSETRON HCL 4 MG/2ML IJ SOLN: 4.0000 mg | Freq: Four times a day (QID) | INTRAMUSCULAR | Status: DC | PRN

## 2015-09-05 MED ORDER — ALTEPLASE 2 MG IJ SOLR
2.0000 mg | Freq: Once | INTRAMUSCULAR | Status: AC
  Administered 2015-09-05: 2 mg
  Filled 2015-09-05: qty 2

## 2015-09-05 MED ORDER — DEXTROSE 50 % IV SOLN: 25.0000 mL | INTRAVENOUS | Status: DC | PRN

## 2015-09-05 MED ORDER — INSULIN ASPART 100 UNIT/ML ~~LOC~~ SOLN
0.0000 [IU] | SUBCUTANEOUS | Status: DC
  Administered 2015-09-05 – 2015-09-06 (×3): 3 [IU] via SUBCUTANEOUS
  Administered 2015-09-06: 2 [IU] via SUBCUTANEOUS
  Administered 2015-09-06 – 2015-09-07 (×5): 3 [IU] via SUBCUTANEOUS

## 2015-09-05 MED ORDER — SODIUM CHLORIDE 0.9 % IV SOLN
INTRAVENOUS | Status: DC
  Filled 2015-09-05: qty 2.5

## 2015-09-05 MED ORDER — DEXTROSE-NACL 5-0.45 % IV SOLN
INTRAVENOUS | Status: DC
  Administered 2015-09-05: 18:00:00 via INTRAVENOUS

## 2015-09-05 MED ORDER — POTASSIUM CHLORIDE 10 MEQ/100ML IV SOLN
10.0000 meq | INTRAVENOUS | Status: DC
  Administered 2015-09-05: 10 meq via INTRAVENOUS
  Filled 2015-09-05: qty 100

## 2015-09-05 MED ORDER — HEPARIN SODIUM (PORCINE) 5000 UNIT/ML IJ SOLN
5000.0000 [IU] | Freq: Three times a day (TID) | INTRAMUSCULAR | Status: DC
  Administered 2015-09-05 – 2015-09-07 (×6): 5000 [IU] via SUBCUTANEOUS
  Filled 2015-09-05 (×6): qty 1

## 2015-09-05 MED ORDER — SODIUM CHLORIDE 0.9% FLUSH
10.0000 mL | Freq: Two times a day (BID) | INTRAVENOUS | Status: DC
  Administered 2015-09-06: 10 mL
  Administered 2015-09-07: 20 mL

## 2015-09-05 MED ORDER — POTASSIUM CHLORIDE 10 MEQ/100ML IV SOLN
10.0000 meq | INTRAVENOUS | Status: AC
  Administered 2015-09-06 (×6): 10 meq via INTRAVENOUS
  Filled 2015-09-05 (×7): qty 100

## 2015-09-05 MED ORDER — SODIUM CHLORIDE 0.9 % IV BOLUS (SEPSIS)
1000.0000 mL | Freq: Once | INTRAVENOUS | Status: DC
  Administered 2015-09-05: 1000 mL via INTRAVENOUS

## 2015-09-05 NOTE — Progress Notes (Addendum)
[facility] ED AM CM received voice message from [facility] AM ED CM from [facility] CM [name] about pt arrival to ED and had been [facility] on Placement Updated ED SW via Mobile  SW consult entered in [place] after pt ED encounter summary reviewed

## 2015-09-05 NOTE — Progress Notes (Signed)
Per discussion with Dr. Vanessa BarbaraZamora, insulin drip stopped and D5-0.45NS stopped. Patient now Q4H CBG check, per order. Will continue to monitor.

## 2015-09-05 NOTE — ED Notes (Signed)
Dr Ranae PalmsYelverton aware of elevated Lactic

## 2015-09-05 NOTE — Consult Note (Signed)
   [facility] Consult   2016/01/04  Ave Filternn C Coffel 1950/11/18 [id number] Call received by [facility] RN [facility] Coordinator alerting of patient to arrive at ED.  This Clinical research associatewriter notified the ED Care Manager via voicemail of the patient being active with [facility] to make aware of patient.  Please contact for questions and follow up:  Charlesetta ShanksVictoria Farran Amsden, RN BSN CCM [facility] Liaison  [phone] business mobile phone Toll free office [phone]

## 2015-09-05 NOTE — ED Notes (Signed)
IV team working on PICC line at the moment -  Per Dr Ranae PalmsYelverton, can hold off on drawing BMP for right now.

## 2015-09-05 NOTE — Progress Notes (Signed)
1756 Dr. Vanessa BarbaraZamora paged regarding patient heart rhythm of ventricular bigeminy/trigeminy sustained. Will continue to monitor.

## 2015-09-05 NOTE — ED Notes (Signed)
Asked If any RN can do ultrasound IV on patient to gain IV access.  Ultrasound currently tied up in another room at this time.

## 2015-09-05 NOTE — Progress Notes (Signed)
Potassium runs have completed. Called CCMD and verified patient was no longer in Ventricular bigeminy. Will continue to monitor.

## 2015-09-05 NOTE — Progress Notes (Signed)
CM noted CM consult for FYI-PATIENT USING THE SERVICES OF [facility] PATIENT OUTREACH PATIENT HAS A PEG TUBE, 2 DRAINS AND A PICC LINE CM spoke with pt and family at bedside about Contact [facility] with [facility] to continue to monitor and assist her for d/c needs Pt confirmed [facility] was providing her services Pt agreed to have [facility] her choice of [facility] health agency and to follow for d/c needs  Jorja Loaim of [facility] updated via text

## 2015-09-05 NOTE — ED Notes (Signed)
Bed: WA01 Expected date:  Expected time:  Means of arrival:  Comments: EMS- [name].Potts64yo F, weakness/CBG>600

## 2015-09-05 NOTE — ED Notes (Signed)
Per EMS pt from home c/o hyperglycemia and weakness that increased since yesterday. Pt has PICC line in LUE getting IV Unasyn 12g.  Pt is currently NPO and getting all nutrition through PEG tube.  Per EMS glucose >600 on their meter and  Hypotensive. [facility] but was too weak to get into car.

## 2015-09-05 NOTE — ED Provider Notes (Signed)
CSN: [id number]     Arrival date & time 09/28/2015  1015 History   First MD Initiated Contact with Patient September 28, 2015 1055     Chief Complaint  Patient presents with  . Fatigue  . Hyperglycemia     (Consider location/radiation/quality/duration/timing/severity/associated sxs/prior Treatment) HPI Patient presents with 2 days of generalized weakness. She was supposed to follow-up with [facility] today but was too weak to walk. She normally ambulates with a walker. She lives at home with her husband. 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 She has a G-tube for tube feedings and a PICC line in the left upper extremity for Unasyn. Denies any fever or chills. Denies chest or abdominal pain. She is currently taking nothing by mouth. No redness or tenderness at the site of the PICC line. No new lower extremity swelling or pain. Past Medical History  Diagnosis Date  . Olivopontocerebellar atrophy (HCC)     Mgmt Dr Vickey Huger  . Diabetes mellitus     Type II. Insulin dependent. Diabetic nephropathy.   History reviewed. No pertinent past surgical history. Family History  Problem Relation Age of Onset  . Kidney disease Mother   . Heart disease Father    Social History  Substance Use Topics  . Smoking status: Never Smoker   . Smokeless tobacco: None  . Alcohol Use: No   OB History    No data available     Review of Systems  Constitutional: Positive for activity change and fatigue. Negative for fever and chills.  Respiratory: Negative for cough and shortness of breath.   Cardiovascular: Negative for chest pain and leg swelling.  Gastrointestinal: Negative for nausea, vomiting, abdominal pain and diarrhea.  Genitourinary: Negative for dysuria and flank pain.  Musculoskeletal: Negative for back pain and neck pain.  Skin: Negative for rash and wound.  Neurological: Positive for weakness (generalized). Negative for dizziness, light-headedness, numbness and headaches.  All  other systems reviewed and are negative.     Allergies  Review of patient's allergies indicates no known allergies.  Home Medications   Prior to Admission medications   Medication Sig Start Date End Date Taking? Authorizing Provider  aspirin 81 MG tablet Take 81 mg by mouth daily.     Yes Historical Provider, MD  atorvastatin (LIPITOR) 10 MG tablet Take 10 mg by mouth daily. 03/19/15  Yes Historical Provider, MD  fenofibrate 160 MG tablet Take 160 mg by mouth daily. 02/12/15  Yes Historical Provider, MD  insulin glargine (LANTUS) 100 UNIT/ML injection Inject 5 Units into the skin at bedtime.    Yes Historical Provider, MD  lisinopril (PRINIVIL,ZESTRIL) 30 MG tablet Take 30 mg by mouth daily.   Yes Historical Provider, MD  loperamide (IMODIUM) 1 MG/5ML solution Take 5 mLs by mouth 4 (four) times daily as needed. 08/28/15  Yes Historical Provider, MD  metFORMIN (GLUCOPHAGE) 500 MG tablet Take 500 mg by mouth daily. 04/15/15  Yes Historical Provider, MD   BP 108/84 mmHg  Pulse 92  Temp(Src) 97.5 F (36.4 C) (Oral)  Resp 20  SpO2 97% Physical Exam  Constitutional: She is oriented to person, place, and time. She appears well-developed. No distress.  Patient is cachectic  HENT:  Head: Normocephalic and atraumatic.  Dry mucous membranes  Eyes: EOM are normal. Pupils are equal, round, and reactive to light.  Neck: Normal range of motion. Neck supple.  No meningismus  Cardiovascular: Normal rate and regular rhythm.   Pulmonary/Chest: Effort normal and breath sounds normal. No  respiratory distress. She has no wheezes. She has no rales. She exhibits no tenderness.  Abdominal: Soft. Bowel sounds are normal. She exhibits no distension and no mass. There is no tenderness. There is no rebound and no guarding.  G-tube in place. No redness or tenderness around the insertion site. Abdomen is soft and nontender.  Musculoskeletal: Normal range of motion. She exhibits no edema or tenderness.  Left  antecubital PICC line. No erythema or tenderness at the site of insertion. No lower extremity asymmetry or tenderness. Distal pulses are equal and intact.  Neurological: She is alert and oriented to person, place, and time.  /5 motor in all extremities. Sensation grossly intact. Patient is at times confused. Follows commands  Skin: Skin is warm and dry. No rash noted. No erythema.  Psychiatric: She has a normal mood and affect. Her behavior is normal.  Nursing note and vitals reviewed.   ED Course  Procedures (including critical care time) Labs Review Labs Reviewed  URINALYSIS, ROUTINE W REFLEX MICROSCOPIC (NOT AT [facility]) - Abnormal; Notable for the following:    Glucose, UA >1000 (*)    All other components within normal limits  CBC WITH DIFFERENTIAL/PLATELET - Abnormal; Notable for the following:    RBC 3.65 (*)    Hemoglobin 9.7 (*)    HCT 33.2 (*)    MCHC 29.2 (*)    RDW 19.4 (*)    All other components within normal limits  COMPREHENSIVE METABOLIC PANEL - Abnormal; Notable for the following:    Sodium 166 (*)    Potassium 2.8 (*)    Chloride 124 (*)    Glucose, Bld 866 (*)    BUN 41 (*)    Creatinine, Ser 1.72 (*)    Calcium 10.8 (*)    Albumin 2.1 (*)    Alkaline Phosphatase 1510 (*)    GFR calc non Af Amer 30 (*)    GFR calc Af Amer 34 (*)    Anion gap 17 (*)    All other components within normal limits  BLOOD GAS, VENOUS - Abnormal; Notable for the following:    pH, Ven 7.374 (*)    Bicarbonate 25.9 (*)    All other components within normal limits  URINE MICROSCOPIC-ADD ON - Abnormal; Notable for the following:    Squamous Epithelial / LPF 0-5 (*)    Bacteria, UA RARE (*)    All other components within normal limits  CBG MONITORING, ED - Abnormal; Notable for the following:    Glucose-Capillary >600 (*)    All other components within normal limits  I-STAT CG4 LACTIC ACID, ED - Abnormal; Notable for the following:    Lactic Acid, Venous 3.26 (*)    All other  components within normal limits  CBG MONITORING, ED - Abnormal; Notable for the following:    Glucose-Capillary >600 (*)    All other components within normal limits  CULTURE, BLOOD (ROUTINE X 2)  CULTURE, BLOOD (ROUTINE X 2)  LIPASE, BLOOD  BASIC METABOLIC PANEL  I-STAT TROPOININ, ED    Imaging Review Dg Chest 2 View  09/22/2015  CLINICAL DATA:  PICC line placement EXAM: CHEST  2 VIEW COMPARISON:  05/20/2015 FINDINGS: Left PICC line is in place with the tip in the lower SVC just above the cavoatrial junction. Heart and mediastinal contours are within normal limits. No focal opacities or effusions. No acute bony abnormality. IMPRESSION: Left PICC line tip in the lower SVC. No active cardiopulmonary disease. Electronically Signed   By:  Charlett NoseKevin  Dover M.D.   On: 14-Jun-2015 11:51   I have personally reviewed and evaluated these images and lab results as part of my medical decision-making.   EKG Interpretation   Date/Time:  Thursday Sep 05 2015 11:47:09 EDT Ventricular Rate:  103 PR Interval:  145 QRS Duration: 112 QT Interval:  403 QTC Calculation: 528 R Axis:   60 Text Interpretation:  Sinus tachycardia Borderline intraventricular  conduction delay Repol abnrm, severe global ischemia (LM/MVD) Prolonged QT  interval Confirmed by Ranae PalmsYELVERTON  MD, Chaylee Ehrsam ([id number]) on 03/13/16 2:37:43 PM      MDM   Final diagnoses:  Hyperglycemia  Hypernatremia  Dehydration    Malnourished and dehydrated. CBG with blood sugar greater than 600. We'll start IV fluids and insulin drip. Suspect elevation lactic acid is due to dehydration. No evidence of infection. She is currently on IV Unasyn.  Patient with hyponatremia, hypokalemia, and hyperglycemia. Normal white blood cell count. Discussed with [facility] hospitalist. Will admit to step down bed.  Loren Raceravid Terrilyn Tyner, MD 2015/05/12 [phone]

## 2015-09-05 NOTE — ED Notes (Signed)
Attempted blood specimens from LUE PICC line, flushed with 30ml NS but unsuccessful at getting blood return

## 2015-09-05 NOTE — Progress Notes (Signed)
CRITICAL VALUE ALERT  Critical value received:  Na 169, Cl >130  Date of notification:  July 26, 2015  Time of notification:  1822  Critical value read back:Yes.    Nurse who received alert:  Rutha BouchardShelby Dorea Duff, RN  MD notified (1st page):  Dr. Vanessa BarbaraZamora  Time of first page:  1822  MD notified (2nd page):  Time of second page:  Responding MD:  Dr. Vanessa BarbaraZamora  Time MD responded:  [phone]

## 2015-09-05 NOTE — Patient Outreach (Signed)
Call received earlier this am from [place], [facility], concerning referral that this care manager received on 5/9.  [facility] and has a history of kidney disease, diabetes, umbilical hernia, and necrotizing pancreatitis.  According to chart, she was discharged with multiple care coordination needs, including drains, tube feedings, and IV antibiotics.  She has been received [facility] with [facility], but the care giver has expressed concern to Ms. Crystal regarding ability to adequately care for the member at home.    Crystal states that the member is currently on the way to the emergency department with complaints of weakness.  She state that she has discussed with the member and family about placement in order to provide adequate care.  She report that the family was hesitant to have the member placed.  Crystal and this care manger agrees that the subject of placement would be beneficial to the family again as she continues to have complications, and will shortly be evaluated in the ED.    Call then placed to hospital liaison, update provided, request made to contact case manager/social worker in the ED to address appropriate disposition for the member.  Will follow up with member once discharged.  [facility] referral.  Kemper DurieMonica Dominique Ressel, BSN, [facility] Care Management  [facility] Care [facility] [phone]

## 2015-09-05 NOTE — ED Notes (Signed)
This RN attempted IV x 1 attempt with no success.  Got verbal ok from Dr Ranae PalmsYelverton to get chest xray for PICC line placement confirmation and put in for IV team consult.

## 2015-09-05 NOTE — H&P (Signed)
History and Physical    Victoria Morrow ZOX:[id number] DOB: July 17, 1950 DOA: 09-28-15  Referring MD/NP/PA:  PCP: Maryelizabeth Rowan, MD  Outpatient Specialists:  Patient coming from: Home  Chief Complaint: Generalized weakness  HPI: Victoria Morrow is a 65 y.o. female with medical history insulin-dependent diabetes mellitus, admitted to [facility] on 04/28/2015 initially being treated for pancreatitis however developed sepsis with hypotension and required IV pressors support. 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 She was transferred to [facility] on 05/24/2015 to be evaluated by hepatobiliary surgery as well as gastroenterology. She underwent multiple endoscopic necrosectomies x 5 and cystgastrostomy with placement of peripancreatic drains x 2. She had been on TPN through a PICC. She had her last appointment with [facility] general surgery on 09/02/2015. Per care everywhere notes on empiric she was found to be doing well at the time as they reinforced fluid balance needs a continuation of TPN at the time. She presented to the emergency department with complaints of generalized weakness. Her husband states that in the last 24 hours she has had a significant functional decline, unable to ambulate, requiring assistance with all activities of daily living. Her husband states that she had been getting around with her walker prior to this decline. She has also had some increased confusion. She denies fevers, chills, nausea, vomiting, abdominal pain, cough, shortness of breath or chest pain.  ED Course: Lab work in the emergency department revealed a sodium of 166 with potassium of 2.8, creatinine of 1.72, blood sugar of 866. She was started on IV insulin. She was also given 3 runs of IV potassium  Review of Systems: As  per HPI otherwise 10 point review of systems negative.   Past Medical History  Diagnosis Date  . Olivopontocerebellar atrophy (HCC)     Mgmt Dr Vickey Huger  . Diabetes mellitus     Type II. Insulin dependent. Diabetic nephropathy.    History reviewed. No pertinent past surgical history.   reports that she has never smoked. She does not have any smokeless tobacco history on file. She reports that she does not drink alcohol or use illicit drugs.  No Known Allergies  Family History  Problem Relation Age of Onset  . Kidney disease Mother   . Heart disease Father     Prior to Admission medications   Medication Sig Start Date End Date Taking? Authorizing Provider  aspirin 81 MG tablet Take 81 mg by mouth daily.     Yes Historical Provider, MD  atorvastatin (LIPITOR) 10 MG tablet Take 10 mg by mouth daily. 03/19/15  Yes Historical Provider, MD  fenofibrate 160 MG tablet Take 160 mg by mouth daily. 02/12/15  Yes Historical Provider, MD  insulin glargine (LANTUS) 100 UNIT/ML injection Inject 5 Units into the skin at bedtime.    Yes Historical Provider, MD  lisinopril (PRINIVIL,ZESTRIL) 30 MG tablet Take 30 mg by mouth daily.   Yes Historical Provider, MD  loperamide (IMODIUM) 1 MG/5ML solution Take 5 mLs by mouth 4 (four) times daily as needed. 08/28/15  Yes Historical Provider, MD  metFORMIN (GLUCOPHAGE) 500 MG tablet Take 500 mg by mouth daily. 04/15/15  Yes Historical Provider, MD    Physical Exam: Filed Vitals:   09/28/2015 1028 28-Sep-2015 1242  BP: 108/67 104/70  Pulse: 98 102  Temp: 97.5 F (36.4 C)   TempSrc: Oral  Resp: 19 16  SpO2: 100% 99%      Constitutional: Thin, chronically ill-appearing, no acute distress, seems mildly confused Filed Vitals:   09/24/2015 1028 09/09/2015 1242  BP: 108/67 104/70  Pulse: 98 102  Temp: 97.5 F (36.4 C)   TempSrc: Oral   Resp: 19 16  SpO2: 100% 99%   Eyes: PERRL, lids and conjunctivae normal ENMT: Mucous membranes are moist. Posterior  pharynx clear of any exudate or lesions.Normal dentition. She has dry oral mucosa Neck: normal, supple, no masses, no thyromegaly Respiratory: clear to auscultation bilaterally, no wheezing, no crackles. Normal respiratory effort. No accessory muscle use.  Cardiovascular: Regular rate and rhythm, no murmurs / rubs / gallops. No extremity edema. 2+ pedal pulses. No carotid bruits.  Abdomen: There is a drainage catheter over posterior aspect of right flank, in addition there is a PEG tube in place. Musculoskeletal: no clubbing / cyanosis. No joint deformity upper and lower extremities. Sarcopeniapresent Skin: no rashes, lesions, ulcers. No induration Neurologic: CN 2-12 grossly intact. Sensation intact, DTR normal. Strength 5/5 in all 4.  Psychiatric: Appears mildly confused   Labs on Admission: I have personally reviewed following labs and imaging studies  CBC:  Recent Labs Lab 09/10/2015 1059  WBC 9.1  NEUTROABS 6.8  HGB 9.7*  HCT 33.2*  MCV 91.0  PLT 317   Basic Metabolic Panel:  Recent Labs Lab 09/01/2015 1059  NA 166*  K 2.8*  CL 124*  CO2 25  GLUCOSE 866*  BUN 41*  CREATININE 1.72*  CALCIUM 10.8*   GFR: CrCl cannot be calculated (Unknown ideal weight.). Liver Function Tests:  Recent Labs Lab 09/20/2015 1059  AST 26  ALT 35  ALKPHOS 1510*  BILITOT 0.7  PROT 6.8  ALBUMIN 2.1*    Recent Labs Lab 09/19/2015 1059  LIPASE 44   No results for input(s): AMMONIA in the last 168 hours. Coagulation Profile: No results for input(s): INR, PROTIME in the last 168 hours. Cardiac Enzymes: No results for input(s): CKTOTAL, CKMB, CKMBINDEX, TROPONINI in the last 168 hours. BNP (last 3 results) No results for input(s): PROBNP in the last 8760 hours. HbA1C: No results for input(s): HGBA1C in the last 72 hours. CBG:  Recent Labs Lab 08/28/2015 1038 09/04/2015 1250  GLUCAP >600* >600*   Lipid Profile: No results for input(s): CHOL, HDL, LDLCALC, TRIG, CHOLHDL, LDLDIRECT  in the last 72 hours. Thyroid Function Tests: No results for input(s): TSH, T4TOTAL, FREET4, T3FREE, THYROIDAB in the last 72 hours. Anemia Panel: No results for input(s): VITAMINB12, FOLATE, FERRITIN, TIBC, IRON, RETICCTPCT in the last 72 hours. Urine analysis:    Component Value Date/Time   COLORURINE YELLOW 09/01/2015 1228   APPEARANCEUR CLEAR 09/21/2015 1228   LABSPEC 1.029 09/22/2015 1228   PHURINE 6.0 08/28/2015 1228   GLUCOSEU >1000* 08/26/2015 1228   GLUCOSEU NEG mg/dL 45/40/9811 9147   HGBUR NEGATIVE 09/15/2015 1228   HGBUR trace-intact 04/11/2009 0854   BILIRUBINUR NEGATIVE 09/06/2015 1228   KETONESUR NEGATIVE 09/06/2015 1228   PROTEINUR NEGATIVE 09/11/2015 1228   UROBILINOGEN 0.2 04/11/2009 1815   NITRITE NEGATIVE 09/06/2015 1228   LEUKOCYTESUR NEGATIVE 09/13/2015 1228   Sepsis Labs: (procalcitonin:4,lacticidven:4) )No results found for this or any previous visit (from the past 240 hour(s)).   Radiological Exams on Admission: Dg Chest 2 View  09/10/2015  CLINICAL DATA:  PICC line placement EXAM: CHEST  2 VIEW COMPARISON:  05/20/2015 FINDINGS: Left PICC line is in place with the tip in the lower SVC just above  the cavoatrial junction. Heart and mediastinal contours are within normal limits. No focal opacities or effusions. No acute bony abnormality. IMPRESSION: Left PICC line tip in the lower SVC. No active cardiopulmonary disease. Electronically Signed   By: Charlett Nose M.D.   On: 09/20/2015 11:51    EKG: Independently reviewed.   Assessment/Plan Active Problems:   Protein-calorie malnutrition, moderate (HCC)   Chronic kidney disease (CKD), stage III (moderate)   Type 2 diabetes mellitus with diabetic nephropathy (HCC)   Hyperglycemia   AKI (acute kidney injury) (HCC)   Hypernatremia   Acute encephalopathy   Dehydration    1.  Hyperosmotic hyperglycemic state. Mrs. Bosso having a history of diabetes mellitus, had been on TPN in the outpatient  setting for her history of necrotizing pancreatitis, presenting with a steep functional decline in the past 24 hours. Lab work revealing a blood sugar of 866. Urinalysis did not reveal the presence of ketones, BMP showed a bicarbonate of 25.  Will place her on IV insulin per glucose stabilizer protocol. Provide IV fluid resuscitation with half-normal saline, plan to repeat a BMP this afternoon.  2.  Hypernatremia. Initial lab work showing a sodium of 166, I suspect secondary to profound dehydration. It appears that she had been on TPN at home due to history of necrotizing pancreatitis. Will treat with half-normal saline, repeat a BMP at 6 clock this evening.  3.  Acute kidney injury. Lab work showing a creatinine of 1.72, increased from 1.16 on 05/24/2015. Likely related to hypovolemia/profound dehydration. Providing half-normal saline given presenting sodium level of 166. Discontinue ACE inhibitor therapy. Follow-up on repeat BMP.   4.  History of necrotizing pancreatitis. Mrs. Sliwinski was treated for necrotizing pancreatitis at [facility] having prolonged hospitalization, undergoing necresectomies x5, multiple drain placements, eventually discharged on TPN through a PICC line. She appears to have a benign abdominal exam, white count normal, afebrile, hemodynamically stable. Her lipase was within normal limits at 44. Plan to hold TPN for now since she presents with a glucose of 866. Lab work however revealed an alkaline phosphatase of 1510 with total bilirubin of 0.7. Case discussed Dr. Chestine Spore of general surgery at [facility] who is familiar with [name]. 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 He recommended rehydrating her and maintaining strict nothing by mouth status. He feels that she will likely require skilled nursing facility placement after this hospitalization and they will follow her  up in the office. For now he states that they did not have any further procedures planned from a surgical standpoint and would not workup the abnormal alkaline phosphatase level.  5.  Hypokalemia. Likely related to profound dehydration as she presents with potassium of 2.8. She was given 3 rounds of potassium chloride in the emergency department, will give another 2 rounds of IV potassium now. Follow-up on repeat BMP.  6.  Functional decline/acute encephalopathy. Husband noting a steep functional decline in the last 24 hours, now unable to get out of bed having confusion. I suspect related to profound dehydration, hyperglycemia and electrolyte abnormalities. Will provide IV fluid resuscitation, replace electrolytes, IV insulin. Physical therapy consultation.  7.  History of hypertension. Due to soft blood pressures in the emergency department likely resulted from hypovolemia will hold lisinopril.    DVT prophylaxis: Subcutaneous heparin Code Status: Full code Family Communication: I spoke to her husband was present at bedside Disposition Plan: Anticipate she may  require greater than 2 nights hospitalization Consults called: [facility] Admission status: Will admit to step down unit   Jeralyn BennettZAMORA, Dareen Gutzwiller MD [facility] Pager [phone]- [phone]  If 7PM-7AM, please contact night-coverage [email or web address] Password [email or web address]  June 04, 2015, 1:34 PM

## 2015-09-05 NOTE — Progress Notes (Signed)
ED CM sent update email to [facility]

## 2015-09-05 NOTE — ED Notes (Signed)
Sarah RN with IV team stated that need to wait an hour before using PICC line.

## 2015-09-06 ENCOUNTER — Encounter: Payer: Self-pay | Admitting: *Deleted

## 2015-09-06 DIAGNOSIS — N183 Chronic kidney disease, stage 3 (moderate): Secondary | ICD-10-CM

## 2015-09-06 DIAGNOSIS — N179 Acute kidney failure, unspecified: Secondary | ICD-10-CM

## 2015-09-06 DIAGNOSIS — E860 Dehydration: Secondary | ICD-10-CM

## 2015-09-06 DIAGNOSIS — R739 Hyperglycemia, unspecified: Secondary | ICD-10-CM

## 2015-09-06 DIAGNOSIS — E43 Unspecified severe protein-calorie malnutrition: Secondary | ICD-10-CM | POA: Insufficient documentation

## 2015-09-06 LAB — PHOSPHORUS
Phosphorus: 3.1 mg/dL (ref 2.5–4.6)
Phosphorus: 3.1 mg/dL (ref 2.5–4.6)

## 2015-09-06 LAB — BASIC METABOLIC PANEL
Anion gap: 11 (ref 5–15)
BUN: 26 mg/dL — ABNORMAL HIGH (ref 6–20)
CALCIUM: 10.3 mg/dL (ref 8.9–10.3)
CO2: 25 mmol/L (ref 22–32)
CREATININE: 1.22 mg/dL — AB (ref 0.44–1.00)
Chloride: 129 mmol/L — ABNORMAL HIGH (ref 101–111)
GFR calc Af Amer: 52 mL/min — ABNORMAL LOW (ref >60)
GFR calc non Af Amer: 45 mL/min — ABNORMAL LOW (ref >60)
GLUCOSE: 211 mg/dL — AB (ref 65–99)
Potassium: 4.1 mmol/L (ref 3.5–5.1)
Sodium: 165 mmol/L (ref 135–145)

## 2015-09-06 LAB — SODIUM
SODIUM: 164 mmol/L — AB (ref 135–145)
Sodium: 166 mmol/L (ref 135–145)
Sodium: 162 mmol/L (ref 135–145)

## 2015-09-06 LAB — MAGNESIUM
Magnesium: 2.1 mg/dL (ref 1.7–2.4)
Magnesium: 2.1 mg/dL (ref 1.7–2.4)

## 2015-09-06 LAB — GLUCOSE, CAPILLARY
GLUCOSE-CAPILLARY: 189 mg/dL — AB (ref 65–99)
Glucose-Capillary: 146 mg/dL — ABNORMAL HIGH (ref 65–99)
Glucose-Capillary: 113 mg/dL — ABNORMAL HIGH (ref 65–99)
Glucose-Capillary: 118 mg/dL — ABNORMAL HIGH (ref 65–99)

## 2015-09-06 MED ORDER — CHLORHEXIDINE GLUCONATE 0.12 % MT SOLN
15.0000 mL | Freq: Two times a day (BID) | OROMUCOSAL | Status: DC
  Administered 2015-09-06 (×2): 15 mL via OROMUCOSAL
  Filled 2015-09-06: qty 15

## 2015-09-06 MED ORDER — CETYLPYRIDINIUM CHLORIDE 0.05 % MT LIQD: 7.0000 mL | Freq: Two times a day (BID) | OROMUCOSAL | Status: DC

## 2015-09-06 MED ORDER — SODIUM CHLORIDE 0.45 % IV SOLN
INTRAVENOUS | Status: DC
  Administered 2015-09-07: 05:00:00 via INTRAVENOUS

## 2015-09-06 MED ORDER — FAT EMULSION 20 % IV EMUL
120.0000 mL | INTRAVENOUS | Status: AC
  Administered 2015-09-06: 120 mL via INTRAVENOUS
  Filled 2015-09-06: qty 120

## 2015-09-06 MED ORDER — CLINIMIX/DEXTROSE (5/15) 5 % IV SOLN
INTRAVENOUS | Status: AC
  Administered 2015-09-06: 18:00:00 via INTRAVENOUS
  Filled 2015-09-06: qty 720

## 2015-09-06 MED ORDER — SODIUM CHLORIDE 0.45 % IV SOLN: INTRAVENOUS | Status: AC

## 2015-09-06 MED ORDER — MORPHINE SULFATE (PF) 2 MG/ML IV SOLN
2.0000 mg | INTRAVENOUS | Status: DC | PRN
  Administered 2015-09-06: 2 mg via INTRAVENOUS
  Filled 2015-09-06: qty 1

## 2015-09-06 NOTE — Progress Notes (Signed)
PROGRESS NOTE    Victoria Morrow  WUJ:[id number] DOB: 1951/02/12 DOA: 09/15/2015 PCP: Maryelizabeth Rowan, MD  Outpatient Specialists:  Brief Narrative:  65 y.o. female with medical history insulin-dependent diabetes mellitus, admitted to [facility] on 04/28/2015 initially being treated for pancreatitis however developed sepsis with hypotension and required IV pressors support. 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 She was transferred to [facility] on 05/24/2015 to be evaluated by hepatobiliary surgery as well as gastroenterology. She underwent multiple endoscopic necrosectomies x 5 and cystgastrostomy with placement of peripancreatic drains x 2. She had been on TPN through a PICC. She had her last appointment with [facility] general surgery on 09/02/2015. Per care everywhere notes on empiric she was found to be doing well at the time as they reinforced fluid balance needs a continuation of TPN at the time. She presented to the emergency department with complaints of generalized weakness. Her husband states that in the last 24 hours she has had a significant functional decline, unable to ambulate, requiring assistance with all activities of daily living. Her husband states that she had been getting around with her walker prior to this decline. She has also had some increased confusion. She denies fevers, chills, nausea, vomiting, abdominal pain, cough, shortness of breath or chest pain   Assessment & Plan:   Active Problems:   Protein-calorie malnutrition, moderate (HCC)   Chronic kidney disease (CKD), stage III (moderate)   Type 2 diabetes mellitus with diabetic nephropathy (HCC)   Hyperglycemia   AKI (acute kidney injury) (HCC)   Hypernatremia   Acute encephalopathy   Dehydration   Protein-calorie  malnutrition, severe   1. Hyperosmotic hyperglycemic state. Victoria Morrow having a history of diabetes mellitus, had been on TPN in the outpatient setting for her history of necrotizing pancreatitis, presenting with a steep functional decline in the past 24 hours. Presenting bloodwork revealing a blood sugar of 866. BMP showed a bicarbonate of 25. Glucose corrected with insulin gtt overnight. - Cont on SSI coverage - On Lantus 5 units  2. Hypernatremia. Initial lab work showing a sodium of 166, suspect secondary to profound dehydration. Pt had been on TPN at home due to history of necrotizing pancreatitis. Presenting calculated free water deficit of 2L. Will continue on 125cc/hr of 1/2NS  3. Acute kidney injury. Lab work showing a creatinine of 1.72, increased from 1.16 on 05/24/2015. Likely related to hypovolemia/profound dehydration. Providing half-normal saline given presenting sodium level of 166. Discontinue ACE inhibitor therapy. Continue hydration as tolerated. Follow serial BMET  4. History of necrotizing pancreatitis. Mrs. Storts was treated for necrotizing pancreatitis at [facility] having prolonged hospitalization, undergoing necresectomies x5, multiple drain placements, eventually discharged on TPN through a PICC line. She appears to have a benign abdominal exam, white count normal, afebrile, hemodynamically stable. Her lipase was within normal limits at 44. Plan to hold TPN for now since she presents with a glucose of 866. Lab work however revealed an alkaline phosphatase of 1510 with total bilirubin of 0.7. Dr. Vanessa Barbara had discussed case wtih Dr. Chestine Spore of general surgery at [facility] who is familiar with Victoria Morrow. 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 He recommended rehydrating her and maintaining strict nothing by mouth status. He feels that she will likely require  skilled  nursing facility placement after this hospitalization and they will follow her up in the office. For now he states that they did not have any further procedures planned from a surgical standpoint and would not workup the abnormal alkaline phosphatase level.   5. Hypokalemia. Likely related to profound dehydration as she presents with potassium of 2.8. Replaced  6. Functional decline/acute encephalopathy. Pt noted to have a steep functional decline in the last 24 hours prior to admission, now unable to get out of bed having confusion. Continue IV fluid resuscitation, replace electrolytes, IV insulin. Physical therapy consultation.  7. History of hypertension. Due to soft blood pressures in the emergency department likely resulted from hypovolemia will hold lisinopril.    DVT prophylaxis: Heparin subQ Code Status: Full Family Communication: Pt in room Disposition Plan: Uncertain at this time   Consultants:     Procedures:     Antimicrobials:      Subjective: Feels thirsty this AM  Objective: Filed Vitals:   09/06/15 1130 09/06/15 1200 09/06/15 1300 09/06/15 1400  BP:  138/88 127/83 134/80  Pulse:      Temp:      TempSrc:      Resp: Height:      Weight:      SpO2:        Intake/Output Summary (Last 24 hours) at 09/06/15 1501 Last data filed at 09/06/15 1400  Gross per 24 hour  Intake 1355.58 ml  Output    900 ml  Net 455.58 ml   Filed Weights   14-Sep-2015 1506  Weight: 43.6 kg (96 lb 1.9 oz)    Examination:  General exam: Appears calm and comfortable  Respiratory system: Clear to auscultation. Respiratory effort normal. Cardiovascular system: S1 & S2 heard, RRR.  Gastrointestinal system: Abdomen is nondistended, soft and nontender. No organomegaly or masses felt. Normal bowel sounds heard. Central nervous system: Alert and oriented. No focal neurological deficits. Extremities: Symmetric 5 x 5 power. Skin: No rashes, lesions or  ulcers Psychiatry: Judgement and insight appear normal. Mood & affect appropriate.     Data Reviewed: I have personally reviewed following labs and imaging studies  CBC:  Recent Labs Lab 09/14/15 1059  WBC 9.1  NEUTROABS 6.8  HGB 9.7*  HCT 33.2*  MCV 91.0  PLT 317   Basic Metabolic Panel:  Recent Labs Lab Sep 14, 2015 1059 09/14/2015 1359 09-14-2015 1745 Sep 14, 2015 2300 09/06/15 0825 09/06/15 1000  NA 166* 168* 169* 168* 165* 166*  K 2.8* 2.6* 2.9* 2.7* 4.1  --   CL 124* 124* >130* >130* 129*  --   CO2 --   GLUCOSE 866* 786* 286* 209* 211*  --   BUN 41* 40* 34* 32* 26*  --   CREATININE 1.72* 1.66* 1.23* 1.22* 1.22*  --   CALCIUM 10.8* 11.1* 10.3 10.3 10.3  --    GFR: Estimated Creatinine Clearance: 30.8 mL/min (by C-G formula based on Cr of 1.22). Liver Function Tests:  Recent Labs Lab Sep 14, 2015 1059  AST 26  ALT 35  ALKPHOS 1510*  BILITOT 0.7  PROT 6.8  ALBUMIN 2.1*    Recent Labs Lab 2015-09-14 1059  LIPASE 44   No results for input(s): AMMONIA in the last 168 hours. Coagulation Profile: No results for input(s): INR, PROTIME in the last 168 hours. Cardiac Enzymes: No results for input(s): CKTOTAL, CKMB, CKMBINDEX, TROPONINI in the last 168 hours. BNP (last 3 results) No results for input(s): PROBNP  in the last 8760 hours. HbA1C: No results for input(s): HGBA1C in the last 72 hours. CBG:  Recent Labs Lab 08-27-2015 2001 08-27-2015 2307 09/06/15 0344 09/06/15 0822 09/06/15 1133  GLUCAP 155* 177* 146* 189* 113*   Lipid Profile: No results for input(s): CHOL, HDL, LDLCALC, TRIG, CHOLHDL, LDLDIRECT in the last 72 hours. Thyroid Function Tests: No results for input(s): TSH, T4TOTAL, FREET4, T3FREE, THYROIDAB in the last 72 hours. Anemia Panel: No results for input(s): VITAMINB12, FOLATE, FERRITIN, TIBC, IRON, RETICCTPCT in the last 72 hours. Urine analysis:    Component Value Date/Time   COLORURINE YELLOW 11-14-2015 1228    APPEARANCEUR CLEAR 11-14-2015 1228   LABSPEC 1.029 11-14-2015 1228   PHURINE 6.0 11-14-2015 1228   GLUCOSEU >1000* 11-14-2015 1228   GLUCOSEU NEG mg/dL 16/10/960412/16/2010 [id number]   HGBUR NEGATIVE 11-14-2015 1228   HGBUR trace-intact 04/11/2009 0854   BILIRUBINUR NEGATIVE 11-14-2015 1228   KETONESUR NEGATIVE 11-14-2015 1228   PROTEINUR NEGATIVE 11-14-2015 1228   UROBILINOGEN 0.2 04/11/2009 1815   NITRITE NEGATIVE 11-14-2015 1228   LEUKOCYTESUR NEGATIVE 11-14-2015 1228   Sepsis Labs:  Recent Labs Lab 08-27-2015 1143  LATICACIDVEN 3.26*    Recent Results (from the past 240 hour(s))  Culture, blood (Routine X 2) w Reflex to ID Panel     Status: None (Preliminary result)   Collection Time: 08-27-2015 11:30 AM  Result Value Ref Range Status   Specimen Description BLOOD BLOOD RIGHT HAND  Final   Special Requests BOTTLES DRAWN AEROBIC ONLY 5ML  Final   Culture PENDING  Incomplete   Report Status PENDING  Incomplete  MRSA PCR Screening     Status: None   Collection Time: 08-27-2015  3:14 PM  Result Value Ref Range Status   MRSA by PCR NEGATIVE NEGATIVE Final    Comment:        The GeneXpert MRSA Assay (FDA approved for NASAL specimens only), is one component of a comprehensive MRSA colonization surveillance program. It is not intended to diagnose MRSA infection nor to guide or monitor treatment for MRSA infections.          Radiology Studies: Dg Chest 2 View  05/13/2015  CLINICAL DATA:  PICC line placement EXAM: CHEST  2 VIEW COMPARISON:  05/20/2015 FINDINGS: Left PICC line is in place with the tip in the lower SVC just above the cavoatrial junction. Heart and mediastinal contours are within normal limits. No focal opacities or effusions. No acute bony abnormality. IMPRESSION: Left PICC line tip in the lower SVC. No active cardiopulmonary disease. Electronically Signed   By: Charlett NoseKevin  Dover M.D.   On: 11-14-2015 11:51        Scheduled Meds: . antiseptic oral rinse  7 mL Mouth Rinse  [id number]  . chlorhexidine  15 mL Mouth Rinse BID  . heparin  5,000 Units Subcutaneous Q8H  . insulin aspart  0-15 Units Subcutaneous Q4H  . insulin glargine  5 Units Subcutaneous QHS  . sodium chloride flush  10-40 mL Intracatheter Q12H  . sodium chloride flush  3 mL Intravenous Q12H   Continuous Infusions: . sodium chloride 125 mL/hr at 09/06/15 0757     LOS: 1 day     Berklie Dethlefs, Scheryl MartenSTEPHEN K, MD [facility] Pager [phone] If 7PM-7AM, please contact night-coverage [email or web address] Password [facility][email or web address] 09/06/2015, 3:01 PM

## 2015-09-06 NOTE — Evaluation (Signed)
Physical Therapy Evaluation Patient Details Name: Victoria Filternn C Nau MRN: [id number] DOB: 21-Sep-1950 Today's Date: 09/06/2015   History of Present Illness  Victoria Morrow is a 65 y.o. female with medical history insulin-dependent diabetes mellitus, necrotizing pancreatitis with liquefaction,  [facility] . She underwent multiple endoscopic necrosectomies x 5 and cystgastrostomy with placement of peripancreatic drains x 2. She had been on TPN through a PICC. She had her [facility] on 09/02/2015. Per care everywhere notes on empiric she was found to be doing well at the time as they reinforced fluid balance needs a continuation of TPN at the time. She presented to the emergency department 09/03/2015 with complaints of generalized weakness, Significant functional decline, confusion, unable to ambulate, requiring assistance with all activities of daily living.Per husband ambulatory with a RW.  Clinical Impression  The  Patient tolerated transfer to recliner with max assist. The patient is very weak. The patient will benefit from PT while in acute care to address the problems listed in the note below.     Follow Up Recommendations SNF;Supervision/Assistance - 24 hour;Home health PT (unless she improves enough  in strength/mobility to DC to home.Marland Kitchen. )    Equipment Recommendations  None recommended by PT    Recommendations for Other Services OT consult     Precautions / Restrictions Precautions Precautions: Fall Precaution Comments: gastostomy tube      Mobility  Bed Mobility Overal bed mobility: Needs Assistance Bed Mobility: Rolling;Sidelying to Sit Rolling: Mod assist Sidelying to sit: Max assist;HOB elevated       General bed mobility comments: THE PATIENT IS VERY WEAK  TO ROLL AND REQUIRED ASSIST TO RAISE THE TRUNK INTO UPRIGHT POSTURE.  Transfers Overall transfer level: Needs assistance Equipment  used: 2 person hand held assist Transfers: Sit to/from Stand Sit to Stand: Mod assist;+2 physical assistance;+2 safety/equipment Stand pivot transfers: Max assist;+2 safety/equipment;+2 physical assistance       General transfer comment: support  at trunk, very small shufflemsteps to  mobilize to recliner. required trunk support tfor balance/steady assist.  Ambulation/Gait                Stairs            Wheelchair Mobility    Modified Rankin (Stroke Patients Only)       Balance Overall balance assessment: Needs assistance Sitting-balance support: Bilateral upper extremity supported;Feet supported Sitting balance-Leahy Scale: Poor   Postural control: Posterior lean;Right lateral lean Standing balance support: During functional activity;Bilateral upper extremity supported Standing balance-Leahy Scale: Poor                               Pertinent Vitals/Pain Pain Assessment: Faces Faces Pain Scale: Hurts little more Pain Location: ABDOMEN Pain Descriptors / Indicators: Discomfort Pain Intervention(s): Limited activity within patient's tolerance;Monitored during session    Home Living Family/patient expects to be discharged to:: Private residence Living Arrangements: Spouse/significant other Available Help at Discharge: Family Type of Home: House       Home Layout: One level Home Equipment: Environmental consultantWalker - 2 wheels Additional Comments: unable to obtain all info from patient. Per chart , ambulaTORY  WITH rw    Prior Function Level of Independence: Needs assistance               Hand Dominance        Extremity/Trunk Assessment  Upper Extremity Assessment: Generalized weakness           Lower Extremity Assessment: Generalized weakness         Communication      Cognition Arousal/Alertness: Awake/alert Behavior During Therapy: WFL for tasks assessed/performed Overall Cognitive Status: Within Functional Limits for tasks  assessed                      General Comments      Exercises        Assessment/Plan    PT Assessment Patient needs continued PT services  PT Diagnosis Difficulty walking;Generalized weakness;Acute pain   PT Problem List Decreased strength;Decreased range of motion;Decreased activity tolerance;Decreased balance;Decreased mobility;Decreased knowledge of use of DME;Decreased safety awareness;Decreased knowledge of precautions  PT Treatment Interventions DME instruction;Gait training;Functional mobility training;Therapeutic activities;Therapeutic exercise;Patient/family education   PT Goals (Current goals can be found in the Care Plan section) Acute Rehab PT Goals Patient Stated Goal: to eat something.  PT Goal Formulation: With patient Time For Goal Achievement: 09/20/15 Potential to Achieve Goals: Good    Frequency Min 3X/week   Barriers to discharge        Co-evaluation               End of Session Equipment Utilized During Treatment: Gait belt Activity Tolerance: Patient limited by fatigue Patient left: in chair;with call bell/phone within reach;with chair alarm set Nurse Communication: Mobility status         Time: 0956-1010 PT Time Calculation (min) (ACUTE ONLY): 14 min   Charges:   PT Evaluation $PT Eval Low Complexity: 1 Procedure PT Treatments $Gait Training: 8-22 mins   PT G Codes:        Rada Hay 09/06/2015, 12:37 PM Blanchard Kelch PT [phone]

## 2015-09-06 NOTE — Progress Notes (Signed)
PARENTERAL NUTRITION CONSULT NOTE - INITIAL  Pharmacy Consult for TPN Indication: chronic, intolerance to enteral feeding  No Known Allergies  Patient Measurements: Height: 5' 2"  (157.5 cm) Weight: 96 lb 1.9 oz (43.6 kg) IBW/kg (Calculated) : 50.1  Vital Signs: Temp: 98 F (36.7 C) (05/12 0800) Temp Source: Oral (05/12 0800) BP: 134/80 mmHg (05/12 1400) Pulse Rate: 100 (05/12 1000) Intake/Output from previous day: 05/11 0701 - 05/12 0700 In: 599.3 [I.V.:399.3; IV Piggyback:200] Out: 901 [Urine:151; Emesis/NG output:450] Intake/Output from this shift: Total I/O In: 756.3 [I.V.:756.3] Out: -   Labs:  Recent Labs  09/20/2015 1059  WBC 9.1  HGB 9.7*  HCT 33.2*  PLT 317     Recent Labs  09/15/2015 1059  09/12/2015 1745 08/29/2015 2300 09/06/15 0825 09/06/15 1000 09/06/15 1433  NA 166*  < > 169* 168* 165* 166* 164*  K 2.8*  < > 2.9* 2.7* 4.1  --   --   CL 124*  < > >130* >130* 129*  --   --   CO2 25  < > 25 27 25   --   --   GLUCOSE 866*  < > 286* 209* 211*  --   --   BUN 41*  < > 34* 32* 26*  --   --   CREATININE 1.72*  < > 1.23* 1.22* 1.22*  --   --   CALCIUM 10.8*  < > 10.3 10.3 10.3  --   --   PROT 6.8  --   --   --   --   --   --   ALBUMIN 2.1*  --   --   --   --   --   --   AST 26  --   --   --   --   --   --   ALT 35  --   --   --   --   --   --   ALKPHOS 1510*  --   --   --   --   --   --   BILITOT 0.7  --   --   --   --   --   --   < > = values in this interval not displayed. Estimated Creatinine Clearance: 30.8 mL/min (by C-G formula based on Cr of 1.22).    Recent Labs  09/06/15 0344 09/06/15 0822 09/06/15 1133  GLUCAP 146* 189* 113*    Medical History: Past Medical History  Diagnosis Date  . Olivopontocerebellar atrophy (HCC)     Mgmt Dr Brett Fairy  . Diabetes mellitus     Type II. Insulin dependent. Diabetic nephropathy.     Insulin Requirements: Lantus 5 units and 11 units of SSI used since last night  Current Nutrition: NPO  IVF:  1/2NS at 125 ml/hr  Central access: PICC single lumen, placed prior to this admission TPN start date: on TPN PTA, started earlier this year  ASSESSMENT  HPI: 10 yoF treated earlier this year for necrotizing pancreatitis at [facility] having prolonged hospitalization, undergoing necresectomies x5, multiple drain placements, eventually discharged on TPN through a PICC line. She was admitted with weakness, hyperosmotic hyperglycemic state, dehydration with electrolyte abnormalities and AKI.   Her lipase was within normal limits at 44. TPN was held due to significant hyperglycemia. MD ordered to resume TPN 5/12 since hyperglycemia improving.   Significant events:   Today, 09/06/2015:    Glucose - improved from admission but remain elevated.  Off insulin gtt.  Now, on Lantus 5 units daily (which is patient's PTA dose) and moderate scale SSI q4h.  Electrolytes - Na 164, K 4.1 (improved from 2.7 after replacement).  Added Magnesium and Phosphorus labs today, since pt has been on TPN PTA, do not anticipate that she is high risk for refeeding.  Renal - AKI, SCr 1.22 today, both SCr and BUN improving. Urine output volumes not recorded.  LFTs -  Alk phos elevated - per MD's note, the patient's general surgeon at [facility] would not workup the abnormal alk phos level and has no further surgical procedures planned  TGs - ordered for tomorrow AM  Prealbumin - ordered for tomorrow AM  NUTRITIONAL GOALS                                                                                             RD recs (5/12): 1310-1525 kcal (30-35 kcal/kg), 52-65 grams (1.2-1.5 grams/kg) Clinimix 5/15 at a goal rate of 50 ml/hr + 20% fat emulsion at 10 ml/hr to provide: 60 g/day protein, 1332 Kcal/day.  PLAN                                                                                                                          At 1800 today:  Given lab abnormalities and elevated CBGs, will note resume TPN at goal rate.  Will start at half of goal rate tonight and will advance to goal as tolerated.  Tonight, start Clinimix 5/15 (WITHOUT electrolytes) at 30 ml/hr.  Patient will likely need regular insulin added to the TPN - will start with 10 units of regular insulin in this first bag.  20% fat emulsion at 5 ml/hr.  TPN to contain standard multivitamins and trace elements.  Reduce IVF to 90 ml/hr.  Continue moderate scale SSI q4h for now.   TPN lab panels on Mondays & Thursdays.  TPN labs in AM.  F/u daily.   Hershal Coria 09/06/2015,3:44 PM

## 2015-09-06 NOTE — Progress Notes (Signed)
Initial Nutrition Assessment  DOCUMENTATION CODES:   Severe malnutrition in context of chronic illness, Underweight  INTERVENTION:  - Recommend: Glucerna 1.2 @ 50 mL/hr which will provide 1440 kcal, 72 grams of protein (will monitor as this is above estimated needs), and 966 mL free water. - RD will monitor for nutrition support initiation and associated needs  NUTRITION DIAGNOSIS:   Inadequate oral intake related to inability to eat as evidenced by NPO status.  GOAL:   Patient will meet greater than or equal to 90% of their needs  MONITOR:   TF tolerance, Weight trends, Labs, Skin, I & O's  REASON FOR ASSESSMENT:   Malnutrition Screening Tool  ASSESSMENT:   65 y.o. female with medical history insulin-dependent diabetes mellitus, admitted to [facility] on 04/28/2015 initially being treated for pancreatitis however developed sepsis with hypotension and required IV pressors support. 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 She was transferred to [facility] on 05/24/2015 to be evaluated by hepatobiliary surgery as well as gastroenterology. She underwent multiple endoscopic necrosectomies x 5 and cystgastrostomy with placement of peripancreatic drains x 2. She had been on TPN through a PICC. She had her last appointment with [facility] general surgery on 09/02/2015. Per care everywhere notes on empiric she was found to be doing well at the time as they reinforced fluid balance needs a continuation of TPN at the time. She presented to the emergency department with complaints of generalized weakness. Her husband states that in the last 24 hours she has had a significant functional decline, unable to ambulate, requiring assistance with all activities of daily living. Her husband states that she had been  getting around with her walker prior to this decline. She has also had some increased confusion.  Pt seen for MST. BMI indicates underweight status. Pt NPO since admission. She states that she is NPO at home and sometimes will consume ice chips but no other PO intakes. Pt with PICC which she states has recently only been used for medications. She also has a PEG which is used for all nutrition provision. She states that she was provided with Glucerna 1.5 which ran continuously over 24 hours/day but she is unsure of the rate; not available in the chart.  Physical assessment shows moderate and severe muscle wasting, moderate and severe fat wasting, no edema. Pt reports that she experiences ankle swelling when legs are not elevated. Pt states that when she was dx with necrotizing pancreatitis and originally made NPO she began to lose weight. She states a total weight loss of 30 lbs and is unsure if she has recently lost any more weight or gained weight. Per chart review, pt has lost 36 lbs (27% body weight) in the past 5 months which is significant for time frame.   TF recommendations outlined above. Pt denies abdominal pain or nausea at this time and denies ever having abdominal pain or nausea with continuous TF at home PTA. Medications reviewed; 6 runs of K today. IVF: 1/2 NS @ 125 mL/hr. Labs reviewed; CBG this AM: 146 mg/dL, BUN/creatinine elevated, GFR: 52.   Diet Order:  Diet NPO time specified  Skin:  Reviewed, no issues  Last BM:  5/11  Height:   Ht Readings from Last 1 Encounters:  2015-09-09  (1.575 m)    Weight:   Wt Readings from Last 1  Encounters:  09/12/2015 96 lb 1.9 oz (43.6 kg)    Ideal Body Weight:  50 kg (kg)  BMI:  Body mass index is 17.58 kg/(m^2).  Estimated Nutritional Needs:   Kcal:  1310-1525 (30-35 kcal/kg)  Protein:  52-65 grams (1.2-1.5 grams/kg)  Fluid:  1.5 L/day  EDUCATION NEEDS:   No education needs identified at this time     Victoria GammonJessica  Marna Morrow, RD, LDN Inpatient Clinical Dietitian Pager # [phone] After hours/weekend pager # [phone]

## 2015-09-07 ENCOUNTER — Inpatient Hospital Stay (HOSPITAL_COMMUNITY): Payer: Commercial Managed Care - HMO | Admitting: Certified Registered Nurse Anesthetist

## 2015-09-07 DIAGNOSIS — K922 Gastrointestinal hemorrhage, unspecified: Secondary | ICD-10-CM

## 2015-09-07 DIAGNOSIS — D62 Acute posthemorrhagic anemia: Secondary | ICD-10-CM

## 2015-09-07 LAB — DIFFERENTIAL
Basophils Absolute: 0 10*3/uL (ref 0.0–0.1)
Basophils Relative: 0 %
EOS PCT: 6 %
Eosinophils Absolute: 0.6 10*3/uL (ref 0.0–0.7)
LYMPHS ABS: 1.5 10*3/uL (ref 0.7–4.0)
LYMPHS PCT: 16 %
MONO ABS: 0.6 10*3/uL (ref 0.1–1.0)
Monocytes Relative: 6 %
NEUTROS ABS: 7 10*3/uL (ref 1.7–7.7)
Neutrophils Relative %: 72 %

## 2015-09-07 LAB — COMPREHENSIVE METABOLIC PANEL
ALK PHOS: 1068 U/L — AB (ref 38–126)
ALT: 23 U/L (ref 14–54)
AST: 25 U/L (ref 15–41)
Albumin: 1.6 g/dL — ABNORMAL LOW (ref 3.5–5.0)
Anion gap: 9 (ref 5–15)
BUN: 19 mg/dL (ref 6–20)
CALCIUM: 10.5 mg/dL — AB (ref 8.9–10.3)
CO2: 24 mmol/L (ref 22–32)
CREATININE: 1.06 mg/dL — AB (ref 0.44–1.00)
Chloride: 127 mmol/L — ABNORMAL HIGH (ref 101–111)
GFR, EST NON AFRICAN AMERICAN: 53 mL/min — AB (ref >60)
Glucose, Bld: 195 mg/dL — ABNORMAL HIGH (ref 65–99)
Potassium: 4.3 mmol/L (ref 3.5–5.1)
Sodium: 160 mmol/L — ABNORMAL HIGH (ref 135–145)
Total Bilirubin: 0.3 mg/dL (ref 0.3–1.2)
Total Protein: 5.5 g/dL — ABNORMAL LOW (ref 6.5–8.1)

## 2015-09-07 LAB — CBC
HCT: 28.7 % — ABNORMAL LOW (ref 36.0–46.0)
HEMATOCRIT: 22.6 % — AB (ref 36.0–46.0)
HEMOGLOBIN: 8.7 g/dL — AB (ref 12.0–15.0)
HEMOGLOBIN: 6.9 g/dL — AB (ref 12.0–15.0)
MCH: 27.1 pg (ref 26.0–34.0)
MCH: 26.7 pg (ref 26.0–34.0)
MCHC: 30.3 g/dL (ref 30.0–36.0)
MCHC: 30.5 g/dL (ref 30.0–36.0)
MCV: 89.4 fL (ref 78.0–100.0)
MCV: 87.6 fL (ref 78.0–100.0)
Platelets: 250 10*3/uL (ref 150–400)
Platelets: 326 10*3/uL (ref 150–400)
RBC: 3.21 MIL/uL — AB (ref 3.87–5.11)
RBC: 2.58 MIL/uL — AB (ref 3.87–5.11)
RDW: 19.6 % — ABNORMAL HIGH (ref 11.5–15.5)
RDW: 19.5 % — ABNORMAL HIGH (ref 11.5–15.5)
WBC: 9.6 10*3/uL (ref 4.0–10.5)
WBC: 14.4 10*3/uL — AB (ref 4.0–10.5)

## 2015-09-07 LAB — GLUCOSE, CAPILLARY
Glucose-Capillary: 156 mg/dL — ABNORMAL HIGH (ref 65–99)
Glucose-Capillary: 163 mg/dL — ABNORMAL HIGH (ref 65–99)
Glucose-Capillary: 166 mg/dL — ABNORMAL HIGH (ref 65–99)
Glucose-Capillary: 173 mg/dL — ABNORMAL HIGH (ref 65–99)
Glucose-Capillary: 192 mg/dL — ABNORMAL HIGH (ref 65–99)

## 2015-09-07 LAB — SODIUM
SODIUM: 154 mmol/L — AB (ref 135–145)
Sodium: 154 mmol/L — ABNORMAL HIGH (ref 135–145)

## 2015-09-07 LAB — PREALBUMIN: PREALBUMIN: 11 mg/dL — AB (ref 18–38)

## 2015-09-07 LAB — TRIGLYCERIDES: Triglycerides: 172 mg/dL — ABNORMAL HIGH (ref <150)

## 2015-09-07 LAB — MAGNESIUM: MAGNESIUM: 2.1 mg/dL (ref 1.7–2.4)

## 2015-09-07 LAB — PHOSPHORUS: Phosphorus: 2.9 mg/dL (ref 2.5–4.6)

## 2015-09-07 MED ORDER — FAT EMULSION 20 % IV EMUL
120.0000 mL | INTRAVENOUS | Status: DC
  Filled 2015-09-07: qty 250

## 2015-09-07 MED ORDER — SODIUM CHLORIDE 0.9 % IV BOLUS (SEPSIS)
500.0000 mL | Freq: Once | INTRAVENOUS | Status: AC
  Administered 2015-09-07: 500 mL via INTRAVENOUS

## 2015-09-07 MED ORDER — NOREPINEPHRINE BITARTRATE 1 MG/ML IV SOLN
0.0000 ug/min | INTRAVENOUS | Status: DC
  Filled 2015-09-07: qty 4

## 2015-09-07 MED ORDER — TRACE MINERALS CR-CU-MN-SE-ZN 10-1000-500-60 MCG/ML IV SOLN
INTRAVENOUS | Status: DC
  Filled 2015-09-07: qty 960

## 2015-09-07 MED ORDER — SODIUM CHLORIDE 0.9 % IV SOLN: Freq: Once | INTRAVENOUS | Status: DC

## 2015-09-09 ENCOUNTER — Other Ambulatory Visit: Payer: Self-pay | Admitting: *Deleted

## 2015-09-09 ENCOUNTER — Encounter: Payer: Self-pay | Admitting: *Deleted

## 2015-09-09 NOTE — Patient Outreach (Signed)
[facility]) Care Management  09/09/2015  Victoria Morrow Apr 18, 1951 [id number]   CSW noted that patient is now deceased and sent a case closure letter to patient's Primary Care Physician, [name].  CSW also notified patient's RNCM with [facility], music, [place].  Case closure is performed. Danford Bad, BSW, MSW, LCSW  Licensed Restaurant manager, fast food [facility]  Mailing [place] [address] [place], [place] [address] Physical Address-[address], [place], [place] [address] Toll Free Main # [phone] Fax # [phone] Cell # [phone]  Fax # [phone]  [email or web address] [employer]  Discrimination is Against the [employer]. and its subsidiaries comply with applicable Federal civil rights laws and do not discriminate on the basis of race, color, national origin, age, disability, or sex. [employer]. and its subsidiaries do not exclude people or treat them differently because of race, color, national origin, age, disability, or sex.    [employer]. and its subsidiaries provide:  . Free auxiliary aids and services, such as qualified sign language interpreters, video remote interpretation, and written information in other formats to people with disabilities when such auxiliary aids and services are necessary to ensure an equal opportunity to participate. . Free language services to people whose primary language is not English when those services are necessary to provide meaningful access, such as translated documents or oral interpretation.    If you need these services, call [phone] or if you use a TTY, call 711.   If you believe that [employer]. and its subsidiaries have failed to provide these services or discriminated in another way on the basis of race, color, national origin, age, disability, or sex, you can file a Theatre manager with:    Discrimination Grievances  [address] [address]  [place], [place] [address]   If you need help filing a grievance, call [phone] or if you use a TTY, call 711.  You can also file a civil rights complaint with the U.S. Department of Health and Health and safety inspector, Office for Civil Rights electronically through the Office for Civil Rights Complaint Portal, available at [email or web address]-[email or web address], or by mail or phone at:   U.S. Department of Health and Human Services  [address], [place]  Room [phone], [facility] Building  [place], [place]. [address]  [phone], [phone] (TDD)  Complaint forms are available at [email or web address]             [facility] Multi-Language Interpreter Services  English: ATTENTION: If you do not speak English, language assistance services, free of charge, are available to you. Call [phone]  (TTY: 711).  Espaol (Spanish): ATENCIN: si habla espaol, tiene a su disposicin servicios gratuitos de asistencia lingstica. Llame al [phone] (TTY: 711).  ???? (Chinese): ?????????????????????????????? [phone] (TTY:711??  Ti?ng Vi?t (Vietnamese): CH : N?u b?n ni Ti?ng Vi?t, c cc d?ch v? h? tr? ngn ng? mi?n ph dnh cho b?n. G?i s? [phone] (TTY: 711).  ??? ([place]): ?? : ???? ????? ?? , ?? ?? ???? ??? ???? ? ???? . [phone] (TTY: 711)??? ??? ???? .  Tagalog (Tagalog - Filipino): PAUNAWA: Kung nagsasalita ka ng Tagalog, maaari kang gumamit ng mga serbisyo ng tulong sa wika nang walang bayad. [place] sa [phone] (TTY: 711).   [place]): :      ,      .  [phone] (: 711).  Kreyl Ayisyen (Jersey): ATANSYON: Si w pale Abe People, gen svis d pou lang ki disponib gratis pou ou.  Rele [phone] (TTY: 711).  Cherylann BanasFranais Marland Kitchen([place]): ATTENTION : Si vous parlez franais,  des services d'aide linguistique vous sont proposs gratuitement. Appelez le [phone] (ATS : 711).  Polski (Polish): UWAGA: Jeeli mwisz po polsku, moesz skorzysta z bezpatnej pomocy jzykowej. Zadzwo pod numer [phone] (TTY: 711).  Portugus (Tong[place]): ATENO: Se fala portugus, encontram-se disponveis servios lingusticos, grtis. Ligue para [phone] (TTY: 711).   Italiano ([place]): ATTENZIONE: In caso la lingua parlata sia l'italiano, sono disponibili servizi di assistenza linguistica gratuiti. Chiamare il numero [phone] (TTY: 711).  Tobie Lordseutsch ([place]rones[place]): ACHTUNG: Wenn Sie Deutsch sprechen, stehen Ihnen kostenlos sprachliche Hilfsdienstleistungen zur [employer]fgung. Rufnummer: [phone] (TTY: 711).   (Arabic): [phone]   .            : .)711 :   (  ??? (Japanese): ??????????????????????????????????[phone] ?TTY?711?????????????????  ? (Farsi): [phone]  . ?   ? ?  ? ? ?~ ?  ?    : .??  (TTY: 711)  Din Bizaad (Navajo): D77 baa ak0 n7n7zin: D77 saad bee y1n7[ti'go Jodell Ciproin Bizaad, saad bee 1k1'1n7da'1wo'd66', t'11 Donnetta Hailjiik'eh, 45 n1 h0l=, koj8' h0d77lnih [phone] (TTY: 711).

## 2015-09-10 LAB — CULTURE, BLOOD (ROUTINE X 2)
CULTURE: NO GROWTH
CULTURE: NO GROWTH

## 2015-09-26 NOTE — Progress Notes (Signed)
Patient noted to have soft BP today with sbp in the 70-80's. Patient noted to be nauseated and vomited blood clot. BP improved with fluids initially. However, BP noted to be labile and patient required further IVF resuscitation. CBC obtained. Pan-cultures obtained, pending. Later pt more hematemesis with bright bloody bowel movement. Hgb noted to be 6.9 at this time (was 8.7 this AM). 2 units PRBC's ordered. I consulted GI (Dr. Benson Norway), who recommends transfer to [facility] given recent multiple abdominal surgeries. Also discussed case with Critical Care (Dr. Halford Chessman). I contacted [facility] and spoke with Dr. Jolyne Loa, who accepted patient for ICU at [facility]. Patient re-assessed and noted to be poorly responsive. HR noted to slow and became asystole. Code Blue called. CPR in progress. See Code Blue documentation. Despite our valiant efforts, patient's condition did not improve. Met with pt's husband who states, "It's in God's hands. She's had enough pain." Pt's husband entered patient's room and stated to everyone in room "Stop what you're doing! Let her go!" at 1614. Further resuscitation efforts were stopped at this point. Patient did continue to have agonal breaths and faint pulses, husband was made aware. Husband requests to continue to keep patient comfortable with no further intervention. Patient officially pronounced at 75. Death summary to follow.

## 2015-09-26 NOTE — Progress Notes (Signed)
PARENTERAL NUTRITION CONSULT NOTE - Follow Up  Pharmacy Consult for TPN Indication: chronic, intolerance to enteral feeding  No Known Allergies  Patient Measurements: Height: 5' 2"  (157.5 cm) Weight: 96 lb 1.9 oz (43.6 kg) IBW/kg (Calculated) : 50.1  Vital Signs: Temp: 97.7 F (36.5 C) (05/13 0700) Temp Source: Oral (05/13 0700) BP: 119/80 mmHg (05/13 0600) Pulse Rate: 96 (05/13 0600) Intake/Output from previous day: 05/12 0701 - 05/13 0700 In: 2643 [I.V.:2221.3; TPN:421.8] Out: 500 [Urine:300; Emesis/NG output:150] Intake/Output from this shift:    Labs:  Recent Labs  09/11/2015 1059 2015-09-23 0445  WBC 9.1 9.6  HGB 9.7* 8.7*  HCT 33.2* 28.7*  PLT 317 250     Recent Labs  09/04/2015 1059  09/17/2015 2300 09/06/15 0825  09/06/15 1433 09/06/15 1750 09-23-2015 0445  NA 166*  < > 168* 165*  < > 164* 162* 160*  K 2.8*  < > 2.7* 4.1  --   --   --  4.3  CL 124*  < > >130* 129*  --   --   --  127*  CO2 25  < > 27 25  --   --   --  24  GLUCOSE 866*  < > 209* 211*  --   --   --  195*  BUN 41*  < > 32* 26*  --   --   --  19  CREATININE 1.72*  < > 1.22* 1.22*  --   --   --  1.06*  CALCIUM 10.8*  < > 10.3 10.3  --   --   --  10.5*  MG  --   --   --   --   --   --  2.1  2.1 2.1  PHOS  --   --   --   --   --   --  3.1  3.1 2.9  PROT 6.8  --   --   --   --   --   --  5.5*  ALBUMIN 2.1*  --   --   --   --   --   --  1.6*  AST 26  --   --   --   --   --   --  25  ALT 35  --   --   --   --   --   --  23  ALKPHOS 1510*  --   --   --   --   --   --  1068*  BILITOT 0.7  --   --   --   --   --   --  0.3  < > = values in this interval not displayed. Estimated Creatinine Clearance: 35.4 mL/min (by C-G formula based on Cr of 1.06).    Recent Labs  2015-09-23 0006 23-Sep-2015 0433 09-23-15 0713  GLUCAP 173* 156* 166*    Medical History: Past Medical History  Diagnosis Date  . Olivopontocerebellar atrophy (HCC)     Mgmt Dr Brett Fairy  . Diabetes mellitus     Type II. Insulin  dependent. Diabetic nephropathy.     Insulin Requirements: Lantus 5 units and 12 units of SSI used since TPN started 5/12 at 1800, 10 units/L insulin added to TPN  Current Nutrition: NPO  IVF: 1/2NS at 90 ml/hr  Central access: PICC single lumen, placed prior to this admission TPN start date: on TPN PTA, started earlier this year  ASSESSMENT  HPI: 31 yoF treated earlier this year for necrotizing pancreatitis at [facility] having prolonged hospitalization, undergoing necresectomies x5, multiple drain placements, eventually discharged on TPN through a PICC line. She was admitted with weakness, hyperosmotic hyperglycemic state, dehydration with electrolyte abnormalities and AKI.   Her lipase was within normal limits at 44. TPN was held due to significant hyperglycemia. MD ordered to resume TPN 5/12 since hyperglycemia improving.   Significant events:   Today, 09-28-2015:   Glucose - improved from admission but remain elevated above goal of 150 - range - 156-173 after TPN started with 10 units/L regular insulin added to TPN .  Off insulin gtt.  Now, on Lantus 5 units daily (which is patient's PTA dose) and moderate scale SSI q4h.  Electrolytes - Na 160 improved  Renal - AKI, SCr 1.06 today, both SCr and BUN improving. Little UOP  LFTs -  Alk phos elevated but this is chronic - per MD's note, the patient's general surgeon at [facility] would not workup the abnormal alk phos level and has no further surgical procedures planned  TGs - ordered for tomorrow AM  Prealbumin - ordered for tomorrow AM  NUTRITIONAL GOALS                                                                                             RD recs (5/12): 1310-1525 kcal (30-35 kcal/kg), 52-65 grams (1.2-1.5 grams/kg) Clinimix 5/15 at a goal rate of 50 ml/hr + 20% fat emulsion at 10 ml/hr to provide: 60 g/day protein,  1332 Kcal/day.  PLAN                                                                                                                         At 1800 today:  Will contine Clinimix 5/15 (WITHOUT electrolytes) and increase rate slowly towards goal as above and increase rate to 40 ml/hr.   Continue with 10 units of regular insulin in this bag as well.  Continue 20% fat emulsion at 5 ml/hr for now.  TPN to contain standard multivitamins and trace elements.  Reduce IVF to 80 ml/hr.  Continue moderate scale SSI q4h for now.   TPN lab panels on Mondays & Thursdays.  TPN labs in AM.  F/u daily.    Adrian Saran, PharmD, BCPS Pager [phone] 09-28-2015 9:00 AM

## 2015-09-26 NOTE — Progress Notes (Signed)
Chaplain paged to Victoria Morrow's room because she was coding. Chaplain stood with her husband as he began to process the grief of Victoria Gervase's death. As other relatives began to assemble they were provided grief care also. The family departed shortly.  Benjie Karvonenharles D. Sibel Khurana, DMin Chaplain

## 2015-09-26 NOTE — Discharge Summary (Signed)
Death Summary  Ave [address] [place] ZOX:[id number] DOB: 1950/11/23 DOA: 09/14/2015  PCP: Maryelizabeth RowanEWEY,ELIZABETH, MD  Admit date: 09/21/2015 Date of Death: February 04, 2016  Final Diagnoses:  Principal Problem:   Acute blood loss anemia Active Problems:   Protein-calorie malnutrition, moderate (HCC)   Chronic kidney disease (CKD), stage III (moderate)   Type 2 diabetes mellitus with diabetic nephropathy (HCC)   Hyperglycemia   AKI (acute kidney injury) (HCC)   Hypernatremia   Acute encephalopathy   Dehydration   Protein-calorie malnutrition, severe   Acute upper GI bleed   History of present illness:  65 y.o. female with medical history insulin-dependent diabetes mellitus, admitted [facility] on 04/28/2015 initially being treated for pancreatitis however developed sepsis with hypotension and required IV pressors support. 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 [facility] was [facility] on 05/24/2015 to be evaluated by hepatobiliary surgery as well as gastroenterology. She underwent multiple endoscopic necrosectomies x 5 and cystgastrostomy with placement of peripancreatic drains x 2. She had been on TPN through a PICC. She [facility] on [facility]/02/2015. Per care everywhere notes on empiric she was found to be doing well at the time as they reinforced fluid balance needs a continuation of TPN at the time. She presented to the emergency department with complaints of generalized weakness. Her husband states that in the last 24 hours she has had a significant functional decline, unable to ambulate, requiring assistance with all activities of daily living. Her husband states that she had been getting around with her walker prior to this decline. She has also had some increased  confusion. She initially denied fevers, chills, nausea, vomiting, abdominal pain, cough, shortness of breath or chest pain  Hospital Course:  1. Hyperosmotic hyperglycemic state. Presenting bloodwork revealing a blood sugar of 866. BMP showed a bicarbonate of 25. Glucose corrected with insulin gtt overnight. - Patient was continued on SSI coverage - Patient was continued on Lantus 5 units - Glucose remained stable  2. Hypernatremia. Initial lab work showing a sodium of 166, suspect secondary to profound dehydration. Pt had been on TPN at home due to history of necrotizing pancreatitis. Presenting calculated free water deficit of 2L. Patient was continued on 125cc/hr of 1/2NS  3. Acute kidney injury. Lab work showing a creatinine of 1.72, increased from 1.16 on 05/24/2015. Likely related to hypovolemia/profound dehydration. Providing half-normal saline given presenting sodium level of 166. Discontinue ACE inhibitor therapy. Continued hydration as tolerated.  4. History of necrotizing pancreatitis. Mrs. Gaye PollackSinclair was treated for necrotizing pancreatitis at [facility] having prolonged hospitalization, undergoing necresectomies x5, multiple drain placements, eventually discharged on TPN through a PICC line. Dr. Vanessa BarbaraZamora had discussed case wtih Dr. Chestine Sporelark of general surgery [facility], who is familiar with Mrs. Gaye PollackSinclair. 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 He recommended rehydrating her and maintaining strict nothing by mouth status.  5. Hypokalemia. Likely related to profound dehydration as she presents with potassium of 2.8. Replaced  6. Functional decline/acute encephalopathy. Pt noted to have a steep functional decline in the last 24 hours prior to admission.  7. History of hypertension.  8. Acute blood loss anemia. On 5/13, patient began to have hematemesis with resultant low  blood pressures that were initially responsive  to IVF. Follow up hemoglobin was 6.9 from 8.7 earlier in the day. Drain tubes began to drain gross blood. Case was discussed with GI and critical care who both recommended transfer to [facility]. Two units of blood ordered. Discussed case with Dr. Gertha Calkin at [facility] who accepted patient in transfer to MICU. Shortly thereafter, on re-evaluation, patient noted to become unresponsive and later developed asystole. Patient failed to recover from resuscitative efforts and patient's husband later requested no further intervention. Patient was later pronounced at 1659   Time of death: 08/26/57  Signed:  CHIU, Scheryl Marten  [facility] 09/06/2015, 6:46 PM

## 2015-09-26 DEATH — deceased

## 2015-09-27 ENCOUNTER — Other Ambulatory Visit: Payer: Self-pay | Admitting: *Deleted

## 2015-09-27 NOTE — Patient Outreach (Signed)
Late Entry:  Member deceased, case closed, care management date ended.  Kemper DurieMonica Jensine Luz, [place]RN, MSN [facility] Care Management  [facility] Care Manager [phone]

## 2016-01-10 IMAGING — CT CT ABD-PELV W/O CM
2 of 4 series · 16 of 46 positions shown, 18 images · non-contrast
Comparison: Abdominal ultrasound of 05/10/2014.  No prior CT.

CLINICAL DATA: Diarrhea and abdominal cramping for 3 hours. Blood
glucose 305. Abdominal pain.

EXAM:
CT ABDOMEN AND PELVIS WITHOUT CONTRAST
TECHNIQUE: Multidetector CT imaging of the abdomen and pelvis was performed
following the standard protocol without IV contrast.

[Series 2: abd/pel w/o · axial · non-contrast · 0.74mm/px · z∈[-379,+21]mm · 13 of 88 slices shown, 15 images]
[im 4/88  soft-tissue]
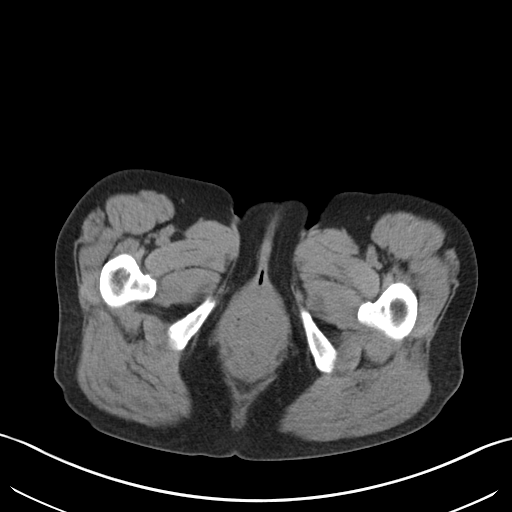
[im 4/88  bone]
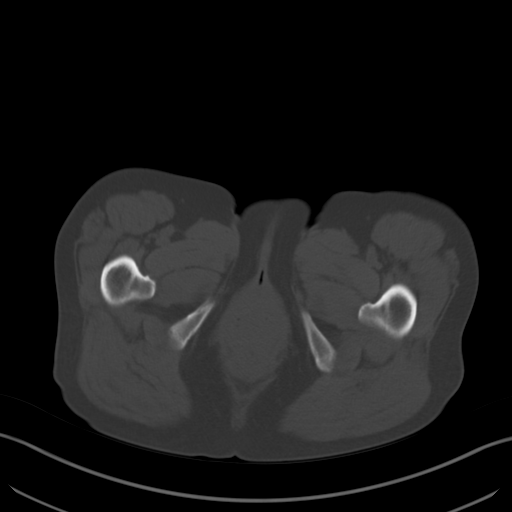
[im 11/88  soft-tissue]
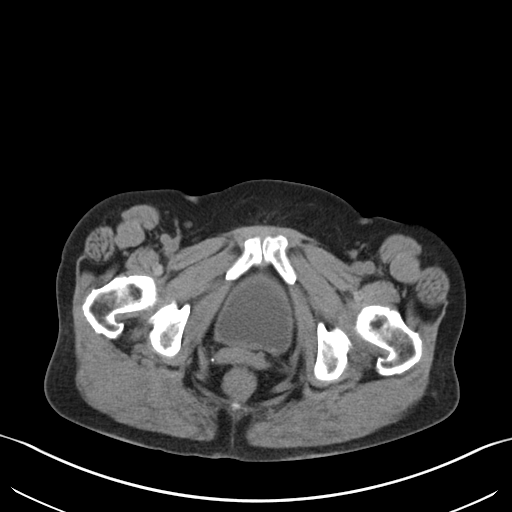
[im 18/88  soft-tissue]
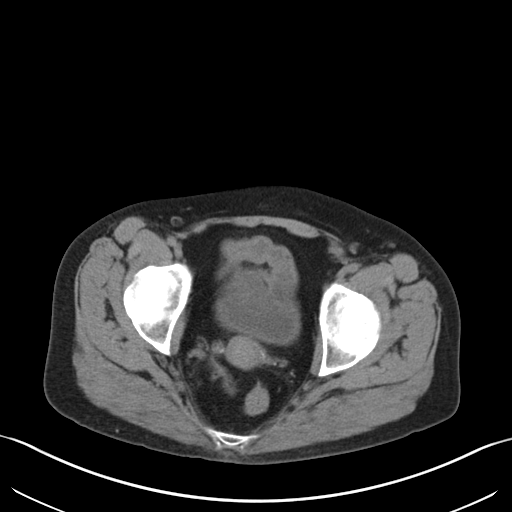
[im 25/88  soft-tissue]
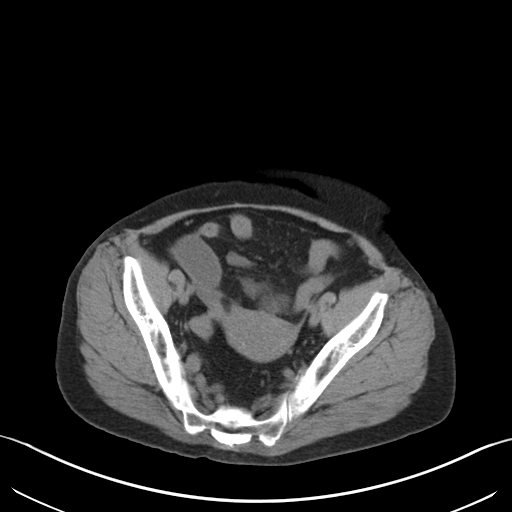
[im 32/88  soft-tissue]
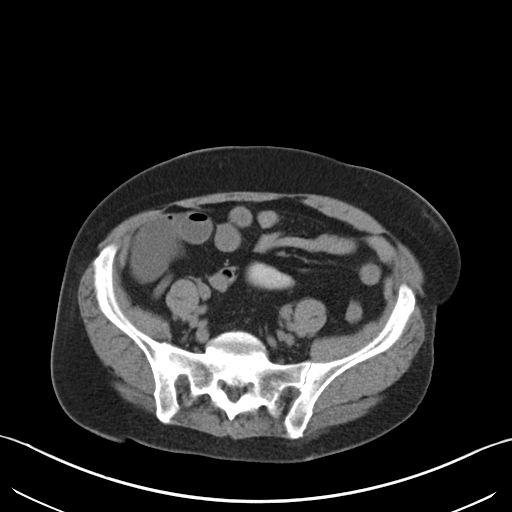
[im 39/88  soft-tissue]
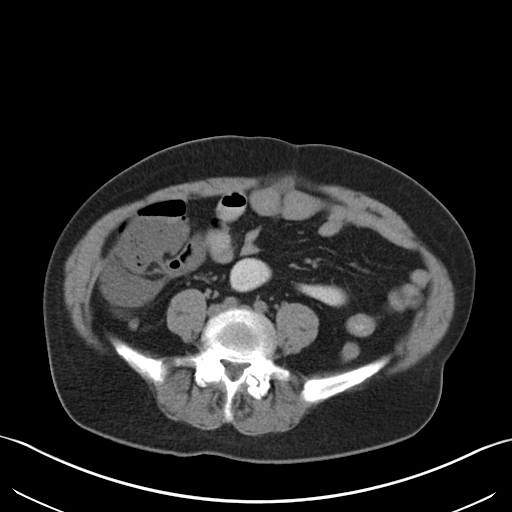
[im 46/88  soft-tissue]
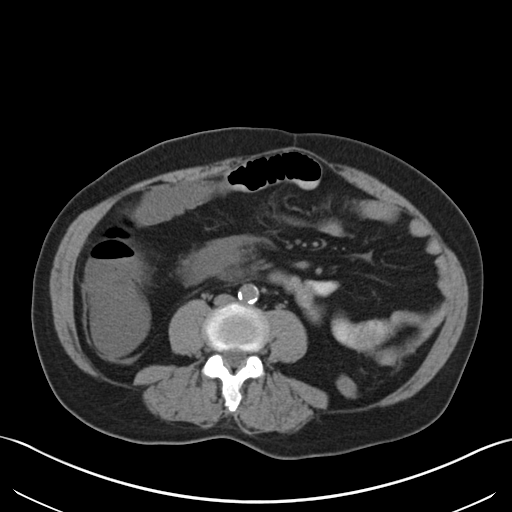
[im 49/88  soft-tissue]
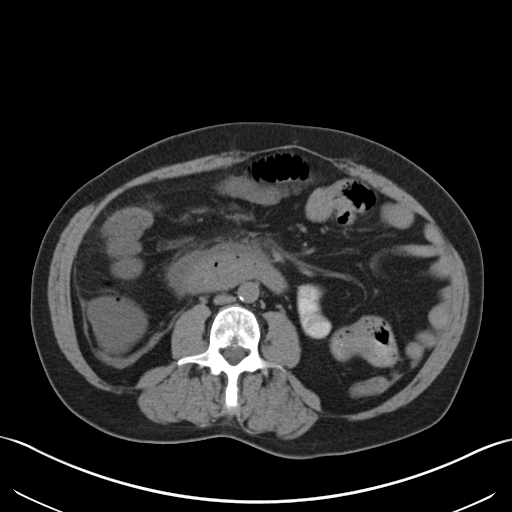
[im 56/88  soft-tissue]
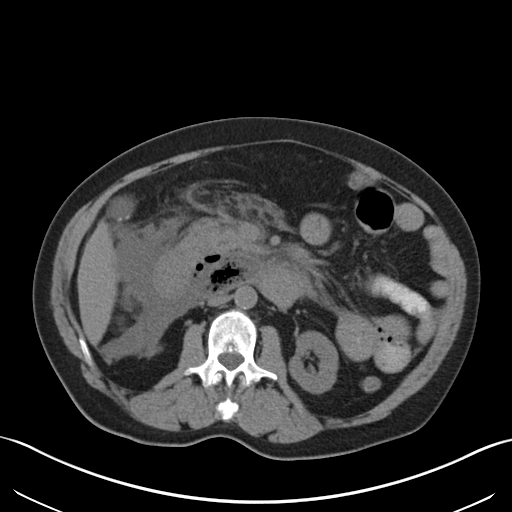
[im 56/88  bone]
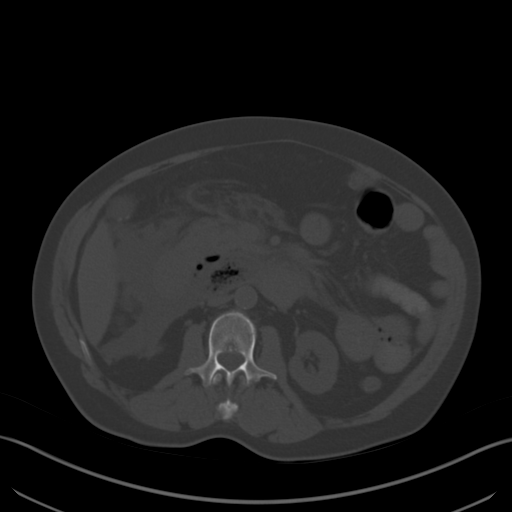
[im 63/88  soft-tissue]
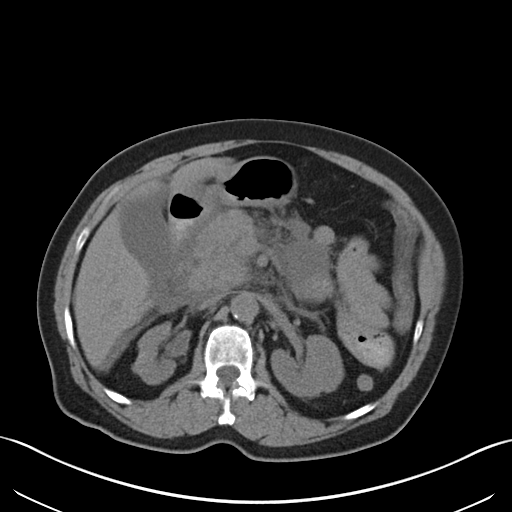
[im 70/88  soft-tissue]
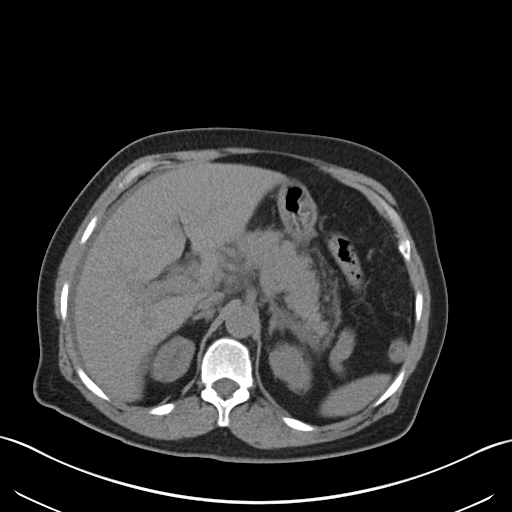
[im 77/88  soft-tissue]
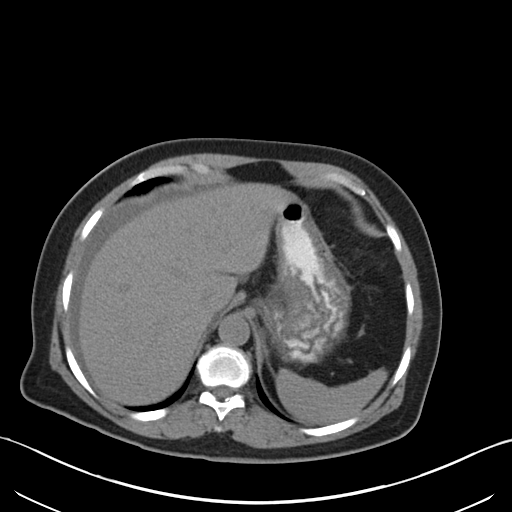
[im 84/88  soft-tissue]
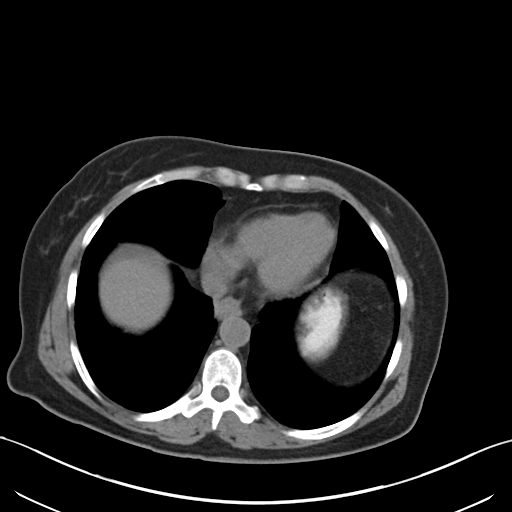

[Series 5: coronal · coronal · 0.75mm/px · 3 of 98 slices shown]
[im 33/98  soft-tissue]
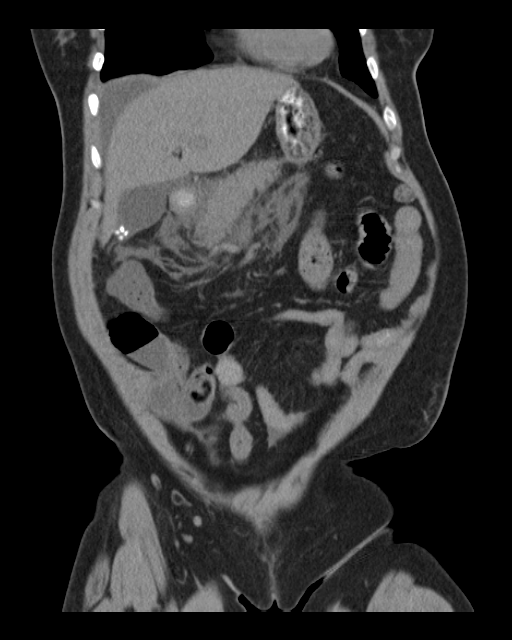
[im 44/98  soft-tissue]
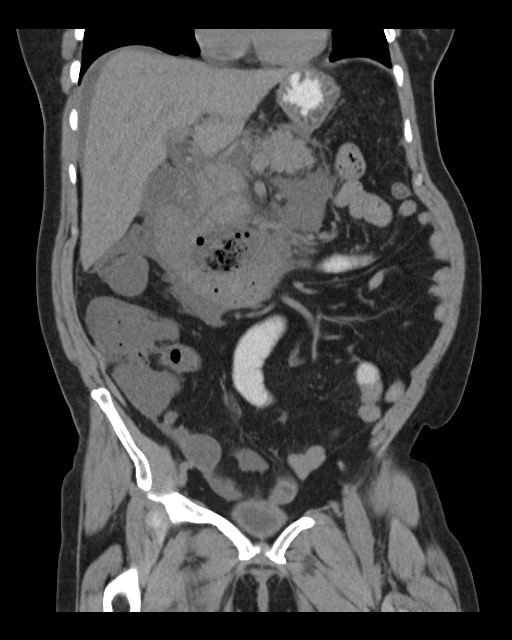
[im 54/98  soft-tissue]
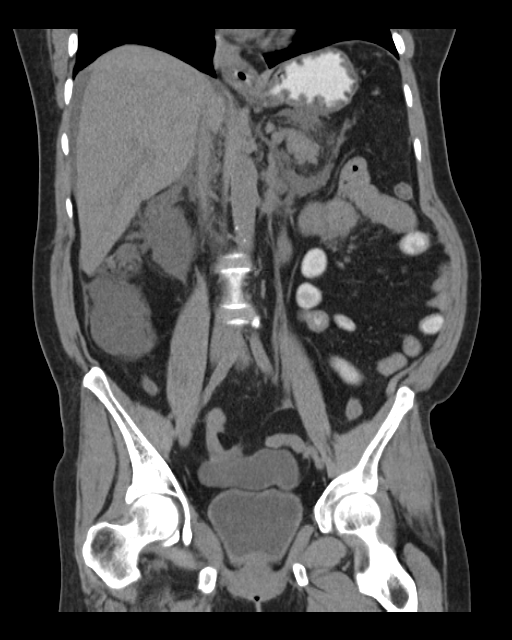

[16 of 46 positions shown; findings below may reference images not displayed]

FINDINGS: Lower chest: Clear lung bases. Normal heart size without pericardial
or pleural effusion. A tiny hiatal hernia.

Hepatobiliary: Normal noncontrast appearance of the liver.
Gallstones. No pericholecystic edema. No choledocholithiasis or
biliary duct dilatation.

Pancreas: Enlargement of the pancreatic neck, head, and uncinate
process. Peripancreatic edema, without well-defined collection. No
pancreatic duct dilatation.

Spleen: Normal in size, without focal abnormality.

Adrenals/Urinary Tract: Normal adrenal glands. Mild left and
moderate right renal cortical thinning. No hydronephrosis. No
hydroureter or ureteric calculi. No bladder calculi.

Stomach/Bowel: Normal remainder of the stomach. Normal colon and
terminal ileum. The appendix is mildly prominent, measuring up to 8
mm on image 58/ series 2. No periappendiceal inflammation. Favored
to be within normal variation.

Gas and soft tissue density cephalad the transverse duodenum on
image 36/ series 2 is favored to be related to a duodenal
diverticulum. No edema posterior to this area. Small bowel otherwise
unremarkable.

Vascular/Lymphatic: Aortic and branch vessel atherosclerosis. No
abdominopelvic adenopathy.

Reproductive: Normal uterus and adnexa.

Other: No free pelvic fluid. small volume perihepatic ascites. Mild
pelvic floor laxity.

Musculoskeletal: Prominent disc bulge at L4-5.
IMPRESSION: 1. Upper abdominal edema, eccentric right. Centered about the
pancreatic head, uncinate process, and neck. Favor pancreatitis.
Correlate with pancreatic enzymes (pending). If these are abnormal,
less likely differential consideration would include peptic ulcer
disease.
2. Cholelithiasis without specific evidence of cholecystitis or
biliary duct dilatation.
3. Trace perihepatic ascites, likely secondary.
4. Small hiatal hernia.
5. Pelvic floor laxity.
6. Renal atrophy.

## 2016-01-13 IMAGING — CR DG CHEST 1V PORT
1 series · 1 of 1 positions shown · non-contrast
Comparison: No priors.

CLINICAL DATA: 64-year-old female with acute respiratory failure
status post endotracheal tube placement.

EXAM:
PORTABLE CHEST 1 VIEW

[AP]
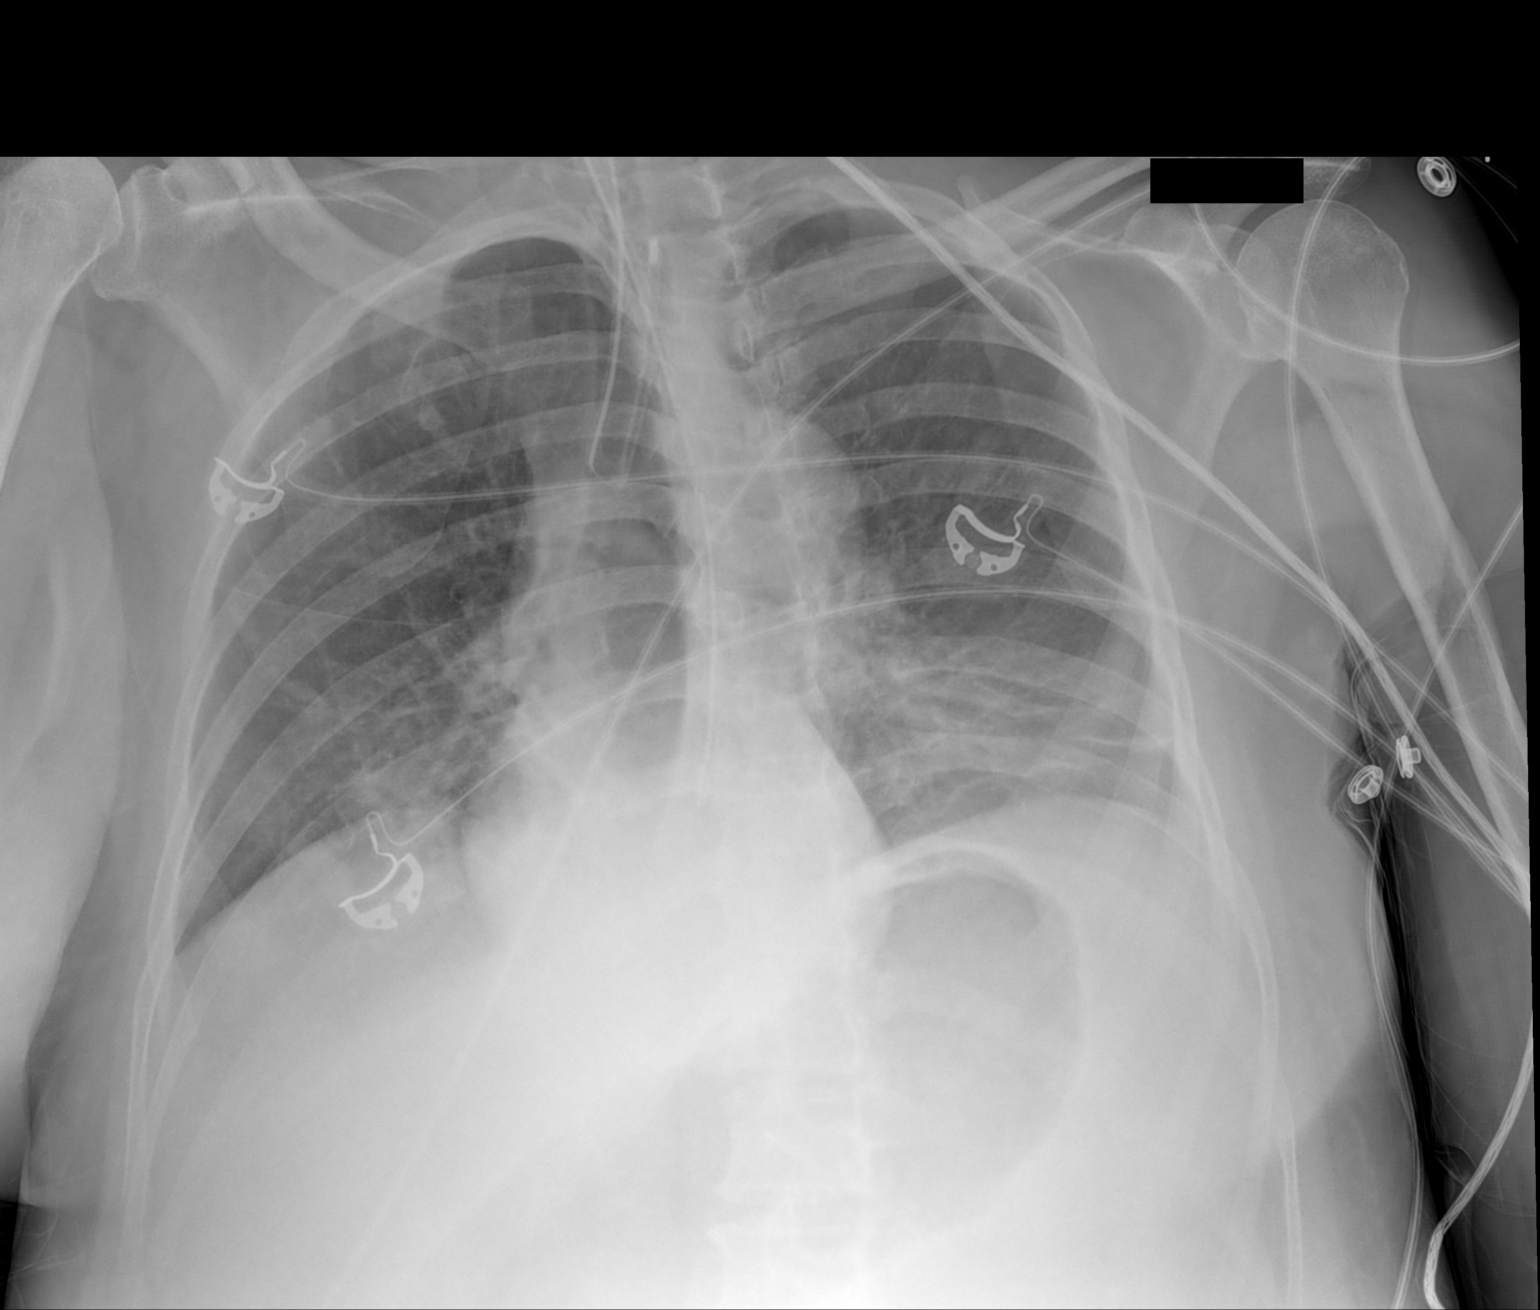

[1 of 1 positions shown; findings below may reference images not displayed]

FINDINGS: An endotracheal tube is in place with tip 1.7 cm above the carina.
Nasogastric tube extends into the distal third of the esophagus.
Lung volumes are low. Bibasilar opacities may reflect areas of
atelectasis and/or consolidation. Small left pleural effusion. No
evidence of pulmonary edema. Heart size is normal. The patient is
rotated to the right on today's exam, resulting in distortion of the
mediastinal contours and reduced diagnostic sensitivity and
specificity for mediastinal pathology. No pneumothorax.
IMPRESSION: 1. Support apparatus, as above. Endotracheal tube could be withdrawn
approximately 3 cm for more optimal placement, and a nasogastric
tube could be advanced at least 10-15 cm for more optimal placement.
2. Low lung volumes with bibasilar areas of atelectasis and/or
consolidation and small left pleural effusion.

## 2016-01-15 IMAGING — DX DG CHEST 1V PORT
1 series · 1 of 1 positions shown · non-contrast
Comparison: 05/01/2014

CLINICAL DATA: Follow-up respiratory failure.  Extubated.

EXAM:
PORTABLE CHEST 1 VIEW

[chest ap]
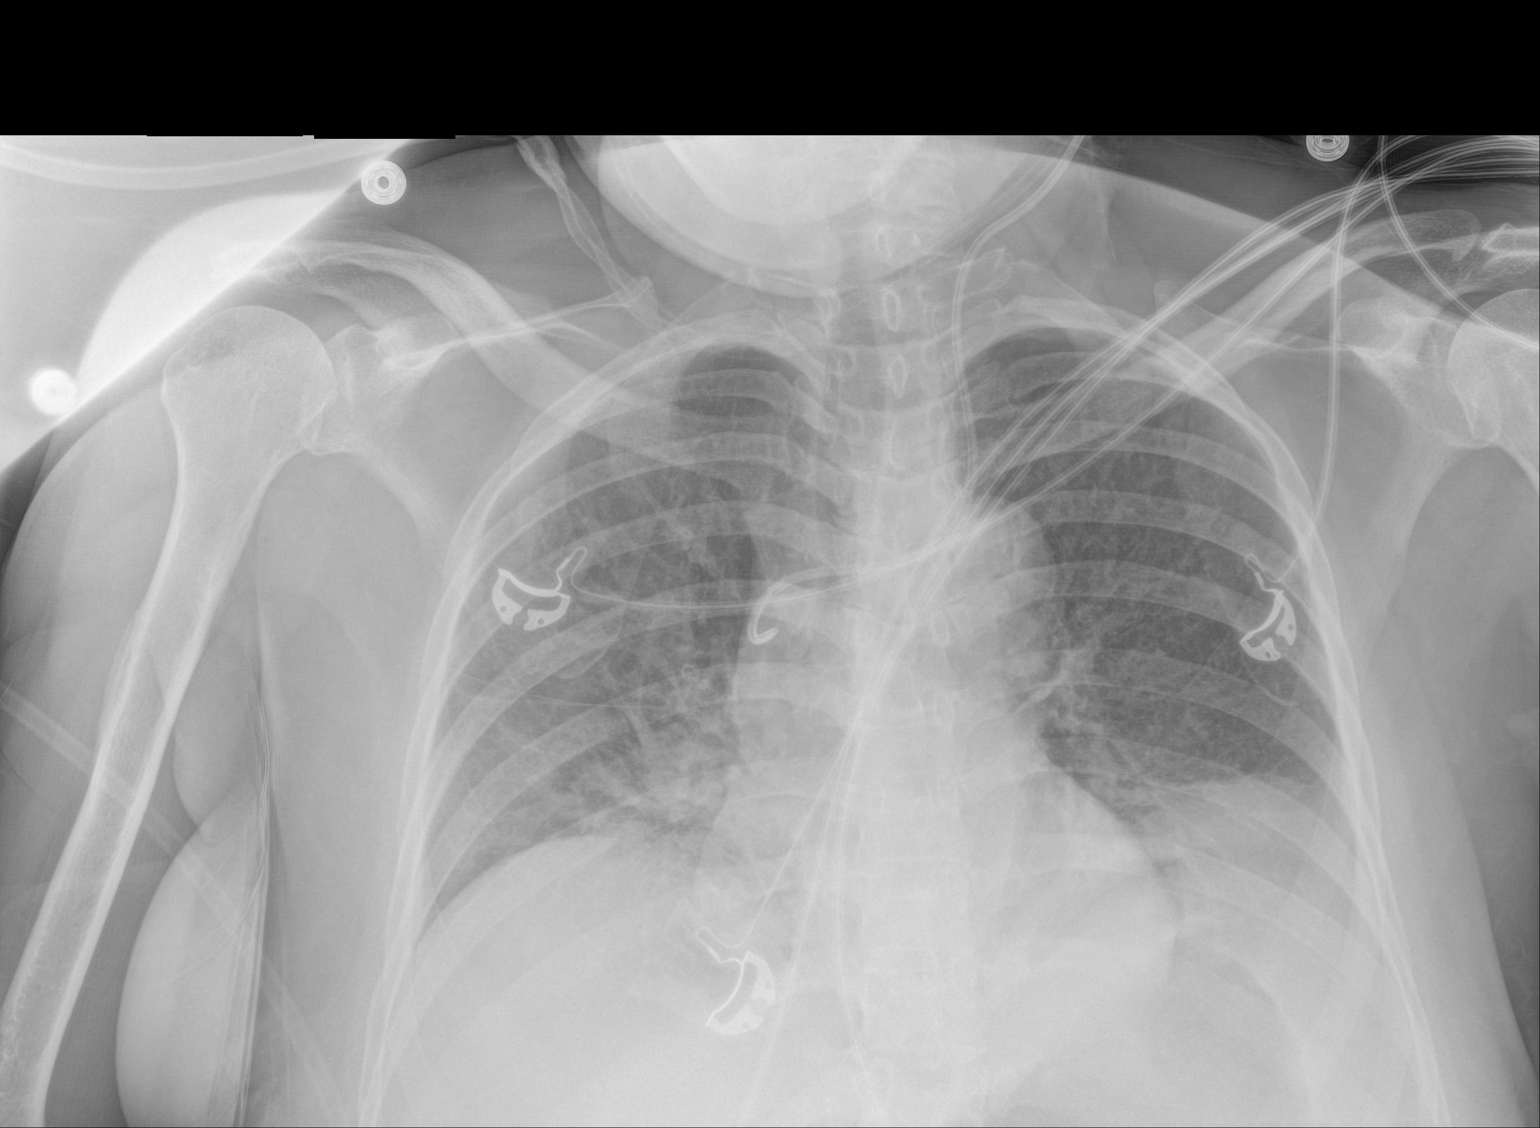

[1 of 1 positions shown; findings below may reference images not displayed]

FINDINGS: Endotracheal tube has been removed. Nasogastric tube is been
removed. Left internal jugular central line has withdrawn somewhat,
with the tip curled into the azygos vein. Patchy volume loss in the
right lower lobe has improved but does persist to a degree. Partial
collapse of left lower lobe is slightly improved. No worsening or
new findings otherwise.
IMPRESSION: Endotracheal tube and nasogastric tube removed.

Central line pulled back somewhat with the tip in the azygos vein.

Persistent volume loss in both lower lobes, with some improved
aeration since yesterday.
